# Patient Record
Sex: Female | Born: 1935
Health system: Southern US, Community
[De-identification: ages and names within clinical notes are randomized; demographics above are authoritative.]

## PROBLEM LIST (undated history)

## (undated) DIAGNOSIS — E860 Dehydration: Secondary | ICD-10-CM

## (undated) DIAGNOSIS — K219 Gastro-esophageal reflux disease without esophagitis: Secondary | ICD-10-CM

## (undated) DIAGNOSIS — R6521 Severe sepsis with septic shock: Secondary | ICD-10-CM

## (undated) DIAGNOSIS — I10 Essential (primary) hypertension: Secondary | ICD-10-CM

## (undated) DIAGNOSIS — I499 Cardiac arrhythmia, unspecified: Secondary | ICD-10-CM

## (undated) DIAGNOSIS — G459 Transient cerebral ischemic attack, unspecified: Secondary | ICD-10-CM

## (undated) DIAGNOSIS — B3781 Candidal esophagitis: Secondary | ICD-10-CM

## (undated) DIAGNOSIS — K229 Disease of esophagus, unspecified: Secondary | ICD-10-CM

## (undated) DIAGNOSIS — R197 Diarrhea, unspecified: Secondary | ICD-10-CM

## (undated) DIAGNOSIS — Z9889 Other specified postprocedural states: Secondary | ICD-10-CM

## (undated) DIAGNOSIS — M810 Age-related osteoporosis without current pathological fracture: Secondary | ICD-10-CM

## (undated) DIAGNOSIS — K222 Esophageal obstruction: Secondary | ICD-10-CM

## (undated) DIAGNOSIS — K5720 Diverticulitis of large intestine with perforation and abscess without bleeding: Secondary | ICD-10-CM

## (undated) DIAGNOSIS — M1990 Unspecified osteoarthritis, unspecified site: Secondary | ICD-10-CM

## (undated) DIAGNOSIS — T8859XA Other complications of anesthesia, initial encounter: Secondary | ICD-10-CM

## (undated) DIAGNOSIS — A419 Sepsis, unspecified organism: Secondary | ICD-10-CM

## (undated) DIAGNOSIS — R112 Nausea with vomiting, unspecified: Secondary | ICD-10-CM

## (undated) DIAGNOSIS — T4145XA Adverse effect of unspecified anesthetic, initial encounter: Secondary | ICD-10-CM

## (undated) DIAGNOSIS — C801 Malignant (primary) neoplasm, unspecified: Secondary | ICD-10-CM

## (undated) HISTORY — DX: Transient cerebral ischemic attack, unspecified: G45.9

## (undated) HISTORY — DX: Malignant (primary) neoplasm, unspecified: C80.1

## (undated) HISTORY — DX: Severe sepsis with septic shock: R65.21

## (undated) HISTORY — DX: Diarrhea, unspecified: R19.7

## (undated) HISTORY — DX: Sepsis, unspecified organism: A41.9

## (undated) HISTORY — DX: Age-related osteoporosis without current pathological fracture: M81.0

## (undated) HISTORY — PX: PAROTIDECTOMY W/ NECK DISSECTION TOTAL: SUR1004

## (undated) HISTORY — PX: TONSILLECTOMY: SUR1361

## (undated) HISTORY — DX: Disease of esophagus, unspecified: K22.9

## (undated) HISTORY — PX: ABDOMINAL HYSTERECTOMY: SHX81

## (undated) HISTORY — PX: APPENDECTOMY: SHX54

## (undated) HISTORY — DX: Essential (primary) hypertension: I10

## (undated) HISTORY — DX: Candidal esophagitis: B37.81

## (undated) HISTORY — DX: Unspecified osteoarthritis, unspecified site: M19.90

## (undated) HISTORY — DX: Dehydration: E86.0

## (undated) HISTORY — DX: Diverticulitis of large intestine with perforation and abscess without bleeding: K57.20

---

## 1997-09-04 ENCOUNTER — Ambulatory Visit (HOSPITAL_COMMUNITY): Admission: RE | Admit: 1997-09-04 | Discharge: 1997-09-04 | Payer: Self-pay | Admitting: Ophthalmology

## 1997-09-20 ENCOUNTER — Other Ambulatory Visit: Admission: RE | Admit: 1997-09-20 | Discharge: 1997-09-20 | Payer: Self-pay | Admitting: Obstetrics & Gynecology

## 1998-10-04 ENCOUNTER — Encounter: Payer: Self-pay | Admitting: Obstetrics and Gynecology

## 1998-10-04 ENCOUNTER — Ambulatory Visit (HOSPITAL_COMMUNITY): Admission: RE | Admit: 1998-10-04 | Discharge: 1998-10-04 | Payer: Self-pay | Admitting: Obstetrics and Gynecology

## 1999-04-15 ENCOUNTER — Encounter: Payer: Self-pay | Admitting: Obstetrics and Gynecology

## 1999-04-15 ENCOUNTER — Encounter: Admission: RE | Admit: 1999-04-15 | Discharge: 1999-04-15 | Payer: Self-pay | Admitting: Obstetrics and Gynecology

## 1999-08-16 ENCOUNTER — Other Ambulatory Visit: Admission: RE | Admit: 1999-08-16 | Discharge: 1999-08-16 | Payer: Self-pay | Admitting: Family Medicine

## 1999-10-01 ENCOUNTER — Encounter: Admission: RE | Admit: 1999-10-01 | Discharge: 1999-10-01 | Payer: Self-pay | Admitting: Family Medicine

## 1999-10-01 ENCOUNTER — Encounter: Payer: Self-pay | Admitting: Family Medicine

## 1999-12-09 ENCOUNTER — Encounter: Payer: Self-pay | Admitting: Gastroenterology

## 1999-12-09 ENCOUNTER — Ambulatory Visit (HOSPITAL_COMMUNITY): Admission: RE | Admit: 1999-12-09 | Discharge: 1999-12-09 | Payer: Self-pay | Admitting: Gastroenterology

## 2000-08-07 ENCOUNTER — Encounter: Admission: RE | Admit: 2000-08-07 | Discharge: 2000-08-07 | Payer: Self-pay | Admitting: Family Medicine

## 2000-08-07 ENCOUNTER — Encounter: Payer: Self-pay | Admitting: Family Medicine

## 2000-09-07 ENCOUNTER — Other Ambulatory Visit: Admission: RE | Admit: 2000-09-07 | Discharge: 2000-09-07 | Payer: Self-pay | Admitting: Obstetrics and Gynecology

## 2000-09-15 ENCOUNTER — Encounter: Payer: Self-pay | Admitting: Obstetrics and Gynecology

## 2000-09-15 ENCOUNTER — Encounter: Admission: RE | Admit: 2000-09-15 | Discharge: 2000-09-15 | Payer: Self-pay | Admitting: Obstetrics and Gynecology

## 2001-08-12 ENCOUNTER — Encounter: Admission: RE | Admit: 2001-08-12 | Discharge: 2001-08-12 | Payer: Self-pay | Admitting: Obstetrics and Gynecology

## 2001-08-12 ENCOUNTER — Encounter: Payer: Self-pay | Admitting: Obstetrics and Gynecology

## 2001-09-08 ENCOUNTER — Other Ambulatory Visit: Admission: RE | Admit: 2001-09-08 | Discharge: 2001-09-08 | Payer: Self-pay | Admitting: Obstetrics and Gynecology

## 2002-08-25 ENCOUNTER — Encounter: Payer: Self-pay | Admitting: Obstetrics and Gynecology

## 2002-08-25 ENCOUNTER — Encounter: Admission: RE | Admit: 2002-08-25 | Discharge: 2002-08-25 | Payer: Self-pay | Admitting: Obstetrics and Gynecology

## 2002-09-12 ENCOUNTER — Other Ambulatory Visit: Admission: RE | Admit: 2002-09-12 | Discharge: 2002-09-12 | Payer: Self-pay | Admitting: Obstetrics and Gynecology

## 2002-09-22 ENCOUNTER — Encounter: Admission: RE | Admit: 2002-09-22 | Discharge: 2002-09-22 | Payer: Self-pay | Admitting: Obstetrics and Gynecology

## 2002-09-22 ENCOUNTER — Encounter: Payer: Self-pay | Admitting: Obstetrics and Gynecology

## 2003-09-04 ENCOUNTER — Encounter: Admission: RE | Admit: 2003-09-04 | Discharge: 2003-09-04 | Payer: Self-pay | Admitting: Obstetrics and Gynecology

## 2003-10-25 ENCOUNTER — Encounter: Admission: RE | Admit: 2003-10-25 | Discharge: 2003-10-25 | Payer: Self-pay | Admitting: Gastroenterology

## 2003-11-21 ENCOUNTER — Ambulatory Visit (HOSPITAL_COMMUNITY): Admission: RE | Admit: 2003-11-21 | Discharge: 2003-11-21 | Payer: Self-pay | Admitting: Gastroenterology

## 2004-09-16 ENCOUNTER — Encounter: Admission: RE | Admit: 2004-09-16 | Discharge: 2004-09-16 | Payer: Self-pay | Admitting: Obstetrics and Gynecology

## 2005-09-19 ENCOUNTER — Encounter: Admission: RE | Admit: 2005-09-19 | Discharge: 2005-09-19 | Payer: Self-pay | Admitting: Obstetrics and Gynecology

## 2006-02-11 ENCOUNTER — Ambulatory Visit (HOSPITAL_COMMUNITY): Admission: RE | Admit: 2006-02-11 | Discharge: 2006-02-11 | Payer: Self-pay | Admitting: Gastroenterology

## 2006-10-07 ENCOUNTER — Encounter: Admission: RE | Admit: 2006-10-07 | Discharge: 2006-10-07 | Payer: Self-pay | Admitting: Obstetrics and Gynecology

## 2007-10-25 ENCOUNTER — Encounter: Admission: RE | Admit: 2007-10-25 | Discharge: 2007-10-25 | Payer: Self-pay | Admitting: Obstetrics and Gynecology

## 2008-10-25 ENCOUNTER — Encounter: Admission: RE | Admit: 2008-10-25 | Discharge: 2008-10-25 | Payer: Self-pay | Admitting: Obstetrics and Gynecology

## 2009-11-02 ENCOUNTER — Encounter: Admission: RE | Admit: 2009-11-02 | Discharge: 2009-11-02 | Payer: Self-pay | Admitting: Obstetrics and Gynecology

## 2010-04-14 ENCOUNTER — Encounter: Payer: Self-pay | Admitting: Gastroenterology

## 2010-07-15 ENCOUNTER — Ambulatory Visit (HOSPITAL_COMMUNITY)
Admission: RE | Admit: 2010-07-15 | Discharge: 2010-07-15 | Disposition: A | Discharge status: Home / Self Care | Payer: Medicare Other | Source: Ambulatory Visit | Attending: Gastroenterology | Admitting: Gastroenterology

## 2010-07-16 ENCOUNTER — Ambulatory Visit (HOSPITAL_COMMUNITY)
Admission: RE | Admit: 2010-07-16 | Discharge: 2010-07-16 | Disposition: A | Discharge status: Home / Self Care | Payer: Medicare Other | Source: Ambulatory Visit | Attending: Gastroenterology | Admitting: Gastroenterology

## 2010-07-16 DIAGNOSIS — K449 Diaphragmatic hernia without obstruction or gangrene: Secondary | ICD-10-CM | POA: Insufficient documentation

## 2010-07-16 DIAGNOSIS — K222 Esophageal obstruction: Secondary | ICD-10-CM | POA: Insufficient documentation

## 2010-07-16 DIAGNOSIS — R1310 Dysphagia, unspecified: Secondary | ICD-10-CM | POA: Insufficient documentation

## 2010-08-09 NOTE — Op Note (Signed)
NAME:  [name], [name]                         ACCOUNT NO.:  [id number]   MEDICAL RECORD NO.:  [id number]                   PATIENT TYPE:  AMB   LOCATION:  ENDO                                 FACILITY:  [facility]   PHYSICIAN:  Bernette Redbird, M.D.                DATE OF BIRTH:  1931-07-06   DATE OF PROCEDURE:  11/21/2003  DATE OF DISCHARGE:                                 OPERATIVE REPORT   PROCEDURE:  Upper endoscopy with Savary dilatation of the esophagus under  fluoroscopy.   INDICATIONS:  Recurrent dysphagia symptoms in a 77 year old female about  four years status post a previous esophageal dilatation and esophageal ring.   FINDINGS:  The esophageal ring was dilated to 18 mm by Savary technique.   PROCEDURE:  The nature, purposes, and risks of the procedure were familiar  to the patient from prior examination, and she provided written consent.  Sedation was fentanyl 25 mcg and Versed 2 mg IV without arrhythmias or  desaturation.  The Olympus video endoscope was passed under direct vision,  and the vocal cords looked grossly normal.  The esophagus was readily  entered and had entirely normal mucosa without evidence of free reflux,  reflux esophagitis, Barrett's esophagus, varices, infection, or neoplasia.  There was a well-defined esophageal ring at the squamocolumnar junction,  below which was a small hiatal hernia, perhaps 2 cm.  The ring was widely  patent and offered no resistance to passage of the 9-10 mm endoscope.  The  stomach contained no significant residual and had normal mucosa without  evidence of gastritis, erosions, ulcers, polyps, or masses.  This is  pertinent in that the patient is on both Plavix and aspirin.  The pylorus,  duodenal bulb, and second duodenum looked normal.   Savary dilatation was then performed in a standard fashion.  The spring tip  wire was passed through the scope into the antrum of the stomach, the scope  was removed in an exchange fashion,  fluoroscopic confirmation of appropriate  wire positioning was confirmed, and then sequential dilatation was performed  using the 16 and 18 mm Savary dilators, confirming passage of the widest  portion of the dilator along the level of the diaphragm fluoroscopically on  each occasion.  The guidewire was removed with a larger dilator, and the  patient was re-endoscoped under direct vision, which showed no evidence of  trauma to the esophagus or stomach.  The ring did not visibly appear  fractured.  The scope was removed from the patient.  She tolerated the  procedure well, and there were no apparent complications.   IMPRESSION:  Esophageal ring dilated to 18 mm, as described above.  (530.3).   PLAN:  Clinical followup of dysphagia symptoms.  Bernette Redbird, M.D.    RB/MEDQ  D:  11/21/2003  T:  11/21/2003  Job:  [id number]   cc:   Abner Greenspan, MD  [place], [place]

## 2010-08-09 NOTE — Procedures (Signed)
[place]  Patient:    [name], [name]                      MRN: [id number] Proc. Date: 12/09/99 Adm. Date:  [id number] Attending:  Rich Brave CC:         Leroy Sea., M.D.   Procedure Report  PROCEDURE PERFORMED:  Colonoscopy.  INDICATION:  Screening for colon cancer in this 77 year old female.  She last had colonoscopy seven years ago.  FINDINGS: Normal exam to the cecum except for moderate sigmoid diverticulosis and fixation.  DESCRIPTION OF PROCEDURE:  The nature, purpose and risks of the procedure had been reviewed with the patient who provided written consent.  Sedation for this procedure and the upper endoscopy which preceded it totaled Fentanyl 70 mcg and Versed 7 mg IV without arrhythmias or desaturation.  The Olympus pediatric adjustable tension video colonoscope was advanced around the colon with moderate difficulty, encountering some looping in the sigmoid which had to be taken out, and then some external abdominal compression was needed to advance to the cecum which was identified by the absence of further lumen and typical cecal appearance.  Pull back was then performed.  The quality of the prep was excellent and it was felt that all areas were well seen.  There was a little bit of film coating the cecal surface but this is not felt to have obscured any significant lesions.  There was moderate sigmoid mycosis, fixation and diverticulosis but this was otherwise a normal exam, without evidence of polyps, cancer, colitis or vascular malformations and retroflexion of the rectum was normal.  Digital exam at the start of the procedure showed perhaps slightly relaxed anal sphincter tone and a partially prolapsed internal hemorrhoid.  No biopsies were obtained during the procedure.  The patient tolerated it well and there were no apparent complications.  IMPRESSION:  Sigmoid diverticulosis, otherwise normal  exam.  PLAN:  Consider flexible sigmoidoscopy in five years and full colonoscopy in ten years. DD:  12/09/99 TD:  12/10/99 Job: [id number]

## 2010-08-09 NOTE — Procedures (Signed)
[place]. [facility]  Patient:    [name], [name]                      MRN: [id number] Proc. Date: 12/09/99 Adm. Date:  [id number] Attending:  Rich Brave CC:         Leroy Sea., M.D.  [phone]   Procedure Report  PROCEDURE PERFORMED:  Upper endoscopy with Savary dilatation of the esophagus under fluoroscopy.  ENDOSCOPIST:  Florencia Reasons, M.D.  INDICATIONS FOR PROCEDURE:  The patient is a 77 year old female with dysphagia symptoms.  FINDINGS:  Esophageal ring, dilated to 18 mm by Savary technique.  DESCRIPTION OF PROCEDURE:  The nature, purpose and risks of the procedure had been discussed with the patient, who provided written consent.  Sedation was fentanyl 50 mcg and Versed 5 mg IV without arrhythmias or desaturation with fairly deep sedation.  The Olympus small caliber adult video endoscope was passed under direct vision, entering the esophagus without difficulty. The vocal cords looked normal.  The esophagus was normal, without evidence of varices, infection, neoplasia, reflux esophagitis or Barretts esophagus.  Careful inspection of the squamocolumnar junction demonstrated a very fine esophageal ring, without any visible associated hiatal hernia.  This ring was fairly widely patent and did not offer any resistance to passage of the 9 mm endoscope.  The stomach was entered.  It contained a small bilious residual which was suctioned up but the gastric mucosa was without evidence of gastritis, erosions, polyps or masses including a retroflex view of the proximal stomach, which again failed to show any evidence of a hiatal hernia.  The pylorus, duodenal bulb and second duodenum looked normal.  Savary dilatation was then performed in the standard fashion.  The springtip guide wire was passed into the antrum of the stomach and, after fluoroscopic confirmation of appropriate positioning of the wire and with  fluoroscopic guidance confirming passage of the widest portion of the dilator below the level of the diaphragm, sequential passage was made with Savary dilators, sizes 16 and 18 mm encountering smooth resistance without any discrete "pop" or "click."  A larger dilator and the guide wire were removed and the patient was re-endoscoped under direct vision which showed fracture of the ring, including a small amount of fresh hemorrhage but no evidence of ongoing bleeding or any undue trauma to the stomach, esophagus or hypopharynx.  The patient tolerated the procedure well and there were no apparent complications.  IMPRESSION:  Esophageal ring dilated to 18 mm by Savary technique, otherwise normal exam.  PLAN:  Await clinical follow-up of dysphagia symptoms. DD:  12/09/99 TD:  12/10/99 Job: [id number]

## 2010-10-30 ENCOUNTER — Other Ambulatory Visit: Payer: Self-pay | Admitting: Obstetrics and Gynecology

## 2010-10-30 DIAGNOSIS — Z1231 Encounter for screening mammogram for malignant neoplasm of breast: Secondary | ICD-10-CM

## 2010-11-04 ENCOUNTER — Ambulatory Visit
Admission: RE | Admit: 2010-11-04 | Discharge: 2010-11-04 | Disposition: A | Discharge status: Home / Self Care | Payer: Medicare Other | Source: Ambulatory Visit | Attending: Obstetrics and Gynecology | Admitting: Obstetrics and Gynecology

## 2010-11-04 DIAGNOSIS — Z1231 Encounter for screening mammogram for malignant neoplasm of breast: Secondary | ICD-10-CM

## 2011-03-28 DIAGNOSIS — Z8673 Personal history of transient ischemic attack (TIA), and cerebral infarction without residual deficits: Secondary | ICD-10-CM | POA: Diagnosis not present

## 2011-03-28 DIAGNOSIS — R4789 Other speech disturbances: Secondary | ICD-10-CM | POA: Diagnosis not present

## 2011-04-11 DIAGNOSIS — M25569 Pain in unspecified knee: Secondary | ICD-10-CM | POA: Diagnosis not present

## 2011-04-11 DIAGNOSIS — M2240 Chondromalacia patellae, unspecified knee: Secondary | ICD-10-CM | POA: Diagnosis not present

## 2011-04-16 DIAGNOSIS — K222 Esophageal obstruction: Secondary | ICD-10-CM | POA: Diagnosis not present

## 2011-04-16 DIAGNOSIS — R1310 Dysphagia, unspecified: Secondary | ICD-10-CM | POA: Diagnosis not present

## 2011-04-24 DIAGNOSIS — I6789 Other cerebrovascular disease: Secondary | ICD-10-CM | POA: Diagnosis not present

## 2011-04-24 DIAGNOSIS — I10 Essential (primary) hypertension: Secondary | ICD-10-CM | POA: Diagnosis not present

## 2011-04-24 DIAGNOSIS — R4789 Other speech disturbances: Secondary | ICD-10-CM | POA: Diagnosis not present

## 2011-04-24 DIAGNOSIS — R29818 Other symptoms and signs involving the nervous system: Secondary | ICD-10-CM | POA: Diagnosis not present

## 2011-04-24 DIAGNOSIS — E785 Hyperlipidemia, unspecified: Secondary | ICD-10-CM | POA: Diagnosis not present

## 2011-04-24 DIAGNOSIS — I6350 Cerebral infarction due to unspecified occlusion or stenosis of unspecified cerebral artery: Secondary | ICD-10-CM | POA: Diagnosis not present

## 2011-04-24 DIAGNOSIS — I6529 Occlusion and stenosis of unspecified carotid artery: Secondary | ICD-10-CM | POA: Diagnosis not present

## 2011-04-24 DIAGNOSIS — Z79899 Other long term (current) drug therapy: Secondary | ICD-10-CM | POA: Diagnosis not present

## 2011-04-24 DIAGNOSIS — H538 Other visual disturbances: Secondary | ICD-10-CM | POA: Diagnosis not present

## 2011-05-28 DIAGNOSIS — R300 Dysuria: Secondary | ICD-10-CM | POA: Diagnosis not present

## 2011-05-28 DIAGNOSIS — N390 Urinary tract infection, site not specified: Secondary | ICD-10-CM | POA: Diagnosis not present

## 2011-05-28 DIAGNOSIS — N3946 Mixed incontinence: Secondary | ICD-10-CM | POA: Diagnosis not present

## 2011-05-28 DIAGNOSIS — R351 Nocturia: Secondary | ICD-10-CM | POA: Diagnosis not present

## 2011-05-28 DIAGNOSIS — R319 Hematuria, unspecified: Secondary | ICD-10-CM | POA: Diagnosis not present

## 2011-06-04 DIAGNOSIS — Z8673 Personal history of transient ischemic attack (TIA), and cerebral infarction without residual deficits: Secondary | ICD-10-CM | POA: Diagnosis not present

## 2011-06-04 DIAGNOSIS — Z7902 Long term (current) use of antithrombotics/antiplatelets: Secondary | ICD-10-CM | POA: Diagnosis not present

## 2011-06-04 DIAGNOSIS — R4789 Other speech disturbances: Secondary | ICD-10-CM | POA: Diagnosis not present

## 2011-06-04 DIAGNOSIS — R51 Headache: Secondary | ICD-10-CM | POA: Diagnosis not present

## 2011-06-04 DIAGNOSIS — R9409 Abnormal results of other function studies of central nervous system: Secondary | ICD-10-CM | POA: Diagnosis not present

## 2011-06-13 DIAGNOSIS — R319 Hematuria, unspecified: Secondary | ICD-10-CM | POA: Diagnosis not present

## 2011-06-13 DIAGNOSIS — N3946 Mixed incontinence: Secondary | ICD-10-CM | POA: Diagnosis not present

## 2011-06-23 DIAGNOSIS — R319 Hematuria, unspecified: Secondary | ICD-10-CM | POA: Diagnosis not present

## 2011-06-23 DIAGNOSIS — N3946 Mixed incontinence: Secondary | ICD-10-CM | POA: Diagnosis not present

## 2011-06-23 DIAGNOSIS — R300 Dysuria: Secondary | ICD-10-CM | POA: Diagnosis not present

## 2011-06-23 DIAGNOSIS — N3941 Urge incontinence: Secondary | ICD-10-CM | POA: Diagnosis not present

## 2011-06-23 DIAGNOSIS — R599 Enlarged lymph nodes, unspecified: Secondary | ICD-10-CM | POA: Diagnosis not present

## 2011-06-23 DIAGNOSIS — N390 Urinary tract infection, site not specified: Secondary | ICD-10-CM | POA: Diagnosis not present

## 2011-07-08 DIAGNOSIS — Z880 Allergy status to penicillin: Secondary | ICD-10-CM | POA: Diagnosis not present

## 2011-07-08 DIAGNOSIS — C801 Malignant (primary) neoplasm, unspecified: Secondary | ICD-10-CM

## 2011-07-08 DIAGNOSIS — Z8249 Family history of ischemic heart disease and other diseases of the circulatory system: Secondary | ICD-10-CM | POA: Diagnosis not present

## 2011-07-08 DIAGNOSIS — C8589 Other specified types of non-Hodgkin lymphoma, extranodal and solid organ sites: Secondary | ICD-10-CM | POA: Diagnosis not present

## 2011-07-08 DIAGNOSIS — Z886 Allergy status to analgesic agent status: Secondary | ICD-10-CM | POA: Diagnosis not present

## 2011-07-08 DIAGNOSIS — Z0389 Encounter for observation for other suspected diseases and conditions ruled out: Secondary | ICD-10-CM | POA: Diagnosis not present

## 2011-07-08 DIAGNOSIS — R599 Enlarged lymph nodes, unspecified: Secondary | ICD-10-CM | POA: Diagnosis not present

## 2011-07-08 DIAGNOSIS — Z833 Family history of diabetes mellitus: Secondary | ICD-10-CM | POA: Diagnosis not present

## 2011-07-08 DIAGNOSIS — Z7902 Long term (current) use of antithrombotics/antiplatelets: Secondary | ICD-10-CM | POA: Diagnosis not present

## 2011-07-08 DIAGNOSIS — C8595 Non-Hodgkin lymphoma, unspecified, lymph nodes of inguinal region and lower limb: Secondary | ICD-10-CM | POA: Diagnosis not present

## 2011-07-08 DIAGNOSIS — I10 Essential (primary) hypertension: Secondary | ICD-10-CM | POA: Diagnosis not present

## 2011-07-08 DIAGNOSIS — M1990 Unspecified osteoarthritis, unspecified site: Secondary | ICD-10-CM | POA: Diagnosis not present

## 2011-07-08 DIAGNOSIS — Z79899 Other long term (current) drug therapy: Secondary | ICD-10-CM | POA: Diagnosis not present

## 2011-07-08 DIAGNOSIS — R319 Hematuria, unspecified: Secondary | ICD-10-CM | POA: Diagnosis not present

## 2011-07-08 DIAGNOSIS — Z882 Allergy status to sulfonamides status: Secondary | ICD-10-CM | POA: Diagnosis not present

## 2011-07-08 DIAGNOSIS — Z90710 Acquired absence of both cervix and uterus: Secondary | ICD-10-CM | POA: Diagnosis not present

## 2011-07-08 DIAGNOSIS — K219 Gastro-esophageal reflux disease without esophagitis: Secondary | ICD-10-CM | POA: Diagnosis not present

## 2011-07-08 HISTORY — DX: Malignant (primary) neoplasm, unspecified: C80.1

## 2011-07-14 DIAGNOSIS — I10 Essential (primary) hypertension: Secondary | ICD-10-CM | POA: Diagnosis not present

## 2011-07-14 DIAGNOSIS — I6789 Other cerebrovascular disease: Secondary | ICD-10-CM | POA: Diagnosis not present

## 2011-07-15 DIAGNOSIS — R319 Hematuria, unspecified: Secondary | ICD-10-CM | POA: Diagnosis not present

## 2011-07-15 DIAGNOSIS — N3941 Urge incontinence: Secondary | ICD-10-CM | POA: Diagnosis not present

## 2011-07-15 DIAGNOSIS — R300 Dysuria: Secondary | ICD-10-CM | POA: Diagnosis not present

## 2011-07-15 DIAGNOSIS — R350 Frequency of micturition: Secondary | ICD-10-CM | POA: Diagnosis not present

## 2011-07-16 DIAGNOSIS — M810 Age-related osteoporosis without current pathological fracture: Secondary | ICD-10-CM | POA: Diagnosis not present

## 2011-07-16 DIAGNOSIS — C8589 Other specified types of non-Hodgkin lymphoma, extranodal and solid organ sites: Secondary | ICD-10-CM | POA: Diagnosis not present

## 2011-07-16 DIAGNOSIS — M159 Polyosteoarthritis, unspecified: Secondary | ICD-10-CM | POA: Diagnosis not present

## 2011-07-16 DIAGNOSIS — M1990 Unspecified osteoarthritis, unspecified site: Secondary | ICD-10-CM | POA: Diagnosis not present

## 2011-07-16 DIAGNOSIS — M7150 Other bursitis, not elsewhere classified, unspecified site: Secondary | ICD-10-CM | POA: Diagnosis not present

## 2011-07-21 ENCOUNTER — Telehealth: Payer: Self-pay | Admitting: Oncology

## 2011-07-21 NOTE — Telephone Encounter (Signed)
Referred by Dr. Tommie Ard Dx- Follicular Lymphoma

## 2011-07-21 NOTE — Telephone Encounter (Signed)
S/w the pt and she is aware of her new pt appt on 07/22/2011@10 :00am

## 2011-07-22 ENCOUNTER — Ambulatory Visit: Payer: Medicare Other

## 2011-07-22 ENCOUNTER — Telehealth: Payer: Self-pay | Admitting: Oncology

## 2011-07-22 ENCOUNTER — Ambulatory Visit (HOSPITAL_BASED_OUTPATIENT_CLINIC_OR_DEPARTMENT_OTHER): Payer: Medicare Other | Admitting: Oncology

## 2011-07-22 ENCOUNTER — Other Ambulatory Visit: Payer: Self-pay | Admitting: Medical Oncology

## 2011-07-22 ENCOUNTER — Other Ambulatory Visit (HOSPITAL_BASED_OUTPATIENT_CLINIC_OR_DEPARTMENT_OTHER): Payer: Medicare Other

## 2011-07-22 ENCOUNTER — Encounter: Payer: Self-pay | Admitting: Oncology

## 2011-07-22 VITALS — BP 114/64 | HR 51 | Temp 97.9°F | Ht 59.5 in | Wt 131.7 lb

## 2011-07-22 DIAGNOSIS — I739 Peripheral vascular disease, unspecified: Secondary | ICD-10-CM | POA: Diagnosis not present

## 2011-07-22 DIAGNOSIS — G459 Transient cerebral ischemic attack, unspecified: Secondary | ICD-10-CM | POA: Diagnosis not present

## 2011-07-22 DIAGNOSIS — C8280 Other types of follicular lymphoma, unspecified site: Secondary | ICD-10-CM | POA: Insufficient documentation

## 2011-07-22 DIAGNOSIS — C8290 Follicular lymphoma, unspecified, unspecified site: Secondary | ICD-10-CM

## 2011-07-22 DIAGNOSIS — K222 Esophageal obstruction: Secondary | ICD-10-CM | POA: Diagnosis not present

## 2011-07-22 DIAGNOSIS — C8299 Follicular lymphoma, unspecified, extranodal and solid organ sites: Secondary | ICD-10-CM | POA: Diagnosis not present

## 2011-07-22 DIAGNOSIS — C8295 Follicular lymphoma, unspecified, lymph nodes of inguinal region and lower limb: Secondary | ICD-10-CM

## 2011-07-22 DIAGNOSIS — M159 Polyosteoarthritis, unspecified: Secondary | ICD-10-CM

## 2011-07-22 LAB — COMPREHENSIVE METABOLIC PANEL
ALT: 16 U/L (ref 0–35)
AST: 22 U/L (ref 0–37)
BUN: 19 mg/dL (ref 6–23)
CO2: 26 mEq/L (ref 19–32)
Calcium: 9.3 mg/dL (ref 8.4–10.5)
Chloride: 106 mEq/L (ref 96–112)
Creatinine, Ser: 0.91 mg/dL (ref 0.50–1.10)
Glucose, Bld: 92 mg/dL (ref 70–99)
Sodium: 141 mEq/L (ref 135–145)
Total Bilirubin: 0.5 mg/dL (ref 0.3–1.2)

## 2011-07-22 LAB — CBC WITH DIFFERENTIAL/PLATELET
BASO%: 0.8 % (ref 0.0–2.0)
EOS%: 2.6 % (ref 0.0–7.0)
HCT: 39.4 % (ref 34.8–46.6)
NEUT#: 4.6 10*3/uL (ref 1.5–6.5)
NEUT%: 67.9 % (ref 38.4–76.8)
nRBC: 0 % (ref 0–0)

## 2011-07-22 NOTE — Telephone Encounter (Signed)
gve the pt her may 2013 appt calendar along with the ct scan/pet scan appt

## 2011-07-22 NOTE — Patient Instructions (Signed)
1. We discussed your pathology and staging  2. Staging scans set up to be done in the next week: CT chest/abdomen/pelvis /PET scan  3. We discussed the possiblilty of doing bone marrow biopsy and aspirate  4. I will see you back in 2 weeks (08/05/11)

## 2011-07-22 NOTE — Progress Notes (Signed)
Patient came in today as new patient with husband and son, patient was very pleasant, patient has two insurances patient did not need financial assistance at this time.

## 2011-07-22 NOTE — Progress Notes (Deleted)
[name] is an 78 y.o. female.    Chief Complaint  Patient presents with  . New Evaluation    lymphoma    HPI: ***  No past medical history on file.  No past surgical history on file.  No family history on file.   Social History:  does not have a smoking history on file. She does not have any smokeless tobacco history on file. Her alcohol and drug histories not on file.  Allergies:  Allergies  Allergen Reactions  . Codeine Hives  . Penicillins Hives  . Sulfa Antibiotics Hives     (Not in a hospital admission)  ROS ***  Physical Exam:  Blood pressure 114/64, pulse 51, temperature 97.9 F (36.6 C), temperature source Oral, height 4' 11.5" (1.511 m), weight 131 lb 11.2 oz (59.739 kg).  Sclerae unicteric Oropharynx clear No peripheral adenopathy Lungs no rales or rhonchi Heart regular rate and rhythm Abd benign MSK no focal spinal tenderness, no peripheral edema Neuro: nonfocal Breasts: ***  CBC Lab Results  Component Value Date   WBC 6.7 07/22/2011   HGB 13.1 07/22/2011   HCT 39.4 07/22/2011   MCV 92.8 07/22/2011   PLT 266 07/22/2011   CMP  No results found for this basename: na, k, cl, co2, glucose, bun, creatinine, calcium, prot, albumin, ast, alt, alkphos, bilitot, gfrnonaa, gfraa     No results found.   Assessment: *** Plan: ***  Drue Second, MD Medical/Oncology [facility] [phone] (beeper) [phone] (Office)  07/22/2011, 3:02 PM 07/22/2011, 3:02 PM

## 2011-07-23 ENCOUNTER — Encounter: Payer: Self-pay | Admitting: Oncology

## 2011-07-23 DIAGNOSIS — K229 Disease of esophagus, unspecified: Secondary | ICD-10-CM | POA: Insufficient documentation

## 2011-07-23 LAB — BETA 2 MICROGLOBULIN, SERUM: Beta-2 Microglobulin: 1.93 mg/L — ABNORMAL HIGH (ref 1.01–1.73)

## 2011-07-23 LAB — LACTATE DEHYDROGENASE: LDH: 188 U/L (ref 94–250)

## 2011-07-23 NOTE — Progress Notes (Signed)
[name] is an 78 y.o. female.  Chief Complaint  Patient presents with  . New Evaluation    lymphoma    HPI: 78 year old female with medical history significant for TIAs arthritis hypertension and esophageal strictures. Most recently patient presented to her urologist, Dr. Cleatrice Burke with complaints of dysuria and UTI. She went on to have CT of the abdomen and pelvis performed with and without contrast on 06/23/2011.Scans were performed at [facility] and I have a report. The CT revealed no urinary tract calculi or hydronephrosis there was noted to be small liver cysts spleen adrenal glands and pancreas were unremarkable there was no evidence of hydronephrosis. In the right inguinal region and external iliac region adenopathy was noted a right inguinal lymph node measured 3.1 x 1.9 cm the right external iliac node measured 2.4 x 1.7 cm both concerning for a primary neoplasm such as lymphoma. Because of this finding patient went on to have the performed of the lymph nodes on 07/08/2011. The pathology from [facility] showed a malignant B-cell lymphoma that was low grade, grade 1 or 2. In this were confirmed to by molecular pathology laboratory network in [facility] patient is now seen in medical oncology for discussion of treatment of low grade B cell lymphoma. Today she is accompanied by her husband.   On systems patient does tell me that she has had about a 10 pound weight loss but she also tells me that she's been having difficulty swallowing do to her esophageal strictures. She has been getting esophageal dilatation by Dr. Matthias Hughs. She has not had one for several years and she is uncertain if she wants to have another one performed. She also is complaining of having bilateral hip pain and tenderness. She has been seen for a very long time by the rheumatologist at [facility]. Most recently she did have cortisone injections performed. But she  tells me that usually the surgeons temporizing.Denies any nausea or vomiting she has not had any fevers or chills or night sweats. She has not noticed any other masses in her neck under her arms. She has no abdominal pain no constipation or diarrhea.She is ambulatory but she does use a cane as well as a walker at home to 2 her arthritis. She denies any hematuria hematochezia melena hemoptysis or hematemesis. She does have history of recurrent urinary tract infections thus this led to the diagnosis of lymphoma. The lymphadenopathy that was found was an incidental finding. The 14 point review of systems is negative.  Past Medical History  Diagnosis Date  . Cancer 07/08/11    follicular B cell lymphoma  . TIA (transient ischemic attack)   . Arthritis   . Hypertension   . Esophageal abnormality     strictures    Past Surgical History  Procedure Date  . Appendectomy   . Parotidectomy w/ neck dissection total   . Tonsillectomy     Family History  Problem Relation Age of Onset  . Cancer Mother 64    gastric  . Hypertension Father     heart disease  . Cancer Maternal Aunt 13    breast cancer  . Cancer Maternal Uncle 60    colon      Social History:  reports that she has never smoked. She has never used smokeless tobacco. She reports that she does not drink alcohol or use illicit drugs.  Allergies:  Allergies  Allergen Reactions  . Codeine Hives  .  Penicillins Hives  . Sulfa Antibiotics Hives     (Not in a hospital admission)  ROS:She has no headaches she does occasionally does complain of birth blurring of vision she has no nausea no vomiting no shortness of breath no chest pains no palpitations. She does have history of dizziness off-and-on she does have history of transient ischemic attacks she hasn't had total of 6. She has myalgias as well as arthralgias do to her osteoarthritis. She apparently also has bursitis. She denies having any bleeding problems she has no fevers  chills or drenching night sweats. Remainder of the 14 point review of systems as in the history of present illness.  Physical Exam:  Blood pressure 114/64, pulse 51, temperature 97.9 F (36.6 C), temperature source Oral, height 4' 11.5" (1.511 m), weight 131 lb 11.2 oz (59.739 kg).  General: Patient is awake alert female in no apparent distress. HEENT examination: EOMI PERRLA sclerae anicteric no conjunctival pallor oral mucosa is moist neck is supple there is a surgical scar noted on the right side from her previous neck dissection there is no palpable cervical supraclavicular or axillary adenopathy. Lungs: Clear to auscultation and percussion no rales or rhonchi cardiovascular: Regular rate rhythm no murmurs gallops or rubs abdomen: Soft nontender nondistended bowel sounds are present no hepatosplenomegaly or other masses. Extremities: +1 edema no clubbing or cyanosis. Lymph node survey: No cervical supraclavicular axillary or left inguinal adenopathy right inguinal area does show area of recent biopsy and there is thickening in that region. Skin: Patient does have some moles otherwise no abnormalities are found. Neuro: Patient is alert oriented strength is symmetrical in upper and lower extremities otherwise nonfocal. Joint/musculoskeletal: Patient does have tenderness on palpation at the hip joints exteriorly. Back: There is noted to be tenderness on palpation and percussion of the lower lumbosacral spine.  CBC Lab Results  Component Value Date   WBC 6.7 07/22/2011   HGB 13.1 07/22/2011   HCT 39.4 07/22/2011   MCV 92.8 07/22/2011   PLT 266 07/22/2011   CMP     Component Value Date/Time   NA 141 07/22/2011 1012   K 4.2 07/22/2011 1012   CL 106 07/22/2011 1012   CO2 26 07/22/2011 1012   GLUCOSE 92 07/22/2011 1012   BUN 19 07/22/2011 1012   CREATININE 0.91 07/22/2011 1012   CALCIUM 9.3 07/22/2011 1012   PROT 6.2 07/22/2011 1012   ALBUMIN 4.4 07/22/2011 1012   AST 22 07/22/2011 1012   ALT 16  07/22/2011 1012   ALKPHOS 58 07/22/2011 1012   BILITOT 0.5 07/22/2011 1012        Assessment: 78 year old female with  #1 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 Otherwise patient has no other evidence of abdominal lymphadenopathy. Patient is seen in medical oncology for discussion of treatment options  #2 patient with peripheral vascular disease with TIAs she is on Plavix for this.   #3 Esophageal strictures: requiring periodic dilatation   #4 Osteoarthritis: requiring periodic cortisone injections  Plan:   1 new diagnosis of low grade B cell follicular lymphoma with limited disease to the right inguinal region. Patient had a CT of the abdomen performed for dysuria and urinary tract infection. The finding of the inguinal lymph nodes being enlarged was an incidental finding. Patient really does not have any B. Symptoms. She does have a history of weight loss but that may be due to her esophageal issues which have been chronic and ongoing. Patient  and I and her husband had an extensive discussion about diagnosis and treatment of B cell click or lymphomas. They understand the pathophysiology. As well.  #2 we discussed how to approach the staging of this disease. At this time my recommendation was to proceed with CT of the chest as well as the neck. We would also do a PET scan. I have also ordered a CBC C. Matte beta 2 microglobulin and LDH. We also discussed the possibility of doing a bone marrow biopsy and aspirate.   #3 as far as treatment is concerned because patient remains asymptomatic and if she has limited disease I think we could certainly just watch at this time. I discussed that treatment would begin if patient began having symptoms of an enlarging lymphadenopathy weight loss B. Symptoms fevers night sweats. They have demonstrated understanding of this.   #4 I will plan on seeing the patient back after her staging studies. I understand  that they will be leaving for a wedding in about a week and therefore I have set her up to see me on May 14 in followup. Of course I am available to them sooner if need arises.  #5 all questions were answered today I gave them plan he of time to ask questions and they were answered to the best of my abilities. I spent 1-1/2 hours to face to face time in counseling and coordination of care.  CC:Dr. Buccini Dr. Juanetta Snow, MD Medical/Oncology [facility] [phone] (beeper) [phone] (Office)  07/23/2011, 9:28 AM 07/23/2011, 9:28 AM

## 2011-07-28 ENCOUNTER — Encounter: Payer: Self-pay | Admitting: Oncology

## 2011-07-28 ENCOUNTER — Telehealth: Payer: Self-pay | Admitting: Medical Oncology

## 2011-07-28 NOTE — Telephone Encounter (Signed)
Received call from patient, "I am due for a PET/CT scan tomorrow and I want to make sure that the insurance policy will cover this since I had a CT scan when I went to see my urologist."  Returned call to patient and directed patient to contact financial department.   Patient expressed understanding.

## 2011-07-28 NOTE — Progress Notes (Signed)
Patient call today she had a question about her insurance,because she is having a full body scan.I told her that she should be [place] because she has medicare and bcbs,and also she said she has a cancer policy so I told her when she start  Receiving bill we help her submit them to [employer].

## 2011-07-29 ENCOUNTER — Encounter (HOSPITAL_COMMUNITY): Payer: Self-pay

## 2011-07-29 ENCOUNTER — Encounter (HOSPITAL_COMMUNITY)
Admission: RE | Admit: 2011-07-29 | Discharge: 2011-07-29 | Disposition: A | Discharge status: Home / Self Care | Payer: Medicare Other | Source: Ambulatory Visit | Attending: Oncology | Admitting: Oncology

## 2011-07-29 DIAGNOSIS — C8589 Other specified types of non-Hodgkin lymphoma, extranodal and solid organ sites: Secondary | ICD-10-CM | POA: Diagnosis not present

## 2011-07-29 DIAGNOSIS — K7689 Other specified diseases of liver: Secondary | ICD-10-CM | POA: Diagnosis not present

## 2011-07-29 DIAGNOSIS — R599 Enlarged lymph nodes, unspecified: Secondary | ICD-10-CM | POA: Diagnosis not present

## 2011-07-29 DIAGNOSIS — I517 Cardiomegaly: Secondary | ICD-10-CM | POA: Insufficient documentation

## 2011-07-29 DIAGNOSIS — R918 Other nonspecific abnormal finding of lung field: Secondary | ICD-10-CM | POA: Diagnosis not present

## 2011-07-29 DIAGNOSIS — K5730 Diverticulosis of large intestine without perforation or abscess without bleeding: Secondary | ICD-10-CM | POA: Diagnosis not present

## 2011-07-29 DIAGNOSIS — C8280 Other types of follicular lymphoma, unspecified site: Secondary | ICD-10-CM

## 2011-07-29 MED ORDER — IOHEXOL 300 MG/ML  SOLN
100.0000 mL | Freq: Once | INTRAMUSCULAR | Status: AC | PRN
  Administered 2011-07-29: 100 mL via INTRAVENOUS

## 2011-07-29 MED ORDER — FLUDEOXYGLUCOSE F - 18 (FDG) INJECTION
18.7000 | Freq: Once | INTRAVENOUS | Status: AC | PRN
  Administered 2011-07-29: 18.7 via INTRAVENOUS

## 2011-08-05 ENCOUNTER — Encounter (HOSPITAL_COMMUNITY): Payer: Self-pay | Admitting: *Deleted

## 2011-08-05 ENCOUNTER — Emergency Department (HOSPITAL_COMMUNITY): Payer: No Typology Code available for payment source

## 2011-08-05 ENCOUNTER — Ambulatory Visit (HOSPITAL_BASED_OUTPATIENT_CLINIC_OR_DEPARTMENT_OTHER): Payer: Medicare Other | Admitting: Oncology

## 2011-08-05 ENCOUNTER — Emergency Department (HOSPITAL_COMMUNITY)
Admission: EM | Admit: 2011-08-05 | Discharge: 2011-08-05 | Disposition: A | Discharge status: Home / Self Care | Payer: No Typology Code available for payment source | Attending: Emergency Medicine | Admitting: Emergency Medicine

## 2011-08-05 VITALS — BP 116/55 | HR 59 | Temp 97.3°F | Ht 59.0 in | Wt 129.0 lb

## 2011-08-05 DIAGNOSIS — M2550 Pain in unspecified joint: Secondary | ICD-10-CM

## 2011-08-05 DIAGNOSIS — IMO0001 Reserved for inherently not codable concepts without codable children: Secondary | ICD-10-CM

## 2011-08-05 DIAGNOSIS — C8590 Non-Hodgkin lymphoma, unspecified, unspecified site: Secondary | ICD-10-CM

## 2011-08-05 DIAGNOSIS — C8298 Follicular lymphoma, unspecified, lymph nodes of multiple sites: Secondary | ICD-10-CM | POA: Diagnosis not present

## 2011-08-05 DIAGNOSIS — Z8673 Personal history of transient ischemic attack (TIA), and cerebral infarction without residual deficits: Secondary | ICD-10-CM | POA: Insufficient documentation

## 2011-08-05 DIAGNOSIS — I10 Essential (primary) hypertension: Secondary | ICD-10-CM | POA: Insufficient documentation

## 2011-08-05 DIAGNOSIS — T1490XA Injury, unspecified, initial encounter: Secondary | ICD-10-CM | POA: Insufficient documentation

## 2011-08-05 DIAGNOSIS — M549 Dorsalgia, unspecified: Secondary | ICD-10-CM | POA: Insufficient documentation

## 2011-08-05 DIAGNOSIS — C8280 Other types of follicular lymphoma, unspecified site: Secondary | ICD-10-CM

## 2011-08-05 LAB — URINALYSIS, ROUTINE W REFLEX MICROSCOPIC
Bilirubin Urine: NEGATIVE
Glucose, UA: NEGATIVE mg/dL
Hgb urine dipstick: NEGATIVE
Specific Gravity, Urine: 1.017 (ref 1.005–1.030)
Urobilinogen, UA: 0.2 mg/dL (ref 0.0–1.0)
pH: 6 (ref 5.0–8.0)

## 2011-08-05 LAB — URINE MICROSCOPIC-ADD ON
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
## 2011-08-05 NOTE — ED Notes (Signed)
MD at bedside. 
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
## 2011-08-06 ENCOUNTER — Telehealth: Payer: Self-pay | Admitting: Medical Oncology

## 2011-08-06 NOTE — Telephone Encounter (Signed)
Per MD, notified patient that lab work from 4/30 CBC/CMET were normal.  Patient expressed understanding and has no further questions at this time

## 2011-08-06 NOTE — Telephone Encounter (Signed)
Message copied by Tylene Fantasia on Wed Aug 06, 2011  3:59 PM ------      Message from: Faith Rogue F      Created: Wed Aug 06, 2011  3:20 PM      Regarding: pt. call                   ----- Message -----         From: Victorino December, MD         Sent: 07/23/2011  10:13 AM           To: Ricky Stabs, RN            Please call patient: CBC/CMET normal

## 2011-08-10 ENCOUNTER — Encounter: Payer: Self-pay | Admitting: Oncology

## 2011-08-10 MED ORDER — ONDANSETRON HCL 8 MG PO TABS: ORAL_TABLET | ORAL | Status: DC

## 2011-08-10 MED ORDER — PROCHLORPERAZINE 25 MG RE SUPP: 25.0000 mg | Freq: Two times a day (BID) | RECTAL | Status: DC | PRN

## 2011-08-10 MED ORDER — LIDOCAINE-PRILOCAINE 2.5-2.5 % EX CREA: TOPICAL_CREAM | CUTANEOUS | Status: DC | PRN

## 2011-08-10 MED ORDER — PROCHLORPERAZINE MALEATE 10 MG PO TABS: 10.0000 mg | ORAL_TABLET | Freq: Four times a day (QID) | ORAL | Status: DC | PRN

## 2011-08-10 MED ORDER — PREDNISONE 20 MG PO TABS: 100.0000 mg/m2 | ORAL_TABLET | Freq: Every day | ORAL | Status: AC

## 2011-08-10 MED ORDER — LORAZEPAM 0.5 MG PO TABS: 0.5000 mg | ORAL_TABLET | Freq: Four times a day (QID) | ORAL | Status: DC | PRN

## 2011-08-11 ENCOUNTER — Encounter: Payer: Self-pay | Admitting: Oncology

## 2011-08-11 DIAGNOSIS — G459 Transient cerebral ischemic attack, unspecified: Secondary | ICD-10-CM | POA: Diagnosis not present

## 2011-08-11 NOTE — Progress Notes (Signed)
Patient call e this morning wanting to drop off her cancer policy from [facility],she is going to bring it on 08/20/11 when she go and have her port put in.

## 2011-08-13 ENCOUNTER — Encounter (HOSPITAL_COMMUNITY): Payer: Self-pay | Admitting: Pharmacy Technician

## 2011-08-14 ENCOUNTER — Other Ambulatory Visit: Payer: Medicare Other

## 2011-08-14 ENCOUNTER — Encounter: Payer: Self-pay | Admitting: *Deleted

## 2011-08-19 ENCOUNTER — Telehealth: Payer: Self-pay | Admitting: *Deleted

## 2011-08-19 ENCOUNTER — Telehealth (HOSPITAL_COMMUNITY): Payer: Self-pay | Admitting: Oncology

## 2011-08-19 ENCOUNTER — Other Ambulatory Visit: Payer: Self-pay | Admitting: Physician Assistant

## 2011-08-19 NOTE — Telephone Encounter (Signed)
Per staff message from [place] and Dr. Welton Flakes, ok to leave treatment on Friday 7/5. Also to move all treatments to Fridays. JMW

## 2011-08-20 ENCOUNTER — Other Ambulatory Visit: Payer: Self-pay | Admitting: Oncology

## 2011-08-20 ENCOUNTER — Ambulatory Visit (HOSPITAL_COMMUNITY)
Admission: RE | Admit: 2011-08-20 | Discharge: 2011-08-20 | Disposition: A | Discharge status: Home / Self Care | Payer: Medicare Other | Source: Ambulatory Visit | Attending: Oncology | Admitting: Oncology

## 2011-08-20 VITALS — BP 106/47 | HR 59 | Temp 97.5°F | Resp 11

## 2011-08-20 DIAGNOSIS — I10 Essential (primary) hypertension: Secondary | ICD-10-CM | POA: Insufficient documentation

## 2011-08-20 DIAGNOSIS — C8590 Non-Hodgkin lymphoma, unspecified, unspecified site: Secondary | ICD-10-CM

## 2011-08-20 DIAGNOSIS — C8589 Other specified types of non-Hodgkin lymphoma, extranodal and solid organ sites: Secondary | ICD-10-CM | POA: Diagnosis not present

## 2011-08-20 DIAGNOSIS — Z8673 Personal history of transient ischemic attack (TIA), and cerebral infarction without residual deficits: Secondary | ICD-10-CM | POA: Diagnosis not present

## 2011-08-20 DIAGNOSIS — Z5111 Encounter for antineoplastic chemotherapy: Secondary | ICD-10-CM | POA: Diagnosis not present

## 2011-08-20 LAB — CBC
Platelets: 273 10*3/uL (ref 150–400)
RBC: 4.62 MIL/uL (ref 3.87–5.11)
RDW: 12.7 % (ref 11.5–15.5)
WBC: 7.3 10*3/uL (ref 4.0–10.5)

## 2011-08-20 LAB — PROTIME-INR
INR: 0.89 (ref 0.00–1.49)
Prothrombin Time: 12.2 seconds (ref 11.6–15.2)

## 2011-08-20 MED ORDER — VANCOMYCIN HCL IN DEXTROSE 1-5 GM/200ML-% IV SOLN
1000.0000 mg | Freq: Once | INTRAVENOUS | Status: AC
  Administered 2011-08-20: 1000 mg via INTRAVENOUS

## 2011-08-20 MED ORDER — FENTANYL CITRATE 0.05 MG/ML IJ SOLN
INTRAMUSCULAR | Status: AC | PRN
  Administered 2011-08-20: 100 ug via INTRAVENOUS

## 2011-08-20 MED ORDER — SODIUM CHLORIDE 0.9 % IV SOLN: INTRAVENOUS | Status: DC

## 2011-08-20 MED ORDER — MIDAZOLAM HCL 5 MG/5ML IJ SOLN
INTRAMUSCULAR | Status: AC | PRN
  Administered 2011-08-20: 1 mg via INTRAVENOUS

## 2011-08-20 MED ORDER — MIDAZOLAM HCL 2 MG/2ML IJ SOLN
INTRAMUSCULAR | Status: AC
  Filled 2011-08-20: qty 2

## 2011-08-20 MED ORDER — HEPARIN SOD (PORK) LOCK FLUSH 100 UNIT/ML IV SOLN
500.0000 [IU] | Freq: Once | INTRAVENOUS | Status: AC
  Administered 2011-08-20: 500 [IU] via INTRAVENOUS

## 2011-08-20 MED ORDER — ONDANSETRON HCL 4 MG/2ML IJ SOLN
INTRAMUSCULAR | Status: AC
  Administered 2011-08-20: 4 mg via INTRAVENOUS
  Filled 2011-08-20: qty 2

## 2011-08-20 MED ORDER — VANCOMYCIN HCL IN DEXTROSE 1-5 GM/200ML-% IV SOLN
INTRAVENOUS | Status: AC
  Administered 2011-08-20: 1000 mg via INTRAVENOUS
  Filled 2011-08-20: qty 200

## 2011-08-20 MED ORDER — FENTANYL CITRATE 0.05 MG/ML IJ SOLN
INTRAMUSCULAR | Status: AC
  Filled 2011-08-20: qty 4

## 2011-08-20 MED ORDER — PROMETHAZINE HCL 25 MG/ML IJ SOLN: 6.2500 mg | Freq: Once | INTRAMUSCULAR | Status: DC

## 2011-08-20 MED ORDER — PROMETHAZINE HCL 25 MG/ML IJ SOLN
INTRAMUSCULAR | Status: AC
  Filled 2011-08-20: qty 1

## 2011-08-20 NOTE — Procedures (Signed)
Successful placement of right internal jugular approach port-a-cath with tip at superior aspect of the right atrium. The catheter is ready for immediate use. No immediate post procedural complications.  

## 2011-08-20 NOTE — H&P (Signed)
[name] is an 78 y.o. female.   Chief Complaint: " I'm here for a port a cath placement" HPI: Patient with history of B cell lymphoma presents today for port a cath placement for chemotherapy.  Past Medical History  Diagnosis Date  . Cancer 07/08/11    follicular B cell lymphoma  . TIA (transient ischemic attack)   . Arthritis   . Hypertension   . Esophageal abnormality     strictures    Past Surgical History  Procedure Date  . Appendectomy   . Parotidectomy w/ neck dissection total   . Tonsillectomy     Family History  Problem Relation Age of Onset  . Cancer Mother 63    gastric  . Hypertension Father     heart disease  . Cancer Maternal Aunt 75    breast cancer  . Cancer Maternal Uncle 53    colon   Social History:  reports that she has never smoked. She has never used smokeless tobacco. She reports that she does not drink alcohol or use illicit drugs.  Allergies:  Allergies  Allergen Reactions  . Codeine Hives  . Other Nausea And Vomiting    anesthesia  . Penicillins Hives  . Sulfa Antibiotics Hives    Current outpatient prescriptions:acetaminophen (TYLENOL) 500 MG tablet, Take 500 mg by mouth every 6 (six) hours as needed. For pain, Disp: , Rfl: ;  amLODipine-atorvastatin (CADUET) 5-40 MG per tablet, Take 1 tablet by mouth daily., Disp: , Rfl: ;  clopidogrel (PLAVIX) 75 MG tablet, Take 75 mg by mouth daily., Disp: , Rfl: ;  lidocaine-prilocaine (EMLA) cream, Apply topically as needed., Disp: 30 g, Rfl: 6 metoprolol (LOPRESSOR) 100 MG tablet, Take 100 mg by mouth 2 (two) times daily., Disp: , Rfl: ;  Multiple Vitamin (MULTIVITAMIN) tablet, Take 1 tablet by mouth daily., Disp: , Rfl: ;  ondansetron (ZOFRAN) 8 MG tablet, Take 1 tablet two times a day starting the day after chemo for 3 days. Then take 1 tablet two times a day as needed for nausea or vomiting., Disp: 30 tablet, Rfl: 1 pregabalin (LYRICA) 25 MG capsule, Take 25 mg by mouth at bedtime., Disp: , Rfl:  ;  prochlorperazine (COMPAZINE) 10 MG tablet, Take 1 tablet (10 mg total) by mouth every 6 (six) hours as needed (Nausea or vomiting)., Disp: 30 tablet, Rfl: 1;  prochlorperazine (COMPAZINE) 25 MG suppository, Place 1 suppository (25 mg total) rectally every 12 (twelve) hours as needed for nausea., Disp: 12 suppository, Rfl: 3 valsartan (DIOVAN) 160 MG tablet, Take 160 mg by mouth daily., Disp: , Rfl:  Current facility-administered medications:0.9 %  sodium chloride infusion, , Intravenous, Continuous, Simonne Come, MD;  vancomycin (VANCOCIN) IVPB 1000 mg/200 mL premix, 1,000 mg, Intravenous, Once, Simonne Come, MD   Results for orders placed during the hospital encounter of 08/20/11 (from the past 48 hour(s))  CBC     Status: Normal   Collection Time   08/20/11  9:17 AM      Component Value Range Comment   WBC 7.3  4.0 - 10.5 (K/uL)    RBC 4.62  3.87 - 5.11 (MIL/uL)    Hemoglobin 14.1  12.0 - 15.0 (g/dL)    HCT 14.7  82.9 - 56.2 (%)    MCV 89.8  78.0 - 100.0 (fL)    MCH 30.5  26.0 - 34.0 (pg)    MCHC 34.0  30.0 - 36.0 (g/dL)    RDW 13.0  86.5 - 78.4 (%)  Platelets 273  150 - 400 (K/uL)   PROTIME-INR     Status: Normal   Collection Time   08/20/11  9:17 AM      Component Value Range Comment   Prothrombin Time 12.2  11.6 - 15.2 (seconds)    INR 0.89  0.00 - 1.49     No results found.  Review of Systems  Constitutional: Negative for fever and chills.  Respiratory: Negative for cough and shortness of breath.   Cardiovascular: Negative for chest pain.  Gastrointestinal: Negative for abdominal pain.       Occ nausea, indigestion  Musculoskeletal: Positive for back pain.       Left shoulder discomfort  Neurological: Negative for headaches.  Endo/Heme/Allergies: Does not bruise/bleed easily.    Blood pressure 113/42, pulse 56, temperature 97.5 F (36.4 C), temperature source Oral, resp. rate 18, SpO2 98.00%. Physical Exam  Constitutional: She is oriented to person, place, and time.  She appears well-developed and well-nourished.  Cardiovascular: Regular rhythm.        bradycardic  Respiratory: Effort normal and breath sounds normal.  GI: Soft. Bowel sounds are normal. There is no tenderness.  Musculoskeletal: Normal range of motion. She exhibits edema.  Neurological: She is alert and oriented to person, place, and time.     Assessment/Plan Patient with B cell lymphoma; plan is for port a cath placement today for chemotherapy. Details/risks of procedure d/w pt/husband with their understanding and consent.  Brendon Christoffel,D KEVIN 08/20/2011, 10:21 AM

## 2011-08-20 NOTE — Discharge Instructions (Signed)

## 2011-09-04 ENCOUNTER — Ambulatory Visit (HOSPITAL_BASED_OUTPATIENT_CLINIC_OR_DEPARTMENT_OTHER): Payer: Medicare Other | Admitting: Oncology

## 2011-09-04 ENCOUNTER — Other Ambulatory Visit (HOSPITAL_BASED_OUTPATIENT_CLINIC_OR_DEPARTMENT_OTHER): Payer: Medicare Other | Admitting: Lab

## 2011-09-04 ENCOUNTER — Other Ambulatory Visit: Payer: Self-pay | Admitting: Certified Registered Nurse Anesthetist

## 2011-09-04 ENCOUNTER — Encounter: Payer: Self-pay | Admitting: Oncology

## 2011-09-04 VITALS — BP 117/65 | HR 52 | Temp 97.5°F | Ht 59.0 in | Wt 127.2 lb

## 2011-09-04 DIAGNOSIS — R52 Pain, unspecified: Secondary | ICD-10-CM | POA: Diagnosis not present

## 2011-09-04 DIAGNOSIS — M1990 Unspecified osteoarthritis, unspecified site: Secondary | ICD-10-CM

## 2011-09-04 DIAGNOSIS — C8299 Follicular lymphoma, unspecified, extranodal and solid organ sites: Secondary | ICD-10-CM

## 2011-09-04 DIAGNOSIS — C8280 Other types of follicular lymphoma, unspecified site: Secondary | ICD-10-CM

## 2011-09-04 HISTORY — DX: Unspecified osteoarthritis, unspecified site: M19.90

## 2011-09-04 LAB — CBC WITH DIFFERENTIAL/PLATELET
BASO%: 0.8 % (ref 0.0–2.0)
Basophils Absolute: 0.1 10*3/uL (ref 0.0–0.1)
HCT: 41.4 % (ref 34.8–46.6)
HGB: 13.6 g/dL (ref 11.6–15.9)
LYMPH%: 17.4 % (ref 14.0–49.7)
MCH: 30.3 pg (ref 25.1–34.0)
MCHC: 33 g/dL (ref 31.5–36.0)
MONO#: 0.6 10*3/uL (ref 0.1–0.9)
NEUT%: 69.8 % (ref 38.4–76.8)
Platelets: 280 10*3/uL (ref 145–400)
WBC: 6.8 10*3/uL (ref 3.9–10.3)
lymph#: 1.2 10*3/uL (ref 0.9–3.3)

## 2011-09-04 MED ORDER — TRAMADOL HCL 50 MG PO TABS: 50.0000 mg | ORAL_TABLET | Freq: Four times a day (QID) | ORAL | Status: DC | PRN

## 2011-09-04 MED ORDER — ALLOPURINOL 100 MG PO TABS: 100.0000 mg | ORAL_TABLET | Freq: Two times a day (BID) | ORAL | Status: DC

## 2011-09-04 NOTE — Progress Notes (Signed)
OFFICE PROGRESS NOTE  CC Dr. Tommie Ard Dr. 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  PRIOR THERAPY:  #1 patient had a PET/CT performed that revealed Upper normal sized axillary and subpectoral nodes, corresponding to hypermetabolism at PET.CT of the abdomen and pelvis revealed Pelvic adenopathy, consistent with active disease. Equivocal abdominal retroperitoneal findings. Borderline adenopathy without hypermetabolism superiorly. A small more inferior retroperitoneal node demonstrates mild nonspecific hypermetabolism at PET. Scattered liver lesions, technically too small to characterize  but most likely benign cysts or hemangiomas.  #2 patient will begin systemic chemotherapy as she is symptomatic from her lymphoma. I have recommended CVP Rituxan combination given every 21 days for a total of 4-6 cycles.  CURRENT THERAPY:to begin systemic chemotherapy consisting of CVP Rituxan starting on 09/04/2011.  INTERVAL HISTORY: [name] 78 y.o. female returns for followup visit To begin cycle 1 day 1 of CVP Rituxan. Risks and benefits of the treatment are discussed with the patient. Today she is accompanied by her husband only. Overall she does feel well but she does complain of having fullness in the lower abdomen as well as back pain. Her back pain has been ongoing and is chronic apparently. She otherwise denies any fevers chills night sweats she has no headaches. No bleeding problems no hematuria. No easy bruising remainder of the 10 point review of systems is negative. MEDICAL HISTORY: Past Medical History  Diagnosis Date  . Cancer 07/08/11    follicular B cell lymphoma  . TIA (transient ischemic attack)   . Arthritis   . Hypertension   . Esophageal abnormality     strictures  . Arthritis 09/04/2011    ALLERGIES:  is allergic to codeine; other; penicillins; and sulfa antibiotics.  MEDICATIONS:  Current Outpatient Prescriptions    Medication Sig Dispense Refill  . acetaminophen (TYLENOL) 500 MG tablet Take 500 mg by mouth every 6 (six) hours as needed. For pain      . amLODipine-atorvastatin (CADUET) 5-40 MG per tablet Take 1 tablet by mouth daily.      . clopidogrel (PLAVIX) 75 MG tablet Take 75 mg by mouth daily.      [name] lidocaine-prilocaine (EMLA) cream Apply topically as needed.  30 g  6  . metoprolol (LOPRESSOR) 100 MG tablet Take 100 mg by mouth 2 (two) times daily.      . Multiple Vitamin (MULTIVITAMIN) tablet Take 1 tablet by mouth daily.      . ondansetron (ZOFRAN) 8 MG tablet Take 1 tablet two times a day starting the day after chemo for 3 days. Then take 1 tablet two times a day as needed for nausea or vomiting.  30 tablet  1  . pregabalin (LYRICA) 25 MG capsule Take 25 mg by mouth at bedtime.      . prochlorperazine (COMPAZINE) 10 MG tablet Take 1 tablet (10 mg total) by mouth every 6 (six) hours as needed (Nausea or vomiting).  30 tablet  1  . prochlorperazine (COMPAZINE) 25 MG suppository Place 1 suppository (25 mg total) rectally every 12 (twelve) hours as needed for nausea.  12 suppository  3  . valsartan (DIOVAN) 160 MG tablet Take 160 mg by mouth daily.        SURGICAL HISTORY:  Past Surgical History  Procedure Date  . Appendectomy   . Parotidectomy w/ neck dissection total   . Tonsillectomy     REVIEW OF SYSTEMS:  Pertinent items are noted in HPI.   PHYSICAL  EXAMINATION: Lymph nodes: Cervical, supraclavicular, and axillary nodes normal. Cardio: regular rate and rhythm, S1, S2 normal, no murmur, click, rub or gallop GI: soft, non-tender; bowel sounds normal; no masses,  no organomegaly Extremities: extremities normal, atraumatic, no cyanosis or edema Neurologic: Grossly normal  ECOG PERFORMANCE STATUS: 1 - Symptomatic but completely ambulatory  Blood pressure 117/65, pulse 52, temperature 97.5 F (36.4 C), temperature source Oral, height 4\' 11"  (1.499 m), weight 127 lb 3.2 oz (57.698  kg).  LABORATORY DATA: Lab Results  Component Value Date   WBC 6.8 09/04/2011   HGB 13.6 09/04/2011   HCT 41.4 09/04/2011   MCV 91.9 09/04/2011   PLT 280 09/04/2011      Chemistry      Component Value Date/Time   NA 141 07/22/2011 1012   K 4.2 07/22/2011 1012   CL 106 07/22/2011 1012   CO2 26 07/22/2011 1012   BUN 19 07/22/2011 1012   CREATININE 0.91 07/22/2011 1012      Component Value Date/Time   CALCIUM 9.3 07/22/2011 1012   ALKPHOS 58 07/22/2011 1012   AST 22 07/22/2011 1012   ALT 16 07/22/2011 1012   BILITOT 0.5 07/22/2011 1012       RADIOGRAPHIC STUDIES:  Ct Chest W Contrast  07/29/2011  *RADIOLOGY REPORT*  Clinical Data:  New diagnosis of lymphoma.  Follicular low grade B- cell lymphoma. Clinically limited disease within the right inguinal region.  CT CHEST, ABDOMEN AND PELVIS WITH CONTRAST  Technique: Contiguous axial images of the chest abdomen and pelvis were obtained after IV contrast administration.  Contrast: 100  ml Omnipaque-300  Comparison: Today's PET, dictated separately.  No prior CTs. Clinic note of 07/22/2011 reviewed.  CT CHEST  Findings: Lung windows demonstrate biapical pleural parenchymal scarring, mild.  Minimal dependent opacity in the posterior left upper lobe on image 20.  Favor atelectasis or a small subpleural lymph node.  Soft tissue windows demonstrate no supraclavicular adenopathy. Axillary and subpectoral nodes, corresponding to hypermetabolism at PET. -  Index left axillary node measures 1.6 x 0.8 cm on image 19. - Right axillary index node measures 8 mm on image 22.  Tortuous descending thoracic aorta.  Mild cardiomegaly, without pericardial or pleural effusion. No central pulmonary embolism, on this non-dedicated study.  No mediastinal or hilar adenopathy. Small hiatal hernia.  Prominent left internal mammary node at 5 mm is not hypermetabolic.  IMPRESSION:  1.  Upper normal sized axillary and subpectoral nodes, corresponding to hypermetabolism at PET. 2.  No  other evidence of active lymphoma within the chest.  CT ABDOMEN AND PELVIS  Findings:  Too small to characterize right hepatic lobe lesion on image 43.  Probable cysts in the subcapsular right lobe on image 50 and image 45.  Normal spleen.  The proximal stomach is underdistended.  Apparent wall thickening is likely secondary on image 50.  The descending duodenum is either prominent or has a diverticulum arising from. Example image 55.  No cause identified.  Similar on the PET of same date.  Normal pancreas, gallbladder, biliary tract, adrenal glands.  Too small to characterize right renal lesion.  Normal left kidney.  A 5 mm aortocaval node on image 74 corresponds to a vague hypermetabolism on PET.  Borderline enlarged left periaortic node measures 1.0 cm on image 62, but is not hypermetabolic at PET.  Muscular hypertrophy secondary to sigmoid diverticulosis.  Normal terminal ileum.  Normal small bowel, without ascites or mesenteric adenopathy.  Right external iliac node measures 3.3  x 1.9 cm on image 102. - Index right inguinal node measures 3.4 x 2.4 cm on image 107. - Index left inguinal node measures 1.1 cm on image 103.  Normal urinary bladder.  Hysterectomy.  Both ovaries are upper normal in size, without well-defined lesion or hypermetabolism on today's PET. No significant free fluid.  Spondylosis is marked at L2-L3 with trace retrolisthesis of L2 on L3.  Trace L4-L5 anterolisthesis.  IMPRESSION:  1.  Pelvic adenopathy, consistent with active disease. 2.  Equivocal abdominal retroperitoneal findings.  Borderline adenopathy without hypermetabolism superiorly.  A small more inferior retroperitoneal node demonstrates mild nonspecific hypermetabolism at PET.  Recommend attention on follow-up. 3.  Scattered liver lesions, technically too small to characterize but most likely benign cysts or hemangiomas.  Original Report Authenticated By: Consuello Bossier, M.D.   Ct Abdomen Pelvis W Contrast  07/29/2011  *RADIOLOGY  REPORT*  Clinical Data:  New diagnosis of lymphoma.  Follicular low grade B- cell lymphoma. Clinically limited disease within the right inguinal region.  CT CHEST, ABDOMEN AND PELVIS WITH CONTRAST  Technique: Contiguous axial images of the chest abdomen and pelvis were obtained after IV contrast administration.  Contrast: 100  ml Omnipaque-300  Comparison: Today's PET, dictated separately.  No prior CTs. Clinic note of 07/22/2011 reviewed.  CT CHEST  Findings: Lung windows demonstrate biapical pleural parenchymal scarring, mild.  Minimal dependent opacity in the posterior left upper lobe on image 20.  Favor atelectasis or a small subpleural lymph node.  Soft tissue windows demonstrate no supraclavicular adenopathy. Axillary and subpectoral nodes, corresponding to hypermetabolism at PET. -  Index left axillary node measures 1.6 x 0.8 cm on image 19. - Right axillary index node measures 8 mm on image 22.  Tortuous descending thoracic aorta.  Mild cardiomegaly, without pericardial or pleural effusion. No central pulmonary embolism, on this non-dedicated study.  No mediastinal or hilar adenopathy. Small hiatal hernia.  Prominent left internal mammary node at 5 mm is not hypermetabolic.  IMPRESSION:  1.  Upper normal sized axillary and subpectoral nodes, corresponding to hypermetabolism at PET. 2.  No other evidence of active lymphoma within the chest.  CT ABDOMEN AND PELVIS  Findings:  Too small to characterize right hepatic lobe lesion on image 43.  Probable cysts in the subcapsular right lobe on image 50 and image 45.  Normal spleen.  The proximal stomach is underdistended.  Apparent wall thickening is likely secondary on image 50.  The descending duodenum is either prominent or has a diverticulum arising from. Example image 55.  No cause identified.  Similar on the PET of same date.  Normal pancreas, gallbladder, biliary tract, adrenal glands.  Too small to characterize right renal lesion.  Normal left kidney.  A 5  mm aortocaval node on image 74 corresponds to a vague hypermetabolism on PET.  Borderline enlarged left periaortic node measures 1.0 cm on image 62, but is not hypermetabolic at PET.  Muscular hypertrophy secondary to sigmoid diverticulosis.  Normal terminal ileum.  Normal small bowel, without ascites or mesenteric adenopathy.  Right external iliac node measures 3.3 x 1.9 cm on image 102. - Index right inguinal node measures 3.4 x 2.4 cm on image 107. - Index left inguinal node measures 1.1 cm on image 103.  Normal urinary bladder.  Hysterectomy.  Both ovaries are upper normal in size, without well-defined lesion or hypermetabolism on today's PET. No significant free fluid.  Spondylosis is marked at L2-L3 with trace retrolisthesis of L2 on L3.  Trace L4-L5 anterolisthesis.  IMPRESSION:  1.  Pelvic adenopathy, consistent with active disease. 2.  Equivocal abdominal retroperitoneal findings.  Borderline adenopathy without hypermetabolism superiorly.  A small more inferior retroperitoneal node demonstrates mild nonspecific hypermetabolism at PET.  Recommend attention on follow-up. 3.  Scattered liver lesions, technically too small to characterize but most likely benign cysts or hemangiomas.  Original Report Authenticated By: Consuello Bossier, M.D.   Nm Pet Image Initial (pi) Skull Base To Thigh  07/29/2011  *RADIOLOGY REPORT*  Clinical Data: Initial treatment strategy for new diagnosis of follicular low grade B-cell lymphoma.  Diagnosed in the right inguinal region.  History of arthritis.  NUCLEAR MEDICINE PET CT SKULL BASE TO THIGH  Technique:  Technique:  18.7 mCi F-18 FDG was injected intravenously. CT data was obtained and used for attenuation correction and anatomic localization only.  (This was not acquired as a diagnostic CT examination.) Additional exam technical data entered on technologist worksheet.  Comparison: Diagnostic CTs, dictated separately.  No prior PET.  Findings: Neck: No abnormal hypermetabolism.   Chest:  Bilateral axillary and subpectoral hypermetabolic adenopathy.  9 mm left axillary node measures a S.U.V. max of 5.7 on image 74.  Abdomen/Pelvis:  Equivocal aortocaval node.  This measures 5mm and a S.U.V. max of 2.8 on image 156.  Right-sided pelvic sidewall hypermetabolic adenopathy.  Index nodal mass measures 1.9 cm and a S.U.V. max of 14.8 on image 201.  Right greater than left hypermetabolic inguinal adenopathy.  Index right inguinal nodal mass measures 2.4 x 3.3 cm and a S.U.V. max of 16.6 on image 208.  [place]:  Vague hypermetabolism within the right side of the sternum is without CT correlate and likely physiologic.  CT images performed for attenuation correction demonstrate no cervical adenopathy.  Chest, abdomen, and pelvic findings deferred to today's diagnostic CT, dictated separately.  IMPRESSION:  1.  Hypermetabolic active lymphoma within the pelvis and axillas/subpectoral regions, as described. 2.  Equivocal lower abdominal/retroperitoneal node. Recommend attention on follow-up.  Original Report Authenticated By: Consuello Bossier, M.D.    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 She is status post staging scans which does reveals disease in the chest axilla as well as pelvic region. Patient is also symptomatic with abdominal and pelvic pain.  #2 I have discussed the scan results with the patient and her daughter-in-law and husband as well as her son Dr. Allyne Gee a [facility]. After an extensive discussion with everyone it was felt important that we begin patient with systemic treatment hopefully this will help with her pain especially her bowel and bladder issues. Risks and benefits of treatment were discussed with the patient. And they have all been demonstrated understanding and concur that we will treat her.   PLAN:  #1 proceed with patient's chemotherapy consisting of CVP and Rituxan. She will receive day 2  Neulasta. Risks and benefits of treatment were discussed with them.  #2 patient will return in one week's time for followup and labs.  #3 patient is having significant pain and we did recommend starting her on pain medications.   All questions were answered. The patient knows to call the clinic with any problems, questions or concerns. We can certainly see the patient much sooner if necessary.  I spent 30 minutes counseling the patient face to face. The total time spent in the appointment was 30 minutes.    Drue Second, MD Medical/Oncology [facility] [phone] (beeper)  [phone] (Office)  09/04/2011, 1:00 PM

## 2011-09-05 ENCOUNTER — Other Ambulatory Visit: Payer: Self-pay | Admitting: Oncology

## 2011-09-05 ENCOUNTER — Ambulatory Visit: Payer: No Typology Code available for payment source | Admitting: Family

## 2011-09-05 ENCOUNTER — Telehealth: Payer: Self-pay | Admitting: *Deleted

## 2011-09-05 ENCOUNTER — Ambulatory Visit (HOSPITAL_BASED_OUTPATIENT_CLINIC_OR_DEPARTMENT_OTHER): Payer: Medicare Other

## 2011-09-05 ENCOUNTER — Ambulatory Visit: Payer: No Typology Code available for payment source

## 2011-09-05 ENCOUNTER — Other Ambulatory Visit: Payer: No Typology Code available for payment source | Admitting: Lab

## 2011-09-05 VITALS — BP 95/55 | HR 67 | Temp 98.1°F

## 2011-09-05 DIAGNOSIS — C8590 Non-Hodgkin lymphoma, unspecified, unspecified site: Secondary | ICD-10-CM

## 2011-09-05 DIAGNOSIS — C8280 Other types of follicular lymphoma, unspecified site: Secondary | ICD-10-CM

## 2011-09-05 DIAGNOSIS — Z5111 Encounter for antineoplastic chemotherapy: Secondary | ICD-10-CM | POA: Diagnosis not present

## 2011-09-05 DIAGNOSIS — C8299 Follicular lymphoma, unspecified, extranodal and solid organ sites: Secondary | ICD-10-CM

## 2011-09-05 LAB — COMPREHENSIVE METABOLIC PANEL
BUN: 19 mg/dL (ref 6–23)
CO2: 27 mEq/L (ref 19–32)
Calcium: 9.8 mg/dL (ref 8.4–10.5)
Chloride: 104 mEq/L (ref 96–112)
Creatinine, Ser: 0.97 mg/dL (ref 0.50–1.10)
Glucose, Bld: 91 mg/dL (ref 70–99)
Total Bilirubin: 0.6 mg/dL (ref 0.3–1.2)

## 2011-09-05 LAB — URIC ACID: Uric Acid, Serum: 4.8 mg/dL (ref 2.4–7.0)

## 2011-09-05 LAB — LACTATE DEHYDROGENASE: LDH: 195 U/L (ref 94–250)

## 2011-09-05 LAB — HEPATITIS B CORE ANTIBODY, IGM: Hep B C IgM: NEGATIVE

## 2011-09-05 LAB — HEPATITIS B SURFACE ANTIBODY,QUALITATIVE: Hep B S Ab: NEGATIVE

## 2011-09-05 MED ORDER — ACETAMINOPHEN 325 MG PO TABS
650.0000 mg | ORAL_TABLET | Freq: Once | ORAL | Status: AC
  Administered 2011-09-05: 650 mg via ORAL

## 2011-09-05 MED ORDER — SODIUM CHLORIDE 0.9 % IV SOLN
Freq: Once | INTRAVENOUS | Status: AC
  Administered 2011-09-05: 09:00:00 via INTRAVENOUS

## 2011-09-05 MED ORDER — HEPARIN SOD (PORK) LOCK FLUSH 100 UNIT/ML IV SOLN
500.0000 [IU] | Freq: Once | INTRAVENOUS | Status: AC | PRN
  Administered 2011-09-05: 500 [IU]
  Filled 2011-09-05: qty 5

## 2011-09-05 MED ORDER — DIPHENHYDRAMINE HCL 25 MG PO CAPS
50.0000 mg | ORAL_CAPSULE | Freq: Once | ORAL | Status: AC
  Administered 2011-09-05: 50 mg via ORAL

## 2011-09-05 MED ORDER — DEXAMETHASONE SODIUM PHOSPHATE 4 MG/ML IJ SOLN
20.0000 mg | Freq: Once | INTRAMUSCULAR | Status: AC
  Administered 2011-09-05: 20 mg via INTRAVENOUS

## 2011-09-05 MED ORDER — SODIUM CHLORIDE 0.9 % IV SOLN
800.0000 mg/m2 | Freq: Once | INTRAVENOUS | Status: AC
  Administered 2011-09-05: 1240 mg via INTRAVENOUS
  Filled 2011-09-05: qty 62

## 2011-09-05 MED ORDER — ONDANSETRON 16 MG/50ML IVPB (CHCC)
16.0000 mg | Freq: Once | INTRAVENOUS | Status: AC
  Administered 2011-09-05: 16 mg via INTRAVENOUS

## 2011-09-05 MED ORDER — SODIUM CHLORIDE 0.9 % IV SOLN
375.0000 mg/m2 | Freq: Once | INTRAVENOUS | Status: AC
  Administered 2011-09-05: 600 mg via INTRAVENOUS
  Filled 2011-09-05: qty 60

## 2011-09-05 MED ORDER — SODIUM CHLORIDE 0.9 % IJ SOLN
10.0000 mL | INTRAMUSCULAR | Status: DC | PRN
  Administered 2011-09-05: 10 mL
  Filled 2011-09-05: qty 10

## 2011-09-05 MED ORDER — VINCRISTINE SULFATE CHEMO INJECTION 1 MG/ML
2.0000 mg | Freq: Once | INTRAVENOUS | Status: AC
  Administered 2011-09-05: 2 mg via INTRAVENOUS
  Filled 2011-09-05: qty 2

## 2011-09-05 NOTE — Patient Instructions (Addendum)
[facility] Discharge Instructions for Patients Receiving Chemotherapy  Today you received the following chemotherapy agents Vincristine, Cytoxan and Rituxan.  To help prevent nausea and vomiting after your treatment, we encourage you to take your nausea medication as ordered per MD.    If you develop nausea and vomiting that is not controlled by your nausea medication, call the clinic. If it is after clinic hours your family physician or the after hours number for the clinic or go to the Emergency Department.   BELOW ARE SYMPTOMS THAT SHOULD BE REPORTED IMMEDIATELY:  *FEVER GREATER THAN 100.5 F  *CHILLS WITH OR WITHOUT FEVER  NAUSEA AND VOMITING THAT IS NOT CONTROLLED WITH YOUR NAUSEA MEDICATION  *UNUSUAL SHORTNESS OF BREATH  *UNUSUAL BRUISING OR BLEEDING  TENDERNESS IN MOUTH AND THROAT WITH OR WITHOUT PRESENCE OF ULCERS  *URINARY PROBLEMS  *BOWEL PROBLEMS  UNUSUAL RASH Items with * indicate a potential emergency and should be followed up as soon as possible.  One of the nurses will contact you 24 hours after your treatment. Please let the nurse know about any problems that you may have experienced. Feel free to call the clinic you have any questions or concerns. The clinic phone number is [phone].   I have been informed and understand all the instructions given to me. I know to contact the clinic, my physician, or go to the Emergency Department if any problems should occur. I do not have any questions at this time, but understand that I may call the clinic during office hours   should I have any questions or need assistance in obtaining follow up care.    __________________________________________  _____________  __________ Signature of Patient or Authorized Representative            Date                   Time    __________________________________________ Nurse's Signature

## 2011-09-05 NOTE — Telephone Encounter (Signed)
Per staff message I have scheduled appts. JMW  

## 2011-09-06 ENCOUNTER — Ambulatory Visit (HOSPITAL_BASED_OUTPATIENT_CLINIC_OR_DEPARTMENT_OTHER): Payer: No Typology Code available for payment source

## 2011-09-06 VITALS — BP 125/71 | HR 70 | Temp 97.8°F

## 2011-09-06 DIAGNOSIS — C8295 Follicular lymphoma, unspecified, lymph nodes of inguinal region and lower limb: Secondary | ICD-10-CM | POA: Diagnosis not present

## 2011-09-06 DIAGNOSIS — Z5189 Encounter for other specified aftercare: Secondary | ICD-10-CM

## 2011-09-06 DIAGNOSIS — C8280 Other types of follicular lymphoma, unspecified site: Secondary | ICD-10-CM

## 2011-09-06 MED ORDER — PEGFILGRASTIM INJECTION 6 MG/0.6ML
6.0000 mg | Freq: Once | SUBCUTANEOUS | Status: AC
  Administered 2011-09-06: 6 mg via SUBCUTANEOUS

## 2011-09-08 ENCOUNTER — Encounter: Payer: Self-pay | Admitting: Pharmacist

## 2011-09-08 ENCOUNTER — Telehealth: Payer: Self-pay | Admitting: *Deleted

## 2011-09-08 NOTE — Telephone Encounter (Signed)
Message copied by Augusto Garbe on Mon Sep 08, 2011  2:48 PM ------      Message from: Margaretha Seeds      Created: Fri Sep 05, 2011  3:01 PM      Regarding: Chemo f/u call      Contact: [phone]       First time Rituxan, Vincristine and Cytoxan.  Dr. Welton Flakes

## 2011-09-08 NOTE — Telephone Encounter (Signed)
Patient is having some constipation.  Has sent family to the store to get things described in the "Eating Hints Book".  Has never had a problem with constipation.  Asked that she call if these don't work because she doesn't want to go more than three days without a BM.  Encouraged to drink at least 64 oz of water or more due to bowels.  Denies questions.  Denies pain with neulasta injection so far.

## 2011-09-10 ENCOUNTER — Telehealth: Payer: Self-pay | Admitting: *Deleted

## 2011-09-10 ENCOUNTER — Encounter: Payer: Self-pay | Admitting: *Deleted

## 2011-09-10 NOTE — Telephone Encounter (Signed)
Pt called inquired about her appt with MD on 6/21. No appts at this time in system for 6/21.  Pt also requested to see the financial counselor on this day. Pt states " I was told a couple of days ago I had an appt 6/21,  but no one told me what time.  My son is flying in from [place] Thursday who is also a doctor and I ould like him to come with me to the appt.Friday 6/21 with MD" Pt also voiced concerns with constipation reviewed use of stool softner, or trying miralax to help with BM. Pt verbalized understanding, requested a call back today regarding the Friday appt.   Will review with MD

## 2011-09-10 NOTE — Telephone Encounter (Signed)
Called pt to confirm appt date/time for lab/MD/financial counseling. Pt thanked me for call. Verbalized she had recently spoke with a scheduler and would be here with her son at 1130 6/21. Pt denied needing further assistance

## 2011-09-10 NOTE — Telephone Encounter (Signed)
patient confirmed over the phone to the new date and time on 09-12-2011 starting at 11:30am patient confirmed over the phone the new date and time

## 2011-09-12 ENCOUNTER — Encounter: Payer: Self-pay | Admitting: Oncology

## 2011-09-12 ENCOUNTER — Ambulatory Visit: Payer: Medicare Other

## 2011-09-12 ENCOUNTER — Other Ambulatory Visit (HOSPITAL_BASED_OUTPATIENT_CLINIC_OR_DEPARTMENT_OTHER): Payer: Medicare Other | Admitting: Lab

## 2011-09-12 ENCOUNTER — Telehealth: Payer: Self-pay | Admitting: Oncology

## 2011-09-12 ENCOUNTER — Ambulatory Visit (HOSPITAL_BASED_OUTPATIENT_CLINIC_OR_DEPARTMENT_OTHER): Payer: Medicare Other | Admitting: Oncology

## 2011-09-12 VITALS — BP 128/77 | HR 62 | Temp 98.0°F | Ht 59.0 in | Wt 123.9 lb

## 2011-09-12 DIAGNOSIS — C8299 Follicular lymphoma, unspecified, extranodal and solid organ sites: Secondary | ICD-10-CM

## 2011-09-12 DIAGNOSIS — C8589 Other specified types of non-Hodgkin lymphoma, extranodal and solid organ sites: Secondary | ICD-10-CM

## 2011-09-12 DIAGNOSIS — C8590 Non-Hodgkin lymphoma, unspecified, unspecified site: Secondary | ICD-10-CM

## 2011-09-12 DIAGNOSIS — K5900 Constipation, unspecified: Secondary | ICD-10-CM | POA: Diagnosis not present

## 2011-09-12 DIAGNOSIS — M549 Dorsalgia, unspecified: Secondary | ICD-10-CM | POA: Diagnosis not present

## 2011-09-12 DIAGNOSIS — C8280 Other types of follicular lymphoma, unspecified site: Secondary | ICD-10-CM

## 2011-09-12 LAB — COMPREHENSIVE METABOLIC PANEL
AST: 16 U/L (ref 0–37)
Albumin: 4.1 g/dL (ref 3.5–5.2)
BUN: 18 mg/dL (ref 6–23)
CO2: 31 mEq/L (ref 19–32)
Calcium: 9.7 mg/dL (ref 8.4–10.5)
Chloride: 98 mEq/L (ref 96–112)
Potassium: 3.6 mEq/L (ref 3.5–5.3)

## 2011-09-12 LAB — CBC WITH DIFFERENTIAL/PLATELET
Basophils Absolute: 0 10*3/uL (ref 0.0–0.1)
EOS%: 1 % (ref 0.0–7.0)
Eosinophils Absolute: 0.1 10*3/uL (ref 0.0–0.5)
HCT: 42.6 % (ref 34.8–46.6)
HGB: 14.3 g/dL (ref 11.6–15.9)
MCH: 30.5 pg (ref 25.1–34.0)
MONO#: 3 10*3/uL — ABNORMAL HIGH (ref 0.1–0.9)
NEUT#: 4.3 10*3/uL (ref 1.5–6.5)
NEUT%: 52.4 % (ref 38.4–76.8)
RDW: 13.3 % (ref 11.2–14.5)
WBC: 8.3 10*3/uL (ref 3.9–10.3)
lymph#: 0.9 10*3/uL (ref 0.9–3.3)

## 2011-09-12 MED ORDER — TRAMADOL HCL 50 MG PO TABS: 50.0000 mg | ORAL_TABLET | Freq: Four times a day (QID) | ORAL | Status: AC | PRN

## 2011-09-12 MED ORDER — LACTULOSE 10 GM/15ML PO SOLN: 20.0000 g | Freq: Three times a day (TID) | ORAL | Status: DC

## 2011-09-12 MED ORDER — LACTULOSE 10 GM/15ML PO SOLN: 20.0000 g | Freq: Three times a day (TID) | ORAL | Status: AC

## 2011-09-12 NOTE — Telephone Encounter (Signed)
gve the pt her July,aug 2013 appt calendar. °

## 2011-09-12 NOTE — Addendum Note (Signed)
Addended by: Cooper Render on: 09/12/2011 03:18 PM   Modules accepted: Orders

## 2011-09-12 NOTE — Progress Notes (Signed)
OFFICE PROGRESS NOTE  CC Dr. Tommie Ard Dr. 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  PRIOR THERAPY:  #1 patient had a PET/CT performed that revealed Upper normal sized axillary and subpectoral nodes, corresponding to hypermetabolism at PET.CT of the abdomen and pelvis revealed Pelvic adenopathy, consistent with active disease. Equivocal abdominal retroperitoneal findings. Borderline adenopathy without hypermetabolism superiorly. A small more inferior retroperitoneal node demonstrates mild nonspecific hypermetabolism at PET. Scattered liver lesions, technically too small to characterize  but most likely benign cysts or hemangiomas.  #2 patient will begin systemic chemotherapy as she is symptomatic from her lymphoma. I have recommended CVP Rituxan combination given every 21 days for a total of 4-6 cycles. Started on 09/05/11  CURRENT THERAPY: Patient's is here for interim labs after receiving cycle 1 of CVP R. On 09/05/2011.  INTERVAL HISTORY: [name] 78 y.o. female returns for followup visit.She had a CBC performed that looks perfectly normal her white count hemoglobin and platelets are normal. She received chemotherapy last week she tolerated it very well. She did not have any nausea or vomiting. However she does complain of having constipation. She has tried milk of magnesia as well as trying  prune juice and Senokot and MiraLAX. In spite of that she continues to be constipated. Although after having milk of magnesia yesterday she did have a small bowel movement but it was very hard.He does complain of having some pressure in the chest off-and-on but no shortness of breath. She has no sweating no radiation of this pressure. Patient also complains of having pelvic and lower back pain which is chronic and ongoing but she is concerned. I had prescribed tramadol for her but she did not get it picked up do to codeine allergy. She has no  peripheral paresthesias she is ambulatory. She has no other myalgias. She does tell me that after she was taking the prednisone as part of her chemotherapy regimen she felt very well. Remainder of the 10 point review of systems is negative. MEDICAL HISTORY: Past Medical History  Diagnosis Date  . Cancer 07/08/11    follicular B cell lymphoma  . TIA (transient ischemic attack)   . Arthritis   . Hypertension   . Esophageal abnormality     strictures  . Arthritis 09/04/2011    ALLERGIES:  is allergic to codeine; other; penicillins; and sulfa antibiotics.  MEDICATIONS:  Current Outpatient Prescriptions  Medication Sig Dispense Refill  . acetaminophen (TYLENOL) 500 MG tablet Take 500 mg by mouth every 6 (six) hours as needed. For pain      . allopurinol (ZYLOPRIM) 100 MG tablet Take 1 tablet (100 mg total) by mouth 2 (two) times daily.  60 tablet  3  . amLODipine-atorvastatin (CADUET) 5-40 MG per tablet Take 1 tablet by mouth daily.      . clopidogrel (PLAVIX) 75 MG tablet Take 75 mg by mouth daily.      [name] lidocaine-prilocaine (EMLA) cream Apply topically as needed.  30 g  6  . metoprolol (LOPRESSOR) 100 MG tablet Take 100 mg by mouth 2 (two) times daily.      . Multiple Vitamin (MULTIVITAMIN) tablet Take 1 tablet by mouth daily.      . ondansetron (ZOFRAN) 8 MG tablet Take 1 tablet two times a day starting the day after chemo for 3 days. Then take 1 tablet two times a day as needed for nausea or vomiting.  30 tablet  1  .  pregabalin (LYRICA) 25 MG capsule Take 25 mg by mouth at bedtime.      . prochlorperazine (COMPAZINE) 10 MG tablet Take 1 tablet (10 mg total) by mouth every 6 (six) hours as needed (Nausea or vomiting).  30 tablet  1  . prochlorperazine (COMPAZINE) 25 MG suppository Place 1 suppository (25 mg total) rectally every 12 (twelve) hours as needed for nausea.  12 suppository  3  . traMADol (ULTRAM) 50 MG tablet Take 1 tablet (50 mg total) by mouth every 6 (six) hours as needed  for pain.  30 tablet  3  . valsartan (DIOVAN) 160 MG tablet Take 160 mg by mouth daily.      [name] lactulose (CHRONULAC) 10 GM/15ML solution Take 30 mLs (20 g total) by mouth 3 (three) times daily.  240 mL  4    SURGICAL HISTORY:  Past Surgical History  Procedure Date  . Appendectomy   . Parotidectomy w/ neck dissection total   . Tonsillectomy     REVIEW OF SYSTEMS:  Pertinent items are noted in HPI.   PHYSICAL EXAMINATION: Lymph nodes: Cervical, supraclavicular, and axillary nodes normal. Cardio: regular rate and rhythm, S1, S2 normal, no murmur, click, rub or gallop GI: soft, non-tender; bowel sounds normal; no masses,  no organomegaly Extremities: extremities normal, atraumatic, no cyanosis or edema Neurologic: Grossly normal  ECOG PERFORMANCE STATUS: 1 - Symptomatic but completely ambulatory  Blood pressure 128/77, pulse 62, temperature 98 F (36.7 C), temperature source Oral, height 4\' 11"  (1.499 m), weight 123 lb 14.4 oz (56.201 kg).  LABORATORY DATA: Lab Results  Component Value Date   WBC 8.3 09/12/2011   HGB 14.3 09/12/2011   HCT 42.6 09/12/2011   MCV 90.7 09/12/2011   PLT 172 09/12/2011      Chemistry      Component Value Date/Time   NA 140 09/04/2011 1218   K 4.5 09/04/2011 1218   CL 104 09/04/2011 1218   CO2 27 09/04/2011 1218   BUN 19 09/04/2011 1218   CREATININE 0.97 09/04/2011 1218      Component Value Date/Time   CALCIUM 9.8 09/04/2011 1218   ALKPHOS 65 09/04/2011 1218   AST 22 09/04/2011 1218   ALT 17 09/04/2011 1218   BILITOT 0.6 09/04/2011 1218       RADIOGRAPHIC STUDIES:    ASSESSMENT: 78 year old female with  #1 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 She is status post staging scans which does reveals disease in the chest axilla as well as pelvic region. Patient is also symptomatic with abdominal and pelvic pain.  #2 patient is status post cycle 1 of CVP/R with Neulasta. Overall she tolerated it very well.  #3 constipation  #4 lower back  pain   PLAN:  #1 patient will come back in 2 weeks' time for cycle #2 of chemotherapy. Overall I do think she's done well her counts are normal.  #2 for constipation I have recommended using lactulose and a prescription has been sent to her pharmacy.  #3 back pain she will pick up the trauma to call and take it at bedtime so she is able to get good night sleep and is not woken by her pain.  All questions were answered. The patient knows to call the clinic with any problems, questions or concerns. We can certainly see the patient much sooner if necessary.  I spent 30 minutes counseling the patient face to face. The total time spent in the appointment was 30 minutes.  Drue Second, MD Medical/Oncology [facility] [phone] (beeper) [phone] (Office)  09/12/2011, 12:44 PM

## 2011-09-12 NOTE — Patient Instructions (Addendum)
1. For constipation begin taking lactulose TID until you have a Bowel movement., then take it as needed.  2. Continue taking senna 2 pills daily.  3. For pain please pick up the tramadol 50 mg at bedtime  4. I will see you back on 09/26/11 for labs/MD visit and and chemotherapy

## 2011-09-12 NOTE — Progress Notes (Signed)
[name] came in today to drop off her [facility] [employer] forms. Forward forms to Axel Filler and advised [name] will fax forms to [employer]. She asked if [name] will mail a copy to her as well.

## 2011-09-19 ENCOUNTER — Encounter: Payer: Self-pay | Admitting: Oncology

## 2011-09-19 NOTE — Progress Notes (Signed)
Mailed patients cancer policy forms to her home.

## 2011-09-23 ENCOUNTER — Other Ambulatory Visit: Payer: No Typology Code available for payment source | Admitting: Lab

## 2011-09-23 ENCOUNTER — Ambulatory Visit: Payer: No Typology Code available for payment source | Admitting: Family

## 2011-09-26 ENCOUNTER — Other Ambulatory Visit (HOSPITAL_BASED_OUTPATIENT_CLINIC_OR_DEPARTMENT_OTHER): Payer: Medicare Other | Admitting: Lab

## 2011-09-26 ENCOUNTER — Ambulatory Visit (HOSPITAL_BASED_OUTPATIENT_CLINIC_OR_DEPARTMENT_OTHER): Payer: Medicare Other | Admitting: Oncology

## 2011-09-26 ENCOUNTER — Other Ambulatory Visit: Payer: Self-pay | Admitting: Medical Oncology

## 2011-09-26 ENCOUNTER — Telehealth: Payer: Self-pay | Admitting: *Deleted

## 2011-09-26 ENCOUNTER — Encounter: Payer: Self-pay | Admitting: Oncology

## 2011-09-26 ENCOUNTER — Ambulatory Visit (HOSPITAL_BASED_OUTPATIENT_CLINIC_OR_DEPARTMENT_OTHER): Payer: Medicare Other

## 2011-09-26 VITALS — BP 93/59 | HR 71 | Temp 98.0°F | Ht 59.0 in | Wt 119.4 lb

## 2011-09-26 VITALS — BP 109/62 | HR 66 | Temp 98.2°F

## 2011-09-26 DIAGNOSIS — C8298 Follicular lymphoma, unspecified, lymph nodes of multiple sites: Secondary | ICD-10-CM | POA: Diagnosis not present

## 2011-09-26 DIAGNOSIS — Z5112 Encounter for antineoplastic immunotherapy: Secondary | ICD-10-CM

## 2011-09-26 DIAGNOSIS — C8280 Other types of follicular lymphoma, unspecified site: Secondary | ICD-10-CM

## 2011-09-26 DIAGNOSIS — C8299 Follicular lymphoma, unspecified, extranodal and solid organ sites: Secondary | ICD-10-CM

## 2011-09-26 DIAGNOSIS — K5909 Other constipation: Secondary | ICD-10-CM | POA: Diagnosis not present

## 2011-09-26 DIAGNOSIS — H04129 Dry eye syndrome of unspecified lacrimal gland: Secondary | ICD-10-CM | POA: Diagnosis not present

## 2011-09-26 LAB — COMPREHENSIVE METABOLIC PANEL
ALT: 15 U/L (ref 0–35)
Albumin: 4 g/dL (ref 3.5–5.2)
CO2: 22 mEq/L (ref 19–32)
Calcium: 9.2 mg/dL (ref 8.4–10.5)
Chloride: 103 mEq/L (ref 96–112)
Creatinine, Ser: 1.02 mg/dL (ref 0.50–1.10)
Potassium: 4.6 mEq/L (ref 3.5–5.3)
Sodium: 139 mEq/L (ref 135–145)
Total Protein: 6 g/dL (ref 6.0–8.3)

## 2011-09-26 LAB — CBC WITH DIFFERENTIAL/PLATELET
Eosinophils Absolute: 0.1 10*3/uL (ref 0.0–0.5)
LYMPH%: 16.7 % (ref 14.0–49.7)
MONO#: 1.1 10*3/uL — ABNORMAL HIGH (ref 0.1–0.9)
NEUT#: 4.2 10*3/uL (ref 1.5–6.5)
Platelets: 412 10*3/uL — ABNORMAL HIGH (ref 145–400)
RBC: 4.07 10*6/uL (ref 3.70–5.45)
WBC: 6.7 10*3/uL (ref 3.9–10.3)
lymph#: 1.1 10*3/uL (ref 0.9–3.3)
nRBC: 0 % (ref 0–0)

## 2011-09-26 LAB — LACTATE DEHYDROGENASE: LDH: 188 U/L (ref 94–250)

## 2011-09-26 MED ORDER — DIPHENHYDRAMINE HCL 25 MG PO CAPS
50.0000 mg | ORAL_CAPSULE | Freq: Once | ORAL | Status: AC
  Administered 2011-09-26: 50 mg via ORAL

## 2011-09-26 MED ORDER — HEPARIN SOD (PORK) LOCK FLUSH 100 UNIT/ML IV SOLN
500.0000 [IU] | Freq: Once | INTRAVENOUS | Status: AC | PRN
  Administered 2011-09-26: 500 [IU]
  Filled 2011-09-26: qty 5

## 2011-09-26 MED ORDER — ACETAMINOPHEN 325 MG PO TABS
650.0000 mg | ORAL_TABLET | Freq: Once | ORAL | Status: AC
  Administered 2011-09-26: 650 mg via ORAL

## 2011-09-26 MED ORDER — PREDNISONE 20 MG PO TABS: 160.0000 mg | ORAL_TABLET | Freq: Every day | ORAL | Status: DC

## 2011-09-26 MED ORDER — SODIUM CHLORIDE 0.9 % IV SOLN
Freq: Once | INTRAVENOUS | Status: AC
  Administered 2011-09-26: 11:00:00 via INTRAVENOUS

## 2011-09-26 MED ORDER — SODIUM CHLORIDE 0.9 % IJ SOLN
10.0000 mL | INTRAMUSCULAR | Status: DC | PRN
  Administered 2011-09-26: 10 mL
  Filled 2011-09-26: qty 10

## 2011-09-26 MED ORDER — ONDANSETRON 16 MG/50ML IVPB (CHCC): 16.0000 mg | Freq: Once | INTRAVENOUS | Status: DC

## 2011-09-26 MED ORDER — SODIUM CHLORIDE 0.9 % IV SOLN
375.0000 mg/m2 | Freq: Once | INTRAVENOUS | Status: AC
  Administered 2011-09-26: 600 mg via INTRAVENOUS
  Filled 2011-09-26: qty 60

## 2011-09-26 MED ORDER — DEXAMETHASONE SODIUM PHOSPHATE 4 MG/ML IJ SOLN: 20.0000 mg | Freq: Once | INTRAMUSCULAR | Status: DC

## 2011-09-26 NOTE — Progress Notes (Signed)
OFFICE PROGRESS NOTE  CC Dr. Tommie Ard Dr. 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  PRIOR THERAPY:  #1 patient had a PET/CT performed that revealed Upper normal sized axillary and subpectoral nodes, corresponding to hypermetabolism at PET.CT of the abdomen and pelvis revealed Pelvic adenopathy, consistent with active disease. Equivocal abdominal retroperitoneal findings. Borderline adenopathy without hypermetabolism superiorly. A small more inferior retroperitoneal node demonstrates mild nonspecific hypermetabolism at PET. Scattered liver lesions, technically too small to characterize  but most likely benign cysts or hemangiomas.  #2 patient will begin systemic chemotherapy as she is symptomatic from her lymphoma. I have recommended CVP Rituxan combination given every 21 days for a total of 4-6 cycles. Started on 09/05/11  CURRENT THERAPY: cycle 2 of CVP-Rituxan (hold cytoxan and vincristine this week)  INTERVAL HISTORY: [name] 78 y.o. female returns for followup visit.She developed vincristine induced constipation. Otherwise did well her counts have recovered very nicely. She also tells me that the tramadol has helped with her back pain. She is not feeling as much pressure in her pelvic area as she had been prior to the chemotherapy initiation. She has not experienced any fevers chills night sweats. She did have constipation and abdominal pain eventually relieved by enemas. She has no bleeding problems. No peripheral paresthesias. Remainder of the 10 point review of systems is negative.  MEDICAL HISTORY: Past Medical History  Diagnosis Date  . Cancer 07/08/11    follicular B cell lymphoma  . TIA (transient ischemic attack)   . Arthritis   . Hypertension   . Esophageal abnormality     strictures  . Arthritis 09/04/2011    ALLERGIES:  is allergic to codeine; other; penicillins; and sulfa antibiotics.  MEDICATIONS:  Current  Outpatient Prescriptions  Medication Sig Dispense Refill  . allopurinol (ZYLOPRIM) 100 MG tablet Take 1 tablet (100 mg total) by mouth 2 (two) times daily.  60 tablet  3  . amLODipine-atorvastatin (CADUET) 5-40 MG per tablet Take 1 tablet by mouth daily.      . clopidogrel (PLAVIX) 75 MG tablet Take 75 mg by mouth daily.      . famotidine (PEPCID) 10 MG tablet Take 10 mg by mouth daily.      [name] lactulose (CHRONULAC) 10 GM/15ML solution Take 10 g by mouth 3 (three) times daily.      [name] lidocaine-prilocaine (EMLA) cream Apply topically as needed.  30 g  6  . loratadine (CLARITIN) 10 MG tablet Take 10 mg by mouth as needed.      . metoprolol (LOPRESSOR) 100 MG tablet Take 100 mg by mouth 2 (two) times daily.      . Multiple Vitamin (MULTIVITAMIN) tablet Take 1 tablet by mouth daily.      . ondansetron (ZOFRAN) 8 MG tablet Take 1 tablet two times a day starting the day after chemo for 3 days. Then take 1 tablet two times a day as needed for nausea or vomiting.  30 tablet  1  . predniSONE (DELTASONE) 20 MG tablet Take 20 mg by mouth daily.      . pregabalin (LYRICA) 25 MG capsule Take 25 mg by mouth at bedtime.      . prochlorperazine (COMPAZINE) 10 MG tablet Take 1 tablet (10 mg total) by mouth every 6 (six) hours as needed (Nausea or vomiting).  30 tablet  1  . prochlorperazine (COMPAZINE) 25 MG suppository Place 1 suppository (25 mg total) rectally every 12 (twelve) hours  as needed for nausea.  12 suppository  3  . sennosides-docusate sodium (SENOKOT-S) 8.6-50 MG tablet Take 1 tablet by mouth daily.      . traMADol (ULTRAM) 50 MG tablet Take 50 mg by mouth every 6 (six) hours as needed.      . valsartan (DIOVAN) 160 MG tablet Take 160 mg by mouth daily.      [name] acetaminophen (TYLENOL) 500 MG tablet Take 500 mg by mouth every 6 (six) hours as needed. For pain        SURGICAL HISTORY:  Past Surgical History  Procedure Date  . Appendectomy   . Parotidectomy w/ neck dissection total   .  Tonsillectomy     REVIEW OF SYSTEMS:  Pertinent items are noted in HPI.   PHYSICAL EXAMINATION: Lymph nodes: Cervical, supraclavicular, and axillary nodes normal. Cardio: regular rate and rhythm, S1, S2 normal, no murmur, click, rub or gallop GI: soft, non-tender; bowel sounds normal; no masses,  no organomegaly Extremities: extremities normal, atraumatic, no cyanosis or edema Neurologic: Grossly normal  ECOG PERFORMANCE STATUS: 1 - Symptomatic but completely ambulatory  Blood pressure 93/59, pulse 71, temperature 98 F (36.7 C), temperature source Oral, height 4\' 11"  (1.499 m), weight 119 lb 6.4 oz (54.159 kg).  LABORATORY DATA: Lab Results  Component Value Date   WBC 6.7 09/26/2011   HGB 12.4 09/26/2011   HCT 35.1 09/26/2011   MCV 86.2 09/26/2011   PLT 412* 09/26/2011      Chemistry      Component Value Date/Time   NA 138 09/12/2011 1111   K 3.6 09/12/2011 1111   CL 98 09/12/2011 1111   CO2 31 09/12/2011 1111   BUN 18 09/12/2011 1111   CREATININE 1.01 09/12/2011 1111      Component Value Date/Time   CALCIUM 9.7 09/12/2011 1111   ALKPHOS 85 09/12/2011 1111   AST 16 09/12/2011 1111   ALT 30 09/12/2011 1111   BILITOT 0.6 09/12/2011 1111       RADIOGRAPHIC STUDIES:    ASSESSMENT: 78 year old female with  #1 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 She is status post staging scans which does reveals disease in the chest axilla as well as pelvic region. Patient is also symptomatic with abdominal and pelvic pain.Patient is now status post cycle #1 of CVP Rituxan. 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 She required enemas to evacuate her stools. She also does have lactulose as well as Senokot and milk of magnesia and MiraLAX to help her with her bowels.  #2 because of the significant side effects the patient experienced I will hold the vincristine. Patient also tells me that she is planning on going to a wedding later on this  week and she is concerned about the side effects of the chemotherapy and after extensive discussion we also held the cyclophosphamide. She is only getting Rituxan and prednisone for this cycle of chemotherapy.  PLAN:  #1 we will only give patient Rituxan and prednisone combination for this cycle to of her chemotherapy.  #2 patient will return in one week's time for interim labs in followup.  All questions were answered. The patient knows to call the clinic with any problems, questions or concerns. We can certainly see the patient much sooner if necessary.  I spent 30 minutes counseling the patient face to face. The total time spent in the appointment was 30 minutes.    Drue Second, MD Medical/Oncology   [facility] [phone] (beeper) [phone] (Office)  09/26/2011, 9:51 AM

## 2011-09-26 NOTE — Telephone Encounter (Signed)
left voice message to inform the patient of the new date and time on 10-07-2011 starting at 1:00 with labs made patient appointment with dr.buccini on 11-17-2011 at 4:30pm gave medical records information to fax over office notes to [phone]

## 2011-09-26 NOTE — Telephone Encounter (Signed)
left voice message to inform the patient of the new date and time on 10-07-2011 starting at 1:00 with labs made patient appointment with dr.buccini on 11-17-2011 at 4:30pm gave medical records information to fax over office notes to [phone] 

## 2011-09-26 NOTE — Patient Instructions (Addendum)
1.  Proceed with rituxan and prednisone today. We will hold cytoxan/vincristine today   2. Prednisone refills sent to your pharmacy. Please start takin gthe prednisone 8 tablets daily for 5 days.  3. Refer to Dr. Matthias Hughs for problems with swallowing  4. I will see you back on 7/16 in follow up

## 2011-09-26 NOTE — Patient Instructions (Addendum)
[facility] Discharge Instructions for Patients Receiving Chemotherapy  Today you received the following chemotherapy agents Rituxan   To help prevent nausea and vomiting after your treatment, we encourage you to take your nausea medication as prescribed If you develop nausea and vomiting that is not controlled by your nausea medication, call the clinic. If it is after clinic hours your family physician or the after hours number for the clinic or go to the Emergency Department.   BELOW ARE SYMPTOMS THAT SHOULD BE REPORTED IMMEDIATELY:  *FEVER GREATER THAN 100.5 F  *CHILLS WITH OR WITHOUT FEVER  NAUSEA AND VOMITING THAT IS NOT CONTROLLED WITH YOUR NAUSEA MEDICATION  *UNUSUAL SHORTNESS OF BREATH  *UNUSUAL BRUISING OR BLEEDING  TENDERNESS IN MOUTH AND THROAT WITH OR WITHOUT PRESENCE OF ULCERS  *URINARY PROBLEMS  *BOWEL PROBLEMS  UNUSUAL RASH Items with * indicate a potential emergency and should be followed up as soon as possible.  One of the nurses will contact you 24 hours after your treatment. Please let the nurse know about any problems that you may have experienced. Feel free to call the clinic you have any questions or concerns. The clinic phone number is [phone].   I have been informed and understand all the instructions given to me. I know to contact the clinic, my physician, or go to the Emergency Department if any problems should occur. I do not have any questions at this time, but understand that I may call the clinic during office hours   should I have any questions or need assistance in obtaining follow up care.    __________________________________________  _____________  __________ Signature of Patient or Authorized Representative            Date                   Time    __________________________________________ Nurse's Signature    

## 2011-09-27 ENCOUNTER — Ambulatory Visit (HOSPITAL_BASED_OUTPATIENT_CLINIC_OR_DEPARTMENT_OTHER): Payer: No Typology Code available for payment source

## 2011-09-27 VITALS — BP 80/53 | HR 75 | Temp 97.1°F

## 2011-09-27 DIAGNOSIS — C8280 Other types of follicular lymphoma, unspecified site: Secondary | ICD-10-CM

## 2011-09-27 DIAGNOSIS — C8298 Follicular lymphoma, unspecified, lymph nodes of multiple sites: Secondary | ICD-10-CM

## 2011-09-27 MED ORDER — PEGFILGRASTIM INJECTION 6 MG/0.6ML
6.0000 mg | Freq: Once | SUBCUTANEOUS | Status: AC
  Administered 2011-09-27: 6 mg via SUBCUTANEOUS

## 2011-09-29 ENCOUNTER — Other Ambulatory Visit: Payer: Self-pay | Admitting: Certified Registered Nurse Anesthetist

## 2011-09-29 DIAGNOSIS — N3941 Urge incontinence: Secondary | ICD-10-CM | POA: Diagnosis not present

## 2011-09-29 DIAGNOSIS — R350 Frequency of micturition: Secondary | ICD-10-CM | POA: Diagnosis not present

## 2011-09-29 DIAGNOSIS — N8112 Cystocele, lateral: Secondary | ICD-10-CM | POA: Diagnosis not present

## 2011-09-29 DIAGNOSIS — N35919 Unspecified urethral stricture, male, unspecified site: Secondary | ICD-10-CM | POA: Diagnosis not present

## 2011-09-29 DIAGNOSIS — R319 Hematuria, unspecified: Secondary | ICD-10-CM | POA: Diagnosis not present

## 2011-10-16 ENCOUNTER — Other Ambulatory Visit: Payer: No Typology Code available for payment source | Admitting: Lab

## 2011-10-16 ENCOUNTER — Ambulatory Visit: Payer: No Typology Code available for payment source

## 2011-10-16 ENCOUNTER — Ambulatory Visit: Payer: No Typology Code available for payment source | Admitting: Family

## 2011-10-17 ENCOUNTER — Ambulatory Visit: Payer: No Typology Code available for payment source | Admitting: Family

## 2011-10-17 ENCOUNTER — Other Ambulatory Visit: Payer: No Typology Code available for payment source | Admitting: Lab

## 2011-10-17 ENCOUNTER — Ambulatory Visit: Payer: No Typology Code available for payment source

## 2011-10-24 ENCOUNTER — Other Ambulatory Visit (HOSPITAL_BASED_OUTPATIENT_CLINIC_OR_DEPARTMENT_OTHER): Payer: Medicare Other | Admitting: Lab

## 2011-10-24 ENCOUNTER — Telehealth: Payer: Self-pay | Admitting: Oncology

## 2011-10-24 ENCOUNTER — Ambulatory Visit (HOSPITAL_BASED_OUTPATIENT_CLINIC_OR_DEPARTMENT_OTHER): Payer: Medicare Other | Admitting: Oncology

## 2011-10-24 ENCOUNTER — Ambulatory Visit (HOSPITAL_BASED_OUTPATIENT_CLINIC_OR_DEPARTMENT_OTHER): Payer: Medicare Other

## 2011-10-24 ENCOUNTER — Telehealth: Payer: Self-pay | Admitting: *Deleted

## 2011-10-24 VITALS — BP 82/47 | HR 62 | Temp 97.4°F | Resp 20

## 2011-10-24 VITALS — BP 111/65 | HR 57 | Temp 98.0°F | Resp 20 | Ht 59.0 in | Wt 119.3 lb

## 2011-10-24 DIAGNOSIS — R599 Enlarged lymph nodes, unspecified: Secondary | ICD-10-CM | POA: Diagnosis not present

## 2011-10-24 DIAGNOSIS — C8299 Follicular lymphoma, unspecified, extranodal and solid organ sites: Secondary | ICD-10-CM

## 2011-10-24 DIAGNOSIS — K5900 Constipation, unspecified: Secondary | ICD-10-CM | POA: Diagnosis not present

## 2011-10-24 DIAGNOSIS — E860 Dehydration: Secondary | ICD-10-CM

## 2011-10-24 DIAGNOSIS — C8280 Other types of follicular lymphoma, unspecified site: Secondary | ICD-10-CM

## 2011-10-24 DIAGNOSIS — Z5112 Encounter for antineoplastic immunotherapy: Secondary | ICD-10-CM

## 2011-10-24 DIAGNOSIS — Z5111 Encounter for antineoplastic chemotherapy: Secondary | ICD-10-CM

## 2011-10-24 LAB — CBC WITH DIFFERENTIAL/PLATELET
Basophils Absolute: 0 10*3/uL (ref 0.0–0.1)
Eosinophils Absolute: 0.5 10*3/uL (ref 0.0–0.5)
HCT: 37 % (ref 34.8–46.6)
HGB: 12.5 g/dL (ref 11.6–15.9)
MONO#: 1 10*3/uL — ABNORMAL HIGH (ref 0.1–0.9)
NEUT#: 6.6 10*3/uL — ABNORMAL HIGH (ref 1.5–6.5)
NEUT%: 70.5 % (ref 38.4–76.8)
WBC: 9.3 10*3/uL (ref 3.9–10.3)
lymph#: 1.2 10*3/uL (ref 0.9–3.3)

## 2011-10-24 LAB — COMPREHENSIVE METABOLIC PANEL
AST: 21 U/L (ref 0–37)
Albumin: 4.2 g/dL (ref 3.5–5.2)
BUN: 11 mg/dL (ref 6–23)
Calcium: 9.2 mg/dL (ref 8.4–10.5)
Chloride: 105 mEq/L (ref 96–112)
Glucose, Bld: 106 mg/dL — ABNORMAL HIGH (ref 70–99)
Potassium: 4.2 mEq/L (ref 3.5–5.3)
Total Protein: 5.9 g/dL — ABNORMAL LOW (ref 6.0–8.3)

## 2011-10-24 MED ORDER — SODIUM CHLORIDE 0.9 % IV SOLN
Freq: Once | INTRAVENOUS | Status: AC
  Administered 2011-10-24: 10:00:00 via INTRAVENOUS

## 2011-10-24 MED ORDER — ONDANSETRON 16 MG/50ML IVPB (CHCC)
16.0000 mg | Freq: Once | INTRAVENOUS | Status: AC
  Administered 2011-10-24: 16 mg via INTRAVENOUS

## 2011-10-24 MED ORDER — VINCRISTINE SULFATE CHEMO INJECTION 1 MG/ML
1.0000 mg | Freq: Once | INTRAVENOUS | Status: AC
  Administered 2011-10-24: 1 mg via INTRAVENOUS
  Filled 2011-10-24: qty 1

## 2011-10-24 MED ORDER — DEXAMETHASONE SODIUM PHOSPHATE 4 MG/ML IJ SOLN
20.0000 mg | Freq: Once | INTRAMUSCULAR | Status: AC
  Administered 2011-10-24: 20 mg via INTRAVENOUS

## 2011-10-24 MED ORDER — SODIUM CHLORIDE 0.9 % IV SOLN
800.0000 mg/m2 | Freq: Once | INTRAVENOUS | Status: AC
  Administered 2011-10-24: 1240 mg via INTRAVENOUS
  Filled 2011-10-24: qty 62

## 2011-10-24 MED ORDER — HEPARIN SOD (PORK) LOCK FLUSH 100 UNIT/ML IV SOLN
500.0000 [IU] | Freq: Once | INTRAVENOUS | Status: AC | PRN
  Administered 2011-10-24: 500 [IU]
  Filled 2011-10-24: qty 5

## 2011-10-24 MED ORDER — SODIUM CHLORIDE 0.9 % IJ SOLN
10.0000 mL | INTRAMUSCULAR | Status: DC | PRN
  Administered 2011-10-24: 10 mL
  Filled 2011-10-24: qty 10

## 2011-10-24 MED ORDER — SODIUM CHLORIDE 0.9 % IV SOLN
375.0000 mg/m2 | Freq: Once | INTRAVENOUS | Status: AC
  Administered 2011-10-24: 600 mg via INTRAVENOUS
  Filled 2011-10-24: qty 60

## 2011-10-24 MED ORDER — DIPHENHYDRAMINE HCL 25 MG PO CAPS
50.0000 mg | ORAL_CAPSULE | Freq: Once | ORAL | Status: AC
  Administered 2011-10-24: 50 mg via ORAL

## 2011-10-24 MED ORDER — ACETAMINOPHEN 325 MG PO TABS
650.0000 mg | ORAL_TABLET | Freq: Once | ORAL | Status: AC
  Administered 2011-10-24: 650 mg via ORAL

## 2011-10-24 MED ORDER — SODIUM CHLORIDE 0.9 % IV SOLN
INTRAVENOUS | Status: DC
  Administered 2011-10-24: 500 mL via INTRAVENOUS

## 2011-10-24 NOTE — Patient Instructions (Signed)
Patient aware of next appointment; discharged home with no complaints. 

## 2011-10-24 NOTE — Progress Notes (Signed)
OFFICE PROGRESS NOTE  CC Dr. Tommie Ard Dr. 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  PRIOR THERAPY:  #1 patient had a PET/CT performed that revealed Upper normal sized axillary and subpectoral nodes, corresponding to hypermetabolism at PET.CT of the abdomen and pelvis revealed Pelvic adenopathy, consistent with active disease. Equivocal abdominal retroperitoneal findings. Borderline adenopathy without hypermetabolism superiorly. A small more inferior retroperitoneal node demonstrates mild nonspecific hypermetabolism at PET. Scattered liver lesions, technically too small to characterize  but most likely benign cysts or hemangiomas.  #2 patient will begin systemic chemotherapy as she is symptomatic from her lymphoma. I have recommended CVP Rituxan combination given every 21 days for a total of 4-6 cycles. Started on 09/05/11  CURRENT THERAPY: cycle 3 of CVP-Rituxan  INTERVAL HISTORY: [name] 78 y.o. female returns for followup visit.Overall patient is doing well she had a good trip to [place] when she got to see all of her family. All of her children were there as well. She does complain of being fatigued with her performance status as at about a 1 - 2.She denies having any bleeding problems she still does have some bursitis type of pain. She also states that she is having difficulty keeping since everything tastes like cardboard. Her bowels have been regular for now. However while she was traveling she did develop diarrhea and she backed down on taking the laxatives that she has been taking. We discussed use of laxatives and stool softeners again and she is getting vincristine. I have reduced the dose of the vincristine. She has no bleeding problems she has no nausea or vomiting. Remainder of the 10 point review of systems is negative. MEDICAL HISTORY: Past Medical History  Diagnosis Date  . Cancer 07/08/11    follicular B cell lymphoma   . TIA (transient ischemic attack)   . Arthritis   . Hypertension   . Esophageal abnormality     strictures  . Arthritis 09/04/2011    ALLERGIES:  is allergic to codeine; other; penicillins; and sulfa antibiotics.  MEDICATIONS:  Current Outpatient Prescriptions  Medication Sig Dispense Refill  . acetaminophen (TYLENOL) 500 MG tablet Take 500 mg by mouth every 6 (six) hours as needed. For pain      . allopurinol (ZYLOPRIM) 100 MG tablet Take 1 tablet (100 mg total) by mouth 2 (two) times daily.  60 tablet  3  . amLODipine-atorvastatin (CADUET) 5-40 MG per tablet Take 1 tablet by mouth daily.      . clopidogrel (PLAVIX) 75 MG tablet Take 75 mg by mouth daily.      . famotidine (PEPCID) 10 MG tablet Take 10 mg by mouth daily.      [name] lactulose (CHRONULAC) 10 GM/15ML solution Take 10 g by mouth 3 (three) times daily.      [name] lidocaine-prilocaine (EMLA) cream Apply topically as needed.  30 g  6  . loratadine (CLARITIN) 10 MG tablet Take 10 mg by mouth as needed.      . metoprolol (LOPRESSOR) 100 MG tablet Take 100 mg by mouth 2 (two) times daily.      . Multiple Vitamin (MULTIVITAMIN) tablet Take 1 tablet by mouth daily.      . ondansetron (ZOFRAN) 8 MG tablet Take 1 tablet two times a day starting the day after chemo for 3 days. Then take 1 tablet two times a day as needed for nausea or vomiting.  30 tablet  1  . predniSONE (DELTASONE)  20 MG tablet Take 8 tablets (160 mg total) by mouth daily. Take daily for 5 days  40 tablet  4  . pregabalin (LYRICA) 25 MG capsule Take 25 mg by mouth at bedtime.      . prochlorperazine (COMPAZINE) 10 MG tablet Take 1 tablet (10 mg total) by mouth every 6 (six) hours as needed (Nausea or vomiting).  30 tablet  1  . prochlorperazine (COMPAZINE) 25 MG suppository Place 1 suppository (25 mg total) rectally every 12 (twelve) hours as needed for nausea.  12 suppository  3  . sennosides-docusate sodium (SENOKOT-S) 8.6-50 MG tablet Take 1 tablet by mouth daily.        . traMADol (ULTRAM) 50 MG tablet Take 50 mg by mouth every 6 (six) hours as needed.      . valsartan (DIOVAN) 160 MG tablet Take 160 mg by mouth daily.       No current facility-administered medications for this visit.   Facility-Administered Medications Ordered in Other Visits  Medication Dose Route Frequency Provider Last Rate Last Dose  . 0.9 %  sodium chloride infusion   Intravenous Once Victorino December, MD      . 0.9 %  sodium chloride infusion   Intravenous Continuous Victorino December, MD   500 mL at 10/24/11 1310  . acetaminophen (TYLENOL) tablet 650 mg  650 mg Oral Once Victorino December, MD   650 mg at 10/24/11 1020  . cyclophosphamide (CYTOXAN) 1,240 mg in sodium chloride 0.9 % 250 mL chemo infusion  800 mg/m2 (Treatment Plan Actual) Intravenous Once Victorino December, MD   1,240 mg at 10/24/11 1056  . dexamethasone (DECADRON) injection 20 mg  20 mg Intravenous Once Victorino December, MD   20 mg at 10/24/11 1021  . diphenhydrAMINE (BENADRYL) capsule 50 mg  50 mg Oral Once Victorino December, MD   50 mg at 10/24/11 1020  . heparin lock flush 100 unit/mL  500 Units Intracatheter Once PRN Victorino December, MD   500 Units at 10/24/11 1444  . ondansetron (ZOFRAN) IVPB 16 mg  16 mg Intravenous Once Victorino December, MD   16 mg at 10/24/11 1021  . riTUXimab (RITUXAN) 600 mg in sodium chloride 0.9 % 190 mL chemo infusion  375 mg/m2 (Order-Specific) Intravenous Once Victorino December, MD   600 mg at 10/24/11 1205  . sodium chloride 0.9 % injection 10 mL  10 mL Intracatheter PRN Victorino December, MD   10 mL at 10/24/11 1444  . vinCRIStine (ONCOVIN) 1 mg in sodium chloride 0.9 % 19 mL chemo injection  1 mg Intravenous Once Victorino December, MD   1 mg at 10/24/11 1046    SURGICAL HISTORY:  Past Surgical History  Procedure Date  . Appendectomy   . Parotidectomy w/ neck dissection total   . Tonsillectomy     REVIEW OF SYSTEMS:  Pertinent items are noted in HPI.   PHYSICAL EXAMINATION: Lymph nodes: Cervical,  supraclavicular, and axillary nodes normal. Cardio: regular rate and rhythm, S1, S2 normal, no murmur, click, rub or gallop GI: soft, non-tender; bowel sounds normal; no masses,  no organomegaly Extremities: extremities normal, atraumatic, no cyanosis or edema Neurologic: Grossly normal  ECOG PERFORMANCE STATUS: 1 - Symptomatic but completely ambulatory  Blood pressure 111/65, pulse 57, temperature 98 F (36.7 C), temperature source Oral, resp. rate 20, height 4\' 11"  (1.499 m), weight 119 lb 4.8 oz (54.114 kg).  LABORATORY DATA: Lab Results  Component Value  Date   WBC 9.3 10/24/2011   HGB 12.5 10/24/2011   HCT 37.0 10/24/2011   MCV 91.6 10/24/2011   PLT 411* 10/24/2011      Chemistry      Component Value Date/Time   NA 139 10/24/2011 0816   K 4.2 10/24/2011 0816   CL 105 10/24/2011 0816   CO2 24 10/24/2011 0816   BUN 11 10/24/2011 0816   CREATININE 0.80 10/24/2011 0816      Component Value Date/Time   CALCIUM 9.2 10/24/2011 0816   ALKPHOS 90 10/24/2011 0816   AST 21 10/24/2011 0816   ALT 13 10/24/2011 0816   BILITOT 0.6 10/24/2011 0816       RADIOGRAPHIC STUDIES:    ASSESSMENT: 78 year old female with  #1 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 She is status post staging scans which does reveals disease in the chest axilla as well as pelvic region. Patient is also symptomatic with abdominal and pelvic pain.Patient is now status post cycle #1 of CVP Rituxan. 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 She required enemas to evacuate her stools. She also does have lactulose as well as Senokot and milk of magnesia and MiraLAX to help her with her bowels.  #2 Patient is here for cycle #3 of CVP R. I have reduced the dose of the vincristine  today. She will receive 1 mg.We discussed symptomatic management including patient using a good bowel regimen including doing Senokot and lactulose if needed. She also has MiraLAX prescriptions. For pain she  is using tramadol and that is helping. She will try to keep yourself well hydrated.  PLAN:  #1 Proceed with cycle #3 of CVP Rituxan.  #2 patient will return in one week's time for followup in interim labs.  All questions were answered. The patient knows to call the clinic with any problems, questions or concerns. We can certainly see the patient much sooner if necessary.  I spent 30 minutes counseling the patient face to face. The total time spent in the appointment was 30 minutes.    Drue Second, MD Medical/Oncology [facility] [phone] (beeper) [phone] (Office)  10/24/2011, 4:16 PM

## 2011-10-24 NOTE — Patient Instructions (Addendum)
Proceed with chemotherapy today. I have reduced the dose of vincristine to 1 mg only.   Neulasta on Saturday with IVF as well.  I will see you back in 1 week for follow up labs and visit

## 2011-10-24 NOTE — Telephone Encounter (Signed)
Added IVF for 8/3. Pt will have IVF today (8/2) w/tx and tomorrow 8/3 - per 8/2 pof. Dorene Grebe will gv pt appt time for tomorrow.

## 2011-10-24 NOTE — Progress Notes (Signed)
Patient discharged home with husband; post vital signs stable.

## 2011-10-24 NOTE — Telephone Encounter (Signed)
Made patient appointment for 11-11-2011 arrival time 11:15am pet scan and ct cap informed Efraim Kaufmann the md has requested the patient to have fluids on 10-24-2011 and 10-25-2011. Printed out calendar and gave to the patient

## 2011-10-25 ENCOUNTER — Ambulatory Visit: Payer: No Typology Code available for payment source

## 2011-10-25 ENCOUNTER — Ambulatory Visit (HOSPITAL_BASED_OUTPATIENT_CLINIC_OR_DEPARTMENT_OTHER): Payer: Medicare Other

## 2011-10-25 VITALS — BP 107/51 | HR 64 | Temp 98.0°F | Resp 20

## 2011-10-25 DIAGNOSIS — E860 Dehydration: Secondary | ICD-10-CM

## 2011-10-25 DIAGNOSIS — Z5189 Encounter for other specified aftercare: Secondary | ICD-10-CM

## 2011-10-25 DIAGNOSIS — C8299 Follicular lymphoma, unspecified, extranodal and solid organ sites: Secondary | ICD-10-CM

## 2011-10-25 DIAGNOSIS — C8280 Other types of follicular lymphoma, unspecified site: Secondary | ICD-10-CM

## 2011-10-25 MED ORDER — SODIUM CHLORIDE 0.9 % IJ SOLN
10.0000 mL | INTRAMUSCULAR | Status: DC | PRN
  Administered 2011-10-25: 10 mL via INTRAVENOUS
  Filled 2011-10-25: qty 10

## 2011-10-25 MED ORDER — PEGFILGRASTIM INJECTION 6 MG/0.6ML
6.0000 mg | Freq: Once | SUBCUTANEOUS | Status: AC
  Administered 2011-10-25: 6 mg via SUBCUTANEOUS

## 2011-10-25 MED ORDER — SODIUM CHLORIDE 0.9 % IV SOLN
INTRAVENOUS | Status: DC
  Administered 2011-10-25: 09:00:00 via INTRAVENOUS

## 2011-10-25 MED ORDER — HEPARIN SOD (PORK) LOCK FLUSH 100 UNIT/ML IV SOLN
500.0000 [IU] | Freq: Once | INTRAVENOUS | Status: AC
  Administered 2011-10-25: 500 [IU] via INTRAVENOUS
  Filled 2011-10-25: qty 5

## 2011-10-25 NOTE — Patient Instructions (Signed)
Dehydration, Adult Dehydration means your body does not have as much fluid as it needs. Your kidneys, brain, and heart will not work properly without the right amount of fluids and salt.  HOME CARE  Ask your doctor how to replace body fluid losses (rehydrate).   Drink enough fluids to keep your pee (urine) clear or pale yellow.   Drink small amounts of fluids often if you feel sick to your stomach (nauseous) or throw up (vomit).   Eat like you normally do.   Avoid:   Foods or drinks high in sugar.   Bubbly (carbonated) drinks.   Juice.   Very hot or cold fluids.   Drinks with caffeine.   Fatty, greasy foods.   Alcohol.   Tobacco.   Eating too much.   Gelatin desserts.   Wash your hands to avoid spreading germs (bacteria, viruses).   Only take medicine as told by your doctor.   Keep all doctor visits as told.  GET HELP RIGHT AWAY IF:   You cannot drink something without throwing up.   You get worse even with treatment.   Your vomit has blood in it or looks greenish.   Your poop (stool) has blood in it or looks black and tarry.   You have not peed in 6 to 8 hours.   You pee a small amount of very dark pee.   You have a fever.   You pass out (faint).   You have belly (abdominal) pain that gets worse or stays in one spot (localizes).   You have a rash, stiff neck, or bad headache.   You get easily annoyed, sleepy, or are hard to wake up.   You feel weak, dizzy, or very thirsty.  MAKE SURE YOU:   Understand these instructions.   Will watch your condition.   Will get help right away if you are not doing well or get worse.  Document Released: 01/04/2009 Document Revised: 02/27/2011 Document Reviewed: 10/28/2010 [employer] Patient Information 2012 [employer]. 

## 2011-10-31 ENCOUNTER — Encounter: Payer: Self-pay | Admitting: Oncology

## 2011-10-31 ENCOUNTER — Other Ambulatory Visit (HOSPITAL_BASED_OUTPATIENT_CLINIC_OR_DEPARTMENT_OTHER): Payer: Medicare Other | Admitting: Lab

## 2011-10-31 ENCOUNTER — Ambulatory Visit (HOSPITAL_BASED_OUTPATIENT_CLINIC_OR_DEPARTMENT_OTHER): Payer: Medicare Other | Admitting: Oncology

## 2011-10-31 VITALS — BP 108/63 | HR 62 | Temp 97.8°F | Resp 20 | Ht 59.0 in | Wt 119.9 lb

## 2011-10-31 DIAGNOSIS — C8299 Follicular lymphoma, unspecified, extranodal and solid organ sites: Secondary | ICD-10-CM

## 2011-10-31 DIAGNOSIS — C8280 Other types of follicular lymphoma, unspecified site: Secondary | ICD-10-CM

## 2011-10-31 LAB — CBC WITH DIFFERENTIAL/PLATELET
BASO%: 0.2 % (ref 0.0–2.0)
EOS%: 3 % (ref 0.0–7.0)
HCT: 38.4 % (ref 34.8–46.6)
MCH: 31.3 pg (ref 25.1–34.0)
MCHC: 33.5 g/dL (ref 31.5–36.0)
MONO#: 1.3 10*3/uL — ABNORMAL HIGH (ref 0.1–0.9)
RBC: 4.12 10*6/uL (ref 3.70–5.45)
RDW: 15 % — ABNORMAL HIGH (ref 11.2–14.5)
WBC: 3.7 10*3/uL — ABNORMAL LOW (ref 3.9–10.3)
lymph#: 0.8 10*3/uL — ABNORMAL LOW (ref 0.9–3.3)

## 2011-10-31 LAB — COMPREHENSIVE METABOLIC PANEL
ALT: 16 U/L (ref 0–35)
AST: 14 U/L (ref 0–37)
CO2: 31 mEq/L (ref 19–32)
Calcium: 9.2 mg/dL (ref 8.4–10.5)
Chloride: 100 mEq/L (ref 96–112)
Potassium: 3.4 mEq/L — ABNORMAL LOW (ref 3.5–5.3)
Sodium: 141 mEq/L (ref 135–145)
Total Protein: 6.2 g/dL (ref 6.0–8.3)

## 2011-10-31 LAB — LACTATE DEHYDROGENASE: LDH: 161 U/L (ref 94–250)

## 2011-10-31 NOTE — Progress Notes (Signed)
OFFICE PROGRESS NOTE  CC Dr. Tommie Ard Dr. 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  PRIOR THERAPY:  #1 patient had a PET/CT performed that revealed Upper normal sized axillary and subpectoral nodes, corresponding to hypermetabolism at PET.CT of the abdomen and pelvis revealed Pelvic adenopathy, consistent with active disease. Equivocal abdominal retroperitoneal findings. Borderline adenopathy without hypermetabolism superiorly. A small more inferior retroperitoneal node demonstrates mild nonspecific hypermetabolism at PET. Scattered liver lesions, technically too small to characterize  but most likely benign cysts or hemangiomas.  #2 patient will begin systemic chemotherapy as she is symptomatic from her lymphoma. I have recommended CVP Rituxan combination given every 21 days for a total of 4-6 cycles. Started on 09/05/11  CURRENT THERAPY: s/p  cycle 3 of CVP-Rituxan  INTERVAL HISTORY: [name] 78 y.o. female returns for followup visit.Patient does complain of some fatigue. But she has no nausea or vomiting. She is taking laxatives for constipation. Her pain is controlled with the tramadol that she has been taking. She has not had any side effects from the tramadol. Patient denies any peripheral paresthesias or foot drop. She does complain of some bursitis pain which is ongoing. She has not noticed any fevers chills or night sweats no nausea or vomiting. The remainder of the 10 point review of systems is negative.  MEDICAL HISTORY: Past Medical History  Diagnosis Date  . Cancer 07/08/11    follicular B cell lymphoma  . TIA (transient ischemic attack)   . Arthritis   . Hypertension   . Esophageal abnormality     strictures  . Arthritis 09/04/2011    ALLERGIES:  is allergic to codeine; other; penicillins; and sulfa antibiotics.  MEDICATIONS:  Current Outpatient Prescriptions  Medication Sig Dispense Refill  . acetaminophen  (TYLENOL) 500 MG tablet Take 500 mg by mouth every 6 (six) hours as needed. For pain      . allopurinol (ZYLOPRIM) 100 MG tablet Take 1 tablet (100 mg total) by mouth 2 (two) times daily.  60 tablet  3  . amLODipine-atorvastatin (CADUET) 5-40 MG per tablet Take 1 tablet by mouth daily.      . clopidogrel (PLAVIX) 75 MG tablet Take 75 mg by mouth daily.      . famotidine (PEPCID) 10 MG tablet Take 10 mg by mouth daily.      [name] lactulose (CHRONULAC) 10 GM/15ML solution Take 10 g by mouth 3 (three) times daily.      [name] lidocaine-prilocaine (EMLA) cream Apply topically as needed.  30 g  6  . loratadine (CLARITIN) 10 MG tablet Take 10 mg by mouth as needed.      . metoprolol (LOPRESSOR) 100 MG tablet Take 100 mg by mouth 2 (two) times daily.      . ondansetron (ZOFRAN) 8 MG tablet Take 1 tablet two times a day starting the day after chemo for 3 days. Then take 1 tablet two times a day as needed for nausea or vomiting.  30 tablet  1  . predniSONE (DELTASONE) 20 MG tablet Take 8 tablets (160 mg total) by mouth daily. Take daily for 5 days  40 tablet  4  . pregabalin (LYRICA) 25 MG capsule Take 25 mg by mouth at bedtime.      . prochlorperazine (COMPAZINE) 10 MG tablet Take 1 tablet (10 mg total) by mouth every 6 (six) hours as needed (Nausea or vomiting).  30 tablet  1  . prochlorperazine (COMPAZINE) 25 MG  suppository Place 1 suppository (25 mg total) rectally every 12 (twelve) hours as needed for nausea.  12 suppository  3  . sennosides-docusate sodium (SENOKOT-S) 8.6-50 MG tablet Take 1 tablet by mouth daily.      . traMADol (ULTRAM) 50 MG tablet Take 50 mg by mouth every 6 (six) hours as needed.      . valsartan (DIOVAN) 160 MG tablet Take 160 mg by mouth daily.      . Multiple Vitamin (MULTIVITAMIN) tablet Take 1 tablet by mouth daily.        SURGICAL HISTORY:  Past Surgical History  Procedure Date  . Appendectomy   . Parotidectomy w/ neck dissection total   . Tonsillectomy     REVIEW OF  SYSTEMS:  Pertinent items are noted in HPI.   PHYSICAL EXAMINATION: Lymph nodes: Cervical, supraclavicular, and axillary nodes normal. Cardio: regular rate and rhythm, S1, S2 normal, no murmur, click, rub or gallop GI: soft, non-tender; bowel sounds normal; no masses,  no organomegaly Extremities: extremities normal, atraumatic, no cyanosis or edema Neurologic: Grossly normal  ECOG PERFORMANCE STATUS: 1 - Symptomatic but completely ambulatory  Blood pressure 108/63, pulse 62, temperature 97.8 F (36.6 C), temperature source Oral, resp. rate 20, height 4\' 11"  (1.499 m), weight 119 lb 14.4 oz (54.386 kg).  LABORATORY DATA: Lab Results  Component Value Date   WBC 3.7* 10/31/2011   HGB 12.9 10/31/2011   HCT 38.4 10/31/2011   MCV 93.3 10/31/2011   PLT 193 10/31/2011      Chemistry      Component Value Date/Time   NA 139 10/24/2011 0816   K 4.2 10/24/2011 0816   CL 105 10/24/2011 0816   CO2 24 10/24/2011 0816   BUN 11 10/24/2011 0816   CREATININE 0.80 10/24/2011 0816      Component Value Date/Time   CALCIUM 9.2 10/24/2011 0816   ALKPHOS 90 10/24/2011 0816   AST 21 10/24/2011 0816   ALT 13 10/24/2011 0816   BILITOT 0.6 10/24/2011 0816       RADIOGRAPHIC STUDIES:    ASSESSMENT: 78 year old female with  #1 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 She is status post staging scans which does reveals disease in the chest axilla as well as pelvic region. Patient is also symptomatic with abdominal and pelvic pain.Patient is now status post cycle #1 of CVP Rituxan. 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 She required enemas to evacuate her stools. She also does have lactulose as well as Senokot and milk of magnesia and MiraLAX to help her with her bowels.  #2 Patient is s/p cycle #3 of CVP R. I have reduced the dose of the vincristine. She received 1 mg.We discussed symptomatic management including patient using a good bowel regimen including doing  Senokot and lactulose if needed. She also has MiraLAX prescriptions. For pain she is using tramadol and that is helping. She will try to keep yourself well hydrated.  PLAN:  #1 S/Pcycle #3 of CVP Rituxan She has tolerated it relatively well.  #2 patient will return in 2 week's time for followup For cycle #4 of CVP Rituxan.  #3 patient will continue her pain medications as prescribed also she will try to keep herself well hydrated continue to use laxatives for bowel regimen. She knows to call me with any problems questions or concerns.  All questions were answered. The patient knows to call the clinic with any problems, questions or concerns. We  can certainly see the patient much sooner if necessary.  I spent 30 minutes counseling the patient face to face. The total time spent in the appointment was 30 minutes.    Drue Second, MD Medical/Oncology [facility] [phone] (beeper) [phone] (Office)  10/31/2011, 10:29 AM

## 2011-10-31 NOTE — Patient Instructions (Addendum)
Doing well, please call if you develop fevers greater 100.8

## 2011-11-06 ENCOUNTER — Other Ambulatory Visit: Payer: No Typology Code available for payment source | Admitting: Lab

## 2011-11-06 ENCOUNTER — Ambulatory Visit: Payer: No Typology Code available for payment source | Admitting: Family

## 2011-11-07 ENCOUNTER — Ambulatory Visit: Payer: No Typology Code available for payment source

## 2011-11-11 ENCOUNTER — Ambulatory Visit (HOSPITAL_COMMUNITY)
Admission: RE | Admit: 2011-11-11 | Discharge: 2011-11-11 | Disposition: A | Discharge status: Home / Self Care | Payer: Medicare Other | Source: Ambulatory Visit | Attending: Oncology | Admitting: Oncology

## 2011-11-11 ENCOUNTER — Encounter (HOSPITAL_COMMUNITY)
Admission: RE | Admit: 2011-11-11 | Discharge: 2011-11-11 | Disposition: A | Discharge status: Home / Self Care | Payer: Medicare Other | Source: Ambulatory Visit | Attending: Oncology | Admitting: Oncology

## 2011-11-11 DIAGNOSIS — R599 Enlarged lymph nodes, unspecified: Secondary | ICD-10-CM | POA: Diagnosis not present

## 2011-11-11 DIAGNOSIS — C8589 Other specified types of non-Hodgkin lymphoma, extranodal and solid organ sites: Secondary | ICD-10-CM | POA: Diagnosis not present

## 2011-11-11 DIAGNOSIS — C8582 Other specified types of non-Hodgkin lymphoma, intrathoracic lymph nodes: Secondary | ICD-10-CM | POA: Diagnosis not present

## 2011-11-11 DIAGNOSIS — K7689 Other specified diseases of liver: Secondary | ICD-10-CM | POA: Diagnosis not present

## 2011-11-11 DIAGNOSIS — C8299 Follicular lymphoma, unspecified, extranodal and solid organ sites: Secondary | ICD-10-CM | POA: Diagnosis not present

## 2011-11-11 DIAGNOSIS — K5730 Diverticulosis of large intestine without perforation or abscess without bleeding: Secondary | ICD-10-CM | POA: Diagnosis not present

## 2011-11-11 DIAGNOSIS — C8280 Other types of follicular lymphoma, unspecified site: Secondary | ICD-10-CM

## 2011-11-11 DIAGNOSIS — Z90710 Acquired absence of both cervix and uterus: Secondary | ICD-10-CM | POA: Insufficient documentation

## 2011-11-11 DIAGNOSIS — M899 Disorder of bone, unspecified: Secondary | ICD-10-CM | POA: Insufficient documentation

## 2011-11-11 DIAGNOSIS — C8583 Other specified types of non-Hodgkin lymphoma, intra-abdominal lymph nodes: Secondary | ICD-10-CM | POA: Diagnosis not present

## 2011-11-11 LAB — GLUCOSE, CAPILLARY: Glucose-Capillary: 81 mg/dL (ref 70–99)

## 2011-11-11 MED ORDER — FLUDEOXYGLUCOSE F - 18 (FDG) INJECTION
15.9000 | Freq: Once | INTRAVENOUS | Status: AC | PRN
  Administered 2011-11-11: 15.9 via INTRAVENOUS

## 2011-11-11 MED ORDER — IOHEXOL 300 MG/ML  SOLN
100.0000 mL | Freq: Once | INTRAMUSCULAR | Status: AC | PRN
  Administered 2011-11-11: 100 mL via INTRAVENOUS

## 2011-11-13 DIAGNOSIS — H01009 Unspecified blepharitis unspecified eye, unspecified eyelid: Secondary | ICD-10-CM | POA: Diagnosis not present

## 2011-11-14 ENCOUNTER — Ambulatory Visit (HOSPITAL_BASED_OUTPATIENT_CLINIC_OR_DEPARTMENT_OTHER): Payer: Medicare Other | Admitting: Oncology

## 2011-11-14 ENCOUNTER — Telehealth: Payer: Self-pay | Admitting: *Deleted

## 2011-11-14 ENCOUNTER — Encounter: Payer: Self-pay | Admitting: Oncology

## 2011-11-14 ENCOUNTER — Ambulatory Visit (HOSPITAL_BASED_OUTPATIENT_CLINIC_OR_DEPARTMENT_OTHER): Payer: Medicare Other

## 2011-11-14 ENCOUNTER — Other Ambulatory Visit (HOSPITAL_BASED_OUTPATIENT_CLINIC_OR_DEPARTMENT_OTHER): Payer: Medicare Other

## 2011-11-14 VITALS — BP 98/54 | HR 64 | Temp 96.9°F | Resp 18

## 2011-11-14 VITALS — BP 102/65 | HR 61 | Temp 98.3°F | Resp 20 | Ht 59.0 in | Wt 114.0 lb

## 2011-11-14 DIAGNOSIS — C8280 Other types of follicular lymphoma, unspecified site: Secondary | ICD-10-CM

## 2011-11-14 DIAGNOSIS — C8299 Follicular lymphoma, unspecified, extranodal and solid organ sites: Secondary | ICD-10-CM

## 2011-11-14 DIAGNOSIS — Z5112 Encounter for antineoplastic immunotherapy: Secondary | ICD-10-CM

## 2011-11-14 DIAGNOSIS — K7689 Other specified diseases of liver: Secondary | ICD-10-CM | POA: Diagnosis not present

## 2011-11-14 DIAGNOSIS — C8589 Other specified types of non-Hodgkin lymphoma, extranodal and solid organ sites: Secondary | ICD-10-CM | POA: Diagnosis not present

## 2011-11-14 DIAGNOSIS — Z5111 Encounter for antineoplastic chemotherapy: Secondary | ICD-10-CM

## 2011-11-14 LAB — COMPREHENSIVE METABOLIC PANEL
AST: 18 U/L (ref 0–37)
Albumin: 3.7 g/dL (ref 3.5–5.2)
Alkaline Phosphatase: 83 U/L (ref 39–117)
Glucose, Bld: 86 mg/dL (ref 70–99)
Potassium: 4 mEq/L (ref 3.5–5.3)
Sodium: 140 mEq/L (ref 135–145)
Total Bilirubin: 0.6 mg/dL (ref 0.3–1.2)
Total Protein: 5.8 g/dL — ABNORMAL LOW (ref 6.0–8.3)

## 2011-11-14 LAB — CBC WITH DIFFERENTIAL/PLATELET
BASO%: 0.8 % (ref 0.0–2.0)
EOS%: 2 % (ref 0.0–7.0)
MCH: 30.8 pg (ref 25.1–34.0)
MCHC: 34.3 g/dL (ref 31.5–36.0)
MCV: 89.8 fL (ref 79.5–101.0)
MONO%: 13.7 % (ref 0.0–14.0)
RBC: 4.03 10*6/uL (ref 3.70–5.45)
RDW: 15.8 % — ABNORMAL HIGH (ref 11.2–14.5)
lymph#: 0.6 10*3/uL — ABNORMAL LOW (ref 0.9–3.3)

## 2011-11-14 MED ORDER — DEXAMETHASONE SODIUM PHOSPHATE 4 MG/ML IJ SOLN
20.0000 mg | Freq: Once | INTRAMUSCULAR | Status: AC
  Administered 2011-11-14: 20 mg via INTRAVENOUS

## 2011-11-14 MED ORDER — SODIUM CHLORIDE 0.9 % IV SOLN
Freq: Once | INTRAVENOUS | Status: AC
  Administered 2011-11-14: 10:00:00 via INTRAVENOUS

## 2011-11-14 MED ORDER — DIPHENHYDRAMINE HCL 25 MG PO CAPS
50.0000 mg | ORAL_CAPSULE | Freq: Once | ORAL | Status: AC
  Administered 2011-11-14: 50 mg via ORAL

## 2011-11-14 MED ORDER — VINCRISTINE SULFATE CHEMO INJECTION 1 MG/ML
1.0000 mg | Freq: Once | INTRAVENOUS | Status: AC
  Administered 2011-11-14: 1 mg via INTRAVENOUS
  Filled 2011-11-14: qty 1

## 2011-11-14 MED ORDER — SODIUM CHLORIDE 0.9 % IV SOLN
800.0000 mg/m2 | Freq: Once | INTRAVENOUS | Status: AC
  Administered 2011-11-14: 1240 mg via INTRAVENOUS
  Filled 2011-11-14: qty 62

## 2011-11-14 MED ORDER — SODIUM CHLORIDE 0.9 % IV SOLN
375.0000 mg/m2 | Freq: Once | INTRAVENOUS | Status: AC
  Administered 2011-11-14: 600 mg via INTRAVENOUS
  Filled 2011-11-14: qty 60

## 2011-11-14 MED ORDER — HEPARIN SOD (PORK) LOCK FLUSH 100 UNIT/ML IV SOLN
500.0000 [IU] | Freq: Once | INTRAVENOUS | Status: AC | PRN
  Administered 2011-11-14: 500 [IU]
  Filled 2011-11-14: qty 5

## 2011-11-14 MED ORDER — SODIUM CHLORIDE 0.9 % IJ SOLN
10.0000 mL | INTRAMUSCULAR | Status: DC | PRN
  Administered 2011-11-14: 10 mL
  Filled 2011-11-14: qty 10

## 2011-11-14 MED ORDER — ACETAMINOPHEN 325 MG PO TABS
650.0000 mg | ORAL_TABLET | Freq: Once | ORAL | Status: AC
  Administered 2011-11-14: 650 mg via ORAL

## 2011-11-14 MED ORDER — ONDANSETRON 16 MG/50ML IVPB (CHCC)
16.0000 mg | Freq: Once | INTRAVENOUS | Status: AC
  Administered 2011-11-14: 16 mg via INTRAVENOUS

## 2011-11-14 NOTE — Telephone Encounter (Signed)
Gave patient appointment for 11-20-2011 12-05-2011 12-12-2011 12-26-2011 01-02-2012 injections on 12-06-2011 and 12-27-2011 sent michelle email to set up patient treatment

## 2011-11-14 NOTE — Patient Instructions (Signed)
[facility] Discharge Instructions for Patients Receiving Chemotherapy  Today you received the following chemotherapy agents Cytoxan/Vincristine/Rituxan.  To help prevent nausea and vomiting after your treatment, we encourage you to take your nausea medication as prescribed by Dr Welton Flakes. If you develop nausea and vomiting that is not controlled by your nausea medication, call the clinic. If it is after clinic hours your family physician or the after hours number for the clinic or go to the Emergency Department.   BELOW ARE SYMPTOMS THAT SHOULD BE REPORTED IMMEDIATELY:  *FEVER GREATER THAN 100.5 F  *CHILLS WITH OR WITHOUT FEVER  NAUSEA AND VOMITING THAT IS NOT CONTROLLED WITH YOUR NAUSEA MEDICATION  *UNUSUAL SHORTNESS OF BREATH  *UNUSUAL BRUISING OR BLEEDING  TENDERNESS IN MOUTH AND THROAT WITH OR WITHOUT PRESENCE OF ULCERS  *URINARY PROBLEMS  *BOWEL PROBLEMS  UNUSUAL RASH Items with * indicate a potential emergency and should be followed up as soon as possible.  Feel free to call the clinic you have any questions or concerns. The clinic phone number is [phone].

## 2011-11-14 NOTE — Progress Notes (Signed)
Error in charting Rituxan---Duplication in stop time, correct stop time is 1350, not 1400.

## 2011-11-14 NOTE — Progress Notes (Signed)
OFFICE PROGRESS NOTE  CC Dr. Tommie Ard Dr. 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  PRIOR THERAPY:  #1 patient had a PET/CT performed that revealed Upper normal sized axillary and subpectoral nodes, corresponding to hypermetabolism at PET.CT of the abdomen and pelvis revealed Pelvic adenopathy, consistent with active disease. Equivocal abdominal retroperitoneal findings. Borderline adenopathy without hypermetabolism superiorly. A small more inferior retroperitoneal node demonstrates mild nonspecific hypermetabolism at PET. Scattered liver lesions, technically too small to characterize  but most likely benign cysts or hemangiomas.  #2 patient will begin systemic chemotherapy as she is symptomatic from her lymphoma. I have recommended CVP Rituxan combination given every 21 days for a total of 4-6 cycles. Started on 09/05/11  CURRENT THERAPY: Here for cycle 4 of CVP Rituxan today. INTERVAL HISTORY: [name] 78 y.o. female returns for followup visit Overall she is doing well. She denies any fevers chills night sweats she does have some lower back pain as well as hip pain. She has no nausea or vomiting she is weak. She is still having some difficulty with swallowing. She is has not been seen by Dr. Matthias Hughs yet. I do think that I am going to have to make a followup appointment for her to be seen by him. Otherwise she seems to be getting along well. MEDICAL HISTORY: Past Medical History  Diagnosis Date  . Cancer 07/08/11    follicular B cell lymphoma  . TIA (transient ischemic attack)   . Arthritis   . Hypertension   . Esophageal abnormality     strictures  . Arthritis 09/04/2011    ALLERGIES:  is allergic to codeine; other; penicillins; and sulfa antibiotics.  MEDICATIONS:  Current Outpatient Prescriptions  Medication Sig Dispense Refill  . acetaminophen (TYLENOL) 500 MG tablet Take 500 mg by mouth every 6 (six) hours as needed.  For pain      . allopurinol (ZYLOPRIM) 100 MG tablet Take 1 tablet (100 mg total) by mouth 2 (two) times daily.  60 tablet  3  . amLODipine-atorvastatin (CADUET) 5-40 MG per tablet Take 1 tablet by mouth daily.      . clopidogrel (PLAVIX) 75 MG tablet Take 75 mg by mouth daily.      . famotidine (PEPCID) 10 MG tablet Take 10 mg by mouth daily.      [name] lactulose (CHRONULAC) 10 GM/15ML solution Take 10 g by mouth 3 (three) times daily.      [name] lidocaine-prilocaine (EMLA) cream Apply topically as needed.  30 g  6  . loratadine (CLARITIN) 10 MG tablet Take 10 mg by mouth as needed.      . metoprolol (LOPRESSOR) 100 MG tablet Take 100 mg by mouth 2 (two) times daily.      . Multiple Vitamin (MULTIVITAMIN) tablet Take 1 tablet by mouth daily.      . ondansetron (ZOFRAN) 8 MG tablet Take 1 tablet two times a day starting the day after chemo for 3 days. Then take 1 tablet two times a day as needed for nausea or vomiting.  30 tablet  1  . predniSONE (DELTASONE) 20 MG tablet Take 8 tablets (160 mg total) by mouth daily. Take daily for 5 days  40 tablet  4  . pregabalin (LYRICA) 25 MG capsule Take 25 mg by mouth at bedtime.      . prochlorperazine (COMPAZINE) 10 MG tablet Take 1 tablet (10 mg total) by mouth every 6 (six) hours as needed (Nausea  or vomiting).  30 tablet  1  . prochlorperazine (COMPAZINE) 25 MG suppository Place 1 suppository (25 mg total) rectally every 12 (twelve) hours as needed for nausea.  12 suppository  3  . sennosides-docusate sodium (SENOKOT-S) 8.6-50 MG tablet Take 1 tablet by mouth daily.      . traMADol (ULTRAM) 50 MG tablet Take 50 mg by mouth every 6 (six) hours as needed.      . valsartan (DIOVAN) 160 MG tablet Take 160 mg by mouth daily.        SURGICAL HISTORY:  Past Surgical History  Procedure Date  . Appendectomy   . Parotidectomy w/ neck dissection total   . Tonsillectomy     REVIEW OF SYSTEMS:  Pertinent items are noted in HPI.   PHYSICAL EXAMINATION: Lymph  nodes: Cervical, supraclavicular, and axillary nodes normal. Cardio: regular rate and rhythm, S1, S2 normal, no murmur, click, rub or gallop GI: soft, non-tender; bowel sounds normal; no masses,  no organomegaly Extremities: extremities normal, atraumatic, no cyanosis or edema Neurologic: Grossly normal  ECOG PERFORMANCE STATUS: 1 - Symptomatic but completely ambulatory  Blood pressure 102/65, pulse 61, temperature 98.3 F (36.8 C), temperature source Oral, resp. rate 20, height 4\' 11"  (1.499 m), weight 114 lb (51.71 kg).  LABORATORY DATA: Lab Results  Component Value Date   WBC 11.1* 11/14/2011   HGB 12.4 11/14/2011   HCT 36.2 11/14/2011   MCV 89.8 11/14/2011   PLT 382 11/14/2011      Chemistry      Component Value Date/Time   NA 141 10/31/2011 0912   K 3.4* 10/31/2011 0912   CL 100 10/31/2011 0912   CO2 31 10/31/2011 0912   BUN 12 10/31/2011 0912   CREATININE 0.85 10/31/2011 0912      Component Value Date/Time   CALCIUM 9.2 10/31/2011 0912   ALKPHOS 107 10/31/2011 0912   AST 14 10/31/2011 0912   ALT 16 10/31/2011 0912   BILITOT 0.7 10/31/2011 0912       RADIOGRAPHIC STUDIES:   NUCLEAR MEDICINE PET SKULL BASE TO THIGH  Fasting Blood Glucose: 81  Technique: 15.9 mCi F-18 FDG was injected intravenously. CT data  was obtained and used for attenuation correction and anatomic  localization only. (This was not acquired as a diagnostic CT  examination.) Additional exam technical data entered on  technologist worksheet.  Comparison: 07/29/2011  Findings:  Neck: No hypermetabolic lymph nodes in the neck.  Chest: No hypermetabolic mediastinal or hilar nodes. No  suspicious pulmonary nodules on the CT scan.  Abdomen/Pelvis: No abnormal hypermetabolic activity within the  liver, pancreas, adrenal glands, or spleen. No hypermetabolic  lymph nodes in the abdomen.  A single residual hypermetabolic lymph node is seen in the right  inguinal region which is significantly decreased in size, now    measuring 11 mm in short axis on image 209 compared to 2.3 cm on  previous study. This now has a maximum SUV of 3.2 compared to 16.6  on previous study. No other hypermetabolic lymph nodes seen within  the pelvis.  [place]: No focal hypermetabolic activity to suggest skeletal  metastasis. Diffuse increased hypermetabolic activity throughout  the skeleton is consistent with treatment response.  IMPRESSION:  Near complete metabolic response to therapy, with only a single  small right inguinal lymph node with persistent low grade  hypermetabolic activity.   CT CHEST, ABDOMEN AND PELVIS WITH CONTRAST  Technique: Multidetector CT imaging of the chest, abdomen and  pelvis was performed following  the standard protocol during bolus  administration of intravenous contrast.  Contrast: OMNIPAQUE IOHEXOL 300 MG/ML SOLN  Comparison: 07/29/2011  CT CHEST  Findings: Previously seen shotty bilateral axillary and  subpectoral lymphadenopathy has resolved since prior exam. No  evidence of mediastinal or hilar lymphadenopathy. No evidence of  chest wall mass.  No evidence of pleural or pericardial effusion. No suspicious  pulmonary nodules or masses are identified. No evidence of  pulmonary infiltrate or central endobronchial obstruction. No  suspicious bone lesions identified.  IMPRESSION:  Resolution of shotty axillary and subpectoral lymphadenopathy since  previous exam. No residual disease or other acute findings  identified within the thorax.  CT ABDOMEN AND PELVIS  Findings: Tiny sub-centimeter hepatic cysts remain stable. No  liver masses are identified. Gallbladder is unremarkable. The  pancreas, spleen, adrenal glands, and kidneys are normal  appearance. Prior hysterectomy noted. Adnexa are unremarkable.  No evidence of abdominal or retroperitoneal lymphadenopathy.  Decreased adenopathy is seen in the right inguinal region, now  measuring 1.1 x 1.7 cm compared with 2.4 x 3.4 cm on  previous  study. Left inguinal lymphadenopathy is resolved. No other areas  of lymphadenopathy identified within the pelvis.  Diverticulosis is again seen involving the sigmoid colon. No  evidence of diverticulitis or other inflammatory process. No  evidence of abscess or other abnormal fluid collections. No  suspicious bone lesions identified.  IMPRESSION:  1. Mild residual right inguinal lymphadenopathy, significantly  decreased since previous study.  2. No new or progressive disease within the pelvis.  3. Sigmoid diverticulosis. No radiographic evidence of  diverticulitis.    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 She is status post staging scans which does reveals disease in the chest axilla as well as pelvic region. Patient is also symptomatic with abdominal and pelvic pain.Patient is now status post cycle #1 of CVP Rituxan. 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 She required enemas to evacuate her stools. She also does have lactulose as well as Senokot and milk of magnesia and MiraLAX to help her with her bowels.  #2 Patient to proceed with cycle # 4 of CVP R. I have reduced the dose of the vincristine. She received 1 mg.We discussed symptomatic management including patient using a good bowel regimen including doing Senokot and lactulose if needed. She also has MiraLAX prescriptions. For pain she is using tramadol and that is helping. She will try to keep yourself well hydrated.  PLAN:  #1 She will proceed with cycle 4 of her chemotherapy today.  #2 I will plan on seeing her back in one week's time for interim labs.  All questions were answered. The patient knows to call the clinic with any problems, questions or concerns. We can certainly see the patient much sooner if necessary.  I spent 30 minutes counseling the patient face to face. The total time spent in the appointment was  30 minutes.    Drue Second, MD Medical/Oncology [facility] [phone] (beeper) [phone] (Office)  11/14/2011, 9:08 AM

## 2011-11-14 NOTE — Patient Instructions (Addendum)
Proceed with chemotherapy today  Scan results as below for your information   *RADIOLOGY REPORT*  Clinical Data: Follow-up follicular B-cell lymphoma. Ongoing  chemotherapy. Restaging.  CT CHEST, ABDOMEN AND PELVIS WITH CONTRAST  Technique: Multidetector CT imaging of the chest, abdomen and  pelvis was performed following the standard protocol during bolus  administration of intravenous contrast.  Contrast: OMNIPAQUE IOHEXOL 300 MG/ML SOLN  Comparison: 07/29/2011  CT CHEST  Findings: Previously seen shotty bilateral axillary and  subpectoral lymphadenopathy has resolved since prior exam. No  evidence of mediastinal or hilar lymphadenopathy. No evidence of  chest wall mass.  No evidence of pleural or pericardial effusion. No suspicious  pulmonary nodules or masses are identified. No evidence of  pulmonary infiltrate or central endobronchial obstruction. No  suspicious bone lesions identified.  IMPRESSION:  Resolution of shotty axillary and subpectoral lymphadenopathy since  previous exam. No residual disease or other acute findings  identified within the thorax.  CT ABDOMEN AND PELVIS  Findings: Tiny sub-centimeter hepatic cysts remain stable. No  liver masses are identified. Gallbladder is unremarkable. The  pancreas, spleen, adrenal glands, and kidneys are normal  appearance. Prior hysterectomy noted. Adnexa are unremarkable.  No evidence of abdominal or retroperitoneal lymphadenopathy.  Decreased adenopathy is seen in the right inguinal region, now  measuring 1.1 x 1.7 cm compared with 2.4 x 3.4 cm on previous  study. Left inguinal lymphadenopathy is resolved. No other areas  of lymphadenopathy identified within the pelvis.  Diverticulosis is again seen involving the sigmoid colon. No  evidence of diverticulitis or other inflammatory process. No  evidence of abscess or other abnormal fluid collections. No  suspicious bone lesions identified.  IMPRESSION:  1. Mild  residual right inguinal lymphadenopathy, significantly  decreased since previous study.  2. No new or progressive disease within the pelvis.  3. Sigmoid diverticulosis. No radiographic evidence of  diverticulitis.   NUCLEAR MEDICINE PET SKULL BASE TO THIGH  Fasting Blood Glucose: 81  Technique: 15.9 mCi F-18 FDG was injected intravenously. CT data  was obtained and used for attenuation correction and anatomic  localization only. (This was not acquired as a diagnostic CT  examination.) Additional exam technical data entered on  technologist worksheet.  Comparison: 07/29/2011  Findings:  Neck: No hypermetabolic lymph nodes in the neck.  Chest: No hypermetabolic mediastinal or hilar nodes. No  suspicious pulmonary nodules on the CT scan.  Abdomen/Pelvis: No abnormal hypermetabolic activity within the  liver, pancreas, adrenal glands, or spleen. No hypermetabolic  lymph nodes in the abdomen.  A single residual hypermetabolic lymph node is seen in the right  inguinal region which is significantly decreased in size, now  measuring 11 mm in short axis on image 209 compared to 2.3 cm on  previous study. This now has a maximum SUV of 3.2 compared to 16.6  on previous study. No other hypermetabolic lymph nodes seen within  the pelvis.  [place]: No focal hypermetabolic activity to suggest skeletal  metastasis. Diffuse increased hypermetabolic activity throughout  the skeleton is consistent with treatment response.  IMPRESSION:  Near complete metabolic response to therapy, with only a single  small right inguinal lymph node with persistent low grade  hypermetabolic activity.  Original Report Authenticated By: Danae Orleans, M.D.

## 2011-11-14 NOTE — Telephone Encounter (Signed)
Per staff message and POf I have scheduled appts.  JMW  

## 2011-11-15 ENCOUNTER — Ambulatory Visit (HOSPITAL_BASED_OUTPATIENT_CLINIC_OR_DEPARTMENT_OTHER): Payer: Medicare Other

## 2011-11-15 VITALS — BP 96/50 | HR 65 | Temp 97.7°F | Resp 18

## 2011-11-15 DIAGNOSIS — C8298 Follicular lymphoma, unspecified, lymph nodes of multiple sites: Secondary | ICD-10-CM | POA: Diagnosis not present

## 2011-11-15 DIAGNOSIS — Z5189 Encounter for other specified aftercare: Secondary | ICD-10-CM

## 2011-11-15 DIAGNOSIS — C8280 Other types of follicular lymphoma, unspecified site: Secondary | ICD-10-CM

## 2011-11-15 MED ORDER — PEGFILGRASTIM INJECTION 6 MG/0.6ML
6.0000 mg | Freq: Once | SUBCUTANEOUS | Status: AC
  Administered 2011-11-15: 6 mg via SUBCUTANEOUS

## 2011-11-19 ENCOUNTER — Telehealth: Payer: Self-pay | Admitting: Medical Oncology

## 2011-11-19 ENCOUNTER — Other Ambulatory Visit: Payer: Self-pay | Admitting: Medical Oncology

## 2011-11-19 NOTE — Telephone Encounter (Signed)
Patient LMOVM " I have been having increased pain for about 48 hours now and some diarrhea."  Spoke to patient regarding message.  "I am having this wavy like pain from the waist down and some diarrhea.  I have had some bleeding when I go to the bathroom but its just the hemmorhoids.  I haven't been able to sleep in 2 nights and I just don't know what to do.  I have been taking my medicine like I am suppose to."   Patient states that she took her Lactulose this morning but hasn't taken her Senna, advised patient to hold her Lactulose and senna today to help with the diarrhea, also diarrhea is a side effect of the chemo.  Advised patient to take her Tramadol as it had been a while since she took a dose.  Patient expressed understanding and confirmed instructions.  Instructed patient to call if no relief from pain or if bleeding continues/gets worse.

## 2011-11-20 ENCOUNTER — Ambulatory Visit (HOSPITAL_BASED_OUTPATIENT_CLINIC_OR_DEPARTMENT_OTHER): Payer: Medicare Other | Admitting: Oncology

## 2011-11-20 ENCOUNTER — Other Ambulatory Visit (HOSPITAL_BASED_OUTPATIENT_CLINIC_OR_DEPARTMENT_OTHER): Payer: Medicare Other | Admitting: Lab

## 2011-11-20 ENCOUNTER — Other Ambulatory Visit: Payer: Self-pay | Admitting: Medical Oncology

## 2011-11-20 ENCOUNTER — Encounter: Payer: Self-pay | Admitting: Oncology

## 2011-11-20 ENCOUNTER — Ambulatory Visit (HOSPITAL_BASED_OUTPATIENT_CLINIC_OR_DEPARTMENT_OTHER): Payer: Medicare Other

## 2011-11-20 VITALS — BP 105/57 | HR 76 | Temp 97.7°F | Resp 20 | Ht 59.0 in | Wt 110.8 lb

## 2011-11-20 DIAGNOSIS — E860 Dehydration: Secondary | ICD-10-CM

## 2011-11-20 DIAGNOSIS — C8299 Follicular lymphoma, unspecified, extranodal and solid organ sites: Secondary | ICD-10-CM

## 2011-11-20 DIAGNOSIS — R197 Diarrhea, unspecified: Secondary | ICD-10-CM | POA: Diagnosis not present

## 2011-11-20 DIAGNOSIS — C8280 Other types of follicular lymphoma, unspecified site: Secondary | ICD-10-CM

## 2011-11-20 HISTORY — DX: Dehydration: E86.0

## 2011-11-20 HISTORY — DX: Diarrhea, unspecified: R19.7

## 2011-11-20 LAB — CBC WITH DIFFERENTIAL/PLATELET
BASO%: 3.2 % — ABNORMAL HIGH (ref 0.0–2.0)
Basophils Absolute: 0.1 10e3/uL (ref 0.0–0.1)
EOS%: 0 % (ref 0.0–7.0)
Eosinophils Absolute: 0 10e3/uL (ref 0.0–0.5)
HCT: 33.6 % — ABNORMAL LOW (ref 34.8–46.6)
HGB: 11.6 g/dL (ref 11.6–15.9)
LYMPH%: 3.7 % — ABNORMAL LOW (ref 14.0–49.7)
MCH: 30.1 pg (ref 25.1–34.0)
MCHC: 34.5 g/dL (ref 31.5–36.0)
MCV: 87.3 fL (ref 79.5–101.0)
MONO#: 0.3 10e3/uL (ref 0.1–0.9)
MONO%: 15.3 % — ABNORMAL HIGH (ref 0.0–14.0)
NEUT#: 1.5 10e3/uL (ref 1.5–6.5)
NEUT%: 77.8 % — ABNORMAL HIGH (ref 38.4–76.8)
Platelets: 180 10e3/uL (ref 145–400)
RBC: 3.85 10e6/uL (ref 3.70–5.45)
RDW: 15.6 % — ABNORMAL HIGH (ref 11.2–14.5)
WBC: 1.9 10e3/uL — ABNORMAL LOW (ref 3.9–10.3)
lymph#: 0.1 10e3/uL — ABNORMAL LOW (ref 0.9–3.3)

## 2011-11-20 LAB — COMPREHENSIVE METABOLIC PANEL (CC13)
AST: 13 U/L (ref 5–34)
Alkaline Phosphatase: 97 U/L (ref 40–150)
BUN: 11 mg/dL (ref 7.0–26.0)
Creatinine: 0.8 mg/dL (ref 0.6–1.1)
Glucose: 167 mg/dl — ABNORMAL HIGH (ref 70–99)
Total Bilirubin: 0.9 mg/dL (ref 0.20–1.20)

## 2011-11-20 LAB — CORRECTED CALCIUM (CC13): Calcium, Corrected: 9.8 mg/dL (ref 8.4–10.4)

## 2011-11-20 MED ORDER — SODIUM CHLORIDE 0.9 % IV SOLN
INTRAVENOUS | Status: DC
  Administered 2011-11-20: 13:00:00 via INTRAVENOUS

## 2011-11-20 MED ORDER — SODIUM CHLORIDE 0.9 % IV SOLN
INTRAVENOUS | Status: DC
  Administered 2011-11-20: 13:00:00 via INTRAVENOUS
  Filled 2011-11-20: qty 1000

## 2011-11-20 NOTE — Patient Instructions (Addendum)
[facility] Discharge Instructions for Patients Receiving IV Fluids Today you received the following chemotherapy agents Normal Saline To help prevent nausea and vomiting after your treatment, we encourage you to take your nausea medication as directed.   If you develop nausea and vomiting that is not controlled by your nausea medication, call the clinic. If it is after clinic hours your family physician or the after hours number for the clinic or go to the Emergency Department.   BELOW ARE SYMPTOMS THAT SHOULD BE REPORTED IMMEDIATELY:  *FEVER GREATER THAN 100.5 F  *CHILLS WITH OR WITHOUT FEVER  NAUSEA AND VOMITING THAT IS NOT CONTROLLED WITH YOUR NAUSEA MEDICATION  *UNUSUAL SHORTNESS OF BREATH  *UNUSUAL BRUISING OR BLEEDING  TENDERNESS IN MOUTH AND THROAT WITH OR WITHOUT PRESENCE OF ULCERS  *URINARY PROBLEMS  *BOWEL PROBLEMS  UNUSUAL RASH Items with * indicate a potential emergency and should be followed up as soon as possible.  One of the nurses will contact you 24 hours after your treatment. Please let the nurse know about any problems that you may have experienced. Feel free to call the clinic you have any questions or concerns. The clinic phone number is [phone].   I have been informed and understand all the instructions given to me. I know to contact the clinic, my physician, or go to the Emergency Department if any problems should occur. I do not have any questions at this time, but understand that I may call the clinic during office hours   should I have any questions or need assistance in obtaining follow up care.    __________________________________________  _____________  __________ Signature of Patient or Authorized Representative            Date                   Time    __________________________________________ Nurse's Signature    

## 2011-11-20 NOTE — Patient Instructions (Addendum)
IVF today due to diarrhea and possible dehydration  I will see you back in 2 weeks  Try Sitz baths for hemorrhoids, use Tucks medicated pads after each bowel movement and also can use preparation H for the hemorrhoids

## 2011-11-20 NOTE — Progress Notes (Signed)
OFFICE PROGRESS NOTE  CC Dr. Tommie Ard Dr. 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  PRIOR THERAPY:  #1 patient had a PET/CT performed that revealed Upper normal sized axillary and subpectoral nodes, corresponding to hypermetabolism at PET.CT of the abdomen and pelvis revealed Pelvic adenopathy, consistent with active disease. Equivocal abdominal retroperitoneal findings. Borderline adenopathy without hypermetabolism superiorly. A small more inferior retroperitoneal node demonstrates mild nonspecific hypermetabolism at PET. Scattered liver lesions, technically too small to characterize  but most likely benign cysts or hemangiomas.  #2 patient will begin systemic chemotherapy as she is symptomatic from her lymphoma. I have recommended CVP Rituxan combination given every 21 days for a total of 4-6 cycles. Started on 09/05/11  CURRENT THERAPY: s/p cycle 4 of CVP-R INTERVAL HISTORY: [name] 78 y.o. female returns for followup visit after chemotherapy. She is having diarrhea and weakness over the past several days. No nausea or fevers or chills, she is tired fatigued and is having difficulty ambulating due to weakness. She is not eating or drinking much. MEDICAL HISTORY: Past Medical History  Diagnosis Date  . Cancer 07/08/11    follicular B cell lymphoma  . TIA (transient ischemic attack)   . Arthritis   . Hypertension   . Esophageal abnormality     strictures  . Arthritis 09/04/2011  . Dehydration 11/20/2011  . Diarrhea 11/20/2011    ALLERGIES:  is allergic to codeine; other; penicillins; and sulfa antibiotics.  MEDICATIONS:  Current Outpatient Prescriptions  Medication Sig Dispense Refill  . acetaminophen (TYLENOL) 500 MG tablet Take 500 mg by mouth every 6 (six) hours as needed. For pain      . allopurinol (ZYLOPRIM) 100 MG tablet Take 1 tablet (100 mg total) by mouth 2 (two) times daily.  60 tablet  3  .  amLODipine-atorvastatin (CADUET) 5-40 MG per tablet Take 1 tablet by mouth daily.      . clopidogrel (PLAVIX) 75 MG tablet Take 75 mg by mouth daily.      . famotidine (PEPCID) 10 MG tablet Take 10 mg by mouth daily.      [name] lactulose (CHRONULAC) 10 GM/15ML solution Take 10 g by mouth 3 (three) times daily.      [name] lidocaine-prilocaine (EMLA) cream Apply topically as needed.  30 g  6  . loratadine (CLARITIN) 10 MG tablet Take 10 mg by mouth as needed.      . metoprolol (LOPRESSOR) 100 MG tablet Take 100 mg by mouth 2 (two) times daily.      . Multiple Vitamin (MULTIVITAMIN) tablet Take 1 tablet by mouth daily.      . ondansetron (ZOFRAN) 8 MG tablet Take 1 tablet two times a day starting the day after chemo for 3 days. Then take 1 tablet two times a day as needed for nausea or vomiting.  30 tablet  1  . predniSONE (DELTASONE) 20 MG tablet Take 8 tablets (160 mg total) by mouth daily. Take daily for 5 days  40 tablet  4  . pregabalin (LYRICA) 25 MG capsule Take 25 mg by mouth at bedtime.      . prochlorperazine (COMPAZINE) 10 MG tablet Take 1 tablet (10 mg total) by mouth every 6 (six) hours as needed (Nausea or vomiting).  30 tablet  1  . prochlorperazine (COMPAZINE) 25 MG suppository Place 1 suppository (25 mg total) rectally every 12 (twelve) hours as needed for nausea.  12 suppository  3  . sennosides-docusate  sodium (SENOKOT-S) 8.6-50 MG tablet Take 1 tablet by mouth daily.      . traMADol (ULTRAM) 50 MG tablet Take 50 mg by mouth every 6 (six) hours as needed.      . valsartan (DIOVAN) 160 MG tablet Take 160 mg by mouth daily.        SURGICAL HISTORY:  Past Surgical History  Procedure Date  . Appendectomy   . Parotidectomy w/ neck dissection total   . Tonsillectomy     REVIEW OF SYSTEMS:  Pertinent items are noted in HPI.   PHYSICAL EXAMINATION: Lymph nodes: Cervical, supraclavicular, and axillary nodes normal. Cardio: regular rate and rhythm, S1, S2 normal, no murmur, click, rub or  gallop GI: soft, non-tender; bowel sounds normal; no masses,  no organomegaly Extremities: extremities normal, atraumatic, no cyanosis or edema Neurologic: Grossly normal  ECOG PERFORMANCE STATUS: 1 - Symptomatic but completely ambulatory  Blood pressure 105/57, pulse 76, temperature 97.7 F (36.5 C), temperature source Oral, resp. rate 20, height 4\' 11"  (1.499 m), weight 110 lb 12.8 oz (50.259 kg).  LABORATORY DATA: Lab Results  Component Value Date   WBC 1.9* 11/20/2011   HGB 11.6 11/20/2011   HCT 33.6* 11/20/2011   MCV 87.3 11/20/2011   PLT 180 11/20/2011      Chemistry      Component Value Date/Time   NA 140 11/14/2011 0757   K 4.0 11/14/2011 0757   CL 103 11/14/2011 0757   CO2 24 11/14/2011 0757   BUN 9 11/14/2011 0757   CREATININE 0.74 11/14/2011 0757      Component Value Date/Time   CALCIUM 9.2 11/14/2011 0757   ALKPHOS 83 11/14/2011 0757   AST 18 11/14/2011 0757   ALT 11 11/14/2011 0757   BILITOT 0.6 11/14/2011 0757       RADIOGRAPHIC STUDIES:  ASSESSMENT: 78 year old female with  #1 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 She is status post staging scans which does reveals disease in the chest axilla as well as pelvic region. Patient is also symptomatic with abdominal and pelvic pain.Patient is now status post cycle #1 of CVP Rituxan. 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 She required enemas to evacuate her stools. She also does have lactulose as well as Senokot and milk of magnesia and MiraLAX to help her with her bowels.  #2 Patient is s/p cycle #3 of CVP R. I have reduced the dose of the vincristine. She received 1 mg.We discussed symptomatic management including patient using a good bowel regimen including doing Senokot and lactulose if needed. She also has MiraLAX prescriptions. For pain she is using tramadol and that is helping. She will try to keep yourself well hydrated.  3 dehydration due to diarrhea  from lactulose use  4. hemorrhoids  PLAN:  #1 Diarrhea and dehyration  give IVF   2. hemorrhoids symptomatic management  3 Return in 2 weeks for follow up for next cycle of chemotherapy  All questions were answered. The patient knows to call the clinic with any problems, questions or concerns. We can certainly see the patient much sooner if necessary.  I spent >15 minutes counseling the patient face to face. The total time spent in the appointment was 30 minutes.    Drue Second, MD Medical/Oncology [facility] [phone] (beeper) [phone] (Office)  11/20/2011, 12:23 PM

## 2011-11-21 ENCOUNTER — Telehealth: Payer: Self-pay | Admitting: Medical Oncology

## 2011-11-21 ENCOUNTER — Inpatient Hospital Stay (HOSPITAL_COMMUNITY)
Admission: EM | Admit: 2011-11-21 | Discharge: 2011-12-09 | DRG: 003 | Disposition: A | Discharge status: Other Acute Inpatient Hospital | Payer: Medicare Other | Attending: General Surgery | Admitting: General Surgery

## 2011-11-21 ENCOUNTER — Encounter (HOSPITAL_COMMUNITY): Payer: Self-pay | Admitting: *Deleted

## 2011-11-21 ENCOUNTER — Emergency Department (HOSPITAL_COMMUNITY): Payer: Medicare Other

## 2011-11-21 DIAGNOSIS — K8020 Calculus of gallbladder without cholecystitis without obstruction: Secondary | ICD-10-CM | POA: Diagnosis not present

## 2011-11-21 DIAGNOSIS — K5732 Diverticulitis of large intestine without perforation or abscess without bleeding: Principal | ICD-10-CM | POA: Diagnosis present

## 2011-11-21 DIAGNOSIS — D709 Neutropenia, unspecified: Secondary | ICD-10-CM | POA: Diagnosis not present

## 2011-11-21 DIAGNOSIS — K651 Peritoneal abscess: Secondary | ICD-10-CM | POA: Diagnosis present

## 2011-11-21 DIAGNOSIS — Z4682 Encounter for fitting and adjustment of non-vascular catheter: Secondary | ICD-10-CM | POA: Diagnosis not present

## 2011-11-21 DIAGNOSIS — G988 Other disorders of nervous system: Secondary | ICD-10-CM | POA: Diagnosis not present

## 2011-11-21 DIAGNOSIS — D630 Anemia in neoplastic disease: Secondary | ICD-10-CM | POA: Diagnosis present

## 2011-11-21 DIAGNOSIS — E46 Unspecified protein-calorie malnutrition: Secondary | ICD-10-CM | POA: Diagnosis present

## 2011-11-21 DIAGNOSIS — R899 Unspecified abnormal finding in specimens from other organs, systems and tissues: Secondary | ICD-10-CM | POA: Diagnosis not present

## 2011-11-21 DIAGNOSIS — E860 Dehydration: Secondary | ICD-10-CM

## 2011-11-21 DIAGNOSIS — D703 Neutropenia due to infection: Secondary | ICD-10-CM | POA: Diagnosis not present

## 2011-11-21 DIAGNOSIS — D649 Anemia, unspecified: Secondary | ICD-10-CM | POA: Diagnosis not present

## 2011-11-21 DIAGNOSIS — C8589 Other specified types of non-Hodgkin lymphoma, extranodal and solid organ sites: Secondary | ICD-10-CM | POA: Diagnosis present

## 2011-11-21 DIAGNOSIS — J90 Pleural effusion, not elsewhere classified: Secondary | ICD-10-CM | POA: Diagnosis present

## 2011-11-21 DIAGNOSIS — Z452 Encounter for adjustment and management of vascular access device: Secondary | ICD-10-CM | POA: Diagnosis not present

## 2011-11-21 DIAGNOSIS — Z930 Tracheostomy status: Secondary | ICD-10-CM | POA: Diagnosis not present

## 2011-11-21 DIAGNOSIS — A419 Sepsis, unspecified organism: Secondary | ICD-10-CM | POA: Diagnosis not present

## 2011-11-21 DIAGNOSIS — R6521 Severe sepsis with septic shock: Secondary | ICD-10-CM | POA: Diagnosis not present

## 2011-11-21 DIAGNOSIS — E876 Hypokalemia: Secondary | ICD-10-CM | POA: Diagnosis not present

## 2011-11-21 DIAGNOSIS — C8582 Other specified types of non-Hodgkin lymphoma, intrathoracic lymph nodes: Secondary | ICD-10-CM | POA: Diagnosis not present

## 2011-11-21 DIAGNOSIS — I4891 Unspecified atrial fibrillation: Secondary | ICD-10-CM | POA: Diagnosis not present

## 2011-11-21 DIAGNOSIS — R109 Unspecified abdominal pain: Secondary | ICD-10-CM | POA: Diagnosis not present

## 2011-11-21 DIAGNOSIS — J9600 Acute respiratory failure, unspecified whether with hypoxia or hypercapnia: Secondary | ICD-10-CM

## 2011-11-21 DIAGNOSIS — C8299 Follicular lymphoma, unspecified, extranodal and solid organ sites: Secondary | ICD-10-CM | POA: Diagnosis not present

## 2011-11-21 DIAGNOSIS — K7689 Other specified diseases of liver: Secondary | ICD-10-CM | POA: Diagnosis not present

## 2011-11-21 DIAGNOSIS — J438 Other emphysema: Secondary | ICD-10-CM | POA: Diagnosis not present

## 2011-11-21 DIAGNOSIS — C8583 Other specified types of non-Hodgkin lymphoma, intra-abdominal lymph nodes: Secondary | ICD-10-CM | POA: Diagnosis not present

## 2011-11-21 DIAGNOSIS — J9819 Other pulmonary collapse: Secondary | ICD-10-CM | POA: Diagnosis not present

## 2011-11-21 DIAGNOSIS — I509 Heart failure, unspecified: Secondary | ICD-10-CM | POA: Diagnosis not present

## 2011-11-21 DIAGNOSIS — R4182 Altered mental status, unspecified: Secondary | ICD-10-CM | POA: Diagnosis not present

## 2011-11-21 DIAGNOSIS — D72829 Elevated white blood cell count, unspecified: Secondary | ICD-10-CM | POA: Diagnosis not present

## 2011-11-21 DIAGNOSIS — K659 Peritonitis, unspecified: Secondary | ICD-10-CM | POA: Diagnosis not present

## 2011-11-21 DIAGNOSIS — K6389 Other specified diseases of intestine: Secondary | ICD-10-CM | POA: Diagnosis not present

## 2011-11-21 DIAGNOSIS — K650 Generalized (acute) peritonitis: Secondary | ICD-10-CM | POA: Diagnosis present

## 2011-11-21 DIAGNOSIS — K229 Disease of esophagus, unspecified: Secondary | ICD-10-CM

## 2011-11-21 DIAGNOSIS — R197 Diarrhea, unspecified: Secondary | ICD-10-CM

## 2011-11-21 DIAGNOSIS — I5021 Acute systolic (congestive) heart failure: Secondary | ICD-10-CM

## 2011-11-21 DIAGNOSIS — J811 Chronic pulmonary edema: Secondary | ICD-10-CM | POA: Diagnosis not present

## 2011-11-21 DIAGNOSIS — I359 Nonrheumatic aortic valve disorder, unspecified: Secondary | ICD-10-CM | POA: Diagnosis not present

## 2011-11-21 DIAGNOSIS — Z9889 Other specified postprocedural states: Secondary | ICD-10-CM | POA: Diagnosis not present

## 2011-11-21 DIAGNOSIS — K631 Perforation of intestine (nontraumatic): Secondary | ICD-10-CM

## 2011-11-21 DIAGNOSIS — K5720 Diverticulitis of large intestine with perforation and abscess without bleeding: Secondary | ICD-10-CM

## 2011-11-21 DIAGNOSIS — T797XXA Traumatic subcutaneous emphysema, initial encounter: Secondary | ICD-10-CM | POA: Diagnosis not present

## 2011-11-21 DIAGNOSIS — K5780 Diverticulitis of intestine, part unspecified, with perforation and abscess without bleeding: Secondary | ICD-10-CM

## 2011-11-21 DIAGNOSIS — J984 Other disorders of lung: Secondary | ICD-10-CM | POA: Diagnosis not present

## 2011-11-21 DIAGNOSIS — C8280 Other types of follicular lymphoma, unspecified site: Secondary | ICD-10-CM

## 2011-11-21 HISTORY — DX: Other complications of anesthesia, initial encounter: T88.59XA

## 2011-11-21 HISTORY — DX: Esophageal obstruction: K22.2

## 2011-11-21 HISTORY — DX: Gastro-esophageal reflux disease without esophagitis: K21.9

## 2011-11-21 HISTORY — DX: Nausea with vomiting, unspecified: R11.2

## 2011-11-21 HISTORY — DX: Adverse effect of unspecified anesthetic, initial encounter: T41.45XA

## 2011-11-21 HISTORY — DX: Other specified postprocedural states: Z98.890

## 2011-11-21 HISTORY — DX: Diverticulitis of large intestine with perforation and abscess without bleeding: K57.20

## 2011-11-21 LAB — CBC WITH DIFFERENTIAL/PLATELET
Band Neutrophils: 0 % (ref 0–10)
Basophils Relative: 0 % (ref 0–1)
Eosinophils Relative: 0 % (ref 0–5)
HCT: 29.1 % — ABNORMAL LOW (ref 36.0–46.0)
Hemoglobin: 10.1 g/dL — ABNORMAL LOW (ref 12.0–15.0)
Lymphs Abs: 0 10*3/uL — ABNORMAL LOW (ref 0.7–4.0)
MCH: 30.6 pg (ref 26.0–34.0)
MCHC: 34.7 g/dL (ref 30.0–36.0)
MCV: 88.2 fL (ref 78.0–100.0)
Neutrophils Relative %: 0 % — ABNORMAL LOW (ref 43–77)
RBC: 3.3 MIL/uL — ABNORMAL LOW (ref 3.87–5.11)
nRBC: 0 /100 WBC

## 2011-11-21 LAB — COMPREHENSIVE METABOLIC PANEL
AST: 13 U/L (ref 0–37)
CO2: 26 mEq/L (ref 19–32)
Chloride: 98 mEq/L (ref 96–112)
Creatinine, Ser: 0.51 mg/dL (ref 0.50–1.10)
GFR calc Af Amer: >90 mL/min (ref >90)
GFR calc non Af Amer: >90 mL/min (ref >90)
Glucose, Bld: 98 mg/dL (ref 70–99)
Total Bilirubin: 0.6 mg/dL (ref 0.3–1.2)

## 2011-11-21 MED ORDER — HYDROMORPHONE HCL PF 1 MG/ML IJ SOLN
1.0000 mg | Freq: Once | INTRAMUSCULAR | Status: AC
  Administered 2011-11-21: 1 mg via INTRAVENOUS
  Filled 2011-11-21 (×2): qty 1

## 2011-11-21 MED ORDER — HYDROMORPHONE HCL PF 1 MG/ML IJ SOLN
1.0000 mg | Freq: Once | INTRAMUSCULAR | Status: AC
  Administered 2011-11-21: 1 mg via INTRAVENOUS
  Filled 2011-11-21: qty 1

## 2011-11-21 MED ORDER — SODIUM CHLORIDE 0.9 % IV SOLN
1.0000 g | Freq: Three times a day (TID) | INTRAVENOUS | Status: DC
  Filled 2011-11-21 (×2): qty 1

## 2011-11-21 MED ORDER — ONDANSETRON HCL 4 MG/2ML IJ SOLN
4.0000 mg | Freq: Once | INTRAMUSCULAR | Status: AC
  Administered 2011-11-21: 4 mg via INTRAVENOUS
  Filled 2011-11-21: qty 2

## 2011-11-21 MED ORDER — METRONIDAZOLE IN NACL 5-0.79 MG/ML-% IV SOLN
500.0000 mg | Freq: Three times a day (TID) | INTRAVENOUS | Status: DC
  Administered 2011-11-21 – 2011-12-07 (×47): 500 mg via INTRAVENOUS
  Filled 2011-11-21 (×48): qty 100

## 2011-11-21 MED ORDER — HYDROMORPHONE HCL PF 1 MG/ML IJ SOLN: 1.0000 mg | INTRAMUSCULAR | Status: DC | PRN

## 2011-11-21 MED ORDER — SODIUM CHLORIDE 0.9 % IV BOLUS (SEPSIS)
1000.0000 mL | Freq: Once | INTRAVENOUS | Status: AC
  Administered 2011-11-21: 1000 mL via INTRAVENOUS

## 2011-11-21 MED ORDER — SODIUM CHLORIDE 0.9 % IV SOLN
Freq: Once | INTRAVENOUS | Status: AC
  Administered 2011-11-21: 999 mL via INTRAVENOUS

## 2011-11-21 MED ORDER — KCL IN DEXTROSE-NACL 40-5-0.45 MEQ/L-%-% IV SOLN
INTRAVENOUS | Status: DC
  Administered 2011-11-21: 23:00:00 via INTRAVENOUS
  Filled 2011-11-21 (×3): qty 1000

## 2011-11-21 MED ORDER — POTASSIUM CHLORIDE 10 MEQ/100ML IV SOLN
10.0000 meq | INTRAVENOUS | Status: AC
  Administered 2011-11-21 – 2011-11-22 (×2): 10 meq via INTRAVENOUS
  Filled 2011-11-21 (×2): qty 100

## 2011-11-21 MED ORDER — PANTOPRAZOLE SODIUM 40 MG IV SOLR
40.0000 mg | Freq: Every day | INTRAVENOUS | Status: DC
  Administered 2011-11-22 – 2011-12-08 (×17): 40 mg via INTRAVENOUS
  Filled 2011-11-21 (×19): qty 40

## 2011-11-21 MED ORDER — ONDANSETRON HCL 4 MG/2ML IJ SOLN: 4.0000 mg | Freq: Four times a day (QID) | INTRAMUSCULAR | Status: DC | PRN

## 2011-11-21 MED ORDER — CIPROFLOXACIN IN D5W 400 MG/200ML IV SOLN
400.0000 mg | Freq: Two times a day (BID) | INTRAVENOUS | Status: DC
  Administered 2011-11-21 – 2011-11-28 (×16): 400 mg via INTRAVENOUS
  Filled 2011-11-21 (×16): qty 200

## 2011-11-21 MED ORDER — POTASSIUM CHLORIDE 10 MEQ/100ML IV SOLN
10.0000 meq | INTRAVENOUS | Status: DC
  Administered 2011-11-21 (×2): 10 meq via INTRAVENOUS
  Filled 2011-11-21 (×2): qty 100

## 2011-11-21 NOTE — ED Notes (Signed)
Pt c/o abdominal pain, diarrhea, and dark tarry stools x 3 days.

## 2011-11-21 NOTE — ED Notes (Signed)
Attempt x1 to call report to ICU. States RN is at lunch and will call back.

## 2011-11-21 NOTE — H&P (Signed)
[name] is an 78 y.o. female.    Chief Complaint: abdominal pain, perforated diverticular disease with free air and abscess formation  HPI: Patient is a 78 yo WF who presents to the ER with 4-5 day hx of worsening abdominal pain.  Patient currently undergoing treatment for low grade B-cell lymphoma under the care of Dr. Park Breed.  Last chemoRx was one week ago.  Currently neutropenic with WBC of 0.4.  CTA in ER shows perforated diverticular disease with free air, extensive inflammatory changes, and developing abscesses.  Dr. Jeraldine Loots in ER asks general surgery to evaluate and manage.  Past Medical History  Diagnosis Date  . Cancer 07/08/11    follicular B cell lymphoma  . TIA (transient ischemic attack)   . Arthritis   . Hypertension   . Esophageal abnormality     strictures  . Arthritis 09/04/2011  . Dehydration 11/20/2011  . Diarrhea 11/20/2011  . Complication of anesthesia   . PONV (postoperative nausea and vomiting)     in the 1980's  . GERD (gastroesophageal reflux disease)   . Esophageal stricture     x 2    Past Surgical History  Procedure Date  . Appendectomy   . Parotidectomy w/ neck dissection total   . Tonsillectomy   . Abdominal hysterectomy     left ovaries    Family History  Problem Relation Age of Onset  . Cancer Mother 40    gastric  . Hypertension Father     heart disease  . Cancer Maternal Aunt 82    breast cancer  . Cancer Maternal Uncle 22    colon   Social History:  reports that she has never smoked. She has never used smokeless tobacco. She reports that she does not drink alcohol or use illicit drugs.  Allergies:  Allergies  Allergen Reactions  . Codeine Hives  . Other Nausea And Vomiting    anesthesia  . Penicillins Hives  . Sulfa Antibiotics Hives     (Not in a hospital admission)  Results for orders placed during the hospital encounter of 11/21/11 (from the past 48 hour(s))  CBC WITH DIFFERENTIAL     Status: Abnormal   Collection  Time   11/21/11  7:09 PM      Component Value Range Comment   WBC 0.4 (*) 4.0 - 10.5 K/uL    RBC 3.30 (*) 3.87 - 5.11 MIL/uL    Hemoglobin 10.1 (*) 12.0 - 15.0 g/dL    HCT 16.1 (*) 09.6 - 46.0 %    MCV 88.2  78.0 - 100.0 fL    MCH 30.6  26.0 - 34.0 pg    MCHC 34.7  30.0 - 36.0 g/dL    RDW 04.5  40.9 - 81.1 %    Platelets 223  150 - 400 K/uL    Neutrophils Relative 0 (*) 43 - 77 %    Lymphocytes Relative 0 (*) 12 - 46 %    Monocytes Relative 0 (*) 3 - 12 %    Eosinophils Relative 0  0 - 5 %    Basophils Relative 0  0 - 1 %    Band Neutrophils 0  0 - 10 %    nRBC 0  0 /100 WBC    Lymphs Abs 0.0 (*) 0.7 - 4.0 K/uL    Monocytes Absolute 0.0 (*) 0.1 - 1.0 K/uL    Eosinophils Absolute 0.0  0.0 - 0.7 K/uL    Basophils Absolute 0.0  0.0 -  0.1 K/uL    WBC Morphology NEUTROPHILS PRESENT     COMPREHENSIVE METABOLIC PANEL     Status: Abnormal   Collection Time   11/21/11  7:09 PM      Component Value Range Comment   Sodium 135  135 - 145 mEq/L    Potassium 2.5 (*) 3.5 - 5.1 mEq/L    Chloride 98  96 - 112 mEq/L    CO2 26  19 - 32 mEq/L    Glucose, Bld 98  70 - 99 mg/dL    BUN 10  6 - 23 mg/dL    Creatinine, Ser 9.60  0.50 - 1.10 mg/dL    Calcium 8.3 (*) 8.4 - 10.5 mg/dL    Total Protein 5.1 (*) 6.0 - 8.3 g/dL    Albumin 2.5 (*) 3.5 - 5.2 g/dL    AST 13  0 - 37 U/L    ALT 12  0 - 35 U/L    Alkaline Phosphatase 63  39 - 117 U/L    Total Bilirubin 0.6  0.3 - 1.2 mg/dL    GFR calc non Af Amer >90  >90 mL/min    GFR calc Af Amer >90  >90 mL/min   LIPASE, BLOOD     Status: Abnormal   Collection Time   11/21/11  7:09 PM      Component Value Range Comment   Lipase 9 (*) 11 - 59 U/L   OCCULT BLOOD, POC DEVICE     Status: Normal   Collection Time   11/21/11  7:33 PM      Component Value Range Comment   Fecal Occult Bld POSITIVE      Ct Abdomen Pelvis Wo Contrast  11/21/2011  *RADIOLOGY REPORT*  Clinical Data:  Black stool, stage IV lymphoma, pain all liver, weakness, diarrhea, nausea,  past history of hypertension, liver cyst question diverticulitis  CT ABDOMEN AND PELVIS WITHOUT CONTRAST  Technique:  Multidetector CT imaging of the abdomen and pelvis was performed following the standard protocol without intravenous contrast.  Oral contrast was administered.  Sagittal and coronal MPR images reconstructed from axial data set.  Comparison: 11/11/2011  Findings: Dependent atelectasis bilateral lung bases. Moderate sized hiatal hernia with retained contrast within both the hiatal hernia and within a distended distal esophagus, question related to gastroesophageal reflux. Within limits of a nonenhanced exam, no focal abnormalities of the liver, spleen, pancreas, kidneys, or adrenal glands.  Free intraperitoneal air compatible with perforated viscus new since prior exam. Numerous diverticula are identified at the descending and sigmoid colon. Extensive inflammatory process identified in the left pelvis including an extraluminal gas and fluid collections in the sigmoid mesocolon compatible with abscess. This measures up to 6.2 x 2.8 cm image 61 and extending 4.2 cm length. Significant wall thickening of the sigmoid colon, rectum, and numerous small bowel loops in the pelvis with associated free pelvic fluid.  Secondary dilatation of proximal small bowel loops. Stranding throughout the mesentery, sigmoid mesocolon and retroperitoneum including a portion of the presacral space. Appendix surgically absent by history with nonvisualization of the uterus as well. Largest residual right inguinal lymph node measures 18 x 9 mm image 75. Diffuse osseous demineralization with degenerative disc disease changes and retrolisthesis at L2-L3.  IMPRESSION: Free intraperitoneal air and fluid with extensive inflammatory process in pelvis. Wall thickening of the sigmoid colon with numerous sigmoid diverticula and presence of a 6.2 x 2.8 x 4.2 cm diameter abscess within the sigmoid mesocolon. This constellation of findings  likely represents perforated diverticulitis. Extensive associated inflammatory process of the pelvis with free fluid, wall thickening of the distal sigmoid colon, rectum, and pelvic small bowel loops, and edema throughout the mesentery and retroperitoneum. Secondary proximal small bowel dilatation.  Critical Value/emergent results were called by telephone at the time of interpretation on 11/21/2011 at 2021 hours to Dr. Jeraldine Loots, who verbally acknowledged these results.   Original Report Authenticated By: Lollie Marrow, M.D.     Review of Systems  Constitutional: Positive for fever and malaise/fatigue.  HENT: Negative.   Eyes: Negative.   Respiratory: Negative.   Cardiovascular: Negative.   Gastrointestinal: Positive for nausea, abdominal pain and constipation. Negative for heartburn, vomiting, diarrhea, blood in stool and melena.  Genitourinary: Positive for frequency.  Musculoskeletal: Negative.   Skin: Negative.   Neurological: Positive for weakness.  Endo/Heme/Allergies: Bruises/bleeds easily.       Neutropenic - on chemotherapy  Psychiatric/Behavioral: Negative.     Blood pressure 118/54, pulse 102, temperature 98.3 F (36.8 C), temperature source Oral, resp. rate 14, SpO2 95.00%. Physical Exam  Constitutional: She is oriented to person, place, and time. She appears distressed.       Thin, weak  HENT:  Head: Normocephalic and atraumatic.  Right Ear: External ear normal.  Left Ear: External ear normal.  Mouth/Throat: Oropharynx is clear and moist.  Eyes: Conjunctivae and EOM are normal. Pupils are equal, round, and reactive to light. No scleral icterus.  Neck: Normal range of motion. Neck supple. No tracheal deviation present. No thyromegaly present.  Cardiovascular: Normal rate, regular rhythm and normal heart sounds.   No murmur heard. Respiratory: Effort normal and breath sounds normal. No respiratory distress. She has no wheezes. She has no rales.  GI: She exhibits  distension. She exhibits no mass. There is tenderness (diffuse, marked). There is rebound and guarding.  Musculoskeletal: Normal range of motion. She exhibits edema (lower extremities). She exhibits no tenderness.  Lymphadenopathy:    She has no cervical adenopathy.  Neurological: She is alert and oriented to person, place, and time.  Skin: Skin is warm and dry.  Psychiatric: She has a normal mood and affect. Her behavior is normal. Judgment and thought content normal.     Assessment/Plan 1.  Perforated diverticular disease with peritonitis  - begin IV abx's  - NPO  - plan laparotomy with resection and colostomy in AM 8/31 2.  Dehydration  - IV fluid resuscitation tonight in stepdown unit 3.  Hypokalemia  - replete KCL 4.  Neutropenia  - medical oncology contacted and will manage  Discussed indications for surgical intervention with patient and family at bedside.  Patient's son is present and is a Systems developer in [employer].  Patient at marked increased risk for laparotomy given age, malignancy, chemoRx with neutropenia, co-mordidities, and relative anticoagulation.  Discussed options of medical management versus surgical intervention.  With the extent of contamination in the peritoneal cavity, I feel she requires laparotomy if she is to survive.  The risks and benefits of the procedure have been discussed at length with the patient.  The patient understands the proposed procedure, potential alternative treatments, and the course of recovery to be expected.  All of the patient's questions have been answered at this time.  The patient and her family wish to proceed with surgery.  Velora Heckler, MD, [facility] Office: [phone]    Cheick Suhr Judie Petit 11/21/2011, 9:46 PM

## 2011-11-21 NOTE — ED Notes (Signed)
Pt. Is tachy at 127

## 2011-11-21 NOTE — ED Notes (Signed)
Patient transported to CT 

## 2011-11-21 NOTE — ED Notes (Signed)
Dr. Jeraldine Loots at bedside. Pt returned from CT scan.

## 2011-11-21 NOTE — ED Provider Notes (Signed)
History     CSN: [id number]  Arrival date & time 11/21/11  1615   First MD Initiated Contact with Patient 11/21/11 1736      Chief Complaint  Patient presents with  . Abdominal Pain  . dark tarry stools     HPI The patient presents with concerns of abdominal pain, bowel habit changes, chills, generalized discomfort. Notably, the patient has B cell lymphoma and is currently getting chemotherapy.  Her last session was one week ago.  Following that she had a shot of Neulasta. The day after that she developed diarrhea, which persisted for 3 days with innumerable episodes of stool in gradually increasing abdominal pain.  At that symptoms persisted, the patient was advised to stop her lactulose, which she has done.  Since that time she's had one solid stool, continued to have increasing abdominal pain, chills, generalized discomfort.  The pain is sharp, extreme, not relieved with anything, worse with palpation.  The patient also has nausea, anorexia.  She denies any chest pain, dyspnea, confusion, disorientation. Past Medical History  Diagnosis Date  . Cancer 07/08/11    follicular B cell lymphoma  . TIA (transient ischemic attack)   . Arthritis   . Hypertension   . Esophageal abnormality     strictures  . Arthritis 09/04/2011  . Dehydration 11/20/2011  . Diarrhea 11/20/2011    Past Surgical History  Procedure Date  . Appendectomy   . Parotidectomy w/ neck dissection total   . Tonsillectomy     Family History  Problem Relation Age of Onset  . Cancer Mother 6    gastric  . Hypertension Father     heart disease  . Cancer Maternal Aunt 35    breast cancer  . Cancer Maternal Uncle 58    colon    History  Substance Use Topics  . Smoking status: Never Smoker   . Smokeless tobacco: Never Used  . Alcohol Use: No    OB History    Grav Para Term Preterm Abortions TAB SAB Ect Mult Living                  Review of Systems  Constitutional:       HPI  HENT:       HPI  otherwise negative  Eyes: Negative.   Respiratory:       HPI, otherwise negative  Cardiovascular:       HPI, otherwise nmegative  Gastrointestinal: Positive for nausea, vomiting, diarrhea and rectal pain.  Genitourinary:       HPI, otherwise negative  Musculoskeletal:       HPI, otherwise negative  Skin: Negative.   Neurological: Negative for syncope.    Allergies  Codeine; Other; Penicillins; and Sulfa antibiotics  Home Medications   Current Outpatient Rx  Name Route Sig Dispense Refill  . ACETAMINOPHEN 500 MG PO TABS Oral Take 500 mg by mouth every 6 (six) hours as needed. For pain    . ALLOPURINOL 100 MG PO TABS Oral Take 100 mg by mouth 2 (two) times daily.    [name] AMLODIPINE-ATORVASTATIN 5-40 MG PO TABS Oral Take 1 tablet by mouth daily.    [name] CLOPIDOGREL BISULFATE 75 MG PO TABS Oral Take 75 mg by mouth daily.    [name] FAMOTIDINE 10 MG PO TABS Oral Take 10 mg by mouth daily.    [name] LACTULOSE 10 GM/15ML PO SOLN Oral Take 10 g by mouth 3 (three) times daily.    [name] LIDOCAINE-PRILOCAINE 2.5-2.5 %  EX CREA Topical Apply 1 application topically as needed. Pain    . LORATADINE 10 MG PO TABS Oral Take 10 mg by mouth as needed. Allergies    . METOPROLOL TARTRATE 100 MG PO TABS Oral Take 50 mg by mouth 2 (two) times daily.     [name] PREDNISONE 20 MG PO TABS Oral Take 160 mg by mouth daily. Take daily for 5 days    . PREGABALIN 25 MG PO CAPS Oral Take 25 mg by mouth at bedtime.    [name] PROCHLORPERAZINE MALEATE 10 MG PO TABS Oral Take 10 mg by mouth every 6 (six) hours as needed. Nausea    . VALSARTAN 160 MG PO TABS Oral Take 160 mg by mouth daily.      BP 123/46  Pulse 65  Temp 98.3 F (36.8 C) (Oral)  Resp 15  SpO2 98%  Physical Exam  Nursing note and vitals reviewed. Constitutional: She is oriented to person, place, and time. She appears well-developed and well-nourished.  HENT:  Head: Normocephalic and atraumatic.  Eyes: Conjunctivae are normal. Right eye exhibits no discharge. Left eye  exhibits no discharge.  Neck: No tracheal deviation present.  Cardiovascular: Normal rate and regular rhythm.   Pulmonary/Chest: Effort normal. No stridor. No respiratory distress.  Abdominal: She exhibits distension. There is generalized tenderness. There is guarding. There is no rebound.  Musculoskeletal: She exhibits no edema.  Neurological: She is alert and oriented to person, place, and time. No cranial nerve deficit.  Skin: Skin is warm and dry.  Psychiatric: She has a normal mood and affect.    ED Course  Procedures (including critical care time)  Labs Reviewed  CBC WITH DIFFERENTIAL - Abnormal; Notable for the following:    WBC 0.4 (*)     RBC 3.30 (*)     Hemoglobin 10.1 (*)     HCT 29.1 (*)     Neutrophils Relative 0 (*)     Lymphocytes Relative 0 (*)     Monocytes Relative 0 (*)     Lymphs Abs 0.0 (*)     Monocytes Absolute 0.0 (*)     All other components within normal limits  COMPREHENSIVE METABOLIC PANEL - Abnormal; Notable for the following:    Potassium 2.5 (*)     Calcium 8.3 (*)     Total Protein 5.1 (*)     Albumin 2.5 (*)     All other components within normal limits  LIPASE, BLOOD - Abnormal; Notable for the following:    Lipase 9 (*)     All other components within normal limits  OCCULT BLOOD, POC DEVICE  CULTURE, BLOOD (SINGLE)   Ct Abdomen Pelvis Wo Contrast  11/21/2011  *RADIOLOGY REPORT*  Clinical Data:  Black stool, stage IV lymphoma, pain all liver, weakness, diarrhea, nausea, past history of hypertension, liver cyst question diverticulitis  CT ABDOMEN AND PELVIS WITHOUT CONTRAST  Technique:  Multidetector CT imaging of the abdomen and pelvis was performed following the standard protocol without intravenous contrast.  Oral contrast was administered.  Sagittal and coronal MPR images reconstructed from axial data set.  Comparison: 11/11/2011  Findings: Dependent atelectasis bilateral lung bases. Moderate sized hiatal hernia with retained contrast  within both the hiatal hernia and within a distended distal esophagus, question related to gastroesophageal reflux. Within limits of a nonenhanced exam, no focal abnormalities of the liver, spleen, pancreas, kidneys, or adrenal glands.  Free intraperitoneal air compatible with perforated viscus new since prior exam. Numerous diverticula are  identified at the descending and sigmoid colon. Extensive inflammatory process identified in the left pelvis including an extraluminal gas and fluid collections in the sigmoid mesocolon compatible with abscess. This measures up to 6.2 x 2.8 cm image 61 and extending 4.2 cm length. Significant wall thickening of the sigmoid colon, rectum, and numerous small bowel loops in the pelvis with associated free pelvic fluid.  Secondary dilatation of proximal small bowel loops. Stranding throughout the mesentery, sigmoid mesocolon and retroperitoneum including a portion of the presacral space. Appendix surgically absent by history with nonvisualization of the uterus as well. Largest residual right inguinal lymph node measures 18 x 9 mm image 75. Diffuse osseous demineralization with degenerative disc disease changes and retrolisthesis at L2-L3.  IMPRESSION: Free intraperitoneal air and fluid with extensive inflammatory process in pelvis. Wall thickening of the sigmoid colon with numerous sigmoid diverticula and presence of a 6.2 x 2.8 x 4.2 cm diameter abscess within the sigmoid mesocolon. This constellation of findings likely represents perforated diverticulitis. Extensive associated inflammatory process of the pelvis with free fluid, wall thickening of the distal sigmoid colon, rectum, and pelvic small bowel loops, and edema throughout the mesentery and retroperitoneum. Secondary proximal small bowel dilatation.  Critical Value/emergent results were called by telephone at the time of interpretation on 11/21/2011 at 2021 hours to Dr. Jeraldine Loots, who verbally acknowledged these results.    Original Report Authenticated By: Lollie Marrow, M.D.      No diagnosis found.  Cardiac 65 sinus rhythm normal Pulse ox 98% room air normal  I reviewed the radiographic findings with the radiologist.  As discussed lab results with he staff, as the patient was found to be neutropenic.  The patient's son is an oncologist, we discussed her findings at length. MDM  This pleasant elderly female, currently getting treatment for B-cell lymphoma, with a last 2 therapy session 1 week ago now presents with increasing abdominal pain, bowel habit changes, chills.  Given her chemotherapy her some suspicion of neutropenia, and her abdominal pain is concerning for diverticulitis, possibly complicated by immuno deficiency.  Though the patient's labs are unremarkable, her abdominal pain is concerning initially, and she was resuscitated with IV fluids, check her labs return should there be provided antibiotics for neutropenic, though she was afebrile.  She required Dilaudid and Zofran for symptom control, and once the CT results were discussed with the radiologist spoke with our surgeon on-call, who sutured the patient from the emergency department.  She had already received empiric antibiotics, fluids, analgesics.  CRITICAL CARE Performed by: Gerhard Munch   Total critical care time: 40  Critical care time was exclusive of separately billable procedures and treating other patients.  Critical care was necessary to treat or prevent imminent or life-threatening deterioration.  Critical care was time spent personally by me on the following activities: development of treatment plan with patient and/or surrogate as well as nursing, discussions with consultants, evaluation of patient's response to treatment, examination of patient, obtaining history from patient or surrogate, ordering and performing treatments and interventions, ordering and review of laboratory studies, ordering and review of radiographic  studies, pulse oximetry and re-evaluation of patient's condition.         Gerhard Munch, MD 11/21/11 2206

## 2011-11-21 NOTE — ED Notes (Signed)
Pt mainly concerned about black stool onset today. Pt has stage 4 lymphoma. Feels all over pain, weak, diarrhea and nausea. A&Ox4, vital sign stable, Skin are pink and warm, strong pulse,  ABC intact. NAD noted. Hx of hemorraghic.

## 2011-11-21 NOTE — Telephone Encounter (Signed)
Patient [name] stating that she was having increased pain in her abdomen and this morning had had a dark, black, tarry stool.  Contacted patient for more information.  Per patient, c/o increased pain in her lower abdomen, dark, tarry stool which she had taken a sample of.  Requesting to bring sample to [facility] tomorrow, informed patient that there would not be lab staff in the cancer center until Tuesday.  Suggested that patient should be seen in urgent care/emergency room as these were acute onset symptoms.  "Dr. Welton Flakes did say I should go talk to my GI doctor about these upper GI symptoms that I have been having but nothing about these lower GI symptoms."  Patient to contact Dr. Levy Pupa office upon hanging up with this nurse for their suggestion as to what she should do.  No further questions at this time.

## 2011-11-22 ENCOUNTER — Encounter (HOSPITAL_COMMUNITY): Payer: Self-pay | Admitting: Anesthesiology

## 2011-11-22 ENCOUNTER — Other Ambulatory Visit: Payer: Self-pay

## 2011-11-22 ENCOUNTER — Inpatient Hospital Stay (HOSPITAL_COMMUNITY): Payer: Medicare Other

## 2011-11-22 ENCOUNTER — Encounter (HOSPITAL_COMMUNITY)
Admission: EM | Disposition: A | Discharge status: Nursing Home (Includes ICF) | Payer: Self-pay | Source: Home / Self Care

## 2011-11-22 ENCOUNTER — Inpatient Hospital Stay (HOSPITAL_COMMUNITY): Payer: Medicare Other | Admitting: Anesthesiology

## 2011-11-22 DIAGNOSIS — D703 Neutropenia due to infection: Secondary | ICD-10-CM

## 2011-11-22 DIAGNOSIS — J9600 Acute respiratory failure, unspecified whether with hypoxia or hypercapnia: Secondary | ICD-10-CM | POA: Diagnosis not present

## 2011-11-22 DIAGNOSIS — K631 Perforation of intestine (nontraumatic): Secondary | ICD-10-CM

## 2011-11-22 DIAGNOSIS — A419 Sepsis, unspecified organism: Secondary | ICD-10-CM

## 2011-11-22 DIAGNOSIS — D709 Neutropenia, unspecified: Secondary | ICD-10-CM

## 2011-11-22 HISTORY — PX: COLOSTOMY REVISION: SHX5232

## 2011-11-22 HISTORY — DX: Sepsis, unspecified organism: A41.9

## 2011-11-22 HISTORY — PX: COLOSTOMY: SHX63

## 2011-11-22 HISTORY — PX: LAPAROTOMY: SHX154

## 2011-11-22 LAB — BLOOD GAS, ARTERIAL
Acid-base deficit: 8 mmol/L — ABNORMAL HIGH (ref 0.0–2.0)
Acid-base deficit: 8 mmol/L — ABNORMAL HIGH (ref 0.0–2.0)
Acid-base deficit: 8.3 mmol/L — ABNORMAL HIGH (ref 0.0–2.0)
Bicarbonate: 16.6 mEq/L — ABNORMAL LOW (ref 20.0–24.0)
Bicarbonate: 14.2 mEq/L — ABNORMAL LOW (ref 20.0–24.0)
Bicarbonate: 16.1 mEq/L — ABNORMAL LOW (ref 20.0–24.0)
Drawn by: 11249
Drawn by: 340271
Drawn by: 340271
FIO2: 0.4 %
FIO2: 0.55 %
FIO2: 0.4 %
FIO2: 0.4 %
MECHVT: 350 mL
O2 Saturation: 94.1 %
O2 Saturation: 98.9 %
PEEP: 5 cmH2O
PEEP: 5 cmH2O
Patient temperature: 98.6
Patient temperature: 37
RATE: 18 resp/min
RATE: 18 resp/min
RATE: 18 resp/min
pCO2 arterial: 34.6 mmHg — ABNORMAL LOW (ref 35.0–45.0)
pCO2 arterial: 44.3 mmHg (ref 35.0–45.0)
pO2, Arterial: 79.7 mmHg — ABNORMAL LOW (ref 80.0–100.0)
pO2, Arterial: 98.9 mmHg (ref 80.0–100.0)
pO2, Arterial: 82.3 mmHg (ref 80.0–100.0)
pO2, Arterial: 114 mmHg — ABNORMAL HIGH (ref 80.0–100.0)

## 2011-11-22 LAB — CBC
HCT: 29.6 % — ABNORMAL LOW (ref 36.0–46.0)
Hemoglobin: 9.7 g/dL — ABNORMAL LOW (ref 12.0–15.0)
MCH: 29.6 pg (ref 26.0–34.0)
MCH: 30.3 pg (ref 26.0–34.0)
MCV: 90.2 fL (ref 78.0–100.0)
MCV: 89 fL (ref 78.0–100.0)
Platelets: 178 10*3/uL (ref 150–400)
RBC: 3.28 MIL/uL — ABNORMAL LOW (ref 3.87–5.11)
RBC: 2.9 MIL/uL — ABNORMAL LOW (ref 3.87–5.11)
RDW: 15.2 % (ref 11.5–15.5)
WBC: 1.6 10*3/uL — ABNORMAL LOW (ref 4.0–10.5)

## 2011-11-22 LAB — CARBOXYHEMOGLOBIN
Carboxyhemoglobin: 0.7 % (ref 0.5–1.5)
O2 Saturation: 75.7 %

## 2011-11-22 LAB — BASIC METABOLIC PANEL
BUN: 6 mg/dL (ref 6–23)
BUN: 5 mg/dL — ABNORMAL LOW (ref 6–23)
CO2: 19 mEq/L (ref 19–32)
CO2: 17 mEq/L — ABNORMAL LOW (ref 19–32)
Calcium: 5.9 mg/dL — CL (ref 8.4–10.5)
Calcium: 5.8 mg/dL — CL (ref 8.4–10.5)
Chloride: 101 mEq/L (ref 96–112)
Chloride: 101 mEq/L (ref 96–112)
Creatinine, Ser: 0.54 mg/dL (ref 0.50–1.10)
GFR calc Af Amer: >90 mL/min (ref >90)
GFR calc non Af Amer: 82 mL/min — ABNORMAL LOW (ref >90)
GFR calc non Af Amer: >90 mL/min (ref >90)
Glucose, Bld: 211 mg/dL — ABNORMAL HIGH (ref 70–99)
Glucose, Bld: 240 mg/dL — ABNORMAL HIGH (ref 70–99)
Glucose, Bld: 208 mg/dL — ABNORMAL HIGH (ref 70–99)
Potassium: 3 mEq/L — ABNORMAL LOW (ref 3.5–5.1)
Potassium: 4.3 mEq/L (ref 3.5–5.1)
Sodium: 132 mEq/L — ABNORMAL LOW (ref 135–145)

## 2011-11-22 LAB — URINE MICROSCOPIC-ADD ON

## 2011-11-22 LAB — MRSA PCR SCREENING: MRSA by PCR: NEGATIVE

## 2011-11-22 LAB — URINALYSIS, ROUTINE W REFLEX MICROSCOPIC
Bilirubin Urine: NEGATIVE
Ketones, ur: NEGATIVE mg/dL
Specific Gravity, Urine: 1.015 (ref 1.005–1.030)
Urobilinogen, UA: 1 mg/dL (ref 0.0–1.0)

## 2011-11-22 LAB — ABO/RH: ABO/RH(D): A POS

## 2011-11-22 LAB — TYPE AND SCREEN: ABO/RH(D): A POS

## 2011-11-22 LAB — DIC (DISSEMINATED INTRAVASCULAR COAGULATION)PANEL
Prothrombin Time: 17.4 seconds — ABNORMAL HIGH (ref 11.6–15.2)
Smear Review: NONE SEEN

## 2011-11-22 LAB — TROPONIN I: Troponin I: <0.3 ng/mL (ref <0.30)

## 2011-11-22 LAB — CREATININE, SERUM: Creatinine, Ser: 0.54 mg/dL (ref 0.50–1.10)

## 2011-11-22 LAB — FIBRINOGEN: Fibrinogen: 535 mg/dL — ABNORMAL HIGH (ref 204–475)

## 2011-11-22 SURGERY — LAPAROTOMY, EXPLORATORY
Anesthesia: General | Site: Abdomen | Wound class: Dirty or Infected

## 2011-11-22 MED ORDER — ETOMIDATE 2 MG/ML IV SOLN
INTRAVENOUS | Status: AC
  Filled 2011-11-22: qty 20

## 2011-11-22 MED ORDER — SODIUM CHLORIDE 0.9 % IV SOLN
50.0000 ug/h | INTRAVENOUS | Status: DC
  Administered 2011-11-22: 30 ug/h via INTRAVENOUS
  Administered 2011-11-24: 50 ug/h via INTRAVENOUS
  Filled 2011-11-22 (×4): qty 50

## 2011-11-22 MED ORDER — HYDROCORTISONE SOD SUCCINATE 100 MG IJ SOLR
50.0000 mg | Freq: Four times a day (QID) | INTRAMUSCULAR | Status: DC
  Administered 2011-11-22 – 2011-11-23 (×3): 50 mg via INTRAVENOUS
  Administered 2011-11-23: 12:00:00 via INTRAVENOUS
  Administered 2011-11-23 – 2011-11-25 (×7): 50 mg via INTRAVENOUS
  Filled 2011-11-22: qty 2
  Filled 2011-11-22 (×2): qty 1
  Filled 2011-11-22: qty 2
  Filled 2011-11-22 (×5): qty 1
  Filled 2011-11-22: qty 2
  Filled 2011-11-22 (×3): qty 1
  Filled 2011-11-22: qty 2
  Filled 2011-11-22: qty 1
  Filled 2011-11-22: qty 2
  Filled 2011-11-22: qty 1

## 2011-11-22 MED ORDER — SODIUM CHLORIDE 0.9 % IV BOLUS (SEPSIS): 500.0000 mL | INTRAVENOUS | Status: DC | PRN

## 2011-11-22 MED ORDER — MIDAZOLAM HCL 5 MG/ML IJ SOLN
INTRAMUSCULAR | Status: AC
  Filled 2011-11-22: qty 1

## 2011-11-22 MED ORDER — DOBUTAMINE IN D5W 4-5 MG/ML-% IV SOLN: 2.5000 ug/kg/min | INTRAVENOUS | Status: DC | PRN

## 2011-11-22 MED ORDER — ENOXAPARIN SODIUM 40 MG/0.4ML ~~LOC~~ SOLN
40.0000 mg | SUBCUTANEOUS | Status: DC
  Administered 2011-11-23 – 2011-12-03 (×11): 40 mg via SUBCUTANEOUS
  Filled 2011-11-22 (×12): qty 0.4

## 2011-11-22 MED ORDER — HETASTARCH-ELECTROLYTES 6 % IV SOLN
INTRAVENOUS | Status: DC | PRN
  Administered 2011-11-22: 11:00:00 via INTRAVENOUS

## 2011-11-22 MED ORDER — POTASSIUM CHLORIDE 10 MEQ/50ML IV SOLN
10.0000 meq | INTRAVENOUS | Status: AC
  Administered 2011-11-22 (×4): 10 meq via INTRAVENOUS
  Filled 2011-11-22 (×4): qty 50

## 2011-11-22 MED ORDER — FLUCONAZOLE IN SODIUM CHLORIDE 400-0.9 MG/200ML-% IV SOLN
400.0000 mg | Freq: Once | INTRAVENOUS | Status: AC
  Administered 2011-11-22: 400 mg via INTRAVENOUS
  Filled 2011-11-22: qty 200

## 2011-11-22 MED ORDER — FENTANYL CITRATE 0.05 MG/ML IJ SOLN
INTRAMUSCULAR | Status: AC
  Filled 2011-11-22: qty 2

## 2011-11-22 MED ORDER — NOREPINEPHRINE BITARTRATE 1 MG/ML IJ SOLN
2.0000 ug/min | INTRAVENOUS | Status: DC
  Filled 2011-11-22: qty 4

## 2011-11-22 MED ORDER — HYDROCORTISONE SOD SUCCINATE 100 MG PF FOR IT USE
50.0000 mg | Freq: Four times a day (QID) | INTRAMUSCULAR | Status: DC
  Filled 2011-11-22 (×2): qty 0.5

## 2011-11-22 MED ORDER — SODIUM BICARBONATE 8.4 % IV SOLN
INTRAVENOUS | Status: AC
  Filled 2011-11-22: qty 100

## 2011-11-22 MED ORDER — PHENYLEPHRINE HCL 10 MG/ML IJ SOLN
30.0000 ug/min | INTRAVENOUS | Status: DC
  Administered 2011-11-22: 300 ug/min via INTRAVENOUS
  Administered 2011-11-22: 280 ug/min via INTRAVENOUS
  Administered 2011-11-22: 15 ug/min via INTRAVENOUS
  Filled 2011-11-22 (×2): qty 4

## 2011-11-22 MED ORDER — SODIUM CHLORIDE 0.9 % IV SOLN
1.0000 g | Freq: Once | INTRAVENOUS | Status: AC
  Administered 2011-11-22: 1 g via INTRAVENOUS
  Filled 2011-11-22: qty 10

## 2011-11-22 MED ORDER — SODIUM CHLORIDE 0.9 % IV BOLUS (SEPSIS)
1000.0000 mL | Freq: Once | INTRAVENOUS | Status: AC
  Administered 2011-11-22: 1000 mL via INTRAVENOUS

## 2011-11-22 MED ORDER — SODIUM CHLORIDE 0.9 % IV SOLN
2.0000 g | Freq: Once | INTRAVENOUS | Status: AC
  Administered 2011-11-22: 2 g via INTRAVENOUS
  Filled 2011-11-22: qty 20

## 2011-11-22 MED ORDER — PROMETHAZINE HCL 25 MG/ML IJ SOLN: 6.2500 mg | INTRAMUSCULAR | Status: DC | PRN

## 2011-11-22 MED ORDER — BIOTENE DRY MOUTH MT LIQD
15.0000 mL | Freq: Four times a day (QID) | OROMUCOSAL | Status: DC
  Administered 2011-11-22 – 2011-12-09 (×67): 15 mL via OROMUCOSAL

## 2011-11-22 MED ORDER — SODIUM BICARBONATE 8.4 % IV SOLN
INTRAVENOUS | Status: DC | PRN
  Administered 2011-11-22 (×2): 50 meq via INTRAVENOUS

## 2011-11-22 MED ORDER — SODIUM CHLORIDE 0.9 % IV BOLUS (SEPSIS): 500.0000 mL | Freq: Once | INTRAVENOUS | Status: DC

## 2011-11-22 MED ORDER — FENTANYL CITRATE 0.05 MG/ML IJ SOLN
INTRAMUSCULAR | Status: DC | PRN
  Administered 2011-11-22: 50 ug via INTRAVENOUS
  Administered 2011-11-22: 200 ug via INTRAVENOUS
  Administered 2011-11-22: 100 ug via INTRAVENOUS
  Administered 2011-11-22: 50 ug via INTRAVENOUS
  Administered 2011-11-22: 100 ug via INTRAVENOUS

## 2011-11-22 MED ORDER — SODIUM CHLORIDE 0.9 % IV SOLN
10.0000 ug/h | INTRAVENOUS | Status: DC
  Filled 2011-11-22: qty 50

## 2011-11-22 MED ORDER — MIDAZOLAM HCL 2 MG/2ML IJ SOLN: 2.0000 mg | Freq: Once | INTRAMUSCULAR | Status: DC

## 2011-11-22 MED ORDER — METRONIDAZOLE IN NACL 5-0.79 MG/ML-% IV SOLN: 500.0000 mg | Freq: Once | INTRAVENOUS | Status: DC

## 2011-11-22 MED ORDER — SUCCINYLCHOLINE CHLORIDE 20 MG/ML IJ SOLN
INTRAMUSCULAR | Status: AC
  Filled 2011-11-22: qty 10

## 2011-11-22 MED ORDER — SODIUM CHLORIDE 0.9 % IR SOLN
Status: DC | PRN
  Administered 2011-11-22: 2000 mL

## 2011-11-22 MED ORDER — CISATRACURIUM BESYLATE (PF) 10 MG/5ML IV SOLN
INTRAVENOUS | Status: DC | PRN
  Administered 2011-11-22: 10 mg via INTRAVENOUS
  Administered 2011-11-22: 4 mg via INTRAVENOUS
  Administered 2011-11-22 (×2): 6 mg via INTRAVENOUS

## 2011-11-22 MED ORDER — KCL IN DEXTROSE-NACL 40-5-0.45 MEQ/L-%-% IV SOLN
INTRAVENOUS | Status: DC
  Administered 2011-11-22: 100 mL via INTRAVENOUS
  Administered 2011-11-23: 08:00:00 via INTRAVENOUS
  Filled 2011-11-22 (×4): qty 1000

## 2011-11-22 MED ORDER — LIDOCAINE HCL (CARDIAC) 20 MG/ML IV SOLN
INTRAVENOUS | Status: AC
  Filled 2011-11-22: qty 5

## 2011-11-22 MED ORDER — SODIUM CHLORIDE 0.9 % IV SOLN: INTRAVENOUS | Status: DC

## 2011-11-22 MED ORDER — FENTANYL CITRATE 0.05 MG/ML IJ SOLN: 25.0000 ug | INTRAMUSCULAR | Status: DC | PRN

## 2011-11-22 MED ORDER — LACTATED RINGERS IV SOLN: INTRAVENOUS | Status: DC

## 2011-11-22 MED ORDER — FENTANYL CITRATE 0.05 MG/ML IJ SOLN: 100.0000 ug | Freq: Once | INTRAMUSCULAR | Status: DC

## 2011-11-22 MED ORDER — MIDAZOLAM HCL 5 MG/ML IJ SOLN
INTRAMUSCULAR | Status: AC
  Administered 2011-11-22: 2 mg
  Filled 2011-11-22: qty 1

## 2011-11-22 MED ORDER — MIDAZOLAM HCL 2 MG/2ML IJ SOLN
2.0000 mg | INTRAMUSCULAR | Status: DC | PRN
  Administered 2011-11-22: 2 mg via INTRAVENOUS

## 2011-11-22 MED ORDER — MIDAZOLAM HCL 5 MG/ML IJ SOLN
INTRAMUSCULAR | Status: AC
  Administered 2011-11-22: 5 mg
  Filled 2011-11-22: qty 1

## 2011-11-22 MED ORDER — NOREPINEPHRINE BITARTRATE 1 MG/ML IJ SOLN
2.0000 ug/min | INTRAMUSCULAR | Status: DC | PRN
  Filled 2011-11-22: qty 4

## 2011-11-22 MED ORDER — ALBUTEROL SULFATE HFA 108 (90 BASE) MCG/ACT IN AERS
4.0000 | INHALATION_SPRAY | RESPIRATORY_TRACT | Status: DC
  Administered 2011-11-22 – 2011-11-24 (×13): 4 via RESPIRATORY_TRACT
  Filled 2011-11-22: qty 6.7

## 2011-11-22 MED ORDER — CHLORHEXIDINE GLUCONATE 0.12 % MT SOLN
15.0000 mL | Freq: Two times a day (BID) | OROMUCOSAL | Status: DC
  Administered 2011-11-22 – 2011-12-09 (×35): 15 mL via OROMUCOSAL
  Filled 2011-11-22 (×36): qty 15

## 2011-11-22 MED ORDER — PHENYLEPHRINE HCL 10 MG/ML IJ SOLN
30.0000 ug/min | INTRAVENOUS | Status: DC
  Administered 2011-11-22: 50 ug/min via INTRAVENOUS
  Administered 2011-11-22: 30 ug/min via INTRAVENOUS
  Filled 2011-11-22 (×3): qty 1

## 2011-11-22 MED ORDER — FAMOTIDINE IN NACL 20-0.9 MG/50ML-% IV SOLN
20.0000 mg | Freq: Two times a day (BID) | INTRAVENOUS | Status: DC
  Administered 2011-11-22 – 2011-11-24 (×5): 20 mg via INTRAVENOUS
  Filled 2011-11-22 (×6): qty 50

## 2011-11-22 MED ORDER — SODIUM CHLORIDE 0.9 % IV SOLN
2.0000 mg/h | INTRAVENOUS | Status: DC
  Administered 2011-11-22: 2 mg/h via INTRAVENOUS
  Administered 2011-11-22: 3 mg/h via INTRAVENOUS
  Administered 2011-11-24 (×2): 2 mg/h via INTRAVENOUS
  Filled 2011-11-22 (×4): qty 10

## 2011-11-22 MED ORDER — PHENYLEPHRINE HCL 10 MG/ML IJ SOLN
30.0000 ug/min | INTRAVENOUS | Status: DC
  Filled 2011-11-22: qty 1

## 2011-11-22 MED ORDER — MAGNESIUM SULFATE 40 MG/ML IJ SOLN
2.0000 g | Freq: Once | INTRAMUSCULAR | Status: AC
  Administered 2011-11-22: 2 g via INTRAVENOUS
  Filled 2011-11-22: qty 50

## 2011-11-22 MED ORDER — SODIUM CHLORIDE 0.9 % IV BOLUS (SEPSIS): 25.0000 mL/kg | Freq: Once | INTRAVENOUS | Status: DC

## 2011-11-22 MED ORDER — ROCURONIUM BROMIDE 50 MG/5ML IV SOLN
INTRAVENOUS | Status: AC
  Filled 2011-11-22: qty 2

## 2011-11-22 MED ORDER — MIDAZOLAM BOLUS VIA INFUSION
1.0000 mg | INTRAVENOUS | Status: DC | PRN
  Administered 2011-11-24: 1 mg via INTRAVENOUS
  Filled 2011-11-22: qty 2

## 2011-11-22 MED ORDER — FLUCONAZOLE IN SODIUM CHLORIDE 200-0.9 MG/100ML-% IV SOLN
200.0000 mg | INTRAVENOUS | Status: DC
  Administered 2011-11-23 – 2011-11-26 (×4): 200 mg via INTRAVENOUS
  Filled 2011-11-22 (×4): qty 100

## 2011-11-22 MED ORDER — SODIUM CHLORIDE 0.9 % IV BOLUS (SEPSIS): 1000.0000 mL | Freq: Once | INTRAVENOUS | Status: DC

## 2011-11-22 MED ORDER — NOREPINEPHRINE BITARTRATE 1 MG/ML IJ SOLN
2.0000 ug/min | INTRAVENOUS | Status: DC
  Administered 2011-11-22: 10 ug/min via INTRAVENOUS
  Administered 2011-11-22: 16 ug/min via INTRAVENOUS
  Administered 2011-11-22: 20 ug/min via INTRAVENOUS
  Administered 2011-11-22: 16 ug/min via INTRAVENOUS
  Administered 2011-11-22: 20 ug/min via INTRAVENOUS
  Administered 2011-11-22: 11:00:00 via INTRAVENOUS
  Administered 2011-11-22: 20 ug/min via INTRAVENOUS
  Administered 2011-11-23: 10 ug/min via INTRAVENOUS
  Filled 2011-11-22 (×8): qty 4

## 2011-11-22 MED ORDER — VASOPRESSIN 20 UNIT/ML IJ SOLN
0.0300 [IU]/min | INTRAMUSCULAR | Status: DC
  Administered 2011-11-22 – 2011-11-24 (×3): 0.03 [IU]/min via INTRAVENOUS
  Filled 2011-11-22 (×3): qty 2.5

## 2011-11-22 MED ORDER — SODIUM BICARBONATE 8.4 % IV SOLN
INTRAVENOUS | Status: DC
  Administered 2011-11-22: 20:00:00 via INTRAVENOUS
  Filled 2011-11-22 (×2): qty 150

## 2011-11-22 MED ORDER — LACTATED RINGERS IV SOLN
INTRAVENOUS | Status: DC | PRN
  Administered 2011-11-22: 10:00:00 via INTRAVENOUS

## 2011-11-22 SURGICAL SUPPLY — 63 items
APPLICATOR COTTON TIP 6IN STRL (MISCELLANEOUS) ×2 IMPLANT
BANDAGE GAUZE ELAST BULKY 4 IN (GAUZE/BANDAGES/DRESSINGS) ×2 IMPLANT
BLADE EXTENDED COATED 6.5IN (ELECTRODE) ×1 IMPLANT
BLADE HEX COATED 2.75 (ELECTRODE) ×2 IMPLANT
BLADE SURG SZ10 CARB STEEL (BLADE) ×2 IMPLANT
CANISTER SUCTION 2500CC (MISCELLANEOUS) ×2 IMPLANT
CLAMP POUCH DRAINAGE QUIET (OSTOMY) ×1 IMPLANT
CLIP TI LARGE 6 (CLIP) IMPLANT
CLOTH BEACON ORANGE TIMEOUT ST (SAFETY) ×2 IMPLANT
COVER MAYO STAND STRL (DRAPES) ×2 IMPLANT
DRAPE LAPAROSCOPIC ABDOMINAL (DRAPES) ×2 IMPLANT
DRAPE LG THREE QUARTER DISP (DRAPES) IMPLANT
DRAPE WARM FLUID 44X44 (DRAPE) ×2 IMPLANT
DRSG PAD ABDOMINAL 8X10 ST (GAUZE/BANDAGES/DRESSINGS) ×1 IMPLANT
ELECT REM PT RETURN 9FT ADLT (ELECTROSURGICAL) ×2
ELECTRODE REM PT RTRN 9FT ADLT (ELECTROSURGICAL) ×1 IMPLANT
EVACUATOR DRAINAGE 10X20 100CC (DRAIN) IMPLANT
EVACUATOR SILICONE 100CC (DRAIN)
GLOVE BIOGEL PI IND STRL 7.0 (GLOVE) ×1 IMPLANT
GLOVE BIOGEL PI INDICATOR 7.0 (GLOVE) ×1
GLOVE SURG ORTHO 8.0 STRL STRW (GLOVE) ×5 IMPLANT
GOWN STRL NON-REIN LRG LVL3 (GOWN DISPOSABLE) ×2 IMPLANT
GOWN STRL REIN XL XLG (GOWN DISPOSABLE) ×4 IMPLANT
HAND ACTIVATED (MISCELLANEOUS) IMPLANT
KIT BASIN OR (CUSTOM PROCEDURE TRAY) ×2 IMPLANT
LEGGING LITHOTOMY PAIR STRL (DRAPES) ×1 IMPLANT
LIGASURE IMPACT 36 18CM CVD LR (INSTRUMENTS) ×1 IMPLANT
NS IRRIG 1000ML POUR BTL (IV SOLUTION) ×4 IMPLANT
PACK GENERAL/GYN (CUSTOM PROCEDURE TRAY) ×2 IMPLANT
POUCH OSTOMY 1 PC DRNBL  2 1/2 (OSTOMY) IMPLANT
POUCH OSTOMY 2 1/2 (OSTOMY) ×2
SCALPEL HARMONIC ACE (MISCELLANEOUS) IMPLANT
SEALER TISSUE X1 CVD JAW (INSTRUMENTS) IMPLANT
SPONGE GAUZE 4X4 12PLY (GAUZE/BANDAGES/DRESSINGS) ×2 IMPLANT
SPONGE LAP 18X18 X RAY DECT (DISPOSABLE) ×1 IMPLANT
STAPLER CUT CVD 40MM BLUE (STAPLE) ×1 IMPLANT
STAPLER PROXIMATE 75MM BLUE (STAPLE) ×1 IMPLANT
STAPLER VISISTAT 35W (STAPLE) ×2 IMPLANT
SUCTION POOLE TIP (SUCTIONS) ×2 IMPLANT
SUT ETHILON 3 0 PS 1 (SUTURE) IMPLANT
SUT NOV 1 T60/GS (SUTURE) IMPLANT
SUT NOVA NAB DX-16 0-1 5-0 T12 (SUTURE) ×6 IMPLANT
SUT NOVA T20/GS 25 (SUTURE) ×2 IMPLANT
SUT PDS AB 1 CTX 36 (SUTURE) ×4 IMPLANT
SUT PDS AB 1 TP1 54 (SUTURE) IMPLANT
SUT PROLENE 2 0 KS (SUTURE) IMPLANT
SUT SILK 2 0 (SUTURE) ×2
SUT SILK 2 0 SH CR/8 (SUTURE) ×2 IMPLANT
SUT SILK 2 0SH CR/8 30 (SUTURE) IMPLANT
SUT SILK 2-0 18XBRD TIE 12 (SUTURE) ×1 IMPLANT
SUT SILK 2-0 30XBRD TIE 12 (SUTURE) IMPLANT
SUT SILK 3 0 (SUTURE) ×4
SUT SILK 3 0 SH CR/8 (SUTURE) ×2 IMPLANT
SUT SILK 3-0 18XBRD TIE 12 (SUTURE) ×2 IMPLANT
SUT VIC AB 3-0 SH 18 (SUTURE) ×1 IMPLANT
SUT VIC AB 4-0 SH 18 (SUTURE) IMPLANT
SUT VICRYL 2 0 18  UND BR (SUTURE)
SUT VICRYL 2 0 18 UND BR (SUTURE) IMPLANT
TAPE CLOTH SURG 4X10 WHT LF (GAUZE/BANDAGES/DRESSINGS) ×1 IMPLANT
TOWEL OR 17X26 10 PK STRL BLUE (TOWEL DISPOSABLE) ×4 IMPLANT
TRAY FOLEY CATH 14FRSI W/METER (CATHETERS) ×1 IMPLANT
WATER STERILE IRR 1500ML POUR (IV SOLUTION) ×2 IMPLANT
YANKAUER SUCT BULB TIP NO VENT (SUCTIONS) ×2 IMPLANT

## 2011-11-22 NOTE — Anesthesia Preprocedure Evaluation (Addendum)
Anesthesia Evaluation  Patient identified by MRN, date of birth, ID band  Reviewed: Allergy & Precautions, H&P , NPO status , Unable to perform ROS - Chart review only  History of Anesthesia Complications (+) PONV  Airway      Comment: Endotracheal tube in place Dental   Pulmonary  Patient on ventilator Fio2 40%, rate18, TV 350, PEEP 5         Cardiovascular hypertension, Rhythm:Regular Rate:Tachycardia  Septic shock; triple pressors.   Neuro/Psych TIAnegative psych ROS   GI/Hepatic Neg liver ROS, GERD-  ,Perforated diverticulitis   Endo/Other  negative endocrine ROS  Renal/GU negative Renal ROS  negative genitourinary   Musculoskeletal negative musculoskeletal ROS (+)   Abdominal   Peds  Hematology Hx Lymphoma   Anesthesia Other Findings   Reproductive/Obstetrics negative OB ROS                          Anesthesia Physical Anesthesia Plan  ASA: IV and Emergent  Anesthesia Plan: General   Post-op Pain Management:    Induction: Intravenous  Airway Management Planned: Oral ETT  Additional Equipment:   Intra-op Plan:   Post-operative Plan: Post-operative intubation/ventilation  Informed Consent: I have reviewed the patients History and Physical, chart, labs and discussed the procedure including the risks, benefits and alternatives for the proposed anesthesia with the patient or authorized representative who has indicated his/her understanding and acceptance.   Dental advisory given  Plan Discussed with: CRNA  Anesthesia Plan Comments:         Anesthesia Quick Evaluation

## 2011-11-22 NOTE — Progress Notes (Signed)
BP 100 systolic now on triple pressors Afebrile on Flagyl, Cipro, Diflucan  No mature culture results yet WBC up this AM from 0.4 to 2,000  Diff pending she did get Neulasta on 8/24 following recent CVP/Rituxan  Chemo given on 8/23 She is alert and responsive Extremities cool but acyanotic Heart: regular Abdomen - soft, not rigid  K 3.0 replacement in progress WBC 2,000 - improved; Hb 9.7, platelets 254,000  Impression: Neutropenic sepsis with acute bowel perforation in lady on chemotherapy for low grade lymphoma  Plan: Dr Gerrit Friends, surgery at bedside - will decide on optimal time for surgical exploration She remains critically ill  Time spent 40 minutes

## 2011-11-22 NOTE — Progress Notes (Signed)
eLink Physician-Brief Progress Note Patient Name: [name] DOB: 1931/06/14 MRN: [id number]  Date of Service  11/22/2011   HPI/Events of Note   Worsening septic shock Respiratory failure  eICU Interventions  Fellow to intubate Sepsis protocol ordered Notifying [facility] of change      Oshay Stranahan 11/22/2011, 2:31 AM

## 2011-11-22 NOTE — Progress Notes (Signed)
Pt intubated by md, intial vent settings as per md.

## 2011-11-22 NOTE — Brief Op Note (Signed)
11/21/2011 - 11/22/2011  11:30 AM  PATIENT:  [name]  78 y.o. female  PRE-OPERATIVE DIAGNOSIS: perforated sigmoid diverticular disease with diffuse peritonitis and pelvic abscess  POST-OPERATIVE DIAGNOSIS:  Same  PROCEDURE:  Procedure(s) (LRB): EXPLORATORY LAPAROTOMY (N/A) COLON RESECTION SIGMOID (N/A) COLOSTOMY (N/A)  SURGEON:  Surgeon(s) and Role:    * Velora Heckler, MD - Primary  ASSISTANTS: Estelle Grumbles, MD   ANESTHESIA:   general  EBL:  Total I/O In: 1307.2 [I.V.:357.2; IV Piggyback:950] Out: 450 [Urine:400; Blood:50]  BLOOD ADMINISTERED:none  DRAINS: none   LOCAL MEDICATIONS USED:  NONE  SPECIMEN:  Excision  DISPOSITION OF SPECIMEN:  PATHOLOGY  COUNTS:  YES  TOURNIQUET:  * No tourniquets in log *  DICTATION: .Other Dictation: Dictation Number [id number]  PLAN OF CARE: Admit to inpatient   PATIENT DISPOSITION:  ICU - intubated and critically ill.   Delay start of Pharmacological VTE agent (>24hrs) due to surgical blood loss or risk of bleeding: yes  Velora Heckler, MD, [facility] Office: [phone]

## 2011-11-22 NOTE — Consult Note (Signed)
Name: [name] MRN: [id number] DOB: [birth year]    LOS: 1  Referring Provider:  Dr. Gerrit Jefferson  Reason for Referral:  Septic shock  PULMONARY / CRITICAL CARE MEDICINE  HPI:  [name] is a 78 year old woman with past medical history significant for low-grade B-cell lymphoma for which she is currently undergoing chemotherapy who presented to the [facility] long emergency room with 4-5 days of abdominal pain.  A CT scan was done and showed free air in the abdomen consistent with perforated bowel.  She was admitted to [name] on the surgery service.  CCM was consulted for hypotension and developing septic shock.  Of note, [name] last received chemotherapy a week ago and her white count is currently 0.4.  Past Medical History  Diagnosis Date  . Cancer 07/08/11    follicular B cell lymphoma  . TIA (transient ischemic attack)   . Arthritis   . Hypertension   . Esophageal abnormality     strictures  . Arthritis 09/04/2011  . Dehydration 11/20/2011  . Diarrhea 11/20/2011  . Complication of anesthesia   . PONV (postoperative nausea and vomiting)     in the 1980's  . GERD (gastroesophageal reflux disease)   . Esophageal stricture     x 2   Past Surgical History  Procedure Date  . Appendectomy   . Parotidectomy w/ neck dissection total   . Tonsillectomy   . Abdominal hysterectomy     left ovaries   Prior to Admission medications   Medication Sig Start Date End Date Taking? Authorizing Provider  acetaminophen (TYLENOL) 500 MG tablet Take 500 mg by mouth every 6 (six) hours as needed. For pain   Yes Historical Provider, MD  allopurinol (ZYLOPRIM) 100 MG tablet Take 100 mg by mouth 2 (two) times daily. 09/04/11 09/03/12 Yes Rebecca December, MD  amLODipine-atorvastatin (CADUET) 5-40 MG per tablet Take 1 tablet by mouth daily.   Yes Historical Provider, MD  clopidogrel (PLAVIX) 75 MG tablet Take 75 mg by mouth daily.   Yes Historical Provider, MD  famotidine (PEPCID) 10 MG tablet Take  10 mg by mouth daily.   Yes Historical Provider, MD  lactulose (CHRONULAC) 10 GM/15ML solution Take 10 g by mouth 3 (three) times daily.   Yes Historical Provider, MD  lidocaine-prilocaine (EMLA) cream Apply 1 application topically as needed. Pain 08/10/11 08/09/12 Yes Rebecca December, MD  loratadine (CLARITIN) 10 MG tablet Take 10 mg by mouth as needed. Allergies   Yes Historical Provider, MD  metoprolol (LOPRESSOR) 100 MG tablet Take 50 mg by mouth 2 (two) times daily.    Yes Historical Provider, MD  predniSONE (DELTASONE) 20 MG tablet Take 160 mg by mouth daily. Take daily for 5 days 09/26/11  Yes Rebecca December, MD  pregabalin (LYRICA) 25 MG capsule Take 25 mg by mouth at bedtime.   Yes Historical Provider, MD  prochlorperazine (COMPAZINE) 10 MG tablet Take 10 mg by mouth every 6 (six) hours as needed. Nausea 08/10/11 08/09/12 Yes Rebecca December, MD  valsartan (DIOVAN) 160 MG tablet Take 160 mg by mouth daily.   Yes Historical Provider, MD   Allergies Allergies  Allergen Reactions  . Codeine Hives  . Other Nausea And Vomiting    anesthesia  . Penicillins Hives  . Sulfa Antibiotics Hives    Family History Family History  Problem Relation Age of Onset  . Cancer Mother 42    gastric  . Hypertension Father     heart  disease  . Cancer Maternal Aunt 10    breast cancer  . Cancer Maternal Uncle 19    colon   Social History  reports that she has never smoked. She has never used smokeless tobacco. She reports that she does not drink alcohol or use illicit drugs. Is a retired Orthoptist  Review Of Systems:  Review of systems could not be obtained due to patient's critical illness  Brief patient description:  78 year-old with perforated bowel and severe sepsis  Events Since Admission: Intubation, central line placement, initiation of pressors  Current Status: Critical  Vital Signs: Temp:  [98.3 F (36.8 C)-99.3 F (37.4 C)] 99.3 F (37.4 C) (08/31 0040) Pulse Rate:  [65-147]  147  (08/31 0302) Resp:  [13-33] 18  (08/31 0302) BP: (63-159)/(26-73) 159/73 mmHg (08/31 0302) SpO2:  [91 %-99 %] 99 % (08/31 0302) FiO2 (%):  [40 %] 40 % (08/31 0302) Weight:  [52.9 kg (116 lb 10 oz)] 52.9 kg (116 lb 10 oz) (08/31 0040)  Physical Examination: General:  Elderly woman, laying in bed, pale, but in no respiratory distress Neuro:  Able to answer questions appropriately, alert and oriented x3 HEENT:  Pupils equally round and reactive to light, extraocular muscles intact Neck:  Supple Cardiovascular:  Tachycardic, regular rhythm, no murmurs, rubs, or gallops Lungs:  Clear to auscultation bilaterally Abdomen:  Tender to palpation, absent bowel sounds Musculoskeletal:  No joint abnormalities Skin:  No rashes.    Principal Problem:  *Septic shock Active Problems:  Diverticulitis of colon with perforation   ASSESSMENT AND PLAN  PULMONARY  Lab 11/22/11 0400  PHART 7.326*  PCO2ART 32.8*  PO2ART 79.7*  HCO3 16.6*  O2SAT 94.1   Ventilator Settings: Vent Mode:  [-] PRVC FiO2 (%):  [40 %] 40 % Set Rate:  [18 bmp] 18 bmp Vt Set:  [350 mL] 350 mL PEEP:  [5 cmH20] 5 cmH20 Plateau Pressure:  [16 cmH20] 16 cmH20 CXR:  No infiltrate.  ET tube in good position.  Left subclavian central line in good position ETT:  7.0 23cm at the lip  A:  Chest x-ray clear in admission.  However, patient is at high risk of developing ARDS associated with her severe sepsis P:   - Intubated for hypoxic respiratory failure - Wean ventilator settings as able   CARDIOVASCULAR No results found for this basename: TROPONINI:5,LATICACIDVEN:5, O2SATVEN:5,PROBNP:5 in the last 168 hours ECG:  Sinus tachycardia Lines: Right port, left subclavian triple lumen central venous catheter  A: Septic shock P:  - Currently on phenylephrine, norepinephrine, and vasopressin - Titrate pressors to maintain map greater than 65 - Bolus normal saline to maintain CVP greater than 12 - Treatment per sepsis  protocol  RENAL  Lab 11/21/11 1909 11/20/11 1055  NA 135 134*  K 2.5* 2.9*  CL 98 96*  CO2 26 26  BUN 10 11.0  CREATININE 0.51 0.8  CALCIUM 8.3* --  MG -- --  PHOS -- --   Intake/Output      08/30 0701 - 08/31 0700   IV Piggyback 100   Total Intake(mL/kg) 100 (1.9)   Net +100        Foley:  Placed 11/21/2011  A:  Hypokalemia P:   - Monitor and replete electrolytes as needed - Monitor urine output  GASTROINTESTINAL  Lab 11/21/11 1909 11/20/11 1055  AST 13 13  ALT 12 14  ALKPHOS 63 97  BILITOT 0.6 0.90  PROT 5.1* 5.4*  ALBUMIN 2.5* 2.8*  A:  Perforated bowel P:   - Place OG tube to low intermittent wall suction - Management per surgery service - See ID for antibiotics  HEMATOLOGIC  Lab 11/21/11 1909 11/20/11 1055  HGB 10.1* 11.6  HCT 29.1* 33.6*  PLT 223 180  INR -- --  APTT -- --   A:  Follicular B cell lymphoma, currently undergoing chemotherapy P:  -  Patient neutropenic due to chemotherapy and sepsis - Consider hematology oncology consult - Consider Neupogen or Neulasta to boost immune system in setting of critical illness  INFECTIOUS  Lab 11/21/11 1909 11/20/11 1055  WBC 0.4* 1.9*  PROCALCITON -- --   Cultures: Blood and urine cultures pending Antibiotics: - Given Cipro and Flagyl and fluconazole   A:  Perforated bowel P:   - Continue to cover broadly for enteric pathogens, including fungal infection  ENDOCRINE No results found for this basename: GLUCAP:5 in the last 168 hours A:  Risk for hyperglycemia or hypoglycemia and adrenal insufficiency   P:   - Cortisol level pending, start stress dose steroids if low, consider empiric stress dose steroids for refractory shock. - Follow blood glucose on chemistries  NEUROLOGIC  A:  Maintain adequate sedation while intubated P:   - Started on fentanyl drip and bolus Versed  BEST PRACTICE / DISPOSITION Level of Care:  ICU Primary Service:  Surgery Consultants: PCCM Code Status:   Full Diet:  N.p.o. DVT Px:  Heparin GI Px:  Pepcid Skin Integrity:  Good Social / Family:  Family at bedside  I spent 90 minutes of direct critical care time separate from procedures, which are documented elsewhere.  Carolan Clines., M.D. Pulmonary and Critical Care Medicine [facility] Pager: [phone]  11/22/2011, 4:34 AM

## 2011-11-22 NOTE — Progress Notes (Signed)
Hypocalcemia and lactic acidosis   Calcium replaced; bicarb drip initiated.

## 2011-11-22 NOTE — Progress Notes (Signed)
Hypomagnesemia   Mg replaced  

## 2011-11-22 NOTE — Procedures (Signed)
Arterial Catheter Insertion Procedure Note [name] [id number] [birth year]  Procedure: Insertion of Arterial Catheter  Indications: Blood pressure monitoring  Procedure Details Consent: Unable to obtain consent because of emergent medical necessity. Time Out: Verified patient identification, verified procedure, site/side was marked, verified correct patient position, special equipment/implants available, medications/allergies/relevent history reviewed, required imaging and test results available.  Performed  Maximum sterile technique was used including antiseptics, gloves, gown, hand hygiene and sheet. Skin prep: Chlorhexidine; local anesthetic administered 20 gauge catheter was inserted into right radial artery using the Seldinger technique.  Evaluation Blood flow good; BP tracing good. Complications: No apparent complications.   Nancy Nordmann T 11/22/2011

## 2011-11-22 NOTE — Progress Notes (Signed)
eLink Physician-Brief Progress Note Patient Name: [name] DOB: [birth year] MRN: [id number]  Date of Service  11/22/2011   HPI/Events of Note   Septic shock Peritonitis CCS consulted [place] for management  eICU Interventions  Start phenylephrine now Continue fluid bolus/resuscitatin Fellow on way to see patient now   Intervention Category Major Interventions: Hypotension - evaluation and management  MCQUAID, DOUGLAS 11/22/2011, 1:29 AM

## 2011-11-22 NOTE — Progress Notes (Signed)
Pt taken to OR on being bagged per RT, IVF's infusing as documented.  Pt on monitor.  Family at bedside and aware of prognosis.

## 2011-11-22 NOTE — Procedures (Signed)
Central Venous Catheter Insertion Procedure Note DEVITA NIES [id number] January 01, 1932  Procedure: Insertion of Central Venous Catheter Indications: Assessment of intravascular volume, Drug and/or fluid administration and Frequent blood sampling  Procedure Details Consent: Risks of procedure as well as the alternatives and risks of each were explained to the (patient/caregiver).  Consent for procedure obtained. Time Out: Verified patient identification, verified procedure, site/side was marked, verified correct patient position, special equipment/implants available, medications/allergies/relevent history reviewed, required imaging and test results available.  Performed  Maximum sterile technique was used including antiseptics, cap, gloves, gown, hand hygiene, mask and sheet. Skin prep: Chlorhexidine; local anesthetic administered A antimicrobial bonded/coated triple lumen catheter was placed in the left subclavian vein using the Seldinger technique.  Evaluation Blood flow good Complications: No apparent complications Patient did tolerate procedure well. Chest X-ray ordered to verify placement.  CXR: normal.  GIDDINGS, OLIVIA K. 11/22/2011, 4:31 AM

## 2011-11-22 NOTE — Procedures (Signed)
Intubation Procedure Note SANA TESSMER [id number] Apr 28, 1931  Procedure: Intubation Indications: Respiratory insufficiency  Procedure Details Consent: Risks of procedure as well as the alternatives and risks of each were explained to the (patient/caregiver).  Consent for procedure obtained. Time Out: Verified patient identification, verified procedure, site/side was marked, verified correct patient position, special equipment/implants available, medications/allergies/relevent history reviewed, required imaging and test results available.  Performed  Maximum sterile technique was used including gloves and mask.  MAC and 3    Evaluation Hemodynamic Status: Transient hypertension requiring treatment; O2 sats: stable throughout Patient's Current Condition: stable Complications: No apparent complications Patient did tolerate procedure well. Chest X-ray ordered to verify placement.  CXR: tube position acceptable.   Harpreet Pompey K. 11/22/2011

## 2011-11-22 NOTE — Consult Note (Signed)
Name: KELLYJO EDGREN MRN: [id number] DOB: 02-13-1932    LOS: 1  Referring Provider:  Dr. Gerrit Friends  Reason for Referral:  Septic shock  PULMONARY / CRITICAL CARE MEDICINE  HPI:  Ms. Nardo is a 78 year old woman with past medical history significant for low-grade B-cell lymphoma for which she is currently undergoing chemotherapy who presented to the [facility] long emergency room with 4-5 days of abdominal pain.  A CT scan was done and showed free air in the abdomen consistent with perforated bowel.  She was admitted to Dr. Gerrit Friends on the surgery service.  CCM was consulted for hypotension and developing septic shock.  Of note, Ms. Hevia last received chemotherapy a week ago and her white count is currently 0.4.  Brief patient description:  78 year-old with B-cell lymphoma on chemo, perforated bowel and severe sepsis  Events Since Admission: Intubation, central line placement, initiation of pressors  Current Status: Critical  Vital Signs: Temp:  [97.6 F (36.4 C)-99.3 F (37.4 C)] 97.6 F (36.4 C) (08/31 0400) Pulse Rate:  [65-153] 123  (08/31 0635) Resp:  [0-33] 21  (08/31 0635) BP: (63-159)/(26-85) 102/61 mmHg (08/31 0600) SpO2:  [84 %-100 %] 98 % (08/31 0635) Arterial Line BP: (103-155)/(50-70) 155/66 mmHg (08/31 0635) FiO2 (%):  [40 %] 40 % (08/31 0816) Weight:  [52.9 kg (116 lb 10 oz)] 52.9 kg (116 lb 10 oz) (08/31 0040)  Intake/Output Summary (Last 24 hours) at 11/22/11 0844 Last data filed at 11/22/11 0600  Gross per 24 hour  Intake   2975 ml  Output    475 ml  Net   2500 ml   Physical Examination: General:  Elderly woman, laying in bed, pale, but in no respiratory distress Neuro:  Able to answer questions appropriately, alert and oriented x3 HEENT:  Pupils equally round and reactive to light, extraocular muscles intact Neck:  Supple Cardiovascular:  Tachycardic, regular rhythm, no murmurs, rubs, or gallops Lungs:  Clear to auscultation bilaterally Abdomen:  Tender  to palpation, absent bowel sounds Musculoskeletal:  No joint abnormalities Skin:  No rashes.    Principal Problem:  *Septic shock Active Problems:  Diverticulitis of colon with perforation   ASSESSMENT AND PLAN  PULMONARY  Lab 11/22/11 0438 11/22/11 0400  PHART -- 7.326*  PCO2ART -- 32.8*  PO2ART -- 79.7*  HCO3 -- 16.6*  O2SAT 75.7 94.1   Ventilator Settings: Vent Mode:  [-] PRVC FiO2 (%):  [40 %] 40 % Set Rate:  [18 bmp] 18 bmp Vt Set:  [350 mL] 350 mL PEEP:  [5 cmH20] 5 cmH20 Plateau Pressure:  [14 cmH20-16 cmH20] 14 cmH20 CXR:  No infiltrate.  ET tube in good position.  Left subclavian central line in good position ETT:  8/31 >>   A:  Evolving B interstitial infiltrates, high risk ARDS (CVP only 11) P:   - low VT per ARDS protocol - not in a position right now to drive CVP down per FACTT, will attempt to do so when/if pressors weaned   CARDIOVASCULAR  Lab 11/22/11 0330  TROPONINI <0.30  LATICACIDVEN 4.7*  PROBNP --   ECG:  Sinus tachycardia Lines: Right port, left subclavian triple lumen central venous catheter  A: Septic shock, GI source P:  - EGDT, pressors as ordered - Bolus normal saline to maintain CVP greater than 12 - stress dose steroids added 8/31  RENAL  Lab 11/22/11 0330 11/21/11 1909 11/20/11 1055  NA 132* 135 134*  K 3.0* 2.5* --  CL 101 98  96*  CO2 19 26 26   BUN 9 10 11.0  CREATININE 0.69 0.51 0.8  CALCIUM 6.8* 8.3* --  MG -- -- --  PHOS -- -- --   Intake/Output      08/30 0701 - 08/31 0700 08/31 0701 - 09/01 0700   I.V. (mL/kg) 875 (16.5)    IV Piggyback 2100    Total Intake(mL/kg) 2975 (56.2)    Emesis/NG output 475    Total Output 475    Net +2500          Foley:  Placed 11/21/2011  A:  Hypokalemia P:   - Monitor and replete electrolytes as needed, repeat BMP 1600 - Monitor urine output  GASTROINTESTINAL  Lab 11/21/11 1909 11/20/11 1055  AST 13 13  ALT 12 14  ALKPHOS 63 97  BILITOT 0.6 0.90  PROT 5.1* 5.4*    ALBUMIN 2.5* 2.8*    A:  Perforated bowel P:   - OG tube to low intermittent wall suction - Management per surgery service >> to OR this am - See ID for antibiotics  HEMATOLOGIC  Lab 11/22/11 0330 11/21/11 1909 11/20/11 1055  HGB 9.7* 10.1* 11.6  HCT 29.6* 29.1* 33.6*  PLT 254 223 180  INR 1.18 -- --  APTT 33 -- --   A:  Follicular B cell lymphoma, currently undergoing chemotherapy P:  -  Patient neutropenic due to chemotherapy and sepsis - Consider hematology oncology consult - Neulasta was given last week per the patient's son  INFECTIOUS  Lab 11/22/11 0330 11/21/11 1909 11/20/11 1055  WBC 2.0* 0.4* 1.9*  PROCALCITON 4.18 -- --   Cultures: Blood 8/31 >> Urine 8/31 >>   Antibiotics: - Given Cipro and Flagyl and fluconazole day 1  A:  Perforated bowel P:   - Continue to cover broadly for enteric pathogens, including fungal infection - to OR this am; have discussed with Dr Gerrit Friends, Dr Rica Mast and pt's family. I believe this is our window of opportunity to give her best chance to survive a very risky surgery  ENDOCRINE No results found for this basename: GLUCAP:5 in the last 168 hours A:  Risk for hyperglycemia or hypoglycemia and adrenal insufficiency   P:   - Stress dose steroids started 8/31, Cortisol level pending - Follow blood glucose on chemistries  NEUROLOGIC  A:  Maintain adequate sedation while intubated P:   - Started on fentanyl drip and bolus Versed  BEST PRACTICE / DISPOSITION Level of Care:  ICU Primary Service:  Surgery Consultants: PCCM Code Status:  Full Diet:  N.p.o. DVT Px:  Heparin GI Px:  Pepcid Skin Integrity:  Good Social / Family:  Family at bedside  CC time 60 minutes  Levy Pupa, MD, PhD 11/22/2011, 8:58 AM [place] [facility] [phone] or if no answer [phone]

## 2011-11-22 NOTE — Progress Notes (Signed)
eLink Physician-Brief Progress Note Patient Name: SHANICQUA COLDREN DOB: 1931/06/12 MRN: [id number]  Date of Service  11/22/2011   HPI/Events of Note     eICU Interventions  Hypokalemia, repleted    Intervention Category Major Interventions: Electrolyte abnormality - evaluation and management  Jahnai Slingerland 11/22/2011, 6:10 AM

## 2011-11-22 NOTE — Progress Notes (Signed)
eLink Physician-Brief Progress Note Patient Name: [name] DOB: 02/26/1932 MRN: [id number]  Date of Service  11/22/2011   HPI/Events of Note   Septic shock, peritonitis  eICU Interventions  Add diflucan      [name] 11/22/2011, 2:35 AM

## 2011-11-22 NOTE — Procedures (Signed)
Intubation Procedure Note [name] [id number] [birth year]  Procedure: Intubation Indications: Airway protection and maintenance  Procedure Details Consent: Unable to obtain consent because of emergent medical necessity. Time Out: Verified patient identification, verified procedure, site/side was marked, verified correct patient position, special equipment/implants available, medications/allergies/relevent history reviewed, required imaging and test results available.  Performed  Maximum sterile technique was used including gloves and hand hygiene.  Miller and 3    Evaluation Hemodynamic Status: BP stable throughout; O2 sats: stable throughout Patient's Current Condition: unstable Complications: No apparent complications Patient did tolerate procedure well. Chest X-ray ordered to verify placement.  CXR: pending.   Nancy Nordmann T 11/22/2011

## 2011-11-22 NOTE — Transfer of Care (Signed)
Immediate Anesthesia Transfer of Care Note  Patient: [name]  Procedure(s) Performed: Procedure(s) (LRB): EXPLORATORY LAPAROTOMY (N/A) COLON RESECTION SIGMOID (N/A) COLOSTOMY (N/A)  Patient Location: ICU  Anesthesia Type: General  Level of Consciousness: Patient remains intubated per anesthesia plan  Airway & Oxygen Therapy: Patient placed on Ventilator (see vital sign flow sheet for setting)  Post-op Assessment: Post -op Vital signs reviewed and stable and report given to Lorrin Jackson RN.  Post vital signs: Reviewed and stable  Complications: No apparent anesthesia complications

## 2011-11-22 NOTE — Progress Notes (Signed)
PCCM progress note  Pt returned to ICU s/p exploratory lap, sigmoid resection, colostomy. Remains on pressors. I have spoken to her two sons and her husband. All agree that we will continue aggressive support for now, but that we would not do CPR/ACLS if she were to decline. I will change the orders to reflect this.  Levy Pupa, MD, PhD 11/22/2011, 12:00 PM [place] [facility] [phone] or if no answer [phone]

## 2011-11-22 NOTE — Progress Notes (Signed)
CRITICAL VALUE ALERT  Critical value received:  Calcium 5.9  Date of notification:  11/22/11  Time of notification:  1415  Critical value read back:yes  Nurse who received alert:  Volanda Napoleon RN  MD notified (1st page): Dr. Delton Coombes  Time of first page:  1415 MD notified (2nd page):n/a  Time of second page:n/a  Responding MD:  Dr. Delton Coombes  Time MD responded:  1420

## 2011-11-22 NOTE — Progress Notes (Signed)
Pt intubated with 7.0 tube with a VT 350, peep 5, O2 40%, rate 18. Pt is stable at this time. Will continue to monitor.

## 2011-11-22 NOTE — Progress Notes (Signed)
ANTIBIOTIC CONSULT NOTE - INITIAL  Pharmacy Consult for Fluconazole Indication: Peritonitis, septic shock  Allergies  Allergen Reactions  . Codeine Hives  . Other Nausea And Vomiting    anesthesia  . Penicillins Hives  . Sulfa Antibiotics Hives    Patient Measurements: Height: 4\' 11"  (149.9 cm) Weight: 116 lb 10 oz (52.9 kg) IBW/kg (Calculated) : 43.2    Vital Signs: Temp: 99.3 F (37.4 C) (08/31 0040) Temp src: Oral (08/31 0040) BP: 75/31 mmHg (08/31 0145) Pulse Rate: 117  (08/31 0145) Intake/Output from previous day: 08/30 0701 - 08/31 0700 In: 100 [IV Piggyback:100] Out: -  Intake/Output from this shift: Total I/O In: 100 [IV Piggyback:100] Out: -   Labs:  Basename 11/21/11 1909 11/20/11 1055  WBC 0.4* 1.9*  HGB 10.1* 11.6  PLT 223 180  LABCREA -- --  CREATININE 0.51 0.8   Estimated Creatinine Clearance: 43.8 ml/min (by C-G formula based on Cr of 0.51). No results found for this basename: VANCOTROUGH:2,VANCOPEAK:2,VANCORANDOM:2,GENTTROUGH:2,GENTPEAK:2,GENTRANDOM:2,TOBRATROUGH:2,TOBRAPEAK:2,TOBRARND:2,AMIKACINPEAK:2,AMIKACINTROU:2,AMIKACIN:2, in the last 72 hours   Microbiology: No results found for this or any previous visit (from the past 720 hour(s)).  Medical History: Past Medical History  Diagnosis Date  . Cancer 07/08/11    follicular B cell lymphoma  . TIA (transient ischemic attack)   . Arthritis   . Hypertension   . Esophageal abnormality     strictures  . Arthritis 09/04/2011  . Dehydration 11/20/2011  . Diarrhea 11/20/2011  . Complication of anesthesia   . PONV (postoperative nausea and vomiting)     in the 1980's  . GERD (gastroesophageal reflux disease)   . Esophageal stricture     x 2    Medications:  Scheduled:    . sodium chloride   Intravenous Once  . ciprofloxacin  400 mg Intravenous BID  . etomidate      .  HYDROmorphone (DILAUDID) injection  1 mg Intravenous Once  .  HYDROmorphone (DILAUDID) injection  1 mg Intravenous  Once  . lidocaine (cardiac) 100 mg/22ml      . metronidazole  500 mg Intravenous Q8H  . ondansetron (ZOFRAN) IV  4 mg Intravenous Once  . pantoprazole (PROTONIX) IV  40 mg Intravenous QHS  . potassium chloride  10 mEq Intravenous Q1 Hr x 2  . rocuronium      . sodium chloride  1,000 mL Intravenous Once  . sodium chloride  1,000 mL Intravenous Once  . sodium chloride  25 mL/kg Intravenous Once  . sodium chloride  500 mL Intravenous Once  . succinylcholine      . DISCONTD: meropenem (MERREM) IV  1 g Intravenous Q8H  . DISCONTD: metronidazole  500 mg Intravenous Once  . DISCONTD: potassium chloride  10 mEq Intravenous Q1 Hr x 6   Infusions:    . sodium chloride    . dextrose 5 % and 0.45 % NaCl with KCl 40 mEq/L 125 mL/hr at 11/21/11 2310  . phenylephrine (NEO-SYNEPHRINE) Adult infusion 50 mcg/min (11/22/11 0156)  . vasopressin (PITRESSIN) infusion - *FOR SHOCK*    . DISCONTD: norepinephrine (LEVOPHED) Adult infusion     Assessment:  78 year old female with B-cell lymphoma undergoing chemotherapy presents with worsening abdominal pain  CT showed perforated diverticular disease with abscesses.  IV Cipro and Flagyl were initiated with plans for laparotomy with resection and colostomy on 8/31  Pt transferred to ICU.  Developed respiratory failure and worsening septic shock. Pt to be intubated and septic shock protocol initiated  IV Diflucan to be  added for peritonitis/septic shock  CrCl ~ 43 ml/min  Goal of Therapy:  Eradication of infection  Plan:  Diflucan 400mg  IV x 1 then 200mg  IV q24h Follow renal function and adjust dose as necessary  Makayla Lanter, Joselyn Glassman, PharmD 11/22/2011,2:43 AM

## 2011-11-22 NOTE — Interval H&P Note (Signed)
History and Physical Interval Note:  11/22/2011 9:37 AM  [name]  has presented today for surgery, with the diagnosis of free intraperitoneal air and fluid, perforated diverticular disease with diffuse peritonitis.  The various methods of treatment have been discussed with the patient and family. After consideration of risks, benefits and other options for treatment, the patient and her family have consented to    Procedure(s) (LRB): EXPLORATORY LAPAROTOMY (N/A) COLON RESECTION SIGMOID (N/A) COLOSTOMY (N/A) as a surgical intervention .    The patient's history has been reviewed, patient examined, no change in status, acceptable for surgery.  I have reviewed the patient's chart and labs.  Questions were answered to the patient's and family's satisfaction.  Patient is at significantly elevated risk for surgical intervention.  However, with sepsis and impending organ system failure, there are no other viable options for management.  Will proceed with laparotomy this AM.  Velora Heckler, MD, [facility] Office: [phone]     Letisia Schwalb Judie Petit

## 2011-11-22 NOTE — Progress Notes (Signed)
CRITICAL VALUE ALERT  Critical value received:  Calcium 5.8  Date of notification:  11/22/11  Time of notification: 1825  Critical value read back:yes  Nurse who received alert:  Lorrin Jackson RN MD notified (1st page):  Dr. Frederico Hamman  Time of first page: 1826  MD notified (2nd page):n/a  Time of second page:n/a  Responding MD:  Dr. Frederico Hamman  Time MD responded:  [phone]

## 2011-11-22 NOTE — Anesthesia Postprocedure Evaluation (Signed)
  Anesthesia Post-op Note  Patient: [name]  Procedure(s) Performed: Procedure(s) (LRB): EXPLORATORY LAPAROTOMY (N/A) COLON RESECTION SIGMOID (N/A) COLOSTOMY (N/A)  Patient Location: ICU  Anesthesia Type: General  Level of Consciousness: unresponsive and Patient remains intubated per anesthesia plan  Airway and Oxygen Therapy: Patient remains intubated per anesthesia plan  Post-op Pain: mild  Post-op Assessment: PATIENT'S CARDIOVASCULAR STATUS UNSTABLE  Post-op Vital Signs: unstable  Complications: No apparent anesthesia complications

## 2011-11-22 NOTE — Significant Event (Signed)
General Surgery [facility]  Patient admitted to ICU from ER.  Progressive tachycardia and hypotension since arrival.  Discussed with ICU nurse and with ICU MD Dr. Kendrick Fries.  Consult placed to CCM.  Central line started by [facility] fellow and pressor support begun per sepsis protocol.  Patient presently intubated.  Discussed with CCM fellow at bedside.  Family notified by nursing and will come to hospital.  Will discuss course of treatment with them on arrival - patient's son is a physician.  Patient tentatively scheduled for surgery this AM if she can be sufficiently stabilized.  Velora Heckler, MD, [facility] Office: [phone]

## 2011-11-22 NOTE — Progress Notes (Signed)
Pt arrived in the ICU about 0030 am since that time she rapidly declined requiring multiple interventions.  CCS,MD informed and wanted CCM to consult.  Multiple orders received and f/u. Pt required multiple hands to execute her care. Pt family was called and made aware of her decline and advised to come back to the hospital if they wanted to do such. CCM, MD and CCS, MD at pt bedside and staff to provide pt vigorous care.  Pt a full code.  Pt was intubated and Central Line placed as well per CCM, MD.  Family returned and given an update on pt status thus far.  Pt continues to receive multiple life-saving medications and interventions. Will continue to monitor and report. Conley Rolls, RN

## 2011-11-22 NOTE — Progress Notes (Signed)
Patient ID: [name], female   DOB: [birth year], 78 y.o.   MRN: [id number]  General Surgery - [facility] - Progress Note  HD# 2  Subjective: Patient in ICU.  Intubated.  On pressor support (3).  Awake and responsive to questions.  Objective: Vital signs in last 24 hours: Temp:  [97.6 F (36.4 C)-99.3 F (37.4 C)] 97.6 F (36.4 C) (08/31 0400) Pulse Rate:  [65-153] 114  (08/31 0615) Resp:  [0-33] 14  (08/31 0615) BP: (63-159)/(26-85) 102/61 mmHg (08/31 0600) SpO2:  [84 %-100 %] 98 % (08/31 0615) Arterial Line BP: (103-150)/(50-70) 124/60 mmHg (08/31 0615) FiO2 (%):  [40 %] 40 % (08/31 0302) Weight:  [116 lb 10 oz (52.9 kg)] 116 lb 10 oz (52.9 kg) (08/31 0040)    Intake/Output from previous day: 08/30 0701 - 08/31 0700 In: 100 [IV Piggyback:100] Out: 475 [Emesis/NG output:475]  Exam: HEENT - clear, not icteric Neck - soft Chest - clear bilaterally Cor - tachycardic, no murmur Abd - mild distension; quiet; diffusely tender with guarding and rebound Ext - mild LE edema Neuro - grossly intact, no focal deficits  Lab Results:   Basename 11/22/11 0330 11/21/11 1909  WBC 2.0* 0.4*  HGB 9.7* 10.1*  HCT 29.6* 29.1*  PLT 254 223     Basename 11/22/11 0330 11/21/11 1909  NA 132* 135  K 3.0* 2.5*  CL 101 98  CO2 19 26  GLUCOSE 211* 98  BUN 9 10  CREATININE 0.69 0.51  CALCIUM 6.8* 8.3*    Studies/Results: Ct Abdomen Pelvis Wo Contrast  11/21/2011  *RADIOLOGY REPORT*  Clinical Data:  Black stool, stage IV lymphoma, pain all liver, weakness, diarrhea, nausea, past history of hypertension, liver cyst question diverticulitis  CT ABDOMEN AND PELVIS WITHOUT CONTRAST  Technique:  Multidetector CT imaging of the abdomen and pelvis was performed following the standard protocol without intravenous contrast.  Oral contrast was administered.  Sagittal and coronal MPR images reconstructed from axial data set.  Comparison: 11/11/2011  Findings: Dependent  atelectasis bilateral lung bases. Moderate sized hiatal hernia with retained contrast within both the hiatal hernia and within a distended distal esophagus, question related to gastroesophageal reflux. Within limits of a nonenhanced exam, no focal abnormalities of the liver, spleen, pancreas, kidneys, or adrenal glands.  Free intraperitoneal air compatible with perforated viscus new since prior exam. Numerous diverticula are identified at the descending and sigmoid colon. Extensive inflammatory process identified in the left pelvis including an extraluminal gas and fluid collections in the sigmoid mesocolon compatible with abscess. This measures up to 6.2 x 2.8 cm image 61 and extending 4.2 cm length. Significant wall thickening of the sigmoid colon, rectum, and numerous small bowel loops in the pelvis with associated free pelvic fluid.  Secondary dilatation of proximal small bowel loops. Stranding throughout the mesentery, sigmoid mesocolon and retroperitoneum including a portion of the presacral space. Appendix surgically absent by history with nonvisualization of the uterus as well. Largest residual right inguinal lymph node measures 18 x 9 mm image 75. Diffuse osseous demineralization with degenerative disc disease changes and retrolisthesis at L2-L3.  IMPRESSION: Free intraperitoneal air and fluid with extensive inflammatory process in pelvis. Wall thickening of the sigmoid colon with numerous sigmoid diverticula and presence of a 6.2 x 2.8 x 4.2 cm diameter abscess within the sigmoid mesocolon. This constellation of findings likely represents perforated diverticulitis. Extensive associated inflammatory process of the pelvis with free fluid, wall thickening of the distal sigmoid colon, rectum,  and pelvic small bowel loops, and edema throughout the mesentery and retroperitoneum. Secondary proximal small bowel dilatation.  Critical Value/emergent results were called by telephone at the time of interpretation on  11/21/2011 at 2021 hours to Dr. Jeraldine Loots, who verbally acknowledged these results.   Original Report Authenticated By: Lollie Marrow, M.D.    Dg Chest Port 1 View  11/22/2011  *RADIOLOGY REPORT*  Clinical Data: Evaluate central venous catheter placement  PORTABLE CHEST - 1 VIEW  Comparison: 08/05/2011  Findings: There is a left subclavian catheter with tip in the right atrium.  No pneumothorax identified.  The right chest wall porta- catheter tip is also in the right atrium.  Heart size is normal.  No pleural effusions noted.  Diffuse pulmonary venous congestion is negative.  No airspace consolidation.  IMPRESSION:  The left subclavian catheter tip is in the right atrium.  No pneumothorax noted.   Original Report Authenticated By: Rosealee Albee, M.D.    Dg Chest Port 1v Same Day  11/22/2011  *RADIOLOGY REPORT*  Clinical Data: Status post ET tube placement  PORTABLE CHEST - 1 VIEW SAME DAY  Comparison: Earlier today  Findings: The endotracheal tube tip is above the carina.  Left subclavian catheter tip is in the right atrium.  There is a right chest wall porta-catheter with tip in the right atrium.  Heart size is normal.  There is mild diffuse interstitial edema.  IMPRESSION:  1. Endotracheal tube tip is above the carina.a   Original Report Authenticated By: Rosealee Albee, M.D.     Assessment / Plan: 1.  Perforated diverticular disease with diffuse peritonitis  - on Cipro, Flagyl, and Diflucan  - sepsis requiring pressor support  - will evaluate with anesthesiology this AM - decision regarding proceeding to OR for laparotomy 2.  Hypokalemia  - improved  - will give more runs of KCl 3.  Neutropenia / B-cell lymphoma  - medical oncology evaluating - Dr. Cyndie Chime in this AM  Velora Heckler, MD, [facility] Office: [phone]  11/22/2011

## 2011-11-23 ENCOUNTER — Other Ambulatory Visit: Payer: Self-pay

## 2011-11-23 ENCOUNTER — Inpatient Hospital Stay (HOSPITAL_COMMUNITY): Payer: Medicare Other

## 2011-11-23 DIAGNOSIS — K651 Peritoneal abscess: Secondary | ICD-10-CM

## 2011-11-23 DIAGNOSIS — I4891 Unspecified atrial fibrillation: Secondary | ICD-10-CM

## 2011-11-23 DIAGNOSIS — C8299 Follicular lymphoma, unspecified, extranodal and solid organ sites: Secondary | ICD-10-CM

## 2011-11-23 LAB — BLOOD GAS, ARTERIAL
Acid-base deficit: 5 mmol/L — ABNORMAL HIGH (ref 0.0–2.0)
Bicarbonate: 16.9 mEq/L — ABNORMAL LOW (ref 20.0–24.0)
Drawn by: 244901
Drawn by: 103701
FIO2: 0.3 %
MECHVT: 350 mL
O2 Saturation: 94.9 %
PEEP: 5 cmH2O
PEEP: 5 cmH2O
RATE: 18 resp/min
RATE: 18 resp/min
pCO2 arterial: 29.3 mmHg — ABNORMAL LOW (ref 35.0–45.0)
pH, Arterial: 7.374 (ref 7.350–7.450)
pO2, Arterial: 107 mmHg — ABNORMAL HIGH (ref 80.0–100.0)

## 2011-11-23 LAB — BASIC METABOLIC PANEL
BUN: 6 mg/dL (ref 6–23)
BUN: 6 mg/dL (ref 6–23)
CO2: 18 mEq/L — ABNORMAL LOW (ref 19–32)
CO2: 20 mEq/L (ref 19–32)
Calcium: 6.6 mg/dL — ABNORMAL LOW (ref 8.4–10.5)
Calcium: 6 mg/dL — CL (ref 8.4–10.5)
Calcium: 6.4 mg/dL — CL (ref 8.4–10.5)
Chloride: 94 mEq/L — ABNORMAL LOW (ref 96–112)
Creatinine, Ser: 0.44 mg/dL — ABNORMAL LOW (ref 0.50–1.10)
Creatinine, Ser: 0.43 mg/dL — ABNORMAL LOW (ref 0.50–1.10)
GFR calc Af Amer: >90 mL/min (ref >90)
GFR calc Af Amer: >90 mL/min (ref >90)
GFR calc non Af Amer: >90 mL/min (ref >90)
GFR calc non Af Amer: >90 mL/min (ref >90)
Glucose, Bld: 227 mg/dL — ABNORMAL HIGH (ref 70–99)
Potassium: 4.2 mEq/L (ref 3.5–5.1)
Sodium: 125 mEq/L — ABNORMAL LOW (ref 135–145)

## 2011-11-23 LAB — CBC
Hemoglobin: 8.9 g/dL — ABNORMAL LOW (ref 12.0–15.0)
MCH: 30.2 pg (ref 26.0–34.0)
MCHC: 33.8 g/dL (ref 30.0–36.0)
Platelets: 166 10*3/uL (ref 150–400)
RBC: 2.95 MIL/uL — ABNORMAL LOW (ref 3.87–5.11)

## 2011-11-23 LAB — GLUCOSE, CAPILLARY
Glucose-Capillary: 253 mg/dL — ABNORMAL HIGH (ref 70–99)
Glucose-Capillary: 186 mg/dL — ABNORMAL HIGH (ref 70–99)

## 2011-11-23 LAB — HEPATIC FUNCTION PANEL
Albumin: 1.1 g/dL — ABNORMAL LOW (ref 3.5–5.2)
Total Protein: 3.2 g/dL — ABNORMAL LOW (ref 6.0–8.3)

## 2011-11-23 LAB — TROPONIN I: Troponin I: <0.3 ng/mL (ref <0.30)

## 2011-11-23 LAB — URINE CULTURE: Culture: NO GROWTH

## 2011-11-23 LAB — PROCALCITONIN: Procalcitonin: 2.35 ng/mL

## 2011-11-23 LAB — LACTIC ACID, PLASMA: Lactic Acid, Venous: 5.4 mmol/L — ABNORMAL HIGH (ref 0.5–2.2)

## 2011-11-23 LAB — MAGNESIUM: Magnesium: 1.2 mg/dL — ABNORMAL LOW (ref 1.5–2.5)

## 2011-11-23 MED ORDER — SODIUM CHLORIDE 0.9 % IV BOLUS (SEPSIS)
1000.0000 mL | Freq: Once | INTRAVENOUS | Status: AC
  Administered 2011-11-23: 1000 mL via INTRAVENOUS

## 2011-11-23 MED ORDER — AMIODARONE HCL IN DEXTROSE 360-4.14 MG/200ML-% IV SOLN
1.0000 mg/min | INTRAVENOUS | Status: AC
  Administered 2011-11-23 (×2): 1 mg/min via INTRAVENOUS
  Filled 2011-11-23: qty 200

## 2011-11-23 MED ORDER — PHENYLEPHRINE HCL 10 MG/ML IJ SOLN
30.0000 ug/min | INTRAVENOUS | Status: DC
  Administered 2011-11-23: 50 ug/min via INTRAVENOUS
  Filled 2011-11-23: qty 1

## 2011-11-23 MED ORDER — AMIODARONE HCL IN DEXTROSE 360-4.14 MG/200ML-% IV SOLN
0.5000 mg/min | INTRAVENOUS | Status: DC
  Administered 2011-11-23 – 2011-11-27 (×8): 0.5 mg/min via INTRAVENOUS
  Filled 2011-11-23 (×7): qty 200

## 2011-11-23 MED ORDER — AMIODARONE HCL IN DEXTROSE 360-4.14 MG/200ML-% IV SOLN
INTRAVENOUS | Status: AC
  Administered 2011-11-23: 200 mL
  Filled 2011-11-23: qty 200

## 2011-11-23 MED ORDER — DIGOXIN 0.25 MG/ML IJ SOLN
0.5000 mg | Freq: Once | INTRAMUSCULAR | Status: AC
  Administered 2011-11-23: 0.5 mg via INTRAVENOUS
  Filled 2011-11-23: qty 2

## 2011-11-23 MED ORDER — MAGNESIUM SULFATE 40 MG/ML IJ SOLN
4.0000 g | Freq: Once | INTRAMUSCULAR | Status: AC
  Administered 2011-11-23: 4 g via INTRAVENOUS
  Filled 2011-11-23: qty 100

## 2011-11-23 MED ORDER — AMIODARONE LOAD VIA INFUSION
150.0000 mg | Freq: Once | INTRAVENOUS | Status: AC
  Administered 2011-11-23: 150 mg via INTRAVENOUS
  Filled 2011-11-23: qty 83.34

## 2011-11-23 MED ORDER — SODIUM PHOSPHATE 3 MMOLE/ML IV SOLN
20.0000 mmol | Freq: Once | INTRAVENOUS | Status: AC
  Administered 2011-11-23: 20 mmol via INTRAVENOUS
  Filled 2011-11-23: qty 6.67

## 2011-11-23 MED ORDER — PHENYLEPHRINE HCL 10 MG/ML IJ SOLN
30.0000 ug/min | INTRAVENOUS | Status: DC
  Administered 2011-11-23: 100 ug/min via INTRAVENOUS
  Administered 2011-11-24: 20 ug/min via INTRAVENOUS
  Filled 2011-11-23 (×2): qty 4

## 2011-11-23 MED ORDER — INSULIN ASPART 100 UNIT/ML ~~LOC~~ SOLN
0.0000 [IU] | SUBCUTANEOUS | Status: DC
  Administered 2011-11-23: 5 [IU] via SUBCUTANEOUS
  Administered 2011-11-23: 3 [IU] via SUBCUTANEOUS
  Administered 2011-11-23: 8 [IU] via SUBCUTANEOUS
  Administered 2011-11-23 – 2011-11-24 (×2): 3 [IU] via SUBCUTANEOUS
  Administered 2011-11-24 – 2011-11-26 (×2): 2 [IU] via SUBCUTANEOUS
  Administered 2011-11-27: 3 [IU] via SUBCUTANEOUS
  Administered 2011-11-27 (×2): 2 [IU] via SUBCUTANEOUS
  Administered 2011-11-27: 5 [IU] via SUBCUTANEOUS
  Administered 2011-11-27 (×2): 3 [IU] via SUBCUTANEOUS
  Administered 2011-11-28: 2 [IU] via SUBCUTANEOUS
  Administered 2011-11-28: 3 [IU] via SUBCUTANEOUS

## 2011-11-23 NOTE — Progress Notes (Signed)
INITIAL ADULT NUTRITION ASSESSMENT Date: 11/23/2011   Time: 2:39 PM  Reason for Assessment: MST score of 2, VDRF  INTERVENTION: If patient unable to be extubated, recommend initiate nutrition support. If unable to feed enterally due to bowel perforation, recommend TPN per pharmacy.   ASSESSMENT: Female 78 y.o.  Dx: Septic shock  Hx:  Past Medical History  Diagnosis Date  . Cancer 07/08/11    follicular B cell lymphoma  . TIA (transient ischemic attack)   . Arthritis   . Hypertension   . Esophageal abnormality     strictures  . Arthritis 09/04/2011  . Dehydration 11/20/2011  . Diarrhea 11/20/2011  . Complication of anesthesia   . PONV (postoperative nausea and vomiting)     in the 1980's  . GERD (gastroesophageal reflux disease)   . Esophageal stricture     x 2    Related Meds:     . albuterol  4 puff Inhalation Q4H  . antiseptic oral rinse  15 mL Mouth Rinse QID  . calcium gluconate  1 g Intravenous Once  . calcium gluconate  2 g Intravenous Once  . chlorhexidine  15 mL Mouth Rinse BID  . ciprofloxacin  400 mg Intravenous BID  . enoxaparin (LOVENOX) injection  40 mg Subcutaneous Q24H  . etomidate      . famotidine (PEPCID) IV  20 mg Intravenous Q12H  . fentaNYL      . fentaNYL      . fentaNYL  100 mcg Intravenous Once  . fentaNYL  100 mcg Intravenous Once  . fluconazole (DIFLUCAN) IV  200 mg Intravenous Q24H  . hydrocortisone sod succinate (SOLU-CORTEF) injection  50 mg Intravenous Q6H  . insulin aspart  0-15 Units Subcutaneous Q4H  . lidocaine (cardiac) 100 mg/73ml      . magnesium sulfate 1 - 4 g bolus IVPB  2 g Intravenous Once  . metronidazole  500 mg Intravenous Q8H  . midazolam      . midazolam  2 mg Intravenous Once  . midazolam  2 mg Intravenous Once  . pantoprazole (PROTONIX) IV  40 mg Intravenous QHS  . rocuronium      . sodium chloride  1,000 mL Intravenous Once  . sodium chloride  1,000 mL Intravenous Once  . sodium chloride  500 mL Intravenous  Once  . sodium phosphate  Dextrose 5% IVPB  20 mmol Intravenous Once  . succinylcholine      . DISCONTD: sodium chloride  25 mL/kg Intravenous Once    Ht: 4\' 11"  (149.9 cm)  Wt: 116 lb 10 oz (52.9 kg)  Ideal Wt: 43.2 kg  % Ideal Wt: 122%  Usual Wt: 61.8 kg % Usual Wt: 86%  Body mass index is 23.56 kg/(m^2). Patient is normal weight.   Food/Nutrition Related Hx: Unable to obtain from patient  Labs:  CMP     Component Value Date/Time   NA 125* 11/23/2011 0415   NA 134* 11/20/2011 1055   K 4.2 11/23/2011 0415   K 2.9* 11/20/2011 1055   CL 94* 11/23/2011 0415   CL 96* 11/20/2011 1055   CO2 18* 11/23/2011 0415   CO2 26 11/20/2011 1055   GLUCOSE 227* 11/23/2011 0415   GLUCOSE 167* 11/20/2011 1055   BUN 5* 11/23/2011 0415   BUN 11.0 11/20/2011 1055   CREATININE 0.45* 11/23/2011 0415   CREATININE 0.8 11/20/2011 1055   CALCIUM 6.6* 11/23/2011 0415   PROT 3.2* 11/23/2011 0415   PROT 5.4* 11/20/2011 1055  ALBUMIN 1.1* 11/23/2011 0415   ALBUMIN 2.8* 11/20/2011 1055   AST 15 11/23/2011 0415   AST 13 11/20/2011 1055   ALT 8 11/23/2011 0415   ALT 14 11/20/2011 1055   ALKPHOS 54 11/23/2011 0415   ALKPHOS 97 11/20/2011 1055   BILITOT 0.2* 11/23/2011 0415   BILITOT 0.90 11/20/2011 1055   GFRNONAA >90 11/23/2011 0415   GFRAA >90 11/23/2011 0415    Potassium  Date/Time Value Range Status  11/23/2011  4:15 AM 4.2  3.5 - 5.1 mEq/L Final  11/22/2011  5:43 PM 4.3  3.5 - 5.1 mEq/L Final  11/22/2011 12:54 PM 4.3  3.5 - 5.1 mEq/L Final     DELTA CHECK NOTED     NO VISIBLE HEMOLYSIS  11/20/2011 10:55 AM 2.9* 3.5 - 5.1 mEq/L Final    Phosphorus  Date/Time Value Range Status  11/23/2011  4:15 AM 1.7* 2.3 - 4.6 mg/dL Final    Magnesium  Date/Time Value Range Status  11/23/2011  4:15 AM 1.2* 1.5 - 2.5 mg/dL Final  2/95/2841  3:24 PM 0.6* 1.5 - 2.5 mg/dL Final     CRITICAL RESULT CALLED TO, READ BACK BY AND VERIFIED WITH:     Oretha Ellis [id number] @ 1923 BY J SCOTTON     Intake/Output Summary (Last 24 hours) at 11/23/11  1441 Last data filed at 11/23/11 1425  Gross per 24 hour  Intake 8221.85 ml  Output   2890 ml  Net 5331.85 ml    Diet Order: NPO  Supplements/Tube Feeding: None  IVF:    dextrose 5 % and 0.45 % NaCl with KCl 40 mEq/L Last Rate: 50 mL/hr at 11/23/11 1052  fentaNYL infusion INTRAVENOUS Last Rate: 50 mcg/hr (11/23/11 1400)  midazolam (VERSED) infusion Last Rate: 1 mg/hr (11/23/11 1400)  norepinephrine (LEVOPHED) Adult infusion Last Rate: 7 mcg/min (11/23/11 1400)  vasopressin (PITRESSIN) infusion - *FOR SHOCK* Last Rate: 0.03 Units/min (11/23/11 1425)  DISCONTD: sodium chloride   DISCONTD: phenylephrine (NEO-SYNEPHRINE) Adult infusion Last Rate: Stopped (11/22/11 1223)  DISCONTD:  sodium bicarbonate infusion 1000 mL Last Rate: 50 mL/hr at 11/22/11 1955    Estimated Nutritional Needs:   Kcal: 1200-1300 kcal Protein: 80-90 g Fluid: 1.6 L  Patient with B-cell lymphoma on chemotherapy, admitted with septic shock and perforated bowel. She is s/p colectomy and ostomy on 8/31. She is intubate with OG tube suction. ARDS protocol. Per MST screen, patient has lost 7 pounds in the last 3 months, and 20 pounds in 6 months. This meets the criteria for severe malnutrition in the context of chronic illness. Patient is at risk for refeeding syndrome due to severe weight loss, poor PO intake, and low phosphorus and magnesium.   NUTRITION DIAGNOSIS: -Inadequate oral intake (NI-2.1).  Status: Ongoing  RELATED TO: inability to eat  AS EVIDENCE BY: NPO status  MONITORING/EVALUATION(Goals): Patient will meet 90-100% of estimated nutrition needs  Monitor: Extubation, diet advancement or nutrition support initiation, weights, labs  EDUCATION NEEDS: -No education needs identified at this time   DOCUMENTATION CODES Per approved criteria  -Severe malnutrition in the context of chronic illness    Fabio Pierce 11/23/2011, 2:39 PM

## 2011-11-23 NOTE — Progress Notes (Signed)
eLink Physician-Brief Progress Note Patient Name: DALEXA GENTZ DOB: 05-18-1931 MRN: [id number]  Date of Service  11/23/2011   HPI/Events of Note   AF-RVR with hypotension  eICU Interventions  Discussed with son in room (EP MD) Cardioversion OK if needed Amio bolus & infusion Dig IV push 1L NS bolus for CVP 10 Replete 4 g Mag for Mg 1.2 this am    Intervention Category Major Interventions: Arrhythmia - evaluation and management  ALVA,RAKESH V. 11/23/2011, 4:02 PM

## 2011-11-23 NOTE — Progress Notes (Signed)
CRITICAL VALUE ALERT  Critical value received:  Magnesium  0.6  Date of notification:  11/22/2011  Time of notification:  1925  Critical value read back: yes  Nurse who received alert:  Conley Rolls, RN  MD notified (1st page):  E-link, MD  Time of first page:  1935  MD notified (2nd page):  Time of second page:  Responding MD:  E-Link,MD  Time MD responded:  [phone]

## 2011-11-23 NOTE — Progress Notes (Signed)
MEDICATION RELATED CONSULT NOTE   Pharmacy Consult for Amiodarone drug interactions  Allergies  Allergen Reactions  . Codeine Hives  . Other Nausea And Vomiting    anesthesia  . Penicillins Hives  . Sulfa Antibiotics Hives    Patient Measurements: Height: 4\' 11"  (149.9 cm) Weight: 116 lb 10 oz (52.9 kg) IBW/kg (Calculated) : 43.2   Vital Signs: Temp: 97.8 F (36.6 C) (09/01 1200) Temp src: Oral (09/01 1200) BP: 91/42 mmHg (09/01 1530) Pulse Rate: 126  (09/01 1545)   Labs:  Basename 11/23/11 0415 11/22/11 1743 11/22/11 1258 11/22/11 1254 11/22/11 0330 11/21/11 1909  WBC 11.7* -- -- 1.6* 2.0* --  HGB 8.9* -- -- 8.8* 9.7* --  HCT 26.3* -- -- 25.8* 29.6* --  PLT 166 -- 178 178 -- --  APTT -- -- 31 -- 33 --  CREATININE 0.45* 0.43* -- 0.54 -- --  LABCREA -- -- -- -- -- --  CREATININE 0.45* 0.43* -- 0.54 -- --  CREAT24HRUR -- -- -- -- -- --  MG 1.2* 0.6* -- -- -- --  PHOS 1.7* -- -- -- -- --  ALBUMIN 1.1* -- -- -- -- 2.5*  PROT 3.2* -- -- -- -- 5.1*  ALBUMIN 1.1* -- -- -- -- 2.5*  AST 15 -- -- -- -- 13  ALT 8 -- -- -- -- 12  ALKPHOS 54 -- -- -- -- 63  BILITOT 0.2* -- -- -- -- 0.6  BILIDIR <0.1 -- -- -- -- --  IBILI NOT CALCULATED -- -- -- -- --   Estimated Creatinine Clearance: 43.8 ml/min (by C-G formula based on Cr of 0.45).   Medical History: Past Medical History  Diagnosis Date  . Cancer 07/08/11    follicular B cell lymphoma  . TIA (transient ischemic attack)   . Arthritis   . Hypertension   . Esophageal abnormality     strictures  . Arthritis 09/04/2011  . Dehydration 11/20/2011  . Diarrhea 11/20/2011  . Complication of anesthesia   . PONV (postoperative nausea and vomiting)     in the 1980's  . GERD (gastroesophageal reflux disease)   . Esophageal stricture     x 2    Medications:  Scheduled:    . albuterol  4 puff Inhalation Q4H  . amiodarone (NEXTERONE PREMIX) 360 mg/200 mL dextrose      . amiodarone  150 mg Intravenous Once  . antiseptic  oral rinse  15 mL Mouth Rinse QID  . calcium gluconate  2 g Intravenous Once  . chlorhexidine  15 mL Mouth Rinse BID  . ciprofloxacin  400 mg Intravenous BID  . digoxin  0.5 mg Intravenous Once  . enoxaparin (LOVENOX) injection  40 mg Subcutaneous Q24H  . famotidine (PEPCID) IV  20 mg Intravenous Q12H  . fentaNYL  100 mcg Intravenous Once  . fentaNYL  100 mcg Intravenous Once  . fluconazole (DIFLUCAN) IV  200 mg Intravenous Q24H  . hydrocortisone sod succinate (SOLU-CORTEF) injection  50 mg Intravenous Q6H  . insulin aspart  0-15 Units Subcutaneous Q4H  . magnesium sulfate 1 - 4 g bolus IVPB  2 g Intravenous Once  . magnesium sulfate 1 - 4 g bolus IVPB  4 g Intravenous Once  . metronidazole  500 mg Intravenous Q8H  . midazolam      . midazolam  2 mg Intravenous Once  . midazolam  2 mg Intravenous Once  . pantoprazole (PROTONIX) IV  40 mg Intravenous QHS  . sodium chloride  1,000 mL Intravenous Once  . sodium chloride  1,000 mL Intravenous Once  . sodium chloride  1,000 mL Intravenous Once  . sodium chloride  500 mL Intravenous Once  . sodium phosphate  Dextrose 5% IVPB  20 mmol Intravenous Once  . DISCONTD: sodium chloride  25 mL/kg Intravenous Once   Infusions:    . amiodarone (NEXTERONE PREMIX) 360 mg/200 mL dextrose 1 mg/min (11/23/11 1618)   Followed by  . amiodarone (NEXTERONE PREMIX) 360 mg/200 mL dextrose    . dextrose 5 % and 0.45 % NaCl with KCl 40 mEq/L 50 mL/hr at 11/23/11 1052  . fentaNYL infusion INTRAVENOUS 50 mcg/hr (11/23/11 1500)  . midazolam (VERSED) infusion 1 mg/hr (11/23/11 1500)  . norepinephrine (LEVOPHED) Adult infusion 6 mcg/min (11/23/11 1500)  . phenylephrine (NEO-SYNEPHRINE) Adult infusion    . vasopressin (PITRESSIN) infusion - *FOR SHOCK* 0.03 Units/min (11/23/11 1500)  . DISCONTD: sodium chloride    . DISCONTD: phenylephrine (NEO-SYNEPHRINE) Adult infusion Stopped (11/22/11 1223)  . DISCONTD:  sodium bicarbonate infusion 1000 mL 50 mL/hr at  11/22/11 1955   PRN: midazolam, norepinephrine (LEVOPHED) Adult infusion, ondansetron, sodium chloride, DISCONTD: DOBUTamine  Assessment:  79 YOF started on amiodarone IV continuous infusion. Pharmacy asked to evaluate for drug interactions.  Noted potential interaction with Cipro, fluconazole, Flagyl and Ondasetron - all may increase QTc and increase risk of torsades.  RN reports QTc of 450s today. 12 lead EKG 8/31 shows QTc of 433s  Goal of Therapy:  QTc <500  Plan:  Notifed Dr Vassie Loll of interactions Monitor QTc  Gwen Her PharmD  [phone] 11/23/2011 4:28 PM

## 2011-11-23 NOTE — Consult Note (Signed)
CARDIOLOGY CONSULT NOTE  Patient ID: [name] MRN: [id number] DOB/AGE: 1931-09-03 78 y.o.  Admit date: 11/21/2011 Primary Physician Abner Greenspan, MD Primary Cardiologist   None Chief Complaint  Atrial fibrillation  HPI:  The patient was admitted with perforated diverticular disease with peritonitis on 8/30.  She underwent sigmoid colon resection with colostomy yesterday.  She is in the ICU intubated and sedated.  She was treated with sepsis protocol.  At 1549 this afternoon she developed atrial fib with rapid rate near 200 with a SBP briefly in the 50s.  She was treated with IV dig and amiodarone bolus and drip with improvement in rate and pressures.  CVP is 15.  She is currently on phenylephrine and  Vasopressin.  I spoke with both of her sons who are physicians.  She has never had a cardiac history although there was a cath reported 10 years ago.  She has rare palpitations but does not describe chest pain or SOB.     Past Medical History  Diagnosis Date  . Cancer 07/08/11    follicular B cell lymphoma  . TIA (transient ischemic attack)   . Arthritis   . Hypertension   . Esophageal abnormality     strictures  . Arthritis 09/04/2011  . Dehydration 11/20/2011  . Diarrhea 11/20/2011  . Complication of anesthesia   . PONV (postoperative nausea and vomiting)     in the 1980's  . GERD (gastroesophageal reflux disease)   . Esophageal stricture     x 2    Past Surgical History  Procedure Date  . Appendectomy   . Parotidectomy w/ neck dissection total   . Tonsillectomy   . Abdominal hysterectomy     left ovaries    Allergies  Allergen Reactions  . Codeine Hives  . Other Nausea And Vomiting    anesthesia  . Penicillins Hives  . Sulfa Antibiotics Hives   Prescriptions prior to admission  Medication Sig Dispense Refill  . acetaminophen (TYLENOL) 500 MG tablet Take 500 mg by mouth every 6 (six) hours as needed. For pain      . allopurinol (ZYLOPRIM) 100 MG tablet Take 100  mg by mouth 2 (two) times daily.      [name] amLODipine-atorvastatin (CADUET) 5-40 MG per tablet Take 1 tablet by mouth daily.      . clopidogrel (PLAVIX) 75 MG tablet Take 75 mg by mouth daily.      . famotidine (PEPCID) 10 MG tablet Take 10 mg by mouth daily.      [name] lactulose (CHRONULAC) 10 GM/15ML solution Take 10 g by mouth 3 (three) times daily.      [name] lidocaine-prilocaine (EMLA) cream Apply 1 application topically as needed. Pain      . loratadine (CLARITIN) 10 MG tablet Take 10 mg by mouth as needed. Allergies      . metoprolol (LOPRESSOR) 100 MG tablet Take 50 mg by mouth 2 (two) times daily.       . predniSONE (DELTASONE) 20 MG tablet Take 160 mg by mouth daily. Take daily for 5 days      . pregabalin (LYRICA) 25 MG capsule Take 25 mg by mouth at bedtime.      . prochlorperazine (COMPAZINE) 10 MG tablet Take 10 mg by mouth every 6 (six) hours as needed. Nausea      . valsartan (DIOVAN) 160 MG tablet Take 160 mg by mouth daily.       Family History  Problem Relation Age of Onset  .  Cancer Mother 15    gastric  . Hypertension Father     heart disease  . Cancer Maternal Aunt 33    breast cancer  . Cancer Maternal Uncle 41    colon    History   Social History  . Marital Status: Married    Spouse Name: N/A    Number of Children: N/A  . Years of Education: N/A   Occupational History  . Not on file.   Social History Main Topics  . Smoking status: Never Smoker   . Smokeless tobacco: Never Used  . Alcohol Use: No  . Drug Use: No  . Sexually Active: Yes   Other Topics Concern  . Not on file   Social History Narrative  . No narrative on file     ROS:  Intubated and sedated.  Physical Exam: Blood pressure 91/42, pulse 126, temperature 97.8 F (36.6 C), temperature source Oral, resp. rate 20, height 4\' 11"  (1.499 m), weight 116 lb 10 oz (52.9 kg), SpO2 97.00%.  GENERAL:  Intubated HEENT:  Pupils equal round and reactive, fundi not visualized  NECK:  No  bruits LYMPHATICS:  No cervical, inguinal adenopathy LUNGS:  Clear to auscultation bilaterally BACK:  No CVA tenderness CHEST:  Unremarkable HEART:  PMI not displaced or sustained,S1 and S2 within normal limits, no S3, no S4, no clicks, no rubs, no murmurs ABD:  Flat, bandaged.  No bowel sounds. EXT:  2 plus pulses throughout, no edema, no cyanosis no clubbing, diffuse edema SKIN:  No rashes no nodules    Labs: Lab Results  Component Value Date   BUN 6 11/23/2011   Lab Results  Component Value Date   CREATININE 0.44* 11/23/2011   Lab Results  Component Value Date   NA 124* 11/23/2011   K 3.6 11/23/2011   CL 96 11/23/2011   CO2 19 11/23/2011   Lab Results  Component Value Date   TROPONINI <0.30 11/23/2011   Lab Results  Component Value Date   WBC 11.7* 11/23/2011   HGB 8.9* 11/23/2011   HCT 26.3* 11/23/2011   MCV 89.2 11/23/2011   PLT 166 11/23/2011   No results found for this basename: CHOL, HDL, LDLCALC, LDLDIRECT, TRIG, CHOLHDL   Lab Results  Component Value Date   ALT 8 11/23/2011   AST 15 11/23/2011   ALKPHOS 54 11/23/2011   BILITOT 0.2* 11/23/2011    Telemetry:  Atrial fibrillation with rapid ventricular rate  ASSESSMENT AND PLAN:   Atrial fibrillation:  The patient converted to NSR while I was talking to her son in the waiting room.  At this point I would continue with IV amiodarone and check an echo since she has never had one.  Otherwise no further change in therapy.  She can continue excellent management by the surgical and CCM services.    SignedRollene Rotunda 11/23/2011, 6:42 PM

## 2011-11-23 NOTE — Progress Notes (Signed)
Name: [name] MRN: [id number] DOB: 23-Mar-1932    LOS: 2  Referring Provider:  Dr. Gerrit Friends  Reason for Referral:  Septic shock  PULMONARY / CRITICAL CARE MEDICINE  HPI:  [name] is a 78 year old woman with past medical history significant for low-grade B-cell lymphoma for which she is currently undergoing chemotherapy who presented to the [facility] long emergency room with 4-5 days of abdominal pain.  A CT scan was done and showed free air in the abdomen consistent with perforated bowel.  She was admitted to Dr. Gerrit Friends on the surgery service.  CCM was consulted for hypotension and developing septic shock.  Of note, [name] last received chemotherapy a week ago and her white count is currently 0.4.  Brief patient description:  78 year-old with B-cell lymphoma on chemo, perforated bowel and septic shock  Events Since Admission: Laparotomy, sigmoid colectomy and ostomy 8/31  Current Status: Critical  Subjective:  Tolerated surgery  Pressors being weaned Bicarb still running Good UOP WUA pending this am  Vital Signs: Temp:  [97.1 F (36.2 C)-99 F (37.2 C)] 97.5 F (36.4 C) (09/01 0800) Pulse Rate:  [89-146] 99  (09/01 1015) Resp:  [11-22] 17  (09/01 1015) BP: (80-154)/(42-72) 96/42 mmHg (09/01 1000) SpO2:  [96 %-100 %] 99 % (09/01 1015) Arterial Line BP: (94-180)/(47-87) 114/57 mmHg (09/01 1015) FiO2 (%):  [30 %-40 %] 30 % (09/01 0823)  Intake/Output Summary (Last 24 hours) at 11/23/11 1023 Last data filed at 11/23/11 1000  Gross per 24 hour  Intake 10182.75 ml  Output   2860 ml  Net 7322.75 ml   Physical Examination: General:  Elderly woman, laying in bed, pale, but in no respiratory distress Neuro:  Able to answer questions appropriately, alert and oriented x3 HEENT:  Pupils equally round and reactive to light, extraocular muscles intact Neck:  Supple Cardiovascular:  Tachycardic, regular rhythm, no murmurs, rubs, or gallops Lungs:  Clear to auscultation  bilaterally Abdomen:  Tender to palpation, absent bowel sounds Musculoskeletal:  No joint abnormalities Skin:  No rashes.    Principal Problem:  *Septic shock Active Problems:  Diverticulitis of colon with perforation  Acute respiratory failure   ASSESSMENT AND PLAN  PULMONARY  Lab 11/23/11 0355 11/22/11 1730 11/22/11 1648 11/22/11 1147 11/22/11 1031 11/22/11 0400  PHART 7.374 -- 7.340* 7.243* 7.239* 7.326*  PCO2ART 29.3* -- 30.6* 44.3 34.6* 32.8*  PO2ART 107.0* -- 114.0* 82.3 98.9 79.7*  HCO3 16.9* -- 16.1* 18.5* 14.2* 16.6*  O2SAT 99.3 73.7 98.9 94.2 96.1 --   Ventilator Settings: Vent Mode:  [-] PRVC FiO2 (%):  [30 %-40 %] 30 % Set Rate:  [18 bmp] 18 bmp Vt Set:  [350 mL] 350 mL PEEP:  [5 cmH20] 5 cmH20 Plateau Pressure:  [13 cmH20-17 cmH20] 13 cmH20 CXR: 9/1 >> evolving LL atx + effusions, B interstitial infiltrates ETT:  8/31 >>   A:  Evolving B interstitial infiltrates, suspect atx + ARDS (CVP only 8 on 9/1) P:   - low VT per ARDS protocol, leaving her on 8cc/kg right now to minimize atelectatic change and maximize ventilation. Her Ppeak and Pplat have been acceptable - not in a position right now to drive CVP down per FACTT, will attempt to do so when/if pressors weaned   CARDIOVASCULAR  Lab 11/23/11 0415 11/22/11 1259 11/22/11 0330  TROPONINI -- -- <0.30  LATICACIDVEN 5.4* 5.8* 4.7*  PROBNP -- -- --   ECG:  Sinus tachycardia Lines: L Aguilar CVC 8/31 >>  A: Septic shock, GI source, cortisol 8/31 18.7. Lactic acidosis (pH normalized 9/1) P:  - EGDT, pressors as ordered - Bolus normal saline to maintain CVP 8- 12 - stress dose steroids added 8/31 - stop Na bicarb 9/1, follow ABG  RENAL  Lab 11/23/11 0415 11/22/11 1743 11/22/11 1254 11/22/11 1253 11/22/11 0330 11/21/11 1909  NA 125* 127* 129* -- 132* 135  K 4.2 4.3 -- -- -- --  CL 94* 102 101 -- 101 98  CO2 18* 16* 17* -- 19 26  BUN 5* 5* 6 -- 9 10  CREATININE 0.45* 0.43* 0.54 0.54 0.69 --  CALCIUM  6.6* 5.8* 5.9* -- 6.8* 8.3*  MG 1.2* 0.6* -- -- -- --  PHOS 1.7* -- -- -- -- --   Intake/Output      08/31 0701 - 09/01 0700 09/01 0701 - 09/02 0700   I.V. (mL/kg) 7022.5 (132.8) 577.5 (10.9)   Other 220 60   NG/GT 30 30   IV Piggyback 2850 200   Total Intake(mL/kg) 10122.5 (191.4) 867.5 (16.4)   Urine (mL/kg/hr) 2580 (2) 290   Emesis/NG output 100    Blood 50    Total Output 2730 290   Net +7392.5 +577.5         Foley:  Placed 11/21/2011  A:  Hypophosphatemia, Hypomagnesemia, Hypocalcemia (Ca corrects to 8.5 for albumin 9/1)  P:   - Monitor and replete electrolytes as needed, repeat BMP 1600 - Monitor urine output - phos given 9/1  GASTROINTESTINAL  Lab 11/23/11 0415 11/21/11 1909 11/20/11 1055  AST 15 13 13   ALT 8 12 14   ALKPHOS 54 63 97  BILITOT 0.2* 0.6 0.90  PROT 3.2* 5.1* 5.4*  ALBUMIN 1.1* 2.5* 2.8*    A:  Perforated bowel P:   - OG tube to low intermittent wall suction - Management of ostomy per surgery service - See ID for antibiotics  HEMATOLOGIC  Lab 11/23/11 0415 11/22/11 1258 11/22/11 1254 11/22/11 0330 11/21/11 1909  HGB 8.9* -- 8.8* 9.7* 10.1*  HCT 26.3* -- 25.8* 29.6* 29.1*  PLT 166 178 178 254 223  INR -- 1.40 -- 1.18 --  APTT -- 31 -- 33 --   A:  Follicular B cell lymphoma, currently undergoing chemotherapy P:  - Patient neutropenic due to chemotherapy and sepsis, rebounding 9/1 - Heme/onc following - Neulasta was given last week  INFECTIOUS  Lab 11/23/11 0415 11/22/11 1254 11/22/11 0330 11/21/11 1909 11/20/11 1055  WBC 11.7* 1.6* 2.0* 0.4* 1.9*  PROCALCITON 2.35 -- 4.18 -- --   Cultures: Blood 8/31 >> Urine 8/31 >>  Peritoneal abscess 8/31 >>   Antibiotics: - Cipro 8/31 >>  - Flagyl 8/31 >>  - fluconazole 8/31 >>   A:  Perforated bowel P:   - Continue to cover broadly for enteric pathogens, including fungal - continue post-op care  ENDOCRINE No results found for this basename: GLUCAP:5 in the last 168 hours A:  Risk  for hyperglycemia or hypoglycemia and adrenal insufficiency   P:   - Stress dose steroids started 8/31 - SSI added 9/1  NEUROLOGIC  A:  Maintain adequate sedation while intubated P:   - fentanyl + Versed gtt's  BEST PRACTICE / DISPOSITION Level of Care:  ICU Primary Service:  Surgery Consultants: PCCM Code Status:  Full Diet:  N.p.o. DVT Px:  Heparin GI Px:  Pepcid Skin Integrity:  Good Social / Family:  Sons and husband at bedside  CC time 45 minutes  Levy Pupa,  MD, PhD 11/23/2011, 10:23 AM [facility] [phone] or if no answer [phone]

## 2011-11-23 NOTE — Progress Notes (Signed)
She made it through surgery with flying colors! Localized perforation of sigmoid with diffuse peritonitis and pelvic abscess. Sigmoid colon resected and colostomy. Sedated at present; intubated 1 pressor stopped.Still on levophed and pitressin but tapering off.  Bicarbonate infusion. She remains afebrile on Cipro, Flagyl, Diflucan Platelets remarkable normal so far.  Hb 8.7  Exam: Wound dressing dry Extremities warm and well perfused Impression: #1.Intra-abdominal sepsis due to ruptured sigmoid diverticulum #2. Low grade non hodgkin's lymphoma on R-CVP chemo  She remains critically ill but condition stabilizing Plan per CCM & Surgery

## 2011-11-23 NOTE — Progress Notes (Signed)
Patient ID: [name], female   DOB: 1932-03-14, 78 y.o.   MRN: [id number]  General Surgery - [facility] - Progress Note  POD# 1  Subjective: Patient in ICU, intubated, sedated.  Tachycardia improved, hypotension improved.  CCM managing.  Objective: Vital signs in last 24 hours: Temp:  [97.1 F (36.2 C)-99 F (37.2 C)] 97.1 F (36.2 C) (09/01 0400) Pulse Rate:  [89-146] 93  (09/01 0600) Resp:  [11-23] 18  (09/01 0600) BP: (80-154)/(42-72) 106/55 mmHg (09/01 0600) SpO2:  [96 %-100 %] 100 % (09/01 0600) Arterial Line BP: (94-180)/(47-87) 144/62 mmHg (09/01 0600) FiO2 (%):  [30 %-40 %] 30 % (09/01 0352)    Intake/Output from previous day: 08/31 0701 - 09/01 0700 In: 9952.5 [I.V.:6872.5; NG/GT:30; IV Piggyback:2850] Out: 2730 [Urine:2580; Emesis/NG output:100; Blood:50]  Exam: HEENT - clear, not icteric Neck - soft Chest - clear bilaterally Cor - RRR, no murmur Abd - soft, dressing changed and packed, no bleeding or drainage; stoma viable  Lab Results:   Basename 11/23/11 0415 11/22/11 1258 11/22/11 1254  WBC 11.7* -- 1.6*  HGB 8.9* -- 8.8*  HCT 26.3* -- 25.8*  PLT 166 178 --     Basename 11/23/11 0415 11/22/11 1743  NA 125* 127*  K 4.2 4.3  CL 94* 102  CO2 18* 16*  GLUCOSE 227* 208*  BUN 5* 5*  CREATININE 0.45* 0.43*  CALCIUM 6.6* 5.8*    Studies/Results: Ct Abdomen Pelvis Wo Contrast  11/21/2011  *RADIOLOGY REPORT*  Clinical Data:  Black stool, stage IV lymphoma, pain all liver, weakness, diarrhea, nausea, past history of hypertension, liver cyst question diverticulitis  CT ABDOMEN AND PELVIS WITHOUT CONTRAST  Technique:  Multidetector CT imaging of the abdomen and pelvis was performed following the standard protocol without intravenous contrast.  Oral contrast was administered.  Sagittal and coronal MPR images reconstructed from axial data set.  Comparison: 11/11/2011  Findings: Dependent atelectasis bilateral lung bases. Moderate  sized hiatal hernia with retained contrast within both the hiatal hernia and within a distended distal esophagus, question related to gastroesophageal reflux. Within limits of a nonenhanced exam, no focal abnormalities of the liver, spleen, pancreas, kidneys, or adrenal glands.  Free intraperitoneal air compatible with perforated viscus new since prior exam. Numerous diverticula are identified at the descending and sigmoid colon. Extensive inflammatory process identified in the left pelvis including an extraluminal gas and fluid collections in the sigmoid mesocolon compatible with abscess. This measures up to 6.2 x 2.8 cm image 61 and extending 4.2 cm length. Significant wall thickening of the sigmoid colon, rectum, and numerous small bowel loops in the pelvis with associated free pelvic fluid.  Secondary dilatation of proximal small bowel loops. Stranding throughout the mesentery, sigmoid mesocolon and retroperitoneum including a portion of the presacral space. Appendix surgically absent by history with nonvisualization of the uterus as well. Largest residual right inguinal lymph node measures 18 x 9 mm image 75. Diffuse osseous demineralization with degenerative disc disease changes and retrolisthesis at L2-L3.  IMPRESSION: Free intraperitoneal air and fluid with extensive inflammatory process in pelvis. Wall thickening of the sigmoid colon with numerous sigmoid diverticula and presence of a 6.2 x 2.8 x 4.2 cm diameter abscess within the sigmoid mesocolon. This constellation of findings likely represents perforated diverticulitis. Extensive associated inflammatory process of the pelvis with free fluid, wall thickening of the distal sigmoid colon, rectum, and pelvic small bowel loops, and edema throughout the mesentery and retroperitoneum. Secondary proximal small bowel dilatation.  Critical Value/emergent results were called by telephone at the time of interpretation on 11/21/2011 at 2021 hours to Dr. Jeraldine Loots,  who verbally acknowledged these results.   Original Report Authenticated By: Lollie Marrow, M.D.    Dg Chest Port 1 View  11/23/2011  *RADIOLOGY REPORT*  Clinical Data: Evaluate endotracheal tube and lines.  Shortness of breath.  Esophageal stricture.  Lymphoma.  PORTABLE CHEST - 1 VIEW  Comparison: 11/22/2011  Findings: Endotracheal tube borderline low in position, 1.8 cm above carina.  Left-sided subclavian line again terminates at the mid to lower right atrium.  Right-sided Port-A-Cath terminates at the superior caval/atrial junction versus high right atrium.  A nasogastric tube extends beyond the  inferior aspect of the film.  Mild cardiomegaly.  Similar small left pleural effusion.  Suspect developing layering right pleural effusion. No pneumothorax.  Low lung volumes.  Given the decreased inspiratory effort, similar moderate congestive heart failure.  Worsening left greater than right bibasilar airspace disease.  IMPRESSION:  1.  Endotracheal tube borderline low in position, 1.8 cm above carina.  Consider retraction 2 cm. 2.  Left side subclavian line terminating at the mid to low right atrium. 3.  Worsened aeration with decreasing inspiratory effort and persistent congestive heart failure. 4.  Small left pleural effusion and adjacent atelectasis or infection are similar. 5.  Developing right sided pleural effusion and worsening right base air space disease.   Original Report Authenticated By: Consuello Bossier, M.D.    Dg Chest Port 1 View  11/22/2011  *RADIOLOGY REPORT*  Clinical Data: Status post laparoscopic abdominal surgery. Evaluate "infiltrates."  PORTABLE CHEST - 1 VIEW  Comparison: Early is made 0300 hours.  Findings: Endotracheal tube 2.7 cm above carina.  Nasogastric tube extends beyond the  inferior aspect of the film.  Right-sided Port- A-Cath with tip at low SVC.  Left-sided subclavian line terminates at mid right atrium.  Mild cardiomegaly.  Small left pleural effusion is new or slightly  increased. No pneumothorax.  Interstitial edema is minimally improved.  Increasing left lower lobe airspace disease.  Mild right infrahilar volume loss.  IMPRESSION:  1.  Slight improvement in congestive heart failure. 2.  Worsening left base aeration.  New or increased left pleural fluid with adjacent atelectasis or developing infection. 3.  Similar appearance of support apparatus.  Left subclavian line terminates at the mid right atrium.  If low SVC position is desired, retraction approximately 5 cm should be considered.   Original Report Authenticated By: Consuello Bossier, M.D.    Dg Chest Port 1 View  11/22/2011  *RADIOLOGY REPORT*  Clinical Data: Evaluate central venous catheter placement  PORTABLE CHEST - 1 VIEW  Comparison: 08/05/2011  Findings: There is a left subclavian catheter with tip in the right atrium.  No pneumothorax identified.  The right chest wall porta- catheter tip is also in the right atrium.  Heart size is normal.  No pleural effusions noted.  Diffuse pulmonary venous congestion is negative.  No airspace consolidation.  IMPRESSION:  The left subclavian catheter tip is in the right atrium.  No pneumothorax noted.   Original Report Authenticated By: Rosealee Albee, M.D.    Dg Chest Port 1v Same Day  11/22/2011  *RADIOLOGY REPORT*  Clinical Data: Status post ET tube placement  PORTABLE CHEST - 1 VIEW SAME DAY  Comparison: Earlier today  Findings: The endotracheal tube tip is above the carina.  Left subclavian catheter tip is in the right atrium.  There is a  right chest wall porta-catheter with tip in the right atrium.  Heart size is normal.  There is mild diffuse interstitial edema.  IMPRESSION:  1. Endotracheal tube tip is above the carina.a   Original Report Authenticated By: Rosealee Albee, M.D.     Assessment / Plan: 1.  Status post exploratory laparotomy with sigmoid colectomy, descending colostomy, and abscess drainage  - wound care bid  - stoma care  - IV Cipro, Flagyl,  Diflucan  Will continue to manage with CCM.  Overall good progress last 24 hours.  Limited code status noted per CCM note.  Velora Heckler, MD, [facility] Office: [phone]  11/23/2011

## 2011-11-23 NOTE — Progress Notes (Signed)
Clinical Social Work Department BRIEF PSYCHOSOCIAL ASSESSMENT 11/23/2011  Patient:  [name], [name]     Account Number:  [id number]     Admit date:  11/21/2011  Clinical Social Worker:  Doroteo Glassman  Date/Time:  11/23/2011 11:59 AM  Referred by:  Physician  Date Referred:  11/23/2011 Referred for  SNF Placement   Other Referral:   Interview type:  Family Other interview type:   Met with Pt's husband and son, Dr. [place]ne [place] ([place]    PSYCHOSOCIAL DATA Living Status:  HUSBAND Admitted from facility:   Level of care:   Primary support name:  Mr. Rickett Primary support relationship to patient:  SPOUSE Degree of support available:   Strong    CURRENT CONCERNS Current Concerns  Post-Acute Placement   Other Concerns:    SOCIAL WORK ASSESSMENT / PLAN Met with Pt's husband and son, Dr. Allyne Gee, who is an MD in [place].  Pt's other son, Dr. Allyne Gee, is an MD in [facility]. He was not present for Ax.    Discussed possible SNF recommendation and family amenable to placement.    Provided family with SNF list.  Family to review list with son who's a local MD, as they suspect that he will be more familiar with area SNFs.    CSW thanked family for their time and stated that weekday CSW will be available to assist family with placement questions.   Assessment/plan status:  Psychosocial Support/Ongoing Assessment of Needs Other assessment/ plan:   Information/referral to community resources:   SNF list provided to family.    PATIENT'S/FAMILY'S RESPONSE TO PLAN OF CARE: Family thanked CSW for time and assistance and asked that they allow son who is a local MD to review SNF list before proceeding with [facility] search.  CSW to continue to follow.  Providence Crosby, LCSWA Clinical Social Work [phone]

## 2011-11-24 ENCOUNTER — Inpatient Hospital Stay (HOSPITAL_COMMUNITY): Payer: Medicare Other

## 2011-11-24 DIAGNOSIS — K5732 Diverticulitis of large intestine without perforation or abscess without bleeding: Principal | ICD-10-CM

## 2011-11-24 DIAGNOSIS — I359 Nonrheumatic aortic valve disorder, unspecified: Secondary | ICD-10-CM

## 2011-11-24 DIAGNOSIS — I5021 Acute systolic (congestive) heart failure: Secondary | ICD-10-CM

## 2011-11-24 LAB — GLUCOSE, CAPILLARY
Glucose-Capillary: 103 mg/dL — ABNORMAL HIGH (ref 70–99)
Glucose-Capillary: 120 mg/dL — ABNORMAL HIGH (ref 70–99)
Glucose-Capillary: 185 mg/dL — ABNORMAL HIGH (ref 70–99)

## 2011-11-24 LAB — TSH: TSH: 0.252 u[IU]/mL — ABNORMAL LOW (ref 0.350–4.500)

## 2011-11-24 LAB — BLOOD GAS, ARTERIAL
Bicarbonate: 19.8 mEq/L — ABNORMAL LOW (ref 20.0–24.0)
O2 Saturation: 97.8 %
PEEP: 5 cmH2O
TCO2: 18.9 mmol/L (ref 0–100)
pO2, Arterial: 80.5 mmHg (ref 80.0–100.0)

## 2011-11-24 LAB — CBC
MCH: 30.6 pg (ref 26.0–34.0)
MCHC: 34.9 g/dL (ref 30.0–36.0)
Platelets: 132 10*3/uL — ABNORMAL LOW (ref 150–400)
RDW: 15.4 % (ref 11.5–15.5)

## 2011-11-24 LAB — MAGNESIUM
Magnesium: 1.9 mg/dL (ref 1.5–2.5)
Magnesium: 2 mg/dL (ref 1.5–2.5)

## 2011-11-24 LAB — BASIC METABOLIC PANEL
Calcium: 6.6 mg/dL — ABNORMAL LOW (ref 8.4–10.5)
GFR calc Af Amer: >90 mL/min (ref >90)
GFR calc non Af Amer: >90 mL/min (ref >90)
Glucose, Bld: 127 mg/dL — ABNORMAL HIGH (ref 70–99)
Sodium: 122 mEq/L — ABNORMAL LOW (ref 135–145)

## 2011-11-24 LAB — PROTIME-INR
INR: 1.14 (ref 0.00–1.49)
Prothrombin Time: 14.8 seconds (ref 11.6–15.2)

## 2011-11-24 MED ORDER — SODIUM CHLORIDE 0.9 % IV SOLN
INTRAVENOUS | Status: DC
  Administered 2011-11-24: 1000 mL via INTRAVENOUS
  Administered 2011-11-25 (×2): via INTRAVENOUS

## 2011-11-24 MED ORDER — ALBUTEROL SULFATE HFA 108 (90 BASE) MCG/ACT IN AERS
4.0000 | INHALATION_SPRAY | RESPIRATORY_TRACT | Status: DC | PRN
  Filled 2011-11-24: qty 6.7

## 2011-11-24 MED ORDER — VITAMINS A & D EX OINT
TOPICAL_OINTMENT | CUTANEOUS | Status: AC
  Filled 2011-11-24: qty 5

## 2011-11-24 NOTE — Progress Notes (Signed)
She remains on pressor support Acute atrial arrhythmias yesterday - converted to NSR with medical Rx Heavily sedated, not responding to verbal commands Abdomen soft Extremities now cool, edematous, but acyanotic WBC now fully recovered from chemotherapy and sepsis, in fact, WBC now rebounded @ 22,500 with 78% neutrophils Slight dip in platelets today at 132,000 which was anticipated and better than expected Impression: #1. Intra-abdominal sepsis from perforated sigmoid diverticulum complicated and likely related to profound neutropenia from recent chemotherapy. PO day # 2 emergency exploration/bowel resection/ colostomy #2. Low grade, B cell, NHL S/P 3 cycle R-CVP chemo. Neulasta given  #3. Post op atrial arrhythmias She remains critically ill Plan per CCM & surgery.

## 2011-11-24 NOTE — Progress Notes (Signed)
CARE MANAGEMENT NOTE 11/24/2011  Patient:  [name], [name]   Account Number:  [id number]  Date Initiated:  11/24/2011  Documentation initiated by:  Caylen Kuwahara  Subjective/Objective Assessment:   pt with perforated bowel requiring open lap withformation of colostomy s/p colon resection, icu-vented [id number], now septic and on vaso pressors     Action/Plan:   lives at home with family   Anticipated DC Date:  11/27/2011   Anticipated DC Plan:  HOME/SELF CARE  In-house referral  NA      DC Planning Services  NA      [facility] Home Choice  NA   Choice offered to / List presented to:  NA   DME arranged  NA      DME agency  NA     HH arranged  NA      HH agency  NA   Status of service:  In process, will continue to follow Medicare Important Message given?  NA - LOS <3 / Initial given by admissions (If response is "NO", the following Medicare IM given date fields will be blank) Date Medicare IM given:   Date Additional Medicare IM given:    Discharge Disposition:    Per UR Regulation:  Reviewed for med. necessity/level of care/duration of stay  If discussed at Long Length of Stay Meetings, dates discussed:    Comments:  [id number]/Hezzie Karim Earlene Plater, RN, BSN, CCM: CHART REVIEWED AND UPDATED. NO DISCHARGE NEEDS PRESENT AT THIS TIME. CASE MANAGEMENT [phone]

## 2011-11-24 NOTE — Progress Notes (Signed)
Name: [name] MRN: [id number] DOB: 1931-12-16    LOS: 3  Referring Provider:  Dr. Gerrit Friends  Reason for Referral:  Septic shock  PULMONARY / CRITICAL CARE MEDICINE  HPI:  78 yo female admitted 11/21/2011 5 days of abdominal pain.  Found to have perforated sigmoid diverticulum with peritonitis, and pelvic abscess.  Had laparotomy 8/31.  Developed respiratory failure and shock post-op.  PCCM consulted 8/31.  She has hx of B cell lymphoma currently on chemo as outpt.  Events Since Admission: 8/31 Laparotomy, sigmoid colectomy and ostomy 9/01 A fib with RVR  Subjective:  Back in sinus rhythm.  Weaning off pressors.  Oxygen needs improved.  Vital Signs: Temp:  [97.4 F (36.3 C)-97.9 F (36.6 C)] 97.9 F (36.6 C) (09/02 0800) Pulse Rate:  [57-157] 61  (09/02 1100) Resp:  [0-23] 17  (09/02 1100) BP: (56-143)/(33-77) 131/55 mmHg (09/02 0820) SpO2:  [77 %-100 %] 100 % (09/02 1100) Arterial Line BP: (51-142)/(32-70) 134/58 mmHg (09/01 2015) FiO2 (%):  [30 %] 30 % (09/02 1100) Weight:  [148 lb 9.4 oz (67.4 kg)] 148 lb 9.4 oz (67.4 kg) (09/02 0300)  Intake/Output Summary (Last 24 hours) at 11/24/11 1104 Last data filed at 11/24/11 0900  Gross per 24 hour  Intake 3386.1 ml  Output   1165 ml  Net 2221.1 ml   Physical Examination: General: Ill appearing Neuro: Sedated HEENT:  ETT in place Neck:  Supple Cardiovascular: s1s2 regular, no murmur Lungs: decreased breath sounds, no wheeze Abdomen:  Wound dressings clean Musculoskeletal:  No edema Skin:  No rashes.    Dg Chest Port 1 View  11/24/2011  *RADIOLOGY REPORT*  Clinical Data: Respiratory failure.  Bowel perforation and status post partial colectomy.  PORTABLE CHEST - 1 VIEW  Comparison: 11/23/2011  Findings: Endotracheal tube projects just above the carina.  There remains relatively stable atelectasis involving both lower lobes. There may be components of bilateral pleural effusions.  No pulmonary edema identified.   IMPRESSION: Persistent atelectasis of both lower lobes and possible bilateral pleural effusions.   Original Report Authenticated By: Reola Calkins, M.D.    Dg Chest Port 1 View  11/23/2011  *RADIOLOGY REPORT*  Clinical Data: Evaluate endotracheal tube and lines.  Shortness of breath.  Esophageal stricture.  Lymphoma.  PORTABLE CHEST - 1 VIEW  Comparison: 11/22/2011  Findings: Endotracheal tube borderline low in position, 1.8 cm above carina.  Left-sided subclavian line again terminates at the mid to lower right atrium.  Right-sided Port-A-Cath terminates at the superior caval/atrial junction versus high right atrium.  A nasogastric tube extends beyond the  inferior aspect of the film.  Mild cardiomegaly.  Similar small left pleural effusion.  Suspect developing layering right pleural effusion. No pneumothorax.  Low lung volumes.  Given the decreased inspiratory effort, similar moderate congestive heart failure.  Worsening left greater than right bibasilar airspace disease.  IMPRESSION:  1.  Endotracheal tube borderline low in position, 1.8 cm above carina.  Consider retraction 2 cm. 2.  Left side subclavian line terminating at the mid to low right atrium. 3.  Worsened aeration with decreasing inspiratory effort and persistent congestive heart failure. 4.  Small left pleural effusion and adjacent atelectasis or infection are similar. 5.  Developing right sided pleural effusion and worsening right base air space disease.   Original Report Authenticated By: Consuello Bossier, M.D.    Dg Chest Port 1 View  11/22/2011  *RADIOLOGY REPORT*  Clinical Data: Status post laparoscopic abdominal surgery. Evaluate "  infiltrates."  PORTABLE CHEST - 1 VIEW  Comparison: Early is made 0300 hours.  Findings: Endotracheal tube 2.7 cm above carina.  Nasogastric tube extends beyond the  inferior aspect of the film.  Right-sided Port- A-Cath with tip at low SVC.  Left-sided subclavian line terminates at mid right atrium.  Mild  cardiomegaly.  Small left pleural effusion is new or slightly increased. No pneumothorax.  Interstitial edema is minimally improved.  Increasing left lower lobe airspace disease.  Mild right infrahilar volume loss.  IMPRESSION:  1.  Slight improvement in congestive heart failure. 2.  Worsening left base aeration.  New or increased left pleural fluid with adjacent atelectasis or developing infection. 3.  Similar appearance of support apparatus.  Left subclavian line terminates at the mid right atrium.  If low SVC position is desired, retraction approximately 5 cm should be considered.   Original Report Authenticated By: Consuello Bossier, M.D.      ASSESSMENT AND PLAN  PULMONARY  Lab 11/24/11 1610 11/23/11 1613 11/23/11 0355 11/22/11 1730 11/22/11 1648 11/22/11 1147  PHART 7.377 7.367 7.374 -- 7.340* 7.243*  PCO2ART 34.1* 34.4* 29.3* -- 30.6* 44.3  PO2ART 80.5 69.2* 107.0* -- 114.0* 82.3  HCO3 19.8* 19.3* 16.9* -- 16.1* 18.5*  O2SAT 97.8 94.9 99.3 73.7 98.9 --   Ventilator Settings: Vent Mode:  [-] PRVC FiO2 (%):  [30 %] 30 % Set Rate:  [18 bmp] 18 bmp Vt Set:  [350 mL] 350 mL PEEP:  [5 cmH20] 5 cmH20 Plateau Pressure:  [14 cmH20-16 cmH20] 15 cmH20  ETT:  8/31 >>   A: Acute respiratory failure in setting of septic shock from peritonitis. P:   F/u CXR, ABG Continue full vent support until more stable Keep even fluid balance No hx of obstructive lung disease or smoking>>change BD's to prn  CARDIOVASCULAR  Lab 11/23/11 1611 11/23/11 0415 11/22/11 1259 11/22/11 0330  TROPONINI <0.30 -- -- <0.30  LATICACIDVEN -- 5.4* 5.8* 4.7*  PROBNP -- -- -- --   Lines:  [place] CVL 8/31 >>   A: Septic shock 2nd to peritonitis. A fib with RVR>>back in sinus 9/02. P:  Wean pressors to keep MAP > 65 Adjust fluids to keep CVP > 8 Continue stress steroids until off pressors F/u Echo from 9/01>>  RENAL  Lab 11/24/11 0513 11/23/11 2349 11/23/11 2120 11/23/11 1611 11/23/11 0415 11/22/11 1743  NA  122* -- 121* 124* 125* 127*  K 3.8 -- 3.7 -- -- --  CL 94* -- 94* 96 94* 102  CO2 21 -- 20 19 18* 16*  BUN 8 -- 6 6 5* 5*  CREATININE 0.46* -- 0.43* 0.44* 0.45* 0.43*  CALCIUM 6.6* -- 6.4* 6.0* 6.6* 5.8*  MG 1.9 2.0 -- -- 1.2* 0.6*  PHOS 2.2* -- -- -- 1.7* --   Intake/Output      09/01 0701 - 09/02 0700 09/02 0701 - 09/03 0700   I.V. (mL/kg) 2555.7 (37.9) 80.4 (1.2)   Other 580 80   NG/GT 30    IV Piggyback 1115    Total Intake(mL/kg) 4280.7 (63.5) 160.4 (2.4)   Urine (mL/kg/hr) 925 (0.6) 30 (0.1)   Emesis/NG output 500    Blood     Total Output 1425 30   Net +2855.7 +130.4         Foley:  Placed 11/21/2011  A: Hyponatremia. P:   Normal saline IV fluid F/u BMET Monitor renal fx, urine outpt Check TSH  GASTROINTESTINAL  Lab 11/23/11 0415 11/21/11 1909  11/20/11 1055  AST 15 13 13   ALT 8 12 14   ALKPHOS 54 63 97  BILITOT 0.2* 0.6 0.90  PROT 3.2* 5.1* 5.4*  ALBUMIN 1.1* 2.5* 2.8*    A:  Perforated sigmoid diverticulum s/p laparotomy. Nutrition. P:   Post-op care per CCS ?if she may need TNA>>defer decision to CCS  HEMATOLOGIC  Lab 11/24/11 0513 11/23/11 0415 11/22/11 1258 11/22/11 1254 11/22/11 0330 11/21/11 1909  HGB 8.1* 8.9* -- 8.8* 9.7* 10.1*  HCT 23.2* 26.3* -- 25.8* 29.6* 29.1*  PLT 132* 166 178 178 254 --  INR 1.14 -- 1.40 -- 1.18 --  APTT -- -- 31 -- 33 --   A:  Follicular B cell lymphoma, currently undergoing chemotherapy. Anemia of critical illness, and chronic disease. P:  F/u CBC Transfuse for Hb < 7  INFECTIOUS  Lab 11/24/11 0513 11/23/11 0415 11/22/11 1254 11/22/11 0330 11/21/11 1909  WBC 22.5* 11.7* 1.6* 2.0* 0.4*  PROCALCITON -- 2.35 -- 4.18 --   Cultures: Blood 8/31 >> Urine 8/31 >>negative Peritoneal abscess 8/31 >>   Antibiotics: - Cipro 8/31 >>  - Flagyl 8/31 >>  - fluconazole 8/31 >>   A:  Peritonitis 2nd to sigmoid perforation. P:   D3/x cipro, flagyl, diflucan  ENDOCRINE  Lab 11/24/11 0732 11/24/11 0346 11/24/11  0046 11/23/11 1955 11/23/11 1634  GLUCAP 110* 130* 185* 186* 204*   A:  Hyperglycemia. P:   SSI  NEUROLOGIC  A:  Sedation. P:   Sedation protocol while on vent  BEST PRACTICE / DISPOSITION Level of Care:  ICU Primary Service:  Surgery Consultants: PCCM, [place] Cards, Oncology Code Status:  Limited>>no CPR, no defibrillation Diet:  N.p.o. DVT Px:  Lovenox GI Px:  Protonix Skin Integrity:  Good Social / Family: No family at bedside  CC time 45 minutes  Coralyn Helling, MD [facility]/Critical Care 11/24/2011, 11:24 AM Pager:  [phone] After 3pm call: [phone]

## 2011-11-24 NOTE — Progress Notes (Signed)
Noted a change in her heart rhythm;junctional alternating with sinus. Please refer to strips on chart and 12lead EKGs with both NSR and Junctional. Aminodarone infusion decreased to 30mg Michae Kava, PA for [facility], notified.  He ordered to stop the Aminodarone infusion if the heart rate reaches 50. Dr. Vassie Loll, CCM also informed.

## 2011-11-24 NOTE — Progress Notes (Signed)
Patient ID: [name], female   DOB: 12/14/1931, 78 y.o.   MRN: [id number]  General Surgery - [facility] - Progress Note  POD# 2  Subjective: Patient in ICU, sedated, vent.  Arrhythmia problems overnight.  Still on pressor support.  Objective: Vital signs in last 24 hours: Temp:  [97.4 F (36.3 C)-97.9 F (36.6 C)] 97.9 F (36.6 C) (09/02 0800) Pulse Rate:  [59-157] 73  (09/02 0600) Resp:  [0-22] 17  (09/02 0600) BP: (56-143)/(33-77) 108/49 mmHg (09/02 0329) SpO2:  [94 %-100 %] 99 % (09/02 0600) Arterial Line BP: (51-142)/(32-70) 134/58 mmHg (09/01 2015) FiO2 (%):  [30 %] 30 % (09/02 0329) Weight:  [148 lb 9.4 oz (67.4 kg)] 148 lb 9.4 oz (67.4 kg) (09/02 0300)    Intake/Output from previous day: 09/01 0701 - 09/02 0700 In: 4226.4 [I.V.:2501.4; NG/GT:30; IV Piggyback:1115] Out: 1425 [Urine:925; Emesis/NG output:500]  Exam: HEENT - clear, not icteric, OG with bilious output Neck - soft Chest - coarse bilaterally Cor - RRR, no murmur Abd - mild distension; dressing clean and dry; stoma viable, no significant output Ext - no significant edema Neuro - sedated, non-focal  Lab Results:   [facility] 11/24/11 0513 11/23/11 0415  WBC 22.5* 11.7*  HGB 8.1* 8.9*  HCT 23.2* 26.3*  PLT 132* 166     Basename 11/24/11 0513 11/23/11 2120  NA 122* 121*  K 3.8 3.7  CL 94* 94*  CO2 21 20  GLUCOSE 127* 191*  BUN 8 6  CREATININE 0.46* 0.43*  CALCIUM 6.6* 6.4*    Studies/Results: Dg Chest Port 1 View  11/23/2011  *RADIOLOGY REPORT*  Clinical Data: Evaluate endotracheal tube and lines.  Shortness of breath.  Esophageal stricture.  Lymphoma.  PORTABLE CHEST - 1 VIEW  Comparison: 11/22/2011  Findings: Endotracheal tube borderline low in position, 1.8 cm above carina.  Left-sided subclavian line again terminates at the mid to lower right atrium.  Right-sided Port-A-Cath terminates at the superior caval/atrial junction versus high right atrium.  A nasogastric  tube extends beyond the  inferior aspect of the film.  Mild cardiomegaly.  Similar small left pleural effusion.  Suspect developing layering right pleural effusion. No pneumothorax.  Low lung volumes.  Given the decreased inspiratory effort, similar moderate congestive heart failure.  Worsening left greater than right bibasilar airspace disease.  IMPRESSION:  1.  Endotracheal tube borderline low in position, 1.8 cm above carina.  Consider retraction 2 cm. 2.  Left side subclavian line terminating at the mid to low right atrium. 3.  Worsened aeration with decreasing inspiratory effort and persistent congestive heart failure. 4.  Small left pleural effusion and adjacent atelectasis or infection are similar. 5.  Developing right sided pleural effusion and worsening right base air space disease.   Original Report Authenticated By: Consuello Bossier, M.D.    Dg Chest Port 1 View  11/22/2011  *RADIOLOGY REPORT*  Clinical Data: Status post laparoscopic abdominal surgery. Evaluate "infiltrates."  PORTABLE CHEST - 1 VIEW  Comparison: Early is made 0300 hours.  Findings: Endotracheal tube 2.7 cm above carina.  Nasogastric tube extends beyond the  inferior aspect of the film.  Right-sided Port- A-Cath with tip at low SVC.  Left-sided subclavian line terminates at mid right atrium.  Mild cardiomegaly.  Small left pleural effusion is new or slightly increased. No pneumothorax.  Interstitial edema is minimally improved.  Increasing left lower lobe airspace disease.  Mild right infrahilar volume loss.  IMPRESSION:  1.  Slight improvement  in congestive heart failure. 2.  Worsening left base aeration.  New or increased left pleural fluid with adjacent atelectasis or developing infection. 3.  Similar appearance of support apparatus.  Left subclavian line terminates at the mid right atrium.  If low SVC position is desired, retraction approximately 5 cm should be considered.   Original Report Authenticated By: Consuello Bossier, M.D.      Assessment / Plan: 1.  Status post ex lap with sigmoid colectomy and colostomy for perforated diverticulitis  - IV Cipro, Flagyl, Diflucan ongoing  - NPO, IV hydration  - wound care, stoma care 2.  Cardiac arrhythmias  - on amiodarone  - pressor support still needed 3.  Anemia  - follow Hgb and transfuse as needed  - WBC up due to Neulastin administration 4.  Hyponatremia  - NS IVF's  - monitor  Velora Heckler, MD, [facility] Office: [phone]  11/24/2011

## 2011-11-24 NOTE — Progress Notes (Deleted)
Noted a change in her heart rhythm;junctional alternating with sinus. Please refer to strips on chart and 12lead EKGs with both NSR and Junctional. Aminodarone infusion decreased to 30mg/hr. Weaver, PA for [place], notified.  He ordered to stop the Aminodarone infusion if the heart rate reaches 50. Dr. Alva, CCM also informed.     

## 2011-11-24 NOTE — Progress Notes (Signed)
Heart rhythm stable since midnight. Able to slowly wean Neosynephrine infusion down to 50mcg/min. Vasopression is continued with Neosynephrine per clarification with Dr. Vassie Loll, CCM earlier in shift. Aminodarone infusion is 30mg /hr. She is not able to focus, make eye contact, or follow commands. Tremors noted with stimulation.

## 2011-11-24 NOTE — Progress Notes (Signed)
Arterial line was noted upon assessment. LDA added in EPIC

## 2011-11-24 NOTE — Progress Notes (Signed)
  Echocardiogram 2D Echocardiogram has been performed.  [name] FRANCES 11/24/2011, 8:52 AM

## 2011-11-24 NOTE — Progress Notes (Signed)
PROGRESS NOTE  Subjective:   [name] is a 78 yo admitted with perforated sigmoid diverticulum complicated by peritonitis, pelvic abscess, and rapid atrial fibrillation.  She has converted to sinus rhythm overnight.  She had some junctional rhythm through the night.   Objective:    Vital Signs:   Temp:  [97.4 F (36.3 C)-97.9 F (36.6 C)] 97.9 F (36.6 C) (09/02 0800) Pulse Rate:  [59-157] 70  (09/02 0820) Resp:  [0-23] 23  (09/02 0820) BP: (56-143)/(33-77) 131/55 mmHg (09/02 0820) SpO2:  [94 %-100 %] 99 % (09/02 0852) Arterial Line BP: (51-142)/(32-70) 134/58 mmHg (09/01 2015) FiO2 (%):  [30 %] 30 % (09/02 0852) Weight:  [148 lb 9.4 oz (67.4 kg)] 148 lb 9.4 oz (67.4 kg) (09/02 0300)      24-hour weight change: Weight change:   Weight trends: Filed Weights   December 10, 2011 0040 11/24/11 0300  Weight: 116 lb 10 oz (52.9 kg) 148 lb 9.4 oz (67.4 kg)    Intake/Output:  09/01 0701 - 09/02 0700 In: 4226.4 [I.V.:2501.4; NG/GT:30; IV Piggyback:1115] Out: 1425 [Urine:925; Emesis/NG output:500]     Physical Exam: BP 131/55  Pulse 70  Temp 97.9 F (36.6 C) (Axillary)  Resp 23  Ht 4\' 11"  (1.499 m)  Wt 148 lb 9.4 oz (67.4 kg)  BMI 30.01 kg/m2  SpO2 99%  General: Vital signs reviewed and noted. Intubated, sedated, not responsive to command  Head: Normocephalic, atraumatic.  Eyes: conjunctivae/corneas clear.  EOM's intact.   Throat: intubated  Neck: Supple. Normal carotids. No JVD  Lungs:  Vent.  Heart: Regular rate,  With normal  S1 S2. No murmurs, gallops or rubs. tachy  Abdomen:  Midline incision.    Extremities: .   Neurologic: A&O X3, CN II - XII are grossly intact. Motor strength is 5/5 in the all 4 extremities.  Psych: unresponsive    Labs: BMET:  Basename 11/24/11 0513 11/23/11 2349 11/23/11 2120 11/23/11 0415  NA 122* -- 121* --  K 3.8 -- 3.7 --  CL 94* -- 94* --  CO2 21 -- 20 --  GLUCOSE 127* -- 191* --  BUN 8 -- 6 --  CREATININE 0.46* -- 0.43* --    CALCIUM 6.6* -- 6.4* --  MG 1.9 2.0 -- --  PHOS 2.2* -- -- 1.7*    Liver function tests:  [facility] 11/23/11 0415 11/21/11 1909  AST 15 13  ALT 8 12  ALKPHOS 54 63  BILITOT 0.2* 0.6  PROT 3.2* 5.1*  ALBUMIN 1.1* 2.5*    Basename 11/21/11 1909  LIPASE 9*  AMYLASE --    CBC:  Basename 11/24/11 0513 11/23/11 0415  WBC 22.5* 11.7*  NEUTROABS -- --  HGB 8.1* 8.9*  HCT 23.2* 26.3*  MCV 87.5 89.2  PLT 132* 166    Cardiac Enzymes:  Basename 11/23/11 1611 December 10, 2011 0330  CKTOTAL -- --  CKMB -- --  TROPONINI <0.30 <0.30    Coagulation Studies:  Basename 11/24/11 0513 12-10-11 1258 Dec 10, 2011 0330  LABPROT 14.8 17.4* 15.2  INR 1.14 1.40 1.18    Other: No components found with this basename: POCBNP:3  Basename 12/10/11 1258  DDIMER 4.01*   No results found for this basename: HGBA1C in the last 72 hours No results found for this basename: CHOL,HDL,LDLCALC,TRIG,CHOLHDL in the last 72 hours No results found for this basename: TSH,T4TOTAL,FREET3,T3FREE,THYROIDAB in the last 72 hours No results found for this basename: VITAMINB12,FOLATE,FERRITIN,TIBC,IRON,RETICCTPCT in the last 72 hours   Tele:  Sinus rhythm  Medications:    Infusions:    . amiodarone (NEXTERONE PREMIX) 360 mg/200 mL dextrose 1 mg/min (11/23/11 1757)   Followed by  . amiodarone (NEXTERONE PREMIX) 360 mg/200 mL dextrose 0.5 mg/min (11/24/11 0700)  . fentaNYL infusion INTRAVENOUS 50 mcg/hr (11/24/11 0100)  . midazolam (VERSED) infusion 2 mg/hr (11/24/11 0500)  . norepinephrine (LEVOPHED) Adult infusion 1 mcg/min (11/23/11 1800)  . phenylephrine (NEO-SYNEPHRINE) Adult infusion 20 mcg/min (11/24/11 0702)  . vasopressin (PITRESSIN) infusion - *FOR SHOCK* 0.03 Units/min (11/23/11 1900)  . DISCONTD: sodium chloride    . DISCONTD: dextrose 5 % and 0.45 % NaCl with KCl 40 mEq/L 50 mL/hr at 11/23/11 1052  . DISCONTD: phenylephrine (NEO-SYNEPHRINE) Adult infusion 50 mcg/min (11/23/11 1635)  . DISCONTD:  phenylephrine (NEO-SYNEPHRINE) Adult infusion Stopped (11/22/11 1223)  . DISCONTD:  sodium bicarbonate infusion 1000 mL 50 mL/hr at 11/22/11 1955    Scheduled Medications:    . albuterol  4 puff Inhalation Q4H  . amiodarone (NEXTERONE PREMIX) 360 mg/200 mL dextrose      . amiodarone  150 mg Intravenous Once  . antiseptic oral rinse  15 mL Mouth Rinse QID  . chlorhexidine  15 mL Mouth Rinse BID  . ciprofloxacin  400 mg Intravenous BID  . digoxin  0.5 mg Intravenous Once  . enoxaparin (LOVENOX) injection  40 mg Subcutaneous Q24H  . famotidine (PEPCID) IV  20 mg Intravenous Q12H  . fentaNYL  100 mcg Intravenous Once  . fentaNYL  100 mcg Intravenous Once  . fluconazole (DIFLUCAN) IV  200 mg Intravenous Q24H  . hydrocortisone sod succinate (SOLU-CORTEF) injection  50 mg Intravenous Q6H  . insulin aspart  0-15 Units Subcutaneous Q4H  . magnesium sulfate 1 - 4 g bolus IVPB  4 g Intravenous Once  . metronidazole  500 mg Intravenous Q8H  . midazolam  2 mg Intravenous Once  . midazolam  2 mg Intravenous Once  . pantoprazole (PROTONIX) IV  40 mg Intravenous QHS  . sodium chloride  1,000 mL Intravenous Once  . sodium chloride  1,000 mL Intravenous Once  . sodium chloride  500 mL Intravenous Once  . sodium phosphate  Dextrose 5% IVPB  20 mmol Intravenous Once  . DISCONTD: sodium chloride  25 mL/kg Intravenous Once    Assessment/ Plan:    Septic shock (11/22/2011) Plans per PCCM  Atrial fibrillation (11/23/2011)  she has converted to sinus rhythm and is now on amiodarone drip.  Would continue amiodarone short term.  I suspect this A-Fib is due to the stress of the peritonitis / pelvic abscess.  Echo has been ordered but is not reported yet.  BP is stable.    Hypotension:  BP is now 140s .  I dont think she needs the pressors but I will leave that decision up to CCM.    Disposition:  Length of Stay: 3  Vesta Mixer, Montez Hageman., MD, [facility] 11/24/2011, 9:01 AM Office [phone] Pager  [phone]

## 2011-11-25 ENCOUNTER — Encounter (HOSPITAL_COMMUNITY): Payer: Self-pay | Admitting: Surgery

## 2011-11-25 ENCOUNTER — Inpatient Hospital Stay (HOSPITAL_COMMUNITY): Payer: Medicare Other

## 2011-11-25 DIAGNOSIS — I5021 Acute systolic (congestive) heart failure: Secondary | ICD-10-CM | POA: Diagnosis not present

## 2011-11-25 LAB — COMPREHENSIVE METABOLIC PANEL
Albumin: 1.2 g/dL — ABNORMAL LOW (ref 3.5–5.2)
BUN: 10 mg/dL (ref 6–23)
Calcium: 6.8 mg/dL — ABNORMAL LOW (ref 8.4–10.5)
Creatinine, Ser: 0.5 mg/dL (ref 0.50–1.10)
Total Bilirubin: 0.2 mg/dL — ABNORMAL LOW (ref 0.3–1.2)
Total Protein: 3.3 g/dL — ABNORMAL LOW (ref 6.0–8.3)

## 2011-11-25 LAB — CARBOXYHEMOGLOBIN
Carboxyhemoglobin: 0.8 % (ref 0.5–1.5)
O2 Saturation: 73.7 %
Total hemoglobin: 10.7 g/dL — ABNORMAL LOW (ref 12.0–16.0)

## 2011-11-25 LAB — BLOOD GAS, ARTERIAL
MECHVT: 350 mL
Patient temperature: 98.6
TCO2: 21.3 mmol/L (ref 0–100)
pH, Arterial: 7.444 (ref 7.350–7.450)

## 2011-11-25 LAB — CBC
HCT: 22.7 % — ABNORMAL LOW (ref 36.0–46.0)
MCV: 86.6 fL (ref 78.0–100.0)
Platelets: 121 10*3/uL — ABNORMAL LOW (ref 150–400)
RBC: 2.62 MIL/uL — ABNORMAL LOW (ref 3.87–5.11)
WBC: 20.3 10*3/uL — ABNORMAL HIGH (ref 4.0–10.5)

## 2011-11-25 LAB — GLUCOSE, CAPILLARY
Glucose-Capillary: 109 mg/dL — ABNORMAL HIGH (ref 70–99)
Glucose-Capillary: 79 mg/dL (ref 70–99)
Glucose-Capillary: 79 mg/dL (ref 70–99)

## 2011-11-25 MED ORDER — HYDROCORTISONE SOD SUCCINATE 100 MG IJ SOLR
50.0000 mg | Freq: Two times a day (BID) | INTRAMUSCULAR | Status: DC
  Administered 2011-11-25: 50 mg via INTRAVENOUS
  Filled 2011-11-25 (×2): qty 1

## 2011-11-25 MED ORDER — MIDAZOLAM HCL 5 MG/ML IJ SOLN
1.0000 mg | INTRAMUSCULAR | Status: DC | PRN
  Administered 2011-11-25 – 2011-11-27 (×11): 2 mg via INTRAVENOUS
  Administered 2011-11-27: 1 mg via INTRAVENOUS
  Administered 2011-11-27 (×4): 2 mg via INTRAVENOUS
  Filled 2011-11-25 (×17): qty 1

## 2011-11-25 MED ORDER — FENTANYL CITRATE 0.05 MG/ML IJ SOLN
25.0000 ug | INTRAMUSCULAR | Status: DC | PRN
  Administered 2011-11-25 – 2011-11-27 (×19): 50 ug via INTRAVENOUS
  Filled 2011-11-25 (×22): qty 2

## 2011-11-25 MED ORDER — FUROSEMIDE 10 MG/ML IJ SOLN
20.0000 mg | Freq: Once | INTRAMUSCULAR | Status: AC
  Administered 2011-11-25: 20 mg via INTRAVENOUS
  Filled 2011-11-25: qty 2

## 2011-11-25 NOTE — Progress Notes (Signed)
AF, vitals stable. Off pressors - just on amiodarone  Intubated Min FC abd soft, mild distension. Ostomy viable - no air in bag. No stool. Ostomy edematous Midline incision - no cellulitis. Packed. Fair amount serous drainage on bandage  Can d/c fluconazole Cont other IV abx - needs a total of 12 days of abx Wean steroids Vent per ccm Would off on TPN for another day or so Appreciate CCM/cards  Mary Sella. Andrey Campanile, MD, FACS General, Bariatric, & Minimally Invasive Surgery [facility], [place]

## 2011-11-25 NOTE — Op Note (Signed)
NAMENAVIA, LINDAHL NO.:  [id number]  MEDICAL RECORD NO.:  [id number]  LOCATION:                               FACILITY:  [facility]  PHYSICIAN:  Velora Heckler, MD      DATE OF BIRTH:  07-28-1931  DATE OF PROCEDURE:  11/22/2011                               OPERATIVE REPORT   PREOPERATIVE DIAGNOSIS:  Perforated sigmoid diverticulitis with diffuse peritonitis.  POSTOPERATIVE DIAGNOSIS:  Perforated sigmoid diverticulitis with diffuse peritonitis.  PROCEDURES: 1. Exploratory laparotomy. 2. Sigmoid colectomy. 3. Descending colostomy. 4. Drainage of pelvic abscess.  SURGEON:  Velora Heckler, MD, FACS  ASSISTANT:  Ardeth Sportsman, MD.  ANESTHESIA:  Jenelle Mages. Fortune, M.D.  ESTIMATED BLOOD LOSS:  80 cc.  PREPARATION:  ChloraPrep.  COMPLICATIONS:  None.  INDICATIONS:  The patient is a 78 year old white female, who presented to the emergency department with a 4-5-day history of abdominal pain. The patient is undergoing adjuvant chemotherapy for B-cell lymphoma. She is neutropenic.  CT scan of the abdomen revealed evidence of diverticular disease with perforation, free air, and probable intra- abdominal abscess.  The patient was admitted to the surgical service to the intensive care unit.  She was resuscitated with fluids.  Hypokalemia was treated.  The patient required pressor support for developing sepsis.  She was seen in consultation and managed with critical care medicine.  After some discussion with General surgery, Critical Care, Anesthesiology, and family members, decision was made to proceed to the operating room for laparotomy.  BODY OF REPORT:  Procedure was done in OR #1 at the [facility].  The patient was brought to the operating room, placed in supine position on the operating room table.  Following administration of general anesthesia, the patient was prepped and draped in the usual strict aseptic fashion.  After  ascertaining that an adequate level of anesthesia been achieved, a midline abdominal incision was made with a #10 blade.  Dissection was carried through subcutaneous tissues and hemostasis obtained with electrocautery.  Fascia was incised in the midline and the peritoneal cavity was entered.  There was a large volume of clear ascitic fluid.  This was evacuated.  There is an obvious acute inflammatory process in the sigmoid colon.  A Balfour retractor was placed for exposure.  The abdomen was explored.  Serous fluid was evacuated from the upper quadrants of the abdomen.  Nasogastric tube was properly positioned within the stomach.  The sigmoid colon was gently mobilized with blunt dissection.  A large abscess cavity in the left pelvis was opened and evacuated.  The small bowel was mobilized out of the pelvis and packed cephalad.  The sigmoid colon was dissected away from the adnexal structures with sharp dissection.  Vascular tributaries were ligated with 2-0 silk ties.  A point in the proximal sigmoid colon was selected and transected with a GIA stapler.  Mesentery was mobilized with the ligature used for hemostasis.  Dissection was carried distally into the pelvis and past the obvious inflammatory changes in the wall of the sigmoid colon.  At the proximal rectum, the mesentery was cleared from the posterior wall with the ligature.  The  proximal rectum was then transected with a contour stapler.  The specimen was marked with a suture at the proximal end and submitted to pathology for review. Pelvis was irrigated copiously with warm saline, which was evacuated. Good hemostasis was noted.  An ellipse of skin was excised off the left abdominal wall.  A plug of adipose tissue was excised down to the fascia.  A cruciate incision was made into the abdominal wall fascia and the peritoneal cavity was entered cautiously.  Using a Babcock clamp, the proximal sigmoid colon was delivered through the  abdominal wall to be matured into a colostomy. The bowel was secured to the anterior fascia with interrupted 3-0 silk sutures.  Abdomen was copiously irrigated with warm saline, which was evacuated. Omentum was used to cover the small bowel.  Midline incision was closed with interrupted #1 Novafil simple sutures.  The skin was loosely approximated with stainless steel staples, and the subcutaneous tissues were packed with Betadine-soaked Kerlix gauze sponges.  The staple line was excised from the proximal sigmoid end.  The bowel was then matured to the skin edges circumferentially with interrupted 3- 0 Vicryl sutures.  Wounds were washed and dried, and sterile dressings were applied.  Ostomy appliance was applied to the stoma site.  The patient was transported directly from the operating room to the intensive care unit on the ventilator under the direction of anesthesiology.  The patient tolerated the procedure without further complications.   Velora Heckler, MD, [facility] Office: [phone]    TMG/MEDQ  D:  11/22/2011  T:  11/23/2011  Job:  [id number]

## 2011-11-25 NOTE — Progress Notes (Signed)
Name: [name] MRN: [id number] DOB: 1932/01/24    LOS: 4  Referring Provider:  Dr. Gerrit Friends  Reason for Referral:  Septic shock  PULMONARY / CRITICAL CARE MEDICINE  HPI:  78 yo female admitted 11/21/2011 5 days of abdominal pain.  Found to have perforated sigmoid diverticulum with peritonitis, and pelvic abscess.  Had laparotomy 8/31.  Developed respiratory failure and shock post-op.  PCCM consulted 8/31.  She has hx of B cell lymphoma currently on chemo as outpt.  Events Since Admission: 8/31 Laparotomy, sigmoid colectomy and ostomy 9/01 A fib with RVR 9/02 Off pressors  Subjective:  Back in sinus rhythm.  Weaning off pressors.  Oxygen needs improved.  Vital Signs: Temp:  [96.3 F (35.7 C)-97.6 F (36.4 C)] 97 F (36.1 C) (09/03 0800) Pulse Rate:  [59-90] 90  (09/03 0800) Resp:  [13-22] 17  (09/03 0800) BP: (101-123)/(44-76) 123/76 mmHg (09/03 0740) SpO2:  [77 %-100 %] 98 % (09/03 0800) FiO2 (%):  [30 %] 30 % (09/03 0740) Weight:  [152 lb 1.9 oz (69 kg)] 152 lb 1.9 oz (69 kg) (09/03 0400)  Intake/Output Summary (Last 24 hours) at 11/25/11 0950 Last data filed at 11/25/11 0900  Gross per 24 hour  Intake 2290.92 ml  Output   1925 ml  Net 365.92 ml   Physical Examination: General: Ill appearing Neuro: Sedated HEENT:  ETT in place Neck:  Supple Cardiovascular: s1s2 regular, no murmur Lungs: decreased breath sounds, no wheeze Abdomen:  Wound dressings clean Musculoskeletal:  No edema Skin:  No rashes.    Dg Chest Port 1 View  11/25/2011  *RADIOLOGY REPORT*  Clinical Data: Follow up pleural effusions  PORTABLE CHEST - 1 VIEW  Comparison: Portable exam 0450 hours compared to 11/24/2011  Findings: Tip of endotracheal tube 10 mm from carina. Right side Port-A-Cath, tip projecting over SVC near cavoatrial junction. Left subclavian central venous catheter, tip projecting over right atrium. Nasogastric tube extends into stomach. Rotated to the left. Stable heart size and  mediastinal contours. Left lower lobe atelectasis versus consolidation. Layered pleural effusions bilaterally. No pneumothorax.  IMPRESSION: Persistent pleural effusions and left lower lobe atelectasis versus consolidation. Tip of endotracheal tube 10 mm from carina. Tip of the left subclavian central venous catheter projects over the right atrium; recommend withdrawal 4 cm for positioning at the cavoatrial junction.   Original Report Authenticated By: Lollie Marrow, M.D.    Dg Chest Port 1 View  11/24/2011  *RADIOLOGY REPORT*  Clinical Data: Respiratory failure.  Bowel perforation and status post partial colectomy.  PORTABLE CHEST - 1 VIEW  Comparison: 11/23/2011  Findings: Endotracheal tube projects just above the carina.  There remains relatively stable atelectasis involving both lower lobes. There may be components of bilateral pleural effusions.  No pulmonary edema identified.  IMPRESSION: Persistent atelectasis of both lower lobes and possible bilateral pleural effusions.   Original Report Authenticated By: Reola Calkins, M.D.      ASSESSMENT AND PLAN  PULMONARY  Lab 11/25/11 0445 11/24/11 4098 11/23/11 1613 11/23/11 0355 11/22/11 1730 11/22/11 1648  PHART 7.444 7.377 7.367 7.374 -- 7.340*  PCO2ART 33.5* 34.1* 34.4* 29.3* -- 30.6*  PO2ART 89.1 80.5 69.2* 107.0* -- 114.0*  HCO3 22.7 19.8* 19.3* 16.9* -- 16.1*  O2SAT 98.7 97.8 94.9 99.3 73.7 --   Ventilator Settings: Vent Mode:  [-] PRVC FiO2 (%):  [30 %] 30 % Set Rate:  [18 bmp] 18 bmp Vt Set:  [350 mL] 350 mL PEEP:  [5 cmH20]  5 cmH20 Plateau Pressure:  [12 cmH20-14 cmH20] 12 cmH20  ETT:  8/31 >>   A: Acute respiratory failure in setting of septic shock from peritonitis. P:   F/u CXR Pressure support wean as tolerated>>not ready for extubation yet Keep in negative fluid balance as tolerated No hx of obstructive lung disease or smoking>>changed BD's to prn  CARDIOVASCULAR  Lab 11/23/11 1611 11/23/11 0415 11/22/11 1259  11/22/11 0330  TROPONINI <0.30 -- -- <0.30  LATICACIDVEN -- 5.4* 5.8* 4.7*  PROBNP -- -- -- --   Lines:  [place] CVL 8/31 >>   Echo 9/01>>periapical akinesis, EF 25 to 30%, diffuse hypokinesis, grade 1 diastolic dysfx, mod AR, mild MR, mod TR, mild/mod RV systolic dysfx, PAS 37 mmHg  A: Septic shock 2nd to peritonitis>>resolved. A fib with RVR>>back in sinus 9/02. Acute systolic CHF>>?if this is sepsis related cardiomyopathy. P:  Negative fluid balance to keep CVP < 10 Wean off solucortef Amiodarone gtt per cardiology  RENAL  Lab 11/25/11 0417 11/24/11 0513 11/23/11 2349 11/23/11 2120 11/23/11 1611 11/23/11 0415 11/22/11 1743  NA 126* 122* -- 121* 124* 125* --  K 3.9 3.8 -- -- -- -- --  CL 97 94* -- 94* 96 94* --  CO2 23 21 -- 20 19 18* --  BUN 10 8 -- 6 6 5* --  CREATININE 0.50 0.46* -- 0.43* 0.44* 0.45* --  CALCIUM 6.8* 6.6* -- 6.4* 6.0* 6.6* --  MG -- 1.9 2.0 -- -- 1.2* 0.6*  PHOS -- 2.2* -- -- -- 1.7* --   Intake/Output      09/02 0701 - 09/03 0700 09/03 0701 - 09/04 0700   I.V. (mL/kg) 1544.6 (22.4) 116.7 (1.7)   Other 80    NG/GT     IV Piggyback 710    Total Intake(mL/kg) 2334.6 (33.8) 116.7 (1.7)   Urine (mL/kg/hr) 1475 (0.9) 350   Emesis/NG output 130    Total Output 1605 350   Net +729.6 -233.3         Foley:  Placed 11/21/2011  A: Hyponatremia with volume overload. P:   KVO IV fluids Diurese as tolerated F/u BMET Monitor renal fx, urine outpt  GASTROINTESTINAL  Lab 11/25/11 0417 11/23/11 0415 11/21/11 1909 11/20/11 1055  AST 11 15 13 13   ALT 8 8 12 14   ALKPHOS 104 54 63 97  BILITOT 0.2* 0.2* 0.6 0.90  PROT 3.3* 3.2* 5.1* 5.4*  ALBUMIN 1.2* 1.1* 2.5* 2.8*    A:  Perforated sigmoid diverticulum s/p laparotomy. Nutrition. P:   Post-op care per CCS ?if she may need TNA>>defer decision to CCS  HEMATOLOGIC  Lab 11/25/11 0417 11/24/11 0513 11/23/11 0415 11/22/11 1258 11/22/11 1254 11/22/11 0330  HGB 8.0* 8.1* 8.9* -- 8.8* 9.7*  HCT 22.7*  23.2* 26.3* -- 25.8* 29.6*  PLT 121* 132* 166 178 178 --  INR -- 1.14 -- 1.40 -- 1.18  APTT -- -- -- 31 -- 33   A:  Follicular B cell lymphoma, currently undergoing chemotherapy. Anemia of critical illness, and chronic disease. Thrombocytopenia>>likely related to sepsis/critical illness P:  F/u CBC Transfuse for Hb < 7  INFECTIOUS  Lab 11/25/11 0417 11/24/11 0513 11/23/11 0415 11/22/11 1254 11/22/11 0330  WBC 20.3* 22.5* 11.7* 1.6* 2.0*  PROCALCITON -- -- 2.35 -- 4.18   Cultures: Blood 8/31 >> Urine 8/31 >>negative Peritoneal abscess 8/31 >>   Antibiotics: - Cipro 8/31 >>  - Flagyl 8/31 >>  - fluconazole 8/31 >>   A:  Peritonitis  2nd to sigmoid perforation. P:   D4/x cipro, flagyl, diflucan  ENDOCRINE  Lab 11/25/11 0751 11/25/11 0349 11/24/11 2351 11/24/11 2104 11/24/11 1615  GLUCAP 77 79 109* 78 103*   A:  Hyperglycemia. P:   SSI  NEUROLOGIC  A:  Sedation. P:   Limit sedation while weaning vent>>change to intermittent sedation protocol  BEST PRACTICE / DISPOSITION Level of Care:  ICU Primary Service:  Surgery Consultants: PCCM, [place] Cards, Oncology Code Status:  Limited>>no CPR, no defibrillation Diet:  N.p.o. DVT Px:  Lovenox GI Px:  Protonix Skin Integrity:  Good Social / Family: No family at bedside  CC time 45 minutes  Coralyn Helling, MD [facility] Pulmonary/Critical Care 11/25/2011, 9:50 AM Pager:  [phone] After 3pm call: [phone]

## 2011-11-25 NOTE — Progress Notes (Signed)
PROGRESS NOTE  Subjective:   [name] is a 78 yo admitted with perforated sigmoid diverticulum complicated by peritonitis, pelvic abscess, and rapid atrial fibrillation.  She has converted to sinus rhythm.  She is now in sinus rhythm and is on amiodarone drip  Objective:    Vital Signs:   Temp:  [96.3 F (35.7 C)-97.6 F (36.4 C)] 96.5 F (35.8 C) (09/03 0400) Pulse Rate:  [59-86] 79  (09/03 0740) Resp:  [13-23] 17  (09/03 0600) BP: (101-131)/(44-76) 123/76 mmHg (09/03 0740) SpO2:  [77 %-100 %] 99 % (09/03 0740) FiO2 (%):  [30 %] 30 % (09/03 0740) Weight:  [152 lb 1.9 oz (69 kg)] 152 lb 1.9 oz (69 kg) (09/03 0400)      24-hour weight change: Weight change: 3 lb 8.4 oz (1.6 kg)  Weight trends: Filed Weights   2011/12/16 0040 11/24/11 0300 11/25/11 0400  Weight: 116 lb 10 oz (52.9 kg) 148 lb 9.4 oz (67.4 kg) 152 lb 1.9 oz (69 kg)    Intake/Output:  09/02 0701 - 09/03 0700 In: 2328.6 [I.V.:1538.6; IV Piggyback:710] Out: 1605 [Urine:1475; Emesis/NG output:130]     Physical Exam: BP 123/76  Pulse 79  Temp 96.5 F (35.8 C) (Axillary)  Resp 17  Ht 4\' 11"  (1.499 m)  Wt 152 lb 1.9 oz (69 kg)  BMI 30.72 kg/m2  SpO2 99%  General: Vital signs reviewed and noted. Intubated, sedated, not responsive to command  Head: Normocephalic, atraumatic.  Eyes: conjunctivae/corneas clear.  EOM's intact.   Throat: intubated  Neck: Supple. Normal carotids. No JVD  Lungs:  Vent.  Heart: Regular rate,  With normal  S1 S2. No murmurs, gallops or rubs. tachy  Abdomen:  Midline incision.    Extremities: .   Neurologic: A&O X3, CN II - XII are grossly intact. Motor strength is 5/5 in the all 4 extremities.  Psych: unresponsive    Labs: BMET:  Basename 11/25/11 0417 11/24/11 0513 11/23/11 2349 11/23/11 0415  NA 126* 122* -- --  K 3.9 3.8 -- --  CL 97 94* -- --  CO2 23 21 -- --  GLUCOSE 80 127* -- --  BUN 10 8 -- --  CREATININE 0.50 0.46* -- --  CALCIUM 6.8* 6.6* -- --  MG --  1.9 2.0 --  PHOS -- 2.2* -- 1.7*    Liver function tests:  Basename 11/25/11 0417 11/23/11 0415  AST 11 15  ALT 8 8  ALKPHOS 104 54  BILITOT 0.2* 0.2*  PROT 3.3* 3.2*  ALBUMIN 1.2* 1.1*   No results found for this basename: LIPASE:2,AMYLASE:2 in the last 72 hours  CBC:  Basename 11/25/11 0417 11/24/11 0513  WBC 20.3* 22.5*  NEUTROABS -- --  HGB 8.0* 8.1*  HCT 22.7* 23.2*  MCV 86.6 87.5  PLT 121* 132*    Cardiac Enzymes:  Basename 11/23/11 1611  CKTOTAL --  CKMB --  TROPONINI <0.30    Coagulation Studies:  Basename 11/24/11 0513 Dec 16, 2011 1258  LABPROT 14.8 17.4*  INR 1.14 1.40    Other: No components found with this basename: POCBNP:3  Basename 12/16/11 1258  DDIMER 4.01*   No results found for this basename: HGBA1C in the last 72 hours No results found for this basename: CHOL,HDL,LDLCALC,TRIG,CHOLHDL in the last 72 hours  Basename 11/24/11 1257  TSH 0.252*  T4TOTAL --  T3FREE --  THYROIDAB --   No results found for this basename: VITAMINB12,FOLATE,FERRITIN,TIBC,IRON,RETICCTPCT in the last 72 hours   Tele:  Sinus rhythm  Medications:    Infusions:    . sodium chloride 50 mL/hr at 11/25/11 0404  . amiodarone (NEXTERONE PREMIX) 360 mg/200 mL dextrose 0.5 mg/min (11/25/11 0740)  . fentaNYL infusion INTRAVENOUS 50 mcg/hr (11/25/11 0000)  . midazolam (VERSED) infusion 1 mg/hr (11/24/11 1643)  . phenylephrine (NEO-SYNEPHRINE) Adult infusion Stopped (11/24/11 1610)  . vasopressin (PITRESSIN) infusion - *FOR SHOCK* Stopped (11/24/11 1642)  . DISCONTD: norepinephrine (LEVOPHED) Adult infusion 1 mcg/min (11/23/11 1800)    Scheduled Medications:    . antiseptic oral rinse  15 mL Mouth Rinse QID  . chlorhexidine  15 mL Mouth Rinse BID  . ciprofloxacin  400 mg Intravenous BID  . enoxaparin (LOVENOX) injection  40 mg Subcutaneous Q24H  . fluconazole (DIFLUCAN) IV  200 mg Intravenous Q24H  . hydrocortisone sod succinate (SOLU-CORTEF) injection  50  mg Intravenous Q6H  . insulin aspart  0-15 Units Subcutaneous Q4H  . metronidazole  500 mg Intravenous Q8H  . pantoprazole (PROTONIX) IV  40 mg Intravenous QHS  . vitamin A & D      . DISCONTD: albuterol  4 puff Inhalation Q4H  . DISCONTD: famotidine (PEPCID) IV  20 mg Intravenous Q12H  . DISCONTD: fentaNYL  100 mcg Intravenous Once  . DISCONTD: fentaNYL  100 mcg Intravenous Once  . DISCONTD: midazolam  2 mg Intravenous Once  . DISCONTD: midazolam  2 mg Intravenous Once  . DISCONTD: sodium chloride  1,000 mL Intravenous Once  . DISCONTD: sodium chloride  500 mL Intravenous Once    Assessment/ Plan:    Septic shock (11/22/2011) Plans per PCCM  Atrial fibrillation (11/23/2011)  she has converted to sinus rhythm and is now on amiodarone drip.  Would continue amiodarone short term.  I suspect this A-Fib is due to the stress of the peritonitis / pelvic abscess.  Echo shows severe LV dysfunction.  She has moderate AI, mild MR, moderate TR.    BP is stable.        Disposition:  Length of Stay: 4  Vesta Mixer, Montez Hageman., MD, [facility] 11/25/2011, 8:01 AM Office [phone] Pager [phone]

## 2011-11-25 NOTE — Progress Notes (Signed)
3 Days Post-Op  Subjective: Pt sedated on the Vent.  Sedation being weaned and they are going to do vent trials today.    Objective: Vital signs in last 24 hours: Temp:  [96.3 F (35.7 C)-97.9 F (36.6 C)] 96.5 F (35.8 C) (09/03 0400) Pulse Rate:  [57-86] 65  (09/03 0600) Resp:  [13-23] 17  (09/03 0600) BP: (101-131)/(44-55) 101/44 mmHg (09/02 2324) SpO2:  [77 %-100 %] 99 % (09/03 0600) FiO2 (%):  [30 %] 30 % (09/03 0600) Weight:  [69 kg (152 lb 1.9 oz)] 69 kg (152 lb 1.9 oz) (09/03 0400)  130 ml/NG.  No BM, Diet: NPO, CXR shows, pleural effusions, ET tube in place subclavian, afebrile, VSS, Na 126    Intake/Output from previous day: 09/02 0701 - 09/03 0700 In: 2328.6 [I.V.:1538.6; IV Piggyback:710] Out: 1605 [Urine:1475; Emesis/NG output:130] Intake/Output this shift:    General appearance: Sedated on Vent, but responds with dressing change.   Resp: clear to auscultation bilaterally and anterior exam, Cxr shows bilat effusion/atelectasis, GI: no bowel sounds, ostomy is pink there was just a fleck of stool at the end of the colostomy, nothing else, just some clear fluid in the ostomy bag, no gas.  Ostomy itselfs appears pink thru the bag.  Open portions of the wound with allot of serous drainage. Extremities: edema both upper and lower legs Cardiac:  Now in SR, with well controlled rate.  Lab Results:   [facility] 11/25/11 0417 11/24/11 0513  WBC 20.3* 22.5*  HGB 8.0* 8.1*  HCT 22.7* 23.2*  PLT 121* 132*    BMET  Basename 11/25/11 0417 11/24/11 0513  NA 126* 122*  K 3.9 3.8  CL 97 94*  CO2 23 21  GLUCOSE 80 127*  BUN 10 8  CREATININE 0.50 0.46*  CALCIUM 6.8* 6.6*   PT/INR  Basename 11/24/11 0513 11/22/11 1258  LABPROT 14.8 17.4*  INR 1.14 1.40     Lab 11/25/11 0417 11/23/11 0415 11/21/11 1909 11/20/11 1055  AST 11 15 13 13   ALT 8 8 12 14   ALKPHOS 104 54 63 97  BILITOT 0.2* 0.2* 0.6 0.90  PROT 3.3* 3.2* 5.1* 5.4*  ALBUMIN 1.2* 1.1* 2.5* 2.8*      Lipase     Component Value Date/Time   LIPASE 9* 11/21/2011 1909     Studies/Results: Dg Chest Port 1 View  11/25/2011  *RADIOLOGY REPORT*  Clinical Data: Follow up pleural effusions  PORTABLE CHEST - 1 VIEW  Comparison: Portable exam 0450 hours compared to 11/24/2011  Findings: Tip of endotracheal tube 10 mm from carina. Right side Port-A-Cath, tip projecting over SVC near cavoatrial junction. Left subclavian central venous catheter, tip projecting over right atrium. Nasogastric tube extends into stomach. Rotated to the left. Stable heart size and mediastinal contours. Left lower lobe atelectasis versus consolidation. Layered pleural effusions bilaterally. No pneumothorax.  IMPRESSION: Persistent pleural effusions and left lower lobe atelectasis versus consolidation. Tip of endotracheal tube 10 mm from carina. Tip of the left subclavian central venous catheter projects over the right atrium; recommend withdrawal 4 cm for positioning at the cavoatrial junction.   Original Report Authenticated By: Lollie Marrow, M.D.    Dg Chest Port 1 View  11/24/2011  *RADIOLOGY REPORT*  Clinical Data: Respiratory failure.  Bowel perforation and status post partial colectomy.  PORTABLE CHEST - 1 VIEW  Comparison: 11/23/2011  Findings: Endotracheal tube projects just above the carina.  There remains relatively stable atelectasis involving both lower lobes. There may be  components of bilateral pleural effusions.  No pulmonary edema identified.  IMPRESSION: Persistent atelectasis of both lower lobes and possible bilateral pleural effusions.   Original Report Authenticated By: Reola Calkins, M.D.     Medications:    . antiseptic oral rinse  15 mL Mouth Rinse QID  . chlorhexidine  15 mL Mouth Rinse BID  . ciprofloxacin  400 mg Intravenous BID  . enoxaparin (LOVENOX) injection  40 mg Subcutaneous Q24H  . fluconazole (DIFLUCAN) IV  200 mg Intravenous Q24H  . hydrocortisone sod succinate (SOLU-CORTEF) injection   50 mg Intravenous Q6H  . insulin aspart  0-15 Units Subcutaneous Q4H  . metronidazole  500 mg Intravenous Q8H  . pantoprazole (PROTONIX) IV  40 mg Intravenous QHS  . vitamin A & D      . DISCONTD: albuterol  4 puff Inhalation Q4H  . DISCONTD: famotidine (PEPCID) IV  20 mg Intravenous Q12H  . DISCONTD: fentaNYL  100 mcg Intravenous Once  . DISCONTD: fentaNYL  100 mcg Intravenous Once  . DISCONTD: midazolam  2 mg Intravenous Once  . DISCONTD: midazolam  2 mg Intravenous Once  . DISCONTD: sodium chloride  1,000 mL Intravenous Once  . DISCONTD: sodium chloride  500 mL Intravenous Once    Assessment/PlaN:  POD 3 perforated sigmoid diverticular disease with diffuse peritonitis and pelvic abscess,  S/p EXPLORATORY LAPAROTOMY , COLON RESECTION SIGMOID, COLOSTOMY, 11/22/2011 11/22/2011   Intra-abdominal sepsis due to ruptured sigmoid diverticulum Low grade B Cell, non hodgkin's lymphoma on R-CVP chemo with neutropenia Shock, on pressors  Post op ARDS Post op Hyponatremia,Hypophosphatemia, Hypomagnesemia, Hypocalcemia  Post op /atrial Fibrillation on Amiodarone drip Echo 11/23/11:  Periapical area akinetic, EF 25-30%, Moderate AR, Mild MR, Hx TIA Hx Hypertension Hx. arthritis  Hx of Esophageal strictures x 2  Plan: She is being weaned off Vent, off pressors yesterday, with good BP, Heart rate controlled, but poor EF, Probable fluid overload, with low Na, Sepsis, is stable, on Cipro, Flagyl, and diflucan.   Nothing of note thru NG, but nothing coming from the ostomy either.  No nutrition, at this point, and we should consider TNA, she is already hyponatremic,  Lovenox for chemical DVT prophylaxis.  I will discuss  TNA with Dr. Andrey Campanile.  Defer vent wean and fluid/electrolye management to CCM.  Afib/CM to Cardiology.      LOS: 4 days    [name] 11/25/2011

## 2011-11-26 ENCOUNTER — Inpatient Hospital Stay (HOSPITAL_COMMUNITY): Payer: Medicare Other

## 2011-11-26 DIAGNOSIS — I509 Heart failure, unspecified: Secondary | ICD-10-CM

## 2011-11-26 LAB — CBC
Hemoglobin: 8.3 g/dL — ABNORMAL LOW (ref 12.0–15.0)
MCH: 30.6 pg (ref 26.0–34.0)
MCHC: 35 g/dL (ref 30.0–36.0)
MCV: 87.5 fL (ref 78.0–100.0)

## 2011-11-26 LAB — BASIC METABOLIC PANEL
BUN: 11 mg/dL (ref 6–23)
CO2: 25 mEq/L (ref 19–32)
CO2: 26 mEq/L (ref 19–32)
Calcium: 7.1 mg/dL — ABNORMAL LOW (ref 8.4–10.5)
Calcium: 7.2 mg/dL — ABNORMAL LOW (ref 8.4–10.5)
Creatinine, Ser: 0.59 mg/dL (ref 0.50–1.10)
GFR calc non Af Amer: 86 mL/min — ABNORMAL LOW (ref >90)
GFR calc non Af Amer: 86 mL/min — ABNORMAL LOW (ref >90)
Glucose, Bld: 88 mg/dL (ref 70–99)
Potassium: 3.3 mEq/L — ABNORMAL LOW (ref 3.5–5.1)

## 2011-11-26 LAB — GLUCOSE, CAPILLARY
Glucose-Capillary: 128 mg/dL — ABNORMAL HIGH (ref 70–99)
Glucose-Capillary: 74 mg/dL (ref 70–99)
Glucose-Capillary: 75 mg/dL (ref 70–99)
Glucose-Capillary: 78 mg/dL (ref 70–99)
Glucose-Capillary: 89 mg/dL (ref 70–99)

## 2011-11-26 LAB — MAGNESIUM: Magnesium: 1.8 mg/dL (ref 1.5–2.5)

## 2011-11-26 MED ORDER — FAT EMULSION 20 % IV EMUL
250.0000 mL | INTRAVENOUS | Status: AC
  Administered 2011-11-26: 250 mL via INTRAVENOUS
  Filled 2011-11-26: qty 250

## 2011-11-26 MED ORDER — METOPROLOL TARTRATE 1 MG/ML IV SOLN
5.0000 mg | Freq: Four times a day (QID) | INTRAVENOUS | Status: DC
  Administered 2011-11-26 – 2011-11-27 (×6): 5 mg via INTRAVENOUS
  Filled 2011-11-26 (×13): qty 5

## 2011-11-26 MED ORDER — MIDAZOLAM HCL 5 MG/ML IJ SOLN
2.0000 mg | Freq: Once | INTRAMUSCULAR | Status: AC
  Administered 2011-11-26: 2 mg via INTRAVENOUS

## 2011-11-26 MED ORDER — ZINC TRACE METAL 1 MG/ML IV SOLN
INTRAVENOUS | Status: AC
  Administered 2011-11-26: 19:00:00 via INTRAVENOUS
  Filled 2011-11-26: qty 1000

## 2011-11-26 MED ORDER — FENTANYL CITRATE 0.05 MG/ML IJ SOLN
100.0000 ug | Freq: Once | INTRAMUSCULAR | Status: AC
  Administered 2011-11-26: 100 ug via INTRAVENOUS

## 2011-11-26 MED ORDER — FUROSEMIDE 10 MG/ML IJ SOLN
20.0000 mg | Freq: Once | INTRAMUSCULAR | Status: AC
  Administered 2011-11-26: 20 mg via INTRAVENOUS
  Filled 2011-11-26: qty 2

## 2011-11-26 MED ORDER — POTASSIUM CHLORIDE 10 MEQ/50ML IV SOLN
10.0000 meq | INTRAVENOUS | Status: AC
  Administered 2011-11-26 (×4): 10 meq via INTRAVENOUS
  Filled 2011-11-26: qty 150
  Filled 2011-11-26: qty 50

## 2011-11-26 MED ORDER — POTASSIUM CHLORIDE 10 MEQ/50ML IV SOLN
10.0000 meq | INTRAVENOUS | Status: AC
  Administered 2011-11-26 (×4): 10 meq via INTRAVENOUS
  Filled 2011-11-26 (×4): qty 50

## 2011-11-26 MED ORDER — KCL IN DEXTROSE-NACL 40-5-0.9 MEQ/L-%-% IV SOLN
INTRAVENOUS | Status: DC
  Administered 2011-11-26: 10:00:00 via INTRAVENOUS
  Filled 2011-11-26: qty 1000

## 2011-11-26 NOTE — Progress Notes (Signed)
4 Days Post-Op  Subjective: They are trying to wean vent again this AM.  She is a bit agitated, and they are keeping mit's on her when they are out of room.  Not really following directions at this point.  Objective: Vital signs in last 24 hours: Temp:  [97 F (36.1 C)-98.3 F (36.8 C)] 98.3 F (36.8 C) (09/04 0400) Pulse Rate:  [65-114] 106  (09/04 0718) Resp:  [10-18] 10  (09/04 0718) BP: (109-128)/(54-76) 128/54 mmHg (09/03 2357) SpO2:  [95 %-99 %] 97 % (09/04 0718) FiO2 (%):  [30 %] 30 % (09/04 0718) Weight:  [67.1 kg (147 lb 14.9 oz)] 67.1 kg (147 lb 14.9 oz) (09/04 0000)  I/O=  -2118 ml yesterday, but still 10 liters positive for hospitalization, NG not draining much at this point, nothing thru the ostomy.  Na is up to 137,  WBC is up to 26K, K+ 3.3 Afebrile, Tachycardic again,  CXR- consolidation left base, right effusion, intubated. Drips: Fentanyl            Amiodarone Intake/Output from previous day: 09/03 0701 - 09/04 0700 In: 1956.6 [I.V.:1106.6; IV Piggyback:850] Out: 4075 [Urine:3950; Emesis/NG output:125] Intake/Output this shift:    General appearance: Mildly agitated, not really following directions, still getting fentanyl bolus, off drip.   Resp: clear to auscultation bilaterally and decreased BS in bases. GI: No bowel sounds, nothing aside from some clear fluid in the ostomy bag.  No gas.  Open areas of the abdomen still draining serous fluid, maybe not as much this AM.  Dressing is pretty wet. Extremities: edema bilat, upper and lower extremities.  May be less than yesterday..  Lab Results:   Basename 11/26/11 0445 11/25/11 0417  WBC 26.4* 20.3*  HGB 8.3* 8.0*  HCT 23.7* 22.7*  PLT 172 121*    BMET  Basename 11/26/11 0445 11/25/11 0417  NA 137 126*  K 3.3* 3.9  CL 105 97  CO2 25 23  GLUCOSE 88 80  BUN 11 10  CREATININE 0.59 0.50  CALCIUM 7.1* 6.8*   PT/INR  Basename 11/24/11 0513  LABPROT 14.8  INR 1.14     Lab 11/25/11 0417 11/23/11  0415 11/21/11 1909 11/20/11 1055  AST 11 15 13 13   ALT 8 8 12 14   ALKPHOS 104 54 63 97  BILITOT 0.2* 0.2* 0.6 0.90  PROT 3.3* 3.2* 5.1* 5.4*  ALBUMIN 1.2* 1.1* 2.5* 2.8*     Lipase     Component Value Date/Time   LIPASE 9* 11/21/2011 1909     Studies/Results: Dg Chest Port 1 View  11/26/2011  *RADIOLOGY REPORT*  Clinical Data: Follow up pleural effusions.  PORTABLE CHEST - 1 VIEW  Comparison: 11/25/2011.  Findings:  Right central line and left central line are in place. Tips overlap each other at the level of the mid to distal superior vena cava.Exact position of individual catheter tips difficult to discern with certainty.  One of the catheter tip space at the level of the right atrium.  The appearance is without change.  No gross pneumothorax.  Endotracheal tube tip 2.6 cm above the carina.  Nasogastric tube courses below the diaphragm.  The tip is not included on this exam.  Consolidation left base may represent atelectasis with pleural effusion although underlying infiltrate or mass not excluded. Posteriorly layering right-sided pleural effusion may be present.  Pulmonary vascular congestion.  IMPRESSION: No significant change.  Please see above.   Original Report Authenticated By: Fuller Canada, M.D.  Dg Chest Port 1 View  11/25/2011  *RADIOLOGY REPORT*  Clinical Data: Follow up pleural effusions  PORTABLE CHEST - 1 VIEW  Comparison: Portable exam 0450 hours compared to 11/24/2011  Findings: Tip of endotracheal tube 10 mm from carina. Right side Port-A-Cath, tip projecting over SVC near cavoatrial junction. Left subclavian central venous catheter, tip projecting over right atrium. Nasogastric tube extends into stomach. Rotated to the left. Stable heart size and mediastinal contours. Left lower lobe atelectasis versus consolidation. Layered pleural effusions bilaterally. No pneumothorax.  IMPRESSION: Persistent pleural effusions and left lower lobe atelectasis versus consolidation. Tip of  endotracheal tube 10 mm from carina. Tip of the left subclavian central venous catheter projects over the right atrium; recommend withdrawal 4 cm for positioning at the cavoatrial junction.   Original Report Authenticated By: Lollie Marrow, M.D.     Medications:    . antiseptic oral rinse  15 mL Mouth Rinse QID  . chlorhexidine  15 mL Mouth Rinse BID  . ciprofloxacin  400 mg Intravenous BID  . enoxaparin (LOVENOX) injection  40 mg Subcutaneous Q24H  . fluconazole (DIFLUCAN) IV  200 mg Intravenous Q24H  . furosemide  20 mg Intravenous Once  . hydrocortisone sod succinate (SOLU-CORTEF) injection  50 mg Intravenous Q12H  . insulin aspart  0-15 Units Subcutaneous Q4H  . metronidazole  500 mg Intravenous Q8H  . pantoprazole (PROTONIX) IV  40 mg Intravenous QHS  . potassium chloride  10 mEq Intravenous Q1 Hr x 4  . DISCONTD: hydrocortisone sod succinate (SOLU-CORTEF) injection  50 mg Intravenous Q6H    Assessment/Plan Intra-abdominal sepsis due to ruptured sigmoid diverticulum  Low grade B Cell, non hodgkin's lymphoma on R-CVP chemo with neutropenia  Shock, on pressors  Post op ARDS  Post op Hyponatremia,Hypophosphatemia, Hypomagnesemia, Hypocalcemia (NA improved today) Post op /atrial Fibrillation on Amiodarone drip.  IN Sinus rhythm. Echo 11/23/11: Periapical area akinetic, EF 25-30%, Moderate AR, Mild MR,  Hx TIA  Hx Hypertension  Hx. arthritis  Hx of Esophageal strictures x 2  PLan:  Defer to CCM on Vent and fluid management.  Discussed TNA with DR. Sood and will go forward with this.  I will leave NG for now.     LOS: 5 days    Farron Watrous 11/26/2011

## 2011-11-26 NOTE — Progress Notes (Signed)
Nutrition Follow-up  Intervention:  TPN per pharmacy.  Goal is 60 ml/hr which will provide a daily average of 1472 kcal, 72 gm protein.  Assessment:   Peritonitis secondary to sigmoid perforation.  Remains intubated.  Pt to begin tpn.  Pt is at risk for refeeding syndrome due to weight loss.  TPN:  Clinimix 5/20 to begin at 40 ml/hr plus 20%lipids at 10 ml/hr to provide:  1051 kcal, 48 gm protein per daily average  Diet Order:  NPO  Meds: Scheduled Meds:   . antiseptic oral rinse  15 mL Mouth Rinse QID  . chlorhexidine  15 mL Mouth Rinse BID  . ciprofloxacin  400 mg Intravenous BID  . enoxaparin (LOVENOX) injection  40 mg Subcutaneous Q24H  . fluconazole (DIFLUCAN) IV  200 mg Intravenous Q24H  . furosemide  20 mg Intravenous Once  . furosemide  20 mg Intravenous Once  . insulin aspart  0-15 Units Subcutaneous Q4H  . metoprolol  5 mg Intravenous Q6H  . metronidazole  500 mg Intravenous Q8H  . pantoprazole (PROTONIX) IV  40 mg Intravenous QHS  . potassium chloride  10 mEq Intravenous Q1 Hr x 4  . DISCONTD: hydrocortisone sod succinate (SOLU-CORTEF) injection  50 mg Intravenous Q12H   Continuous Infusions:   . amiodarone (NEXTERONE PREMIX) 360 mg/200 mL dextrose 0.5 mg/min (11/26/11 0600)  . dextrose 5 % and 0.9 % NaCl with KCl 40 mEq/L 30 mL/hr at 11/26/11 0933  . DISCONTD: sodium chloride 20 mL/hr at 11/25/11 1136   PRN Meds:.albuterol, fentaNYL, midazolam, ondansetron  Labs:  CMP     Component Value Date/Time   NA 137 11/26/2011 0445   NA 134* 11/20/2011 1055   K 3.3* 11/26/2011 0445   K 2.9* 11/20/2011 1055   CL 105 11/26/2011 0445   CL 96* 11/20/2011 1055   CO2 25 11/26/2011 0445   CO2 26 11/20/2011 1055   GLUCOSE 88 11/26/2011 0445   GLUCOSE 167* 11/20/2011 1055   BUN 11 11/26/2011 0445   BUN 11.0 11/20/2011 1055   CREATININE 0.59 11/26/2011 0445   CREATININE 0.8 11/20/2011 1055   CALCIUM 7.1* 11/26/2011 0445   PROT 3.3* 11/25/2011 0417   PROT 5.4* 11/20/2011 1055   ALBUMIN 1.2*  11/25/2011 0417   ALBUMIN 2.8* 11/20/2011 1055   AST 11 11/25/2011 0417   AST 13 11/20/2011 1055   ALT 8 11/25/2011 0417   ALT 14 11/20/2011 1055   ALKPHOS 104 11/25/2011 0417   ALKPHOS 97 11/20/2011 1055   BILITOT 0.2* 11/25/2011 0417   BILITOT 0.90 11/20/2011 1055   GFRNONAA 86* 11/26/2011 0445   GFRAA >90 11/26/2011 0445     Intake/Output Summary (Last 24 hours) at 11/26/11 1018 Last data filed at 11/26/11 0800  Gross per 24 hour  Intake 1697.4 ml  Output   3275 ml  Net -1577.6 ml    Weight Status:  67.1 kg  Re-estimated needs:  1468 kcals, 80-90 gm protein, daily  Nutrition Dx:  Inadequate oral intake-ongoing  Goal:  TPN to meet 95% estimated needs  Monitor:  TPN with pharmacy, diet advancement, meds, labs, weight   Oran Rein, RD, LDN Clinical Inpatient Dietitian Pager:  [phone] Weekend and after hours pager:  [phone]

## 2011-11-26 NOTE — Progress Notes (Addendum)
Name: [name] MRN: [id number] DOB: [birth year]    LOS: 5  Referring Provider:  Dr. Gerrit Friends  Reason for Referral:  Septic shock  PULMONARY / CRITICAL CARE MEDICINE  HPI:  78 yo female admitted 11/21/2011 5 days of abdominal pain.  Found to have perforated sigmoid diverticulum with peritonitis, and pelvic abscess.  Had laparotomy 8/31.  Developed respiratory failure and shock post-op.  PCCM consulted 8/31.  She has hx of B cell lymphoma currently on chemo as outpt.  Events Since Admission: 8/31 Laparotomy, sigmoid colectomy and ostomy 9/01 A fib with RVR 9/02 Off pressors  Subjective:  Tolerating pressure support.  Vital Signs: Temp:  [97.3 F (36.3 C)-98.3 F (36.8 C)] 98.3 F (36.8 C) (09/04 0400) Pulse Rate:  [65-114] 114  (09/04 0800) Resp:  [10-18] 14  (09/04 0800) BP: (109-128)/(54) 128/54 mmHg (09/03 2357) SpO2:  [95 %-99 %] 98 % (09/04 0800) FiO2 (%):  [30 %] 30 % (09/04 0718) Weight:  [147 lb 14.9 oz (67.1 kg)] 147 lb 14.9 oz (67.1 kg) (09/04 0000)  Intake/Output Summary (Last 24 hours) at 11/26/11 0828 Last data filed at 11/26/11 0800  Gross per 24 hour  Intake 2030.8 ml  Output   3875 ml  Net -1844.2 ml   Physical Examination: General: Ill appearing Neuro: Sedated HEENT:  ETT in place Neck:  Supple Cardiovascular: s1s2 regular, no murmur Lungs: decreased breath sounds, no wheeze Abdomen:  Wound dressings clean Musculoskeletal:  No edema Skin:  No rashes.    Dg Chest Port 1 View  11/26/2011  *RADIOLOGY REPORT*  Clinical Data: Follow up pleural effusions.  PORTABLE CHEST - 1 VIEW  Comparison: 11/25/2011.  Findings:  Right central line and left central line are in place. Tips overlap each other at the level of the mid to distal superior vena cava.Exact position of individual catheter tips difficult to discern with certainty.  One of the catheter tip space at the level of the right atrium.  The appearance is without change.  No gross pneumothorax.   Endotracheal tube tip 2.6 cm above the carina.  Nasogastric tube courses below the diaphragm.  The tip is not included on this exam.  Consolidation left base may represent atelectasis with pleural effusion although underlying infiltrate or mass not excluded. Posteriorly layering right-sided pleural effusion may be present.  Pulmonary vascular congestion.  IMPRESSION: No significant change.  Please see above.   Original Report Authenticated By: Fuller Canada, M.D.    Dg Chest Port 1 View  11/25/2011  *RADIOLOGY REPORT*  Clinical Data: Follow up pleural effusions  PORTABLE CHEST - 1 VIEW  Comparison: Portable exam 0450 hours compared to 11/24/2011  Findings: Tip of endotracheal tube 10 mm from carina. Right side Port-A-Cath, tip projecting over SVC near cavoatrial junction. Left subclavian central venous catheter, tip projecting over right atrium. Nasogastric tube extends into stomach. Rotated to the left. Stable heart size and mediastinal contours. Left lower lobe atelectasis versus consolidation. Layered pleural effusions bilaterally. No pneumothorax.  IMPRESSION: Persistent pleural effusions and left lower lobe atelectasis versus consolidation. Tip of endotracheal tube 10 mm from carina. Tip of the left subclavian central venous catheter projects over the right atrium; recommend withdrawal 4 cm for positioning at the cavoatrial junction.   Original Report Authenticated By: Lollie Marrow, M.D.      ASSESSMENT AND PLAN  PULMONARY  Lab 11/25/11 0445 11/24/11 4098 11/23/11 1613 11/23/11 0355 11/22/11 1730 11/22/11 1648  PHART 7.444 7.377 7.367 7.374 -- 7.340*  PCO2ART 33.5* 34.1* 34.4* 29.3* -- 30.6*  PO2ART 89.1 80.5 69.2* 107.0* -- 114.0*  HCO3 22.7 19.8* 19.3* 16.9* -- 16.1*  O2SAT 98.7 97.8 94.9 99.3 73.7 --   Ventilator Settings: Vent Mode:  [-] PSV FiO2 (%):  [30 %] 30 % Set Rate:  [12 bmp] 12 bmp Vt Set:  [350 mL] 350 mL PEEP:  [5 cmH20] 5 cmH20 Pressure Support:  [10 cmH20] 10  cmH20 Plateau Pressure:  [11 cmH20-14 cmH20] 11 cmH20  ETT:  8/31 >>   A: Acute respiratory failure in setting of septic shock from peritonitis.  Respiratory mechanics improved with diuresis. P:   F/u CXR Pressure support wean as tolerated>>likely can extubate soon once mental status improved. Keep in negative fluid balance as tolerated No hx of obstructive lung disease or smoking>>changed BD's to prn  CARDIOVASCULAR  Lab 11/23/11 1611 11/23/11 0415 11/22/11 1259 11/22/11 0330  TROPONINI <0.30 -- -- <0.30  LATICACIDVEN -- 5.4* 5.8* 4.7*  PROBNP -- -- -- --   Lines:  [place] CVL 8/31 >>   Echo 9/01>>periapical akinesis, EF 25 to 30%, diffuse hypokinesis, grade 1 diastolic dysfx, mod AR, mild MR, mod TR, mild/mod RV systolic dysfx, PAS 37 mmHg  A: Septic shock 2nd to peritonitis>>resolved. A fib with RVR>>back in sinus 9/02. Acute systolic CHF>>?if this is sepsis related cardiomyopathy. P:  Negative fluid balance to keep CVP < 10 D/c solucortef Amiodarone gtt per cardiology  RENAL  Lab 11/26/11 0445 11/25/11 0417 11/24/11 0513 11/23/11 2349 11/23/11 2120 11/23/11 1611 11/23/11 0415 11/22/11 1743  NA 137 126* 122* -- 121* 124* -- --  K 3.3* 3.9 -- -- -- -- -- --  CL 105 97 94* -- 94* 96 -- --  CO2 25 23 21  -- 20 19 -- --  BUN 11 10 8  -- 6 6 -- --  CREATININE 0.59 0.50 0.46* -- 0.43* 0.44* -- --  CALCIUM 7.1* 6.8* 6.6* -- 6.4* 6.0* -- --  MG -- -- 1.9 2.0 -- -- 1.2* 0.6*  PHOS -- -- 2.2* -- -- -- 1.7* --   Intake/Output      09/03 0701 - 09/04 0700 09/04 0701 - 09/05 0700   I.V. (mL/kg) 1163.3 (17.3) 36.7 (0.5)   Other     IV Piggyback 850 50   Total Intake(mL/kg) 2013.3 (30) 86.7 (1.3)   Urine (mL/kg/hr) 3950 (2.5) 150   Emesis/NG output 125    Total Output 4075 150   Net -2061.7 -63.3         Foley:  Placed 11/21/2011  A: Hyponatremia with volume overload>>improved with diuresis. Hypokalemia. P:   Change IV fluids to D5NS with 40 meq KCL at 30 ml/hr Diurese  as tolerated F/u BMET, electrolytes Monitor renal fx, urine outpt  GASTROINTESTINAL  Lab 11/25/11 0417 11/23/11 0415 11/21/11 1909 11/20/11 1055  AST 11 15 13 13   ALT 8 8 12 14   ALKPHOS 104 54 63 97  BILITOT 0.2* 0.2* 0.6 0.90  PROT 3.3* 3.2* 5.1* 5.4*  ALBUMIN 1.2* 1.1* 2.5* 2.8*    A:  Perforated sigmoid diverticulum s/p laparotomy. Nutrition. P:   Post-op care per [facility] ?if she may need TNA>>defer decision to [facility]  HEMATOLOGIC  Lab 11/26/11 0445 11/25/11 0417 11/24/11 0513 11/23/11 0415 11/22/11 1258 11/22/11 1254 11/22/11 0330  HGB 8.3* 8.0* 8.1* 8.9* -- 8.8* --  HCT 23.7* 22.7* 23.2* 26.3* -- 25.8* --  PLT 172 121* 132* 166 178 -- --  INR -- -- 1.14 -- 1.40 --  1.18  APTT -- -- -- -- 31 -- 33   A:  Follicular B cell lymphoma, currently undergoing chemotherapy. Anemia of critical illness, and chronic disease. Thrombocytopenia>>likely related to sepsis/critical illness P:  F/u CBC Transfuse for Hb < 7  INFECTIOUS  Lab 11/26/11 0445 11/25/11 0417 11/24/11 0513 11/23/11 0415 11/22/11 1254 11/22/11 0330  WBC 26.4* 20.3* 22.5* 11.7* 1.6* --  PROCALCITON -- -- -- 2.35 -- 4.18   Cultures: Blood 8/31 >> Urine 8/31 >>negative Peritoneal abscess 8/31 >>   Antibiotics: - Cipro 8/31 >>  - Flagyl 8/31 >>  - fluconazole 8/31 >>   A:  Peritonitis 2nd to sigmoid perforation.  Increased WBC 9/04 likely from hemoconcentration with diuresis. P:   D5/x cipro, flagyl, diflucan  ENDOCRINE  Lab 11/26/11 0359 11/26/11 0010 11/25/11 1910 11/25/11 1554 11/25/11 1204  GLUCAP 78 89 79 75 84   A:  Hyperglycemia. P:   SSI  NEUROLOGIC  A:  Sedation. P:   Limit sedation while weaning vent>>change to intermittent sedation protocol  BEST PRACTICE / DISPOSITION Level of Care:  ICU Primary Service:  Surgery Consultants: PCCM, [place] Cards, Oncology Code Status:  Limited>>no CPR, no defibrillation Diet:  N.p.o. DVT Px:  Lovenox GI Px:  Protonix Skin Integrity:   Good Social / Family: Updated family 9/03  CC time 35 minutes  Coralyn Helling, MD [facility] Pulmonary/Critical Care 11/26/2011, 8:28 AM Pager:  [phone] After 3pm call: [phone]

## 2011-11-26 NOTE — Progress Notes (Addendum)
PARENTERAL NUTRITION CONSULT NOTE - INITIAL  Pharmacy Consult for TPN Indication: Prolonged ileus  Allergies  Allergen Reactions  . Codeine Hives  . Other Nausea And Vomiting    anesthesia  . Penicillins Hives  . Sulfa Antibiotics Hives    Patient Measurements: Height: 4\' 11"  (149.9 cm) Weight: 147 lb 14.9 oz (67.1 kg) IBW/kg (Calculated) : 43.2  Adjusted Body Weight: 50.4 Usual Weight:   Vital Signs: Temp: 98.9 F (37.2 C) (09/04 0800) Temp src: Axillary (09/04 0800) BP: 128/54 mmHg (09/03 2357) Pulse Rate: 114  (09/04 0800) Intake/Output from previous day: 09/03 0701 - 09/04 0700 In: 2013.3 [I.V.:1163.3; IV Piggyback:850] Out: 4075 [Urine:3950; Emesis/NG output:125]  Labs:  [facility] 11/26/11 0445 11/25/11 0417 11/24/11 0513  WBC 26.4* 20.3* 22.5*  HGB 8.3* 8.0* 8.1*  HCT 23.7* 22.7* 23.2*  PLT 172 121* 132*  APTT -- -- --  INR -- -- 1.14     Basename 11/26/11 0445 11/25/11 0417 11/24/11 0513 11/23/11 2349  NA 137 126* 122* --  K 3.3* 3.9 3.8 --  CL 105 97 94* --  CO2 25 23 21  --  GLUCOSE 88 80 127* --  BUN 11 10 8  --  CREATININE 0.59 0.50 0.46* --  LABCREA -- -- -- --  CREAT24HRUR -- -- -- --  CALCIUM 7.1* 6.8* 6.6* --  MG -- -- 1.9 2.0  PHOS -- -- 2.2* --  PROT -- 3.3* -- --  ALBUMIN -- 1.2* -- --  AST -- 11 -- --  ALT -- 8 -- --  ALKPHOS -- 104 -- --  BILITOT -- 0.2* -- --  BILIDIR -- -- -- --  IBILI -- -- -- --  PREALBUMIN -- -- -- --  TRIG -- -- -- --  CHOLHDL -- -- -- --  CHOL -- -- -- --  9/4 Corrected Ca: 9.34 Estimated Creatinine Clearance: 49.1 ml/min (by C-G formula based on Cr of 0.59).    Basename 11/26/11 0744 11/26/11 0359 11/26/11 0010  GLUCAP 74 78 89    Medical History: Past Medical History  Diagnosis Date  . Cancer 07/08/11    follicular B cell lymphoma  . TIA (transient ischemic attack)   . Arthritis   . Hypertension   . Esophageal abnormality     strictures  . Arthritis 09/04/2011  . Dehydration 11/20/2011  .  Diarrhea 11/20/2011  . Complication of anesthesia   . PONV (postoperative nausea and vomiting)     in the 1980's  . GERD (gastroesophageal reflux disease)   . Esophageal stricture     x 2    Medications:  Scheduled:    . antiseptic oral rinse  15 mL Mouth Rinse QID  . chlorhexidine  15 mL Mouth Rinse BID  . ciprofloxacin  400 mg Intravenous BID  . enoxaparin (LOVENOX) injection  40 mg Subcutaneous Q24H  . fluconazole (DIFLUCAN) IV  200 mg Intravenous Q24H  . furosemide  20 mg Intravenous Once  . furosemide  20 mg Intravenous Once  . insulin aspart  0-15 Units Subcutaneous Q4H  . metoprolol  5 mg Intravenous Q6H  . metronidazole  500 mg Intravenous Q8H  . pantoprazole (PROTONIX) IV  40 mg Intravenous QHS  . potassium chloride  10 mEq Intravenous Q1 Hr x 4  . DISCONTD: hydrocortisone sod succinate (SOLU-CORTEF) injection  50 mg Intravenous Q6H  . DISCONTD: hydrocortisone sod succinate (SOLU-CORTEF) injection  50 mg Intravenous Q12H   Infusions:    . amiodarone (NEXTERONE PREMIX) 360 mg/200 mL  dextrose 0.5 mg/min (11/26/11 0600)  . dextrose 5 % and 0.9 % NaCl with KCl 40 mEq/L 30 mL/hr at 11/26/11 0933  . DISCONTD: sodium chloride 20 mL/hr at 11/25/11 1136  . DISCONTD: fentaNYL infusion INTRAVENOUS 50 mcg/hr (11/25/11 0000)  . DISCONTD: midazolam (VERSED) infusion 1 mg/hr (11/24/11 1643)  . DISCONTD: phenylephrine (NEO-SYNEPHRINE) Adult infusion Stopped (11/24/11 1610)  . DISCONTD: vasopressin (PITRESSIN) infusion - *FOR SHOCK* Stopped (11/24/11 1642)    Nutritional Goals:   RD recs:  1468 kcals, 80-90 gm protein, daily  Clinimix 5/20 goal rate of 60 ml/hr (+ Lipids MWF) to provide: 72 g/day protein and an average of 1472 Kcal/day (1746 Kcal/day MWF, 1267 Kcal/day STTHS).  Current nutrition:   Diet: NPO  IVF: D5NS +40KCl at 30 ml/hr  CBGs & Insulin requirements past 24 hours:   CBGs: 74-89  Sensitive SSI Q4h:  None given  Assessment:   78 yo F admit on 8/30 w/  ruptured sigmoid diverticulum, peritonitis, and pelvic abscess. S/p laparotomy 8/31.  Fluid overload is a concern - furosemide 20mg  to continue diuresis again today.  Previous hyponatremia is improved today.  High risk of refeeding syndrome  Renal/hepatic function:  WNL  Electrolytes: K low (KCl IV today); Corrected Ca, Mag and Phos WNL  Pre-Albumin: pending  TG/Cholesterol: pending  GI prophylaxis:  pantoprazole  Plan:  At 1800 tonight Clinimix E 5/20 at 40 ml/hr Fat emulsion at 10 ml/hr (MWF only due to ongoing shortage). TNA to contain standard multivitamins and trace elements (MWF only due to ongoing shortage). Reduce IVF to [facility]  Continue Q4h CBGs and sensitive SSI as ordered TNA lab panels on Mondays & Thursdays. F/u daily.   Lynann Beaver PharmD, BCPS Pager [phone] 11/26/2011 10:18 AM

## 2011-11-26 NOTE — Progress Notes (Signed)
PROGRESS NOTE  Subjective:   [name] is a 78 yo admitted with perforated sigmoid diverticulum complicated by peritonitis, pelvic abscess, and rapid atrial fibrillation.  She has converted to sinus rhythm overnight.  She is waking up somewhat and is in sinus tach this am   Objective:    Vital Signs:   Temp:  [97.3 F (36.3 C)-98.9 F (37.2 C)] 98.9 F (37.2 C) (09/04 0800) Pulse Rate:  [74-114] 114  (09/04 0800) Resp:  [10-17] 14  (09/04 0800) BP: (109-128)/(54) 128/54 mmHg (09/03 2357) SpO2:  [95 %-99 %] 98 % (09/04 0800) FiO2 (%):  [30 %] 30 % (09/04 0718) Weight:  [147 lb 14.9 oz (67.1 kg)] 147 lb 14.9 oz (67.1 kg) (09/04 0000)      24-hour weight change: Weight change: -4 lb 3 oz (-1.9 kg)  Weight trends: Filed Weights   11/24/11 0300 11/25/11 0400 11/26/11 0000  Weight: 148 lb 9.4 oz (67.4 kg) 152 lb 1.9 oz (69 kg) 147 lb 14.9 oz (67.1 kg)    Intake/Output:  09/03 0701 - 09/04 0700 In: 2013.3 [I.V.:1163.3; IV Piggyback:850] Out: 4075 [Urine:3950; Emesis/NG output:125] Total I/O In: 86.7 [I.V.:36.7; IV Piggyback:50] Out: 150 [Urine:150]   Physical Exam: BP 128/54  Pulse 114  Temp 98.9 F (37.2 C) (Axillary)  Resp 14  Ht 4\' 11"  (1.499 m)  Wt 147 lb 14.9 oz (67.1 kg)  BMI 29.88 kg/m2  SpO2 98%  General: Vital signs reviewed and noted. Intubated, sedated, not responsive to command  Head: Normocephalic, atraumatic.  Eyes: conjunctivae/corneas clear.  EOM's intact.   Throat: intubated  Neck: Supple. Normal carotids. No JVD  Lungs:  Vent.  Heart: Regular rate,  With normal  S1 S2. No murmurs, gallops or rubs. tachy  Abdomen:  Midline incision.    Extremities: .   Neurologic: A&O X3, CN II - XII are grossly intact. Motor strength is 5/5 in the all 4 extremities.  Psych: unresponsive    Labs: BMET:  Basename 11/26/11 0445 11/25/11 0417 11/24/11 0513 11/23/11 2349  NA 137 126* -- --  K 3.3* 3.9 -- --  CL 105 97 -- --  CO2 25 23 -- --  GLUCOSE  88 80 -- --  BUN 11 10 -- --  CREATININE 0.59 0.50 -- --  CALCIUM 7.1* 6.8* -- --  MG -- -- 1.9 2.0  PHOS -- -- 2.2* --    Liver function tests:  Basename 11/25/11 0417  AST 11  ALT 8  ALKPHOS 104  BILITOT 0.2*  PROT 3.3*  ALBUMIN 1.2*   No results found for this basename: LIPASE:2,AMYLASE:2 in the last 72 hours  CBC:  Basename 11/26/11 0445 11/25/11 0417  WBC 26.4* 20.3*  NEUTROABS -- --  HGB 8.3* 8.0*  HCT 23.7* 22.7*  MCV 87.5 86.6  PLT 172 121*    Cardiac Enzymes:  Basename 11/23/11 1611  CKTOTAL --  CKMB --  TROPONINI <0.30    Coagulation Studies:  Basename 11/24/11 0513  LABPROT 14.8  INR 1.14    Other: No components found with this basename: POCBNP:3 No results found for this basename: DDIMER in the last 72 hours No results found for this basename: HGBA1C in the last 72 hours No results found for this basename: CHOL,HDL,LDLCALC,TRIG,CHOLHDL in the last 72 hours  Basename 11/24/11 1257  TSH 0.252*  T4TOTAL --  T3FREE --  THYROIDAB --   No results found for this basename: VITAMINB12,FOLATE,FERRITIN,TIBC,IRON,RETICCTPCT in the last 72 hours   Tele:  Sinus rhythm  Medications:    Infusions:    . amiodarone (NEXTERONE PREMIX) 360 mg/200 mL dextrose 0.5 mg/min (11/26/11 0600)  . dextrose 5 % and 0.9 % NaCl with KCl 40 mEq/L    . DISCONTD: sodium chloride 20 mL/hr at 11/25/11 1136  . DISCONTD: fentaNYL infusion INTRAVENOUS 50 mcg/hr (11/25/11 0000)  . DISCONTD: midazolam (VERSED) infusion 1 mg/hr (11/24/11 1643)  . DISCONTD: phenylephrine (NEO-SYNEPHRINE) Adult infusion Stopped (11/24/11 1610)  . DISCONTD: vasopressin (PITRESSIN) infusion - *FOR SHOCK* Stopped (11/24/11 1642)    Scheduled Medications:    . antiseptic oral rinse  15 mL Mouth Rinse QID  . chlorhexidine  15 mL Mouth Rinse BID  . ciprofloxacin  400 mg Intravenous BID  . enoxaparin (LOVENOX) injection  40 mg Subcutaneous Q24H  . fluconazole (DIFLUCAN) IV  200 mg  Intravenous Q24H  . furosemide  20 mg Intravenous Once  . furosemide  20 mg Intravenous Once  . insulin aspart  0-15 Units Subcutaneous Q4H  . metronidazole  500 mg Intravenous Q8H  . pantoprazole (PROTONIX) IV  40 mg Intravenous QHS  . potassium chloride  10 mEq Intravenous Q1 Hr x 4  . DISCONTD: hydrocortisone sod succinate (SOLU-CORTEF) injection  50 mg Intravenous Q6H  . DISCONTD: hydrocortisone sod succinate (SOLU-CORTEF) injection  50 mg Intravenous Q12H    Assessment/ Plan:    Septic shock (11/22/2011) Plans per PCCM.  WBC is 26K .  Atrial fibrillation (11/23/2011)  she has converted to sinus rhythm and is now on amiodarone drip.  Would continue amiodarone short term.  I suspect this A-Fib is due to the stress of the peritonitis / pelvic abscess.    Hypotension:    CHF:  EF is 25-30%, mod. AI, mild MR, mod. TR,    Will try Metoprolol 5 mg IV Q 6.  Disposition:  Length of Stay: 5  Vesta Mixer, Montez Hageman., MD, [facility] 11/26/2011, 9:13 AM Office [phone] Pager [phone]

## 2011-11-26 NOTE — Progress Notes (Signed)
eLink Physician-Brief Progress Note Patient Name: [name] DOB: 1932-02-01 MRN: [id number]  Date of Service  11/26/2011   HPI/Events of Note   Hypokalemia  eICU Interventions  KCL IV given    Intervention Category Major Interventions: Electrolyte abnormality - evaluation and management  Shan Levans 11/26/2011, 6:14 PM

## 2011-11-26 NOTE — Progress Notes (Signed)
Pt seen and examined earlier this evening.  Intubated, no FC abd soft, serous drainage from opened portions of incision Ostomy viable - no air in beg, mild distension; ng tube output going down  Wean vent per ccm Not ready for enteral feeds, cont og to [facility] with TPN Increasing wbc. Cont abx. Follow wbc. A little too soon for intra-abd abscess Wound care. If serous drainage starts to affect skin - may need to remove staples and placed wound vac  Spoke with husband  Mary Sella. Andrey Campanile, MD, FACS General, Bariatric, & Minimally Invasive Surgery [facility], [place]

## 2011-11-27 ENCOUNTER — Inpatient Hospital Stay (HOSPITAL_COMMUNITY): Payer: Medicare Other

## 2011-11-27 ENCOUNTER — Ambulatory Visit: Payer: No Typology Code available for payment source | Admitting: Family

## 2011-11-27 ENCOUNTER — Other Ambulatory Visit: Payer: No Typology Code available for payment source | Admitting: Lab

## 2011-11-27 DIAGNOSIS — E46 Unspecified protein-calorie malnutrition: Secondary | ICD-10-CM

## 2011-11-27 LAB — PHOSPHORUS
Phosphorus: 2 mg/dL — ABNORMAL LOW (ref 2.3–4.6)
Phosphorus: 3.3 mg/dL (ref 2.3–4.6)

## 2011-11-27 LAB — DIFFERENTIAL
Basophils Absolute: 0 10*3/uL (ref 0.0–0.1)
Basophils Relative: 0 % (ref 0–1)
Eosinophils Absolute: 0 10*3/uL (ref 0.0–0.7)
Monocytes Absolute: 1.6 10*3/uL — ABNORMAL HIGH (ref 0.1–1.0)
Neutro Abs: 20.9 10*3/uL — ABNORMAL HIGH (ref 1.7–7.7)
Neutrophils Relative %: 91 % — ABNORMAL HIGH (ref 43–77)

## 2011-11-27 LAB — CBC
HCT: 26.8 % — ABNORMAL LOW (ref 36.0–46.0)
Hemoglobin: 9.1 g/dL — ABNORMAL LOW (ref 12.0–15.0)
MCH: 29.6 pg (ref 26.0–34.0)
MCHC: 34 g/dL (ref 30.0–36.0)
RDW: 16 % — ABNORMAL HIGH (ref 11.5–15.5)

## 2011-11-27 LAB — COMPREHENSIVE METABOLIC PANEL
BUN: 7 mg/dL (ref 6–23)
Calcium: 7.2 mg/dL — ABNORMAL LOW (ref 8.4–10.5)
GFR calc Af Amer: >90 mL/min (ref >90)
Glucose, Bld: 158 mg/dL — ABNORMAL HIGH (ref 70–99)
Total Protein: 3.9 g/dL — ABNORMAL LOW (ref 6.0–8.3)

## 2011-11-27 LAB — GLUCOSE, CAPILLARY: Glucose-Capillary: 184 mg/dL — ABNORMAL HIGH (ref 70–99)

## 2011-11-27 LAB — MAGNESIUM
Magnesium: 1.4 mg/dL — ABNORMAL LOW (ref 1.5–2.5)
Magnesium: 1.4 mg/dL — ABNORMAL LOW (ref 1.5–2.5)

## 2011-11-27 LAB — BASIC METABOLIC PANEL
BUN: 7 mg/dL (ref 6–23)
CO2: 32 mEq/L (ref 19–32)
Chloride: 91 mEq/L — ABNORMAL LOW (ref 96–112)
Creatinine, Ser: 0.53 mg/dL (ref 0.50–1.10)
Glucose, Bld: 182 mg/dL — ABNORMAL HIGH (ref 70–99)

## 2011-11-27 LAB — CHOLESTEROL, TOTAL: Cholesterol: 72 mg/dL (ref 0–200)

## 2011-11-27 MED ORDER — MAGNESIUM SULFATE 40 MG/ML IJ SOLN
2.0000 g | Freq: Once | INTRAMUSCULAR | Status: AC
  Administered 2011-11-27: 2 g via INTRAVENOUS
  Filled 2011-11-27: qty 50

## 2011-11-27 MED ORDER — POTASSIUM PHOSPHATE DIBASIC 3 MMOLE/ML IV SOLN
30.0000 mmol | Freq: Once | INTRAVENOUS | Status: AC
  Administered 2011-11-27: 30 mmol via INTRAVENOUS
  Filled 2011-11-27: qty 10

## 2011-11-27 MED ORDER — FENTANYL BOLUS VIA INFUSION
50.0000 ug | Freq: Four times a day (QID) | INTRAVENOUS | Status: DC | PRN
  Filled 2011-11-27: qty 100

## 2011-11-27 MED ORDER — MIDAZOLAM HCL 5 MG/ML IJ SOLN
2.0000 mg | INTRAMUSCULAR | Status: DC | PRN
  Administered 2011-11-28 (×2): 2 mg via INTRAVENOUS
  Filled 2011-11-27 (×2): qty 1

## 2011-11-27 MED ORDER — FUROSEMIDE 10 MG/ML IJ SOLN
40.0000 mg | Freq: Three times a day (TID) | INTRAMUSCULAR | Status: AC
  Administered 2011-11-27 (×2): 40 mg via INTRAVENOUS
  Filled 2011-11-27 (×2): qty 4

## 2011-11-27 MED ORDER — AMIODARONE HCL IN DEXTROSE 360-4.14 MG/200ML-% IV SOLN
30.0000 mg/h | INTRAVENOUS | Status: DC
  Filled 2011-11-27: qty 200

## 2011-11-27 MED ORDER — CLINIMIX E/DEXTROSE (5/20) 5 % IV SOLN
INTRAVENOUS | Status: AC
  Administered 2011-11-27: 17:00:00 via INTRAVENOUS
  Filled 2011-11-27: qty 1000

## 2011-11-27 MED ORDER — SODIUM CHLORIDE 0.9 % IV BOLUS (SEPSIS)
1000.0000 mL | Freq: Once | INTRAVENOUS | Status: AC
  Administered 2011-11-27: 1000 mL via INTRAVENOUS

## 2011-11-27 MED ORDER — SODIUM CHLORIDE 0.9 % IV BOLUS (SEPSIS)
500.0000 mL | Freq: Once | INTRAVENOUS | Status: AC
  Administered 2011-11-27: 500 mL via INTRAVENOUS

## 2011-11-27 MED ORDER — SODIUM CHLORIDE 0.9 % IV SOLN
INTRAVENOUS | Status: DC
  Administered 2011-11-27: 20 mL via INTRAVENOUS
  Administered 2011-12-02 – 2011-12-07 (×3): 20 mL/h via INTRAVENOUS
  Administered 2011-12-08: 23:00:00 via INTRAVENOUS

## 2011-11-27 MED ORDER — AMIODARONE LOAD VIA INFUSION
150.0000 mg | Freq: Once | INTRAVENOUS | Status: AC
  Administered 2011-11-27: 150 mg via INTRAVENOUS
  Filled 2011-11-27: qty 83.34

## 2011-11-27 MED ORDER — MIDAZOLAM HCL 5 MG/ML IJ SOLN
INTRAMUSCULAR | Status: AC
  Administered 2011-11-27: 2 mg
  Filled 2011-11-27: qty 1

## 2011-11-27 MED ORDER — POTASSIUM CHLORIDE 10 MEQ/50ML IV SOLN
10.0000 meq | INTRAVENOUS | Status: AC
  Administered 2011-11-27 – 2011-11-28 (×6): 10 meq via INTRAVENOUS
  Filled 2011-11-27: qty 300

## 2011-11-27 MED ORDER — AMIODARONE HCL IN DEXTROSE 360-4.14 MG/200ML-% IV SOLN
60.0000 mg/h | INTRAVENOUS | Status: AC
  Administered 2011-11-27 (×2): 60 mg/h via INTRAVENOUS

## 2011-11-27 MED ORDER — SODIUM CHLORIDE 0.9 % IV SOLN
50.0000 ug/h | INTRAVENOUS | Status: DC
  Administered 2011-11-27: 25 ug/h via INTRAVENOUS
  Administered 2011-11-28: 100 ug/h via INTRAVENOUS
  Administered 2011-11-30: 150 ug/h via INTRAVENOUS
  Filled 2011-11-27 (×3): qty 50

## 2011-11-27 MED ORDER — MIDAZOLAM HCL 5 MG/ML IJ SOLN: 2.0000 mg | INTRAMUSCULAR | Status: DC | PRN

## 2011-11-27 NOTE — Progress Notes (Signed)
5 Days Post-Op  Subjective: No real change, unable to wean from vent..  She is mildly agitated, she would kind of look at me, but would not squeeze my han.d  Objective: Vital signs in last 24 hours: Temp:  [97.2 F (36.2 C)-100.3 F (37.9 C)] 100.3 F (37.9 C) (09/05 0800) Pulse Rate:  [86-124] 124  (09/05 0743) Resp:  [12-33] 20  (09/05 0800) BP: (82-135)/(35-78) 111/55 mmHg (09/05 0800) SpO2:  [90 %-99 %] 90 % (09/05 0800) FiO2 (%):  [30 %-35 %] 35 % (09/05 0755) Weight:  [65.7 kg (144 lb 13.5 oz)] 65.7 kg (144 lb 13.5 oz) (09/05 0400)   500 ml recorded from NG, 4150 from Urine output, Tm 100.3,Tachycardic, K+ 3.1, Mg1.4, both being replaced   Intake/Output from previous day: 09/04 0701 - 09/05 0700 In: 2865.8 [I.V.:1034.1; IV Piggyback:1060; TPN:571.7] Out: 4650 [Urine:4150; Emesis/NG output:500] Intake/Output this shift: Total I/O In: -  Out: 300 [Urine:300]  General appearance: sedated, will not follow commands, pulling at tubes and dressing.  Mitttens on her hands. Resp: clear to auscultation bilaterally and anterior exam, GI: soft, some minimal liquid stool in bag, not much gas. Open portions of the wound apper to be normal.  Lab Results:   Basename 11/27/11 0530 11/26/11 0445  WBC 23.0* 26.4*  HGB 9.1* 8.3*  HCT 26.8* 23.7*  PLT 201 172    BMET  Basename 11/27/11 0530 11/26/11 1541  NA 138 141  K 3.1* 3.0*  CL 103 106  CO2 26 26  GLUCOSE 158* 94  BUN 7 10  CREATININE 0.53 0.59  CALCIUM 7.2* 7.2*   PT/INR No results found for this basename: LABPROT:2,INR:2 in the last 72 hours   Lab 11/27/11 0530 11/25/11 0417 11/23/11 0415 11/21/11 1909 11/20/11 1055  AST 14 11 15 13 13   ALT 12 8 8 12 14   ALKPHOS 120* 104 54 63 97  BILITOT 0.2* 0.2* 0.2* 0.6 0.90  PROT 3.9* 3.3* 3.2* 5.1* 5.4*  ALBUMIN 1.7* 1.2* 1.1* 2.5* 2.8*     Lipase     Component Value Date/Time   LIPASE 9* 11/21/2011 1909     Studies/Results: Dg Chest Port 1 View  11/27/2011   *RADIOLOGY REPORT*  Clinical Data: Follow up CHF  PORTABLE CHEST - 1 VIEW  Comparison: Most recent prior chest x-ray yesterday, 11/26/2011  Findings: Unchanged support apparatus including endotracheal tube, incompletely visualized nasogastric tube, right IJ portacatheter and left subclavian central venous catheter.  Decreased aeration of the bilateral lung bases with developing opacities and obliteration of the costophrenic angles.  There is pulmonary vascular congestion and mild interstitial edema.  Likely bilateral layering pleural effusions.  IMPRESSION:  1.  Increased pulmonary edema, and bibasilar opacities likely reflecting a combination of enlarging pleural effusions with associated atelectasis. Superimposed infiltrate is difficult to exclude, although this is thought less likely.  2.  Stable and satisfactory support apparatus as above.   Original Report Authenticated By: Alvino Blood Chest Port 1 View  11/26/2011  *RADIOLOGY REPORT*  Clinical Data: Follow up pleural effusions.  PORTABLE CHEST - 1 VIEW  Comparison: 11/25/2011.  Findings:  Right central line and left central line are in place. Tips overlap each other at the level of the mid to distal superior vena cava.Exact position of individual catheter tips difficult to discern with certainty.  One of the catheter tip space at the level of the right atrium.  The appearance is without change.  No gross pneumothorax.  Endotracheal tube  tip 2.6 cm above the carina.  Nasogastric tube courses below the diaphragm.  The tip is not included on this exam.  Consolidation left base may represent atelectasis with pleural effusion although underlying infiltrate or mass not excluded. Posteriorly layering right-sided pleural effusion may be present.  Pulmonary vascular congestion.  IMPRESSION: No significant change.  Please see above.   Original Report Authenticated By: Fuller Canada, M.D.     Medications:    . antiseptic oral rinse  15 mL Mouth Rinse QID  .  chlorhexidine  15 mL Mouth Rinse BID  . ciprofloxacin  400 mg Intravenous BID  . enoxaparin (LOVENOX) injection  40 mg Subcutaneous Q24H  . fentaNYL  100 mcg Intravenous Once  . furosemide  20 mg Intravenous Once  . furosemide  40 mg Intravenous Q8H  . insulin aspart  0-15 Units Subcutaneous Q4H  . magnesium sulfate 1 - 4 g bolus IVPB  2 g Intravenous Once  . metoprolol  5 mg Intravenous Q6H  . metronidazole  500 mg Intravenous Q8H  . midazolam  2 mg Intravenous Once  . pantoprazole (PROTONIX) IV  40 mg Intravenous QHS  . potassium chloride  10 mEq Intravenous Q1 Hr x 4  . potassium chloride  10 mEq Intravenous Q1 Hr x 4  . potassium phosphate IVPB (mmol)  30 mmol Intravenous Once  . DISCONTD: fluconazole (DIFLUCAN) IV  200 mg Intravenous Q24H    Assessment/Plan Intra-abdominal sepsis due to ruptured sigmoid diverticulum EXPLORATORY LAPAROTOMY,  COLON RESECTION SIGMOID, COLOSTOMY, Dr. Darnell Level 11/22/2011  Low grade B Cell, non hodgkin's lymphoma on R-CVP chemo with neutropenia  Shock, on pressors  Post op ARDS  Post op Hyponatremia,Hypophosphatemia, Hypomagnesemia, Hypocalcemia (NA improved today)  Post op /atrial Fibrillation on Amiodarone drip. IN Sinus rhythm.  Echo 11/23/11: Periapical area akinetic, EF 25-30%, Moderate AR, Mild MR,  Hx TIA  Hx Hypertension  Hx. arthritis  Hx of Esophageal strictures x 2   Plan: CCM is managing vent and fluids, Telem shows sinus tachycardia, amiodarone is off.  Watch her WBC. If it does not improve we may need to repeat CT.   LOS: 6 days    Dewel Lotter 11/27/2011

## 2011-11-27 NOTE — Progress Notes (Signed)
Name: [name] MRN: [id number] DOB: [birth year]    LOS: 6  Referring Provider:  Dr. Gerrit Friends  Reason for Referral:  Septic shock  PULMONARY / CRITICAL CARE MEDICINE  HPI:  78 yo female admitted 11/21/2011 5 days of abdominal pain.  Found to have perforated sigmoid diverticulum with peritonitis, and pelvic abscess.  Had laparotomy 8/31.  Developed respiratory failure and shock post-op.  PCCM consulted 8/31.  She has hx of B cell lymphoma currently on chemo as outpt.  Events Since Admission: 8/31 Laparotomy, sigmoid colectomy and ostomy 9/01 A fib with RVR 9/02 Off pressors  Subjective:  Not able to tolerate vent weaning this morning.  Vital Signs: Temp:  [97.2 F (36.2 C)-99.4 F (37.4 C)] 97.2 F (36.2 C) (09/05 0400) Pulse Rate:  [86-120] 120  (09/05 0307) Resp:  [12-26] 25  (09/05 0600) BP: (82-135)/(35-78) 111/60 mmHg (09/05 0600) SpO2:  [90 %-99 %] 91 % (09/05 0600) FiO2 (%):  [30 %] 30 % (09/05 0307) Weight:  [144 lb 13.5 oz (65.7 kg)] 144 lb 13.5 oz (65.7 kg) (09/05 0400)  Intake/Output Summary (Last 24 hours) at 11/27/11 0751 Last data filed at 11/27/11 0600  Gross per 24 hour  Intake 2865.76 ml  Output   4650 ml  Net -1784.24 ml   Physical Examination: General: Ill appearing Neuro: Somnolent, agitated, not following commands, moves all extremities HEENT:  ETT in place Neck:  Supple Cardiovascular: s1s2 regular, no murmur Lungs: decreased breath sounds, no wheeze, basilar rales Abdomen:  Wound dressings clean Musculoskeletal:  No edema Skin:  No rashes.    Dg Chest Port 1 View  11/27/2011  *RADIOLOGY REPORT*  Clinical Data: Follow up CHF  PORTABLE CHEST - 1 VIEW  Comparison: Most recent prior chest x-ray yesterday, 11/26/2011  Findings: Unchanged support apparatus including endotracheal tube, incompletely visualized nasogastric tube, right IJ portacatheter and left subclavian central venous catheter.  Decreased aeration of the bilateral lung bases with  developing opacities and obliteration of the costophrenic angles.  There is pulmonary vascular congestion and mild interstitial edema.  Likely bilateral layering pleural effusions.  IMPRESSION:  1.  Increased pulmonary edema, and bibasilar opacities likely reflecting a combination of enlarging pleural effusions with associated atelectasis. Superimposed infiltrate is difficult to exclude, although this is thought less likely.  2.  Stable and satisfactory support apparatus as above.   Original Report Authenticated By: Alvino Blood Chest Port 1 View  11/26/2011  *RADIOLOGY REPORT*  Clinical Data: Follow up pleural effusions.  PORTABLE CHEST - 1 VIEW  Comparison: 11/25/2011.  Findings:  Right central line and left central line are in place. Tips overlap each other at the level of the mid to distal superior vena cava.Exact position of individual catheter tips difficult to discern with certainty.  One of the catheter tip space at the level of the right atrium.  The appearance is without change.  No gross pneumothorax.  Endotracheal tube tip 2.6 cm above the carina.  Nasogastric tube courses below the diaphragm.  The tip is not included on this exam.  Consolidation left base may represent atelectasis with pleural effusion although underlying infiltrate or mass not excluded. Posteriorly layering right-sided pleural effusion may be present.  Pulmonary vascular congestion.  IMPRESSION: No significant change.  Please see above.   Original Report Authenticated By: Fuller Canada, M.D.      ASSESSMENT AND PLAN  PULMONARY  Lab 11/25/11 4098 11/24/11 1191 11/23/11 1613 11/23/11 4782 11/22/11 1730 11/22/11 1648  PHART 7.444 7.377 7.367 7.374 -- 7.340*  PCO2ART 33.5* 34.1* 34.4* 29.3* -- 30.6*  PO2ART 89.1 80.5 69.2* 107.0* -- 114.0*  HCO3 22.7 19.8* 19.3* 16.9* -- 16.1*  O2SAT 98.7 97.8 94.9 99.3 73.7 --   Ventilator Settings: Vent Mode:  [-] PRVC FiO2 (%):  [30 %] 30 % Set Rate:  [12 bmp] 12 bmp Vt Set:  [350  mL] 350 mL PEEP:  [5 cmH20] 5 cmH20 Plateau Pressure:  [11 cmH20-15 cmH20] 15 cmH20  ETT:  8/31 >>   A: Acute respiratory failure in setting of septic shock from peritonitis. P:   F/u CXR Pressure support wean as tolerated>>not sure how soon she will be ready for extubation Keep in negative fluid balance as tolerated No hx of obstructive lung disease or smoking>>changed BD's to prn  CARDIOVASCULAR  Lab 11/23/11 1611 11/23/11 0415 11/22/11 1259 11/22/11 0330  TROPONINI <0.30 -- -- <0.30  LATICACIDVEN -- 5.4* 5.8* 4.7*  PROBNP -- -- -- --   Lines:  [place]t [place] CVL 8/31 >>   Echo 9/01>>periapical akinesis, EF 25 to 30%, diffuse hypokinesis, grade 1 diastolic dysfx, mod AR, mild MR, mod TR, mild/mod RV systolic dysfx, PAS 37 mmHg  A: Septic shock 2nd to peritonitis>>resolved. A fib with RVR>>back in sinus 9/02. Acute systolic CHF>>?if this is sepsis related cardiomyopathy. P:  Negative fluid balance to keep CVP < 10 IV lopressors, amiodarone gtt per cardiology Increased lasix to 40 mg IV q8h x 2 doses on 9/05  RENAL  Lab 11/27/11 0530 11/26/11 1541 11/26/11 1030 11/26/11 0445 11/25/11 0417 11/24/11 0513 11/23/11 2349 11/23/11 0415  NA 138 141 -- 137 126* 122* -- --  K 3.1* 3.0* -- -- -- -- -- --  CL 103 106 -- 105 97 94* -- --  CO2 26 26 -- 25 23 21  -- --  BUN 7 10 -- 11 10 8  -- --  CREATININE 0.53 0.59 -- 0.59 0.50 0.46* -- --  CALCIUM 7.2* 7.2* -- 7.1* 6.8* 6.6* -- --  MG 1.4* -- 1.8 -- -- 1.9 2.0 1.2*  PHOS 2.0* -- 2.3 -- -- 2.2* -- 1.7*   Intake/Output      09/04 0701 - 09/05 0700 09/05 0701 - 09/06 0700   I.V. (mL/kg) 1034.1 (15.7)    Other 200    IV Piggyback 1060    TPN 571.7    Total Intake(mL/kg) 2865.8 (43.6)    Urine (mL/kg/hr) 4150 (2.6)    Emesis/NG output 500    Total Output 4650    Net -1784.2          Foley:  Placed 11/21/2011  A: Hypokalemia, hypomagnesemia, hypophosphatemia. P:   KVO IV fluids now that she is getting TNA Diurese as  tolerated F/u BMET, electrolytes Monitor renal fx, urine outpt  GASTROINTESTINAL  Lab 11/27/11 0530 11/25/11 0417 11/23/11 0415 11/21/11 1909 11/20/11 1055  AST 14 11 15 13 13   ALT 12 8 8 12 14   ALKPHOS 120* 104 54 63 97  BILITOT 0.2* 0.2* 0.2* 0.6 0.90  PROT 3.9* 3.3* 3.2* 5.1* 5.4*  ALBUMIN 1.7* 1.2* 1.1* 2.5* 2.8*    A:  Perforated sigmoid diverticulum s/p laparotomy. Nutrition. P:   Post-op care per [facility] TNA started 9/04 per [facility]  HEMATOLOGIC  Lab 11/27/11 0530 11/26/11 0445 11/25/11 0417 11/24/11 0513 11/23/11 0415 11/22/11 1258 11/22/11 0330  HGB 9.1* 8.3* 8.0* 8.1* 8.9* -- --  HCT 26.8* 23.7* 22.7* 23.2* 26.3* -- --  PLT 201 172 121* 132*  166 -- --  INR -- -- -- 1.14 -- 1.40 1.18  APTT -- -- -- -- -- 31 33   A:  Follicular B cell lymphoma, currently undergoing chemotherapy. Anemia of critical illness, and chronic disease. Thrombocytopenia>>likely related to sepsis/critical illness P:  F/u CBC Transfuse for Hb < 7  INFECTIOUS  Lab 11/27/11 0530 11/26/11 0445 11/25/11 0417 11/24/11 0513 11/23/11 0415 11/22/11 0330  WBC 23.0* 26.4* 20.3* 22.5* 11.7* --  PROCALCITON -- -- -- -- 2.35 4.18   Cultures: Blood 8/31 >> Urine 8/31 >>negative Peritoneal abscess 8/31 >>  Sputum 9/05 >>  Antibiotics: - Cipro 8/31 >>  - Flagyl 8/31 >>  - fluconazole 8/31 >>   A:  Peritonitis 2nd to sigmoid perforation. Increased respiratory secretions 9/05. P:   D5/x cipro, flagyl, diflucan Check sputum cx  ENDOCRINE  Lab 11/27/11 0355 11/26/11 2357 11/26/11 1953 11/26/11 1658 11/26/11 1116  GLUCAP 130* 134* 128* 75 91   A:  Hyperglycemia. P:   SSI  NEUROLOGIC  A:  Sedation. P:   Limit sedation while weaning vent>>change to intermittent sedation protocol  BEST PRACTICE / DISPOSITION Level of Care:  ICU Primary Service:  Surgery Consultants: PCCM, [place] Cards, Oncology Code Status:  Limited>>no CPR, no defibrillation Diet:  N.p.o. DVT Px:  Lovenox GI Px:   Protonix Skin Integrity:  Good Social / Family: Updated family 9/03  CC time 35 minutes  Coralyn Helling, MD [facility] Pulmonary/Critical Care 11/27/2011, 7:51 AM Pager:  [phone] After 3pm call: [phone]

## 2011-11-27 NOTE — Clinical Social Work Note (Signed)
CSW met with husband and daughter in law to check in and assess needs. Husband very supportive of Pt and coping adequately with good support from extended family. He reports to be very pleased with the care his wife is receiving.  Daughter in law and son went to look at several SNFs, as that is what they feel will be needed as Pt improves. They are interested in [address], [address], [place] and [address]. CSW explained SNF process and that several things needed to occur prior to Pt being ready. CSW also explained that if Pt needed TNA at SNF, only a few SNFs can meet this need. Daughter in law stated understanding.   CSW to begin preliminary SNF search and to continue to follow for support.   Doreen Salvage, LCSWA ICU/Stepdown Clinical Social Worker [facility] Cell [phone] Hours 8am-1200pm M-F

## 2011-11-27 NOTE — Progress Notes (Signed)
Recurrent AFRVR with low BP  Amiodarone gtt resumed   Billy Fischer, MD ; [facility] [phone].  After 5:30 PM or weekends, call [phone]

## 2011-11-27 NOTE — Progress Notes (Signed)
CARE MANAGE MENT UTILIZATION REVIEW NOTE 11/27/2011     Patient:  SHAREA, GUINTHER   Account Number:  [id number]  Documented by:  Bjorn Loser DAVIS   Per Ur Regulation Reviewed for med. necessity/level of care/duration of stay

## 2011-11-27 NOTE — Progress Notes (Signed)
PARENTERAL NUTRITION CONSULT NOTE   Pharmacy Consult for TPN Indication: Prolonged ileus  Allergies  Allergen Reactions  . Codeine Hives  . Other Nausea And Vomiting    anesthesia  . Penicillins Hives  . Sulfa Antibiotics Hives    Patient Measurements: Height: 4\' 11"  (149.9 cm) Weight: 144 lb 13.5 oz (65.7 kg) IBW/kg (Calculated) : 43.2  Adjusted Body Weight: 50.4  Vital Signs: Temp: 97.2 F (36.2 C) (09/05 0400) Temp src: Oral (09/05 0400) BP: 114/72 mmHg (09/05 0743) Pulse Rate: 124  (09/05 0743) Intake/Output from previous day: 09/04 0701 - 09/05 0700 In: 2865.8 [I.V.:1034.1; IV Piggyback:1060; TPN:571.7] Out: 4650 [Urine:4150; Emesis/NG output:500]  Labs:  [facility] 11/27/11 0530 11/26/11 0445 11/25/11 0417  WBC 23.0* 26.4* 20.3*  HGB 9.1* 8.3* 8.0*  HCT 26.8* 23.7* 22.7*  PLT 201 172 121*  APTT -- -- --  INR -- -- --     Basename 11/27/11 0530 11/26/11 1541 11/26/11 1030 11/26/11 0445 11/25/11 0417  NA 138 141 -- 137 --  K 3.1* 3.0* -- 3.3* --  CL 103 106 -- 105 --  CO2 26 26 -- 25 --  GLUCOSE 158* 94 -- 88 --  BUN 7 10 -- 11 --  CREATININE 0.53 0.59 -- 0.59 --  LABCREA -- -- -- -- --  CREAT24HRUR -- -- -- -- --  CALCIUM 7.2* 7.2* -- 7.1* --  MG 1.4* -- 1.8 -- --  PHOS 2.0* -- 2.3 -- --  PROT 3.9* -- -- -- 3.3*  ALBUMIN 1.7* -- -- -- 1.2*  AST 14 -- -- -- 11  ALT 12 -- -- -- 8  ALKPHOS 120* -- -- -- 104  BILITOT 0.2* -- -- -- 0.2*  BILIDIR -- -- -- -- --  IBILI -- -- -- -- --  PREALBUMIN -- -- 11.7* -- --  TRIG -- -- -- -- --  CHOLHDL -- -- -- -- --  CHOL -- -- -- -- --  9/5 Corrected Ca: 9.04 Estimated Creatinine Clearance: 48.5 ml/min (by C-G formula based on Cr of 0.53).    Basename 11/27/11 0355 11/26/11 2357 11/26/11 1953  GLUCAP 130* 134* 128*    Medications:  Scheduled:     . antiseptic oral rinse  15 mL Mouth Rinse QID  . chlorhexidine  15 mL Mouth Rinse BID  . ciprofloxacin  400 mg Intravenous BID  . enoxaparin (LOVENOX)  injection  40 mg Subcutaneous Q24H  . fentaNYL  100 mcg Intravenous Once  . furosemide  20 mg Intravenous Once  . furosemide  40 mg Intravenous Q8H  . insulin aspart  0-15 Units Subcutaneous Q4H  . magnesium sulfate 1 - 4 g bolus IVPB  2 g Intravenous Once  . metoprolol  5 mg Intravenous Q6H  . metronidazole  500 mg Intravenous Q8H  . midazolam  2 mg Intravenous Once  . pantoprazole (PROTONIX) IV  40 mg Intravenous QHS  . potassium chloride  10 mEq Intravenous Q1 Hr x 4  . potassium chloride  10 mEq Intravenous Q1 Hr x 4  . potassium phosphate IVPB (mmol)  30 mmol Intravenous Once  . DISCONTD: fluconazole (DIFLUCAN) IV  200 mg Intravenous Q24H  . DISCONTD: hydrocortisone sod succinate (SOLU-CORTEF) injection  50 mg Intravenous Q12H   Infusions:     . sodium chloride    . amiodarone (NEXTERONE PREMIX) 360 mg/200 mL dextrose 0.5 mg/min (11/27/11 0500)  . TPN (CLINIMIX) +/- additives 40 mL/hr at 11/26/11 2200   And  . fat  emulsion 250 mL (11/26/11 1834)  . DISCONTD: sodium chloride 20 mL/hr at 11/25/11 1136  . DISCONTD: dextrose 5 % and 0.9 % NaCl with KCl 40 mEq/L 30 mL/hr at 11/26/11 0933    Nutritional Goals:   RD recs 11/26/11:  1468 kcals, 80-90 gm protein, daily  Clinimix 5/20 goal rate of 60 ml/hr (+ Lipids MWF) to provide: 72 g/day protein and an average of 1472 Kcal/day (1746 Kcal/day MWF, 1267 Kcal/day STTHS).  Current nutrition:   Diet: NPO  IVF: [facility] at [facility]  CBGs & Insulin requirements past 24 hours:   CBGs: 74-184  Sensitive SSI Q4h:  6 units given  Assessment:   78 yo F admit on 8/30 w/ ruptured sigmoid diverticulum, peritonitis, and pelvic abscess. S/p laparotomy 8/31.  Fluid overload is a concern, to continue diuresis again today.   High risk of refeeding syndrome - patient with low K, Mag and Phos today.  Glucose became elevated after starting TPN.  Continue SSI for now and monitor closely  Renal/hepatic function:  WNL  Electrolytes: K, Mag and Phos are  low and replacement have been ordered.  Corrected Ca WNL.  Pre-Albumin: 11.7 (9/4)  TG/Cholesterol: pending  GI prophylaxis:  pantoprazole  Plan:  At 1800 tonight Continue Clinimix E 5/20 at 40 ml/hr; do not advance d/t electrolyte abnormalities. Fat emulsion at 10 ml/hr (MWF only due to ongoing shortage). TNA to contain standard multivitamins and trace elements (MWF only due to ongoing shortage). Continue IVF at [facility]  Continue Q4h CBGs and sensitive SSI as ordered.  TNA lab panels on Mondays & Thursdays. F/u daily.   Lynann Beaver PharmD, BCPS Pager [phone] 11/27/2011 8:10 AM

## 2011-11-27 NOTE — Progress Notes (Signed)
Wbc down some. No major events. Not able to wean vent this am.  Intubated No FC. MAE abd soft, nd, ostomy- dark but viable, no air in bag, some liquid stool Midline incision partially open - no cellulitis  Wean vent per ccm Cont iv abx Cont bowel rest - not ready for enteral feeds Cont TPN Spoke with one son via phone   [name]. Andrey Campanile, MD, FACS General, Bariatric, & Minimally Invasive Surgery [facility] Surgery, [place]

## 2011-11-27 NOTE — Progress Notes (Addendum)
PROGRESS NOTE  Subjective:   [name] is a 78 yo admitted with perforated sigmoid diverticulum complicated by peritonitis, pelvic abscess, and rapid atrial fibrillation.  She is still not responsive.     Objective:    Vital Signs:   Temp:  [97.2 F (36.2 C)-99.4 F (37.4 C)] 97.2 F (36.2 C) (09/05 0400) Pulse Rate:  [86-124] 124  (09/05 0743) Resp:  [12-33] 33  (09/05 0743) BP: (82-135)/(35-78) 114/72 mmHg (09/05 0743) SpO2:  [90 %-99 %] 90 % (09/05 0743) FiO2 (%):  [30 %-35 %] 35 % (09/05 0755) Weight:  [144 lb 13.5 oz (65.7 kg)] 144 lb 13.5 oz (65.7 kg) (09/05 0400)      24-hour weight change: Weight change: -3 lb 1.4 oz (-1.4 kg)  Weight trends: Filed Weights   11/25/11 0400 11/26/11 0000 11/27/11 0400  Weight: 152 lb 1.9 oz (69 kg) 147 lb 14.9 oz (67.1 kg) 144 lb 13.5 oz (65.7 kg)    Intake/Output:  09/04 0701 - 09/05 0700 In: 2865.8 [I.V.:1034.1; IV Piggyback:1060; TPN:571.7] Out: 4650 [Urine:4150; Emesis/NG output:500]     Physical Exam: BP 114/72  Pulse 124  Temp 97.2 F (36.2 C) (Oral)  Resp 33  Ht 4\' 11"  (1.499 m)  Wt 144 lb 13.5 oz (65.7 kg)  BMI 29.25 kg/m2  SpO2 90%  General: Vital signs reviewed and noted. Intubated, sedated, not responsive to command  Head: Normocephalic, atraumatic.  Eyes: conjunctivae/corneas clear.  EOM's intact.   Throat: intubated  Neck: Supple. Normal carotids. No JVD  Lungs:  Vent.  Heart: Regular rate,  With normal  S1 S2. No murmurs, gallops or rubs. tachy  Abdomen:  Midline incision.  - BS   Extremities: .   Neurologic: A&O X3, CN II - XII are grossly intact. Motor strength is 5/5 in the all 4 extremities.  Psych: unresponsive    Labs: BMET:  Basename 11/27/11 0530 11/26/11 1541 11/26/11 1030  NA 138 141 --  K 3.1* 3.0* --  CL 103 106 --  CO2 26 26 --  GLUCOSE 158* 94 --  BUN 7 10 --  CREATININE 0.53 0.59 --  CALCIUM 7.2* 7.2* --  MG 1.4* -- 1.8  PHOS 2.0* -- 2.3    Liver function  tests:  Basename 11/27/11 0530 11/25/11 0417  AST 14 11  ALT 12 8  ALKPHOS 120* 104  BILITOT 0.2* 0.2*  PROT 3.9* 3.3*  ALBUMIN 1.7* 1.2*   No results found for this basename: LIPASE:2,AMYLASE:2 in the last 72 hours  CBC:  Basename 11/27/11 0530 11/26/11 0445  WBC 23.0* 26.4*  NEUTROABS 20.9* --  HGB 9.1* 8.3*  HCT 26.8* 23.7*  MCV 87.3 87.5  PLT 201 172    Cardiac Enzymes: No results found for this basename: CKTOTAL:4,CKMB:4,TROPONINI:4 in the last 72 hours  Coagulation Studies: No results found for this basename: LABPROT:5,INR:5 in the last 72 hours  Other:  Basename 11/24/11 1257  TSH 0.252*  T4TOTAL --  T3FREE --  THYROIDAB --   No results found for this basename: VITAMINB12,FOLATE,FERRITIN,TIBC,IRON,RETICCTPCT in the last 72 hours   Tele:  Sinus rhythm, sinus tach  Medications:    Infusions:    . sodium chloride    . amiodarone (NEXTERONE PREMIX) 360 mg/200 mL dextrose 0.5 mg/min (11/27/11 0500)  . TPN (CLINIMIX) +/- additives 40 mL/hr at 11/26/11 2200   And  . fat emulsion 250 mL (11/26/11 1834)  . DISCONTD: sodium chloride 20 mL/hr at 11/25/11 1136  . DISCONTD: dextrose  5 % and 0.9 % NaCl with KCl 40 mEq/L 30 mL/hr at 11/26/11 1610    Scheduled Medications:    . antiseptic oral rinse  15 mL Mouth Rinse QID  . chlorhexidine  15 mL Mouth Rinse BID  . ciprofloxacin  400 mg Intravenous BID  . enoxaparin (LOVENOX) injection  40 mg Subcutaneous Q24H  . fentaNYL  100 mcg Intravenous Once  . furosemide  20 mg Intravenous Once  . furosemide  40 mg Intravenous Q8H  . insulin aspart  0-15 Units Subcutaneous Q4H  . magnesium sulfate 1 - 4 g bolus IVPB  2 g Intravenous Once  . metoprolol  5 mg Intravenous Q6H  . metronidazole  500 mg Intravenous Q8H  . midazolam  2 mg Intravenous Once  . pantoprazole (PROTONIX) IV  40 mg Intravenous QHS  . potassium chloride  10 mEq Intravenous Q1 Hr x 4  . potassium chloride  10 mEq Intravenous Q1 Hr x 4  .  potassium phosphate IVPB (mmol)  30 mmol Intravenous Once  . DISCONTD: fluconazole (DIFLUCAN) IV  200 mg Intravenous Q24H  . DISCONTD: hydrocortisone sod succinate (SOLU-CORTEF) injection  50 mg Intravenous Q12H    Assessment/ Plan:    Septic shock (11/22/2011) Plans per PCCM.  WBC is 23K .  Atrial fibrillation (11/23/2011)  she has IV amiodarone for 5 days.  She had converted even before amiodarone was started.  Will DC and continue Rx with metoprolol as tolerated.  Hypotension:    CHF:  EF is 25-30%, mod. AI, mild MR, mod. TR,    Will continue Metoprolol 5 mg IV Q 6.  Will sign off. Call for questions.  Disposition:  Length of Stay: 6  Vesta Mixer, Montez Hageman., MD, [facility] 11/27/2011, 8:16 AM Office [phone] Pager [phone]

## 2011-11-28 ENCOUNTER — Inpatient Hospital Stay (HOSPITAL_COMMUNITY): Payer: Medicare Other

## 2011-11-28 DIAGNOSIS — J90 Pleural effusion, not elsewhere classified: Secondary | ICD-10-CM

## 2011-11-28 LAB — COMPREHENSIVE METABOLIC PANEL
CO2: 31 mEq/L (ref 19–32)
Calcium: 6.6 mg/dL — ABNORMAL LOW (ref 8.4–10.5)
Chloride: 95 mEq/L — ABNORMAL LOW (ref 96–112)
Creatinine, Ser: 0.51 mg/dL (ref 0.50–1.10)
GFR calc Af Amer: >90 mL/min (ref >90)
GFR calc non Af Amer: >90 mL/min (ref >90)
Glucose, Bld: 128 mg/dL — ABNORMAL HIGH (ref 70–99)
Total Bilirubin: 0.2 mg/dL — ABNORMAL LOW (ref 0.3–1.2)

## 2011-11-28 LAB — BASIC METABOLIC PANEL
CO2: 30 mEq/L (ref 19–32)
Chloride: 94 mEq/L — ABNORMAL LOW (ref 96–112)
Creatinine, Ser: 0.51 mg/dL (ref 0.50–1.10)
Potassium: 3.8 mEq/L (ref 3.5–5.1)

## 2011-11-28 LAB — CHOLESTEROL, TOTAL: Cholesterol: 64 mg/dL (ref 0–200)

## 2011-11-28 LAB — CULTURE, BLOOD (ROUTINE X 2)

## 2011-11-28 LAB — GLUCOSE, CAPILLARY
Glucose-Capillary: 121 mg/dL — ABNORMAL HIGH (ref 70–99)
Glucose-Capillary: 173 mg/dL — ABNORMAL HIGH (ref 70–99)
Glucose-Capillary: 89 mg/dL (ref 70–99)

## 2011-11-28 LAB — BLOOD GAS, ARTERIAL
Bicarbonate: 26.4 mEq/L — ABNORMAL HIGH (ref 20.0–24.0)
O2 Saturation: 89.2 %
PEEP: 5 cmH2O
Patient temperature: 97.4
TCO2: 24.6 mmol/L (ref 0–100)
pH, Arterial: 7.475 — ABNORMAL HIGH (ref 7.350–7.450)
pO2, Arterial: 56.3 mmHg — ABNORMAL LOW (ref 80.0–100.0)

## 2011-11-28 LAB — CBC
HCT: 25.3 % — ABNORMAL LOW (ref 36.0–46.0)
HCT: 25.5 % — ABNORMAL LOW (ref 36.0–46.0)
Hemoglobin: 8.8 g/dL — ABNORMAL LOW (ref 12.0–15.0)
Hemoglobin: 8.9 g/dL — ABNORMAL LOW (ref 12.0–15.0)
MCH: 30.4 pg (ref 26.0–34.0)
MCV: 86.7 fL (ref 78.0–100.0)
MCV: 87.5 fL (ref 78.0–100.0)
RBC: 2.89 MIL/uL — ABNORMAL LOW (ref 3.87–5.11)
RDW: 15.8 % — ABNORMAL HIGH (ref 11.5–15.5)
WBC: 28.6 10*3/uL — ABNORMAL HIGH (ref 4.0–10.5)
WBC: 29.9 10*3/uL — ABNORMAL HIGH (ref 4.0–10.5)

## 2011-11-28 LAB — TYPE AND SCREEN

## 2011-11-28 LAB — CULTURE, BLOOD (SINGLE)

## 2011-11-28 LAB — CARBOXYHEMOGLOBIN
Carboxyhemoglobin: 0.8 % (ref 0.5–1.5)
Carboxyhemoglobin: 0.9 % (ref 0.5–1.5)
Methemoglobin: 0.9 % (ref 0.0–1.5)
O2 Saturation: 43.3 %
Total hemoglobin: 8.6 g/dL — ABNORMAL LOW (ref 12.0–16.0)

## 2011-11-28 LAB — PROTEIN, BODY FLUID: Total protein, fluid: 0.6 g/dL

## 2011-11-28 LAB — BODY FLUID CELL COUNT WITH DIFFERENTIAL
Eos, Fluid: 0 %
Monocyte-Macrophage-Serous Fluid: 48 % — ABNORMAL LOW (ref 50–90)

## 2011-11-28 LAB — URINE MICROSCOPIC-ADD ON

## 2011-11-28 LAB — FIBRINOGEN: Fibrinogen: 635 mg/dL — ABNORMAL HIGH (ref 204–475)

## 2011-11-28 LAB — URINALYSIS, ROUTINE W REFLEX MICROSCOPIC
Glucose, UA: NEGATIVE mg/dL
Hgb urine dipstick: NEGATIVE
Ketones, ur: NEGATIVE mg/dL
pH: 5.5 (ref 5.0–8.0)

## 2011-11-28 LAB — PROTEIN, TOTAL: Total Protein: 3.7 g/dL — ABNORMAL LOW (ref 6.0–8.3)

## 2011-11-28 LAB — PROCALCITONIN: Procalcitonin: 0.29 ng/mL

## 2011-11-28 LAB — TROPONIN I
Troponin I: <0.3 ng/mL (ref <0.30)
Troponin I: <0.3 ng/mL (ref <0.30)

## 2011-11-28 LAB — LACTATE DEHYDROGENASE, PLEURAL OR PERITONEAL FLUID: LD, Fluid: 60 U/L — ABNORMAL HIGH (ref 3–23)

## 2011-11-28 LAB — MAGNESIUM: Magnesium: 1.3 mg/dL — ABNORMAL LOW (ref 1.5–2.5)

## 2011-11-28 MED ORDER — SODIUM BICARBONATE 8.4 % IV SOLN
INTRAVENOUS | Status: DC
  Filled 2011-11-28: qty 150

## 2011-11-28 MED ORDER — LORAZEPAM 2 MG/ML IJ SOLN
INTRAMUSCULAR | Status: AC
  Filled 2011-11-28: qty 1

## 2011-11-28 MED ORDER — SODIUM CHLORIDE 0.9 % IV BOLUS (SEPSIS)
1000.0000 mL | Freq: Once | INTRAVENOUS | Status: AC
Start: 2011-11-28 — End: 2011-11-28
  Administered 2011-11-28: 1000 mL via INTRAVENOUS

## 2011-11-28 MED ORDER — SODIUM CHLORIDE 0.9 % IV SOLN
100.0000 mg | Freq: Every day | INTRAVENOUS | Status: DC
  Administered 2011-11-28: 100 mg via INTRAVENOUS
  Filled 2011-11-28: qty 100

## 2011-11-28 MED ORDER — DEXTROSE 5 % IV SOLN
2.0000 g | Freq: Once | INTRAVENOUS | Status: AC
  Administered 2011-11-28: 2 g via INTRAVENOUS
  Filled 2011-11-28: qty 2

## 2011-11-28 MED ORDER — HYDROCORTISONE SOD SUCCINATE 100 MG IJ SOLR
50.0000 mg | Freq: Four times a day (QID) | INTRAMUSCULAR | Status: DC
  Administered 2011-11-28 – 2011-11-29 (×6): 50 mg via INTRAVENOUS
  Filled 2011-11-28: qty 2
  Filled 2011-11-28 (×2): qty 1
  Filled 2011-11-28: qty 2
  Filled 2011-11-28 (×5): qty 1
  Filled 2011-11-28 (×2): qty 2
  Filled 2011-11-28: qty 1
  Filled 2011-11-28: qty 2

## 2011-11-28 MED ORDER — IOHEXOL 300 MG/ML  SOLN
100.0000 mL | Freq: Once | INTRAMUSCULAR | Status: AC | PRN
  Administered 2011-11-28: 100 mL via INTRAVENOUS

## 2011-11-28 MED ORDER — DOBUTAMINE IN D5W 4-5 MG/ML-% IV SOLN: 2.5000 ug/kg/min | INTRAVENOUS | Status: DC | PRN

## 2011-11-28 MED ORDER — SODIUM CHLORIDE 0.9 % IV BOLUS (SEPSIS): 500.0000 mL | INTRAVENOUS | Status: DC | PRN

## 2011-11-28 MED ORDER — INSULIN ASPART 100 UNIT/ML ~~LOC~~ SOLN
0.0000 [IU] | SUBCUTANEOUS | Status: DC
  Administered 2011-11-28 – 2011-11-29 (×8): 4 [IU] via SUBCUTANEOUS
  Administered 2011-11-29: 7 [IU] via SUBCUTANEOUS
  Administered 2011-11-30 (×2): 4 [IU] via SUBCUTANEOUS
  Administered 2011-11-30 (×2): 3 [IU] via SUBCUTANEOUS
  Administered 2011-11-30: 4 [IU] via SUBCUTANEOUS
  Administered 2011-11-30: 3 [IU] via SUBCUTANEOUS
  Administered 2011-12-01: 4 [IU] via SUBCUTANEOUS
  Administered 2011-12-01 (×2): 3 [IU] via SUBCUTANEOUS
  Administered 2011-12-01: 4 [IU] via SUBCUTANEOUS
  Administered 2011-12-01 – 2011-12-02 (×6): 3 [IU] via SUBCUTANEOUS
  Administered 2011-12-03 (×2): 4 [IU] via SUBCUTANEOUS
  Administered 2011-12-03 (×2): 3 [IU] via SUBCUTANEOUS
  Administered 2011-12-03: 4 [IU] via SUBCUTANEOUS
  Administered 2011-12-04: 3 [IU] via SUBCUTANEOUS
  Administered 2011-12-04: 4 [IU] via SUBCUTANEOUS
  Administered 2011-12-04 – 2011-12-06 (×6): 3 [IU] via SUBCUTANEOUS
  Administered 2011-12-06: 4 [IU] via SUBCUTANEOUS
  Administered 2011-12-06: 3 [IU] via SUBCUTANEOUS
  Administered 2011-12-07: 4 [IU] via SUBCUTANEOUS
  Administered 2011-12-07 – 2011-12-08 (×4): 3 [IU] via SUBCUTANEOUS
  Administered 2011-12-08 (×2): 4 [IU] via SUBCUTANEOUS
  Administered 2011-12-08: 3 [IU] via SUBCUTANEOUS
  Administered 2011-12-08 – 2011-12-09 (×2): 4 [IU] via SUBCUTANEOUS
  Administered 2011-12-09 (×2): 3 [IU] via SUBCUTANEOUS

## 2011-11-28 MED ORDER — SODIUM BICARBONATE 8.4 % IV SOLN
Freq: Once | INTRAVENOUS | Status: AC
  Administered 2011-11-28: 12:00:00 via INTRAVENOUS
  Filled 2011-11-28: qty 150

## 2011-11-28 MED ORDER — ZINC TRACE METAL 1 MG/ML IV SOLN
INTRAVENOUS | Status: AC
  Administered 2011-11-28: 17:00:00 via INTRAVENOUS
  Filled 2011-11-28: qty 2000

## 2011-11-28 MED ORDER — DEXMEDETOMIDINE HCL IN NACL 400 MCG/100ML IV SOLN
0.2000 ug/kg/h | INTRAVENOUS | Status: DC
  Administered 2011-11-28: 8.8 ug/kg/h via INTRAVENOUS
  Administered 2011-11-28: 1.2 ug/kg/h via INTRAVENOUS
  Administered 2011-11-28: 1 ug/kg/h via INTRAVENOUS
  Administered 2011-11-28: 0.2 ug/kg/h via INTRAVENOUS
  Filled 2011-11-28 (×3): qty 100

## 2011-11-28 MED ORDER — DEXTROSE 5 % IV SOLN
30.0000 ug/min | INTRAVENOUS | Status: DC
  Administered 2011-11-28: 10 ug/min via INTRAVENOUS
  Administered 2011-11-28: 100 ug/min via INTRAVENOUS
  Administered 2011-11-28: 30 ug/min via INTRAVENOUS
  Administered 2011-11-29: 20 ug/min via INTRAVENOUS
  Administered 2011-11-29: 30 ug/min via INTRAVENOUS
  Administered 2011-11-30: 10 ug/min via INTRAVENOUS
  Filled 2011-11-28 (×8): qty 1

## 2011-11-28 MED ORDER — FAT EMULSION 20 % IV EMUL
240.0000 mL | INTRAVENOUS | Status: AC
  Administered 2011-11-28: 240 mL via INTRAVENOUS
  Filled 2011-11-28: qty 250

## 2011-11-28 MED ORDER — DEXTROSE 5 % IV SOLN
30.0000 ug/min | INTRAVENOUS | Status: DC | PRN
  Filled 2011-11-28: qty 1

## 2011-11-28 MED ORDER — NOREPINEPHRINE BITARTRATE 1 MG/ML IJ SOLN
2.0000 ug/min | INTRAVENOUS | Status: DC | PRN
  Filled 2011-11-28: qty 4

## 2011-11-28 MED ORDER — FLUCONAZOLE IN SODIUM CHLORIDE 200-0.9 MG/100ML-% IV SOLN
200.0000 mg | INTRAVENOUS | Status: DC
  Administered 2011-11-28 – 2011-12-03 (×6): 200 mg via INTRAVENOUS
  Filled 2011-11-28 (×6): qty 100

## 2011-11-28 MED ORDER — PROPOFOL 10 MG/ML IV EMUL
5.0000 ug/kg/min | INTRAVENOUS | Status: DC
  Administered 2011-11-28: 5 ug/kg/min via INTRAVENOUS
  Administered 2011-11-29: 40 ug/kg/min via INTRAVENOUS
  Administered 2011-11-29: 45 ug/kg/min via INTRAVENOUS
  Administered 2011-11-29: 15 ug/kg/min via INTRAVENOUS
  Administered 2011-11-29: 40 ug/kg/min via INTRAVENOUS
  Administered 2011-11-29: 45 ug/kg/min via INTRAVENOUS
  Administered 2011-11-30: 55 ug/kg/min via INTRAVENOUS
  Filled 2011-11-28 (×6): qty 100

## 2011-11-28 MED ORDER — DEXTROSE 5 % IV SOLN
2.0000 g | Freq: Two times a day (BID) | INTRAVENOUS | Status: DC
  Filled 2011-11-28: qty 2

## 2011-11-28 NOTE — Progress Notes (Signed)
Name: [name] MRN: [id number] DOB: 04-18-1931    LOS: 7  Referring Provider:  Dr. Gerrit Friends  Reason for Referral:  Septic shock  PULMONARY / CRITICAL CARE MEDICINE  HPI:  78 yo female admitted 11/21/2011 5 days of abdominal pain.  Found to have perforated sigmoid diverticulum with peritonitis, and pelvic abscess.  Had laparotomy 8/31.  Developed respiratory failure and shock post-op.  PCCM consulted 8/31.  She has hx of B cell lymphoma currently on chemo as outpt.  Events Since Admission: 8/31 Laparotomy, sigmoid colectomy and ostomy 9/01 A fib with RVR 9/02 Off pressors 9/06 A fib RVR>>back to sinus; added precedex for agitation; hypotensive.  Subjective:  Hypotensive overnight.  Intermittent A fib.  Tolerating pressure support better this AM with less agitation.  Vital Signs: Temp:  [97.4 F (36.3 C)-99.9 F (37.7 C)] 98.3 F (36.8 C) (09/06 0800) Pulse Rate:  [76-115] 76  (09/06 0357) Resp:  [9-30] 14  (09/06 0800) BP: (61-131)/(26-85) 119/68 mmHg (09/06 0800) SpO2:  [85 %-100 %] 97 % (09/06 0800) FiO2 (%):  [30 %-60 %] 50 % (09/06 0733) Weight:  [133 lb 6.1 oz (60.5 kg)] 133 lb 6.1 oz (60.5 kg) (09/06 0400)  Intake/Output Summary (Last 24 hours) at 11/28/11 0901 Last data filed at 11/28/11 0800  Gross per 24 hour  Intake 5052.93 ml  Output   7000 ml  Net -1947.07 ml   Physical Examination: General: Ill appearing Neuro: Sedated HEENT:  ETT in place Neck:  Supple Cardiovascular: s1s2 regular, no murmur Lungs: decreased breath sounds, no wheeze, basilar rales Abdomen:  Wound dressings clean Musculoskeletal:  No edema Skin:  No rashes.    Dg Chest Port 1 View  11/28/2011  *RADIOLOGY REPORT*  Clinical Data: CHF, follow up  PORTABLE CHEST - 1 VIEW  Comparison: Portable exam 0515 hours compared to 11/27/2011  Findings: Tip of endotracheal tube 2.5 cm above carina. Nasogastric tube extends into stomach. Right side Port-A-Cath, tip projecting over SVC near cavoatrial  junction. Left subclavian central venous catheter tip projecting over cavoatrial junction. Normal heart size. Bilateral airspace infiltrates and layered neural effusions unchanged. No pneumothorax.  IMPRESSION: No interval change.   Original Report Authenticated By: Lollie Marrow, M.D.    Dg Chest Port 1 View  11/27/2011  *RADIOLOGY REPORT*  Clinical Data: Follow up CHF  PORTABLE CHEST - 1 VIEW  Comparison: Most recent prior chest x-ray yesterday, 11/26/2011  Findings: Unchanged support apparatus including endotracheal tube, incompletely visualized nasogastric tube, right IJ portacatheter and left subclavian central venous catheter.  Decreased aeration of the bilateral lung bases with developing opacities and obliteration of the costophrenic angles.  There is pulmonary vascular congestion and mild interstitial edema.  Likely bilateral layering pleural effusions.  IMPRESSION:  1.  Increased pulmonary edema, and bibasilar opacities likely reflecting a combination of enlarging pleural effusions with associated atelectasis. Superimposed infiltrate is difficult to exclude, although this is thought less likely.  2.  Stable and satisfactory support apparatus as above.   Original Report Authenticated By: HEATH      ASSESSMENT AND PLAN  PULMONARY  Lab 11/28/11 0234 11/28/11 0221 11/27/11 2359 11/25/11 0445 11/24/11 0338 11/23/11 1613 11/23/11 0355  PHART -- -- 7.475* 7.444 7.377 7.367 7.374  PCO2ART -- -- 35.9 33.5* 34.1* 34.4* 29.3*  PO2ART -- -- 56.3* 89.1 80.5 69.2* 107.0*  HCO3 -- -- 26.4* 22.7 19.8* 19.3* 16.9*  O2SAT 43.3 38.3 89.2 98.7 97.8 -- --   Ventilator Settings: Vent Mode:  [-]  PRVC FiO2 (%):  [30 %-60 %] 50 % Set Rate:  [12 bmp] 12 bmp Vt Set:  [350 mL] 350 mL PEEP:  [5 cmH20] 5 cmH20 Plateau Pressure:  [9 cmH20-14 cmH20] 9 cmH20  ETT:  8/31 >>   A: Acute respiratory failure in setting of septic shock from peritonitis>>improved vent weaning with precedex. P:   F/u CXR Pressure  support wean as tolerated>>agitation significant barrier to vent weaning Keep in even fluid balance No hx of obstructive lung disease or smoking>>changed BD's to prn  CARDIOVASCULAR  Lab 11/28/11 0240 11/28/11 0230 11/27/11 2350 11/23/11 1611 11/23/11 0415 11/22/11 1259 11/22/11 0330  TROPONINI <0.30 -- 0.38* <0.30 -- -- <0.30  LATICACIDVEN -- 2.1 -- -- 5.4* 5.8* 4.7*  PROBNP -- -- -- -- -- -- --   Lines:  [place] CVL 8/31 >>   Echo 9/01>>periapical akinesis, EF 25 to 30%, diffuse hypokinesis, grade 1 diastolic dysfx, mod AR, mild MR, mod TR, mild/mod RV systolic dysfx, PAS 37 mmHg  A: Septic shock 2nd to peritonitis>>resolved. A fib with RVR>>back in sinus 9/02. Acute systolic CHF>>?if this is sepsis related cardiomyopathy. Hypotension 9/06 more likely related to diuresis and lopressor use rather than recurrent sepsis (she is negative 5 liters since 9/03). P:  Keep CVP > 8 Hold lopressor for now Wean off pressors to keep SBP > 90 May need cardiology to re-assess  RENAL  Lab 11/28/11 0230 11/27/11 2350 11/27/11 1745 11/27/11 0530 11/26/11 1541 11/26/11 1030 11/24/11 0513  NA 131* 132* 134* 138 141 -- --  K 3.8 3.7 -- -- -- -- --  CL 94* 95* 91* 103 106 -- --  CO2 30 31 32 26 26 -- --  BUN 8 8 7 7 10  -- --  CREATININE 0.51 0.51 0.53 0.53 0.59 -- --  CALCIUM 6.7* 6.6* 7.3* 7.2* 7.2* -- --  MG -- 1.3* 1.4* 1.4* -- 1.8 1.9  PHOS -- 2.9 3.3 2.0* -- 2.3 2.2*   Intake/Output      09/05 0701 - 09/06 0700 09/06 0701 - 09/07 0700   I.V. (mL/kg) 980 (16.2) 83.9 (1.4)   Other     IV Piggyback 3014    TPN 1035 30   Total Intake(mL/kg) 5029 (83.1) 113.9 (1.9)   Urine (mL/kg/hr) 6300 (4.3) 500   Emesis/NG output 500 0   Total Output 6800 500   Net -1771 -386.1         Foley:  Placed 11/21/2011  A: Hypokalemia, hypomagnesemia, hypophosphatemia. P:   F/u BMET, electrolytes Monitor renal fx, urine outpt  GASTROINTESTINAL  Lab 11/27/11 2350 11/27/11 0530 11/25/11 0417 11/23/11  0415 11/21/11 1909  AST 13 14 11 15 13   ALT 9 12 8 8 12   ALKPHOS 95 120* 104 54 63  BILITOT 0.2* 0.2* 0.2* 0.2* 0.6  PROT 3.4* 3.9* 3.3* 3.2* 5.1*  ALBUMIN 1.5* 1.7* 1.2* 1.1* 2.5*    A:  Perforated sigmoid diverticulum s/p laparotomy. Nutrition. P:   Post-op care per [facility] TNA started 9/04 per [facility] F/u CT abd/pelvis 9/06>>  HEMATOLOGIC  Lab 11/28/11 0230 11/27/11 2350 11/27/11 0530 11/26/11 0445 11/25/11 0417 11/24/11 0513 11/22/11 1258 11/22/11 0330  HGB 8.9* 8.8* 9.1* 8.3* 8.0* -- -- --  HCT 25.5* 25.3* 26.8* 23.7* 22.7* -- -- --  PLT 158 142* 201 172 121* -- -- --  INR 1.41 -- -- -- -- 1.14 1.40 1.18  APTT 36 -- -- -- -- -- 31 33   A:  Follicular B  cell lymphoma, currently undergoing chemotherapy. Anemia of critical illness, and chronic disease. Thrombocytopenia>>likely related to sepsis/critical illness P:  F/u CBC Transfuse for Hb < 7  INFECTIOUS  Lab 11/28/11 0230 11/27/11 2350 11/27/11 0530 11/26/11 0445 11/25/11 0417 11/23/11 0415 11/22/11 0330  WBC 29.9* 28.6* 23.0* 26.4* 20.3* -- --  PROCALCITON 0.29 -- -- -- -- 2.35 4.18   Cultures: Blood 8/31 >> Urine 8/31 >>negative Peritoneal abscess 8/31 >>  Sputum 9/05 >>  Antibiotics: - Cipro 8/31 >>  - Flagyl 8/31 >>  - fluconazole 8/31 >>   A:  Peritonitis 2nd to sigmoid perforation. Increased respiratory secretions 9/05. Doubt hypotensive event 9/06 was related to sepsis P:   D6/x cipro, flagyl, diflucan Check sputum cx  ENDOCRINE  Lab 11/28/11 0806 11/28/11 0343 11/27/11 2354 11/27/11 1940 11/27/11 1559  GLUCAP 173* 89 121* 191* 160*   A:  Hyperglycemia. P:   SSI  NEUROLOGIC  A:  Sedation>>less agitation with addition of precedex. P:   Continue precedex Limit use of benzo's Fentanyl for pain  BEST PRACTICE / DISPOSITION Level of Care:  ICU Primary Service:  Surgery Consultants: PCCM, Oncology Code Status:  Limited>>no CPR, no defibrillation Diet:  N.p.o. DVT Px:  Lovenox GI Px:   Protonix Skin Integrity:  Good Social / Family: Updated family 9/03  Discussed plan with Dr. Andrey Campanile.  CC time 35 minutes  Coralyn Helling, MD [facility] Pulmonary/Critical Care 11/28/2011, 9:01 AM Pager:  [phone] After 3pm call: [phone]

## 2011-11-28 NOTE — Procedures (Signed)
Thoracentesis Procedure Note  Pre-operative Diagnosis: pleural effusion   Post-operative Diagnosis: same  Indications: weaning   Procedure Details  Consent: Informed consent was obtained. Risks of the procedure were discussed including: infection, bleeding, pain, pneumothorax.  Under sterile conditions the patient was positioned. Betadine solution and sterile drapes were utilized.  1% buffered lidocaine was used to anesthetize the 8 rib space. Fluid was obtained without any difficulties and minimal blood loss.  A dressing was applied to the wound and wound care instructions were provided.   Findings 1000 ml of clear pleural fluid was obtained. A sample was sent to Pathology for cytogenetics, flow, and cell counts, as well as for infection analysis.  Complications:  None; patient tolerated the procedure well.          Condition: stable  Plan A follow up chest x-ray was ordered. Bed Rest for 0 hours. Tylenol 650 mg. for pain.  Anders Simmonds, ACNP  Attending Attestation: I was present for the entire procedure.  Levy Pupa, MD, PhD 11/28/2011, 4:02 PM [facility] [phone] or if no answer [phone]

## 2011-11-28 NOTE — Progress Notes (Signed)
6 Days Post-Op  Subjective: Went back into afib last pm - amio restarted Became hypoTN - Neo started Started on precedex for agitation  Objective: Vital signs in last 24 hours: Temp:  [97.4 F (36.3 C)-99.9 F (37.7 C)] 97.9 F (36.6 C) (09/06 0400) Pulse Rate:  [76-115] 76  (09/06 0357) Resp:  [9-30] 13  (09/06 0700) BP: (61-131)/(26-85) 121/61 mmHg (09/06 0700) SpO2:  [85 %-100 %] 100 % (09/06 0700) FiO2 (%):  [30 %-60 %] 50 % (09/06 0733) Weight:  [133 lb 6.1 oz (60.5 kg)] 133 lb 6.1 oz (60.5 kg) (09/06 0400)    Intake/Output from previous day: 09/05 0701 - 09/06 0700 In: 4969 [I.V.:920; IV Piggyback:3014; TPN:1035] Out: 6800 [Urine:6300; Emesis/NG output:500] Intake/Output this shift: Total I/O In: 68.9 [I.V.:38.9; TPN:30] Out: -   Intubated, sedated abd soft, nd, ostomy viable - trace liquid stool in bag. Incision ok.- left open in several places; OG - 500cc green No edema  Lab Results:   Basename 11/28/11 0230 11/27/11 2350  WBC 29.9* 28.6*  HGB 8.9* 8.8*  HCT 25.5* 25.3*  PLT 158 142*   BMET  Basename 11/28/11 0230 11/27/11 2350  NA 131* 132*  K 3.8 3.7  CL 94* 95*  CO2 30 31  GLUCOSE 98 128*  BUN 8 8  CREATININE 0.51 0.51  CALCIUM 6.7* 6.6*   PT/INR  Basename 11/28/11 0230  LABPROT 17.5*  INR 1.41   ABG  Basename 11/27/11 2359  PHART 7.475*  HCO3 26.4*    Studies/Results: Dg Chest Port 1 View  11/28/2011  *RADIOLOGY REPORT*  Clinical Data: CHF, follow up  PORTABLE CHEST - 1 VIEW  Comparison: Portable exam 0515 hours compared to 11/27/2011  Findings: Tip of endotracheal tube 2.5 cm above carina. Nasogastric tube extends into stomach. Right side Port-A-Cath, tip projecting over SVC near cavoatrial junction. Left subclavian central venous catheter tip projecting over cavoatrial junction. Normal heart size. Bilateral airspace infiltrates and layered neural effusions unchanged. No pneumothorax.  IMPRESSION: No interval change.   Original Report  Authenticated By: Lollie Marrow, M.D.    Dg Chest Port 1 View  11/27/2011  *RADIOLOGY REPORT*  Clinical Data: Follow up CHF  PORTABLE CHEST - 1 VIEW  Comparison: Most recent prior chest x-ray yesterday, 11/26/2011  Findings: Unchanged support apparatus including endotracheal tube, incompletely visualized nasogastric tube, right IJ portacatheter and left subclavian central venous catheter.  Decreased aeration of the bilateral lung bases with developing opacities and obliteration of the costophrenic angles.  There is pulmonary vascular congestion and mild interstitial edema.  Likely bilateral layering pleural effusions.  IMPRESSION:  1.  Increased pulmonary edema, and bibasilar opacities likely reflecting a combination of enlarging pleural effusions with associated atelectasis. Superimposed infiltrate is difficult to exclude, although this is thought less likely.  2.  Stable and satisfactory support apparatus as above.   Original Report Authenticated By: Vilma Prader     Anti-infectives: Anti-infectives     Start     Dose/Rate Route Frequency Ordered Stop   11/28/11 1200   aztreonam (AZACTAM) 2 g in dextrose 5 % 50 mL IVPB        2 g 100 mL/hr over 30 Minutes Intravenous Every 12 hours 11/28/11 0240     11/28/11 0200   micafungin (MYCAMINE) 100 mg in sodium chloride 0.9 % 100 mL IVPB     Comments: Pharmacy to verify the dose      100 mg 100 mL/hr over 1 Hours Intravenous Daily at bedtime 11/28/11  0133     11/28/11 0130   aztreonam (AZACTAM) 2 g in dextrose 5 % 50 mL IVPB        2 g 100 mL/hr over 30 Minutes Intravenous  Once 11/28/11 0130 11/28/11 0230   11/23/11 0300   fluconazole (DIFLUCAN) IVPB 200 mg  Status:  Discontinued        200 mg 100 mL/hr over 60 Minutes Intravenous Every 24 hours 11/22/11 0254 11/26/11 1442   11/22/11 0300   fluconazole (DIFLUCAN) IVPB 400 mg        400 mg 200 mL/hr over 60 Minutes Intravenous  Once 11/22/11 0254 11/22/11 0518   11/22/11 0230   metroNIDAZOLE (FLAGYL)  IVPB 500 mg  Status:  Discontinued        500 mg 100 mL/hr over 60 Minutes Intravenous  Once 11/22/11 0229 11/22/11 0230   11/21/11 2230   ciprofloxacin (CIPRO) IVPB 400 mg        400 mg 200 mL/hr over 60 Minutes Intravenous 2 times daily 11/21/11 2207     11/21/11 2230   metroNIDAZOLE (FLAGYL) IVPB 500 mg        500 mg 100 mL/hr over 60 Minutes Intravenous 3 times per day 11/21/11 2207     11/21/11 2100   meropenem (MERREM) 1 g in sodium chloride 0.9 % 100 mL IVPB  Status:  Discontinued        1 g 200 mL/hr over 30 Minutes Intravenous 3 times per day 11/21/11 2013 11/21/11 2146          Assessment/Plan: s/p Procedure(s) (LRB) with comments: EXPLORATORY LAPAROTOMY (N/A) - drainage of pelvic abscess COLON RESECTION SIGMOID (N/A) COLOSTOMY (N/A) - desending colostomy  Worsening sepsis picture. Agree with broadening coverage. Doubt Cdiff. Non-contrasted CT of abd/pelvis will be of little yield. Spoke with CCM attending. Changed to CT abd/pelvis with oral-iv contrast. uop ok. Cr ok. But on pressor. Ordered SodiumBicarb gtt on call for CT for renal protection  Cont NPO. Cont TPN  Will follow  Mary Sella. Andrey Campanile, MD, FACS General, Bariatric, & Minimally Invasive Surgery [facility], [place]   LOS: 7 days    Atilano Ina 11/28/2011

## 2011-11-28 NOTE — Progress Notes (Signed)
PARENTERAL NUTRITION CONSULT NOTE   Pharmacy Consult for TPN Indication: Prolonged ileus  Allergies  Allergen Reactions  . Codeine Hives  . Other Nausea And Vomiting    anesthesia  . Penicillins Hives  . Sulfa Antibiotics Hives   Patient Measurements: Height: 4\' 11"  (149.9 cm) Weight: 133 lb 6.1 oz (60.5 kg) IBW/kg (Calculated) : 43.2  Adjusted Body Weight: 50.4  Vital Signs: Temp: 98.3 F (36.8 C) (09/06 0800) Temp src: Axillary (09/06 0800) BP: 119/68 mmHg (09/06 0800) Pulse Rate: 76  (09/06 0357) Intake/Output from previous day: 09/05 0701 - 09/06 0700 In: 5029 [I.V.:980; IV Piggyback:3014; TPN:1035] Out: 6800 [Urine:6300; Emesis/NG output:500]  Labs:  [facility] 11/28/11 0230 11/27/11 2350 11/27/11 0530  WBC 29.9* 28.6* 23.0*  HGB 8.9* 8.8* 9.1*  HCT 25.5* 25.3* 26.8*  PLT 158 142* 201  APTT 36 -- --  INR 1.41 -- --    Basename 11/28/11 0230 11/27/11 2350 11/27/11 1745 11/27/11 0530 11/26/11 1030  NA 131* 132* 134* -- --  K 3.8 3.7 3.1* -- --  CL 94* 95* 91* -- --  CO2 30 31 32 -- --  GLUCOSE 98 128* 182* -- --  BUN 8 8 7  -- --  CREATININE 0.51 0.51 0.53 -- --  LABCREA -- -- -- -- --  CREAT24HRUR -- -- -- -- --  CALCIUM 6.7* 6.6* 7.3* -- --  MG -- 1.3* 1.4* 1.4* --  PHOS -- 2.9 3.3 2.0* --  PROT -- 3.4* -- 3.9* --  ALBUMIN -- 1.5* -- 1.7* --  AST -- 13 -- 14 --  ALT -- 9 -- 12 --  ALKPHOS -- 95 -- 120* --  BILITOT -- 0.2* -- 0.2* --  BILIDIR -- -- -- -- --  IBILI -- -- -- -- --  PREALBUMIN -- -- -- -- 11.7*  TRIG -- -- -- 97 --  CHOLHDL -- -- -- -- --  CHOL -- -- -- 72 --   Estimated Creatinine Clearance: 46.6 ml/min (by C-G formula based on Cr of 0.51).    Basename 11/28/11 0806 11/28/11 0343 11/27/11 2354  GLUCAP 173* 89 121*   CBGs & Insulin requirements past 24 hours:   CBGs: 89-191  Moderate SSI Q4h: 19 units given  Nutritional Goals:   RD recs 11/26/11: 1468 kcals, 80-90 gm protein, daily  Clinimix 5/20 goal rate of 60 ml/hr (+  Lipids MWF) to provide: 72 g/day protein and an average of 1472 Kcal/day (1746 Kcal/day MWF, 1267 Kcal/day STTHS).  Current nutrition:   Diet: NPO  [facility]F: [facility] at [facility]  Assessment:   78 yo F admit on 8/30 w/ ruptured sigmoid diverticulum, peritonitis, and pelvic abscess. S/p laparotomy 8/31.  TNA started 9/4 for prolonged ileus.  High risk of refeeding syndrome - patient with low K, Mag and Phos today.  Glucose became elevated after starting TPN - currently on sensitive SSI.   CT abd/pelvis 9/6, pending  Renal/hepatic function:  WNL  Electrolytes: plan to keep K > 4 for ileus, Na and Cl slightly low (unable to change in Clinimix bag). Mag and phos replaced 9/5.  Corrected Ca WNL.  Pre-Albumin: 11.7 (9/4)  TG/Cholesterol: wnl 9/5  GI prophylaxis:  pantoprazole  Plan:  At 1800 tonight Advance Clinimix E 5/20 to goal of 60 ml/hr.  TNA to contain fat emulsion, standard multivitamins and trace elements MWF only due to ongoing shortage Continue IVF at [facility]  Change SSI to resistant scale, monitor CBGs, may need to add insulin in TNA. TNA  lab panels on Mondays & Thursdays. F/u daily.  Geoffry Paradise, PharmD, BCPS Pager: [phone] 9:29 AM Pharmacy #: [phone]

## 2011-11-28 NOTE — Progress Notes (Signed)
The patient with increased WBC, hypotension, persistent despite fluid resuscitation.   Sepsis/septic shock protocol re instituted. Micafungin and Aztreonam added to the antibiotic regimen. Cultures obtained and CT abdomen pending.

## 2011-11-28 NOTE — Progress Notes (Signed)
Attempted arterial line twice without success. Able to get blood each time, but unable to thread catheter. Spoke with Dr Blima Dessert about it and he said not to worry about it.  Jacqulynn Cadet RRT

## 2011-11-29 ENCOUNTER — Inpatient Hospital Stay (HOSPITAL_COMMUNITY): Payer: Medicare Other

## 2011-11-29 LAB — BASIC METABOLIC PANEL
BUN: 11 mg/dL (ref 6–23)
CO2: 30 mEq/L (ref 19–32)
Calcium: 7.5 mg/dL — ABNORMAL LOW (ref 8.4–10.5)
Creatinine, Ser: 0.41 mg/dL — ABNORMAL LOW (ref 0.50–1.10)

## 2011-11-29 LAB — CBC
MCH: 30.3 pg (ref 26.0–34.0)
MCHC: 34.4 g/dL (ref 30.0–36.0)
MCV: 88 fL (ref 78.0–100.0)
Platelets: 197 10*3/uL (ref 150–400)
RDW: 15.8 % — ABNORMAL HIGH (ref 11.5–15.5)
WBC: 36.5 10*3/uL — ABNORMAL HIGH (ref 4.0–10.5)

## 2011-11-29 LAB — GLUCOSE, CAPILLARY
Glucose-Capillary: 127 mg/dL — ABNORMAL HIGH (ref 70–99)
Glucose-Capillary: 161 mg/dL — ABNORMAL HIGH (ref 70–99)
Glucose-Capillary: 161 mg/dL — ABNORMAL HIGH (ref 70–99)
Glucose-Capillary: 177 mg/dL — ABNORMAL HIGH (ref 70–99)
Glucose-Capillary: 186 mg/dL — ABNORMAL HIGH (ref 70–99)
Glucose-Capillary: 207 mg/dL — ABNORMAL HIGH (ref 70–99)

## 2011-11-29 LAB — CULTURE, RESPIRATORY W GRAM STAIN

## 2011-11-29 LAB — CHOLESTEROL, BODY FLUID: Cholesterol, Fluid: 5 mg/dL

## 2011-11-29 MED ORDER — HYDROCORTISONE SOD SUCCINATE 100 MG IJ SOLR
50.0000 mg | Freq: Three times a day (TID) | INTRAMUSCULAR | Status: DC
  Administered 2011-11-29 – 2011-11-30 (×3): 50 mg via INTRAVENOUS
  Filled 2011-11-29 (×3): qty 1

## 2011-11-29 MED ORDER — DEXTROSE 5 % IV SOLN
2.0000 g | Freq: Three times a day (TID) | INTRAVENOUS | Status: DC
  Administered 2011-11-29 – 2011-12-07 (×24): 2 g via INTRAVENOUS
  Filled 2011-11-29 (×25): qty 2

## 2011-11-29 MED ORDER — SODIUM CHLORIDE 0.9 % IV SOLN
INTRAVENOUS | Status: DC
  Administered 2011-12-01: 20 mL via INTRAVENOUS

## 2011-11-29 MED ORDER — CLINIMIX E/DEXTROSE (5/20) 5 % IV SOLN
INTRAVENOUS | Status: AC
  Administered 2011-11-29: 17:00:00 via INTRAVENOUS
  Filled 2011-11-29: qty 2000

## 2011-11-29 MED ORDER — SODIUM CHLORIDE 0.9 % IV SOLN: INTRAVENOUS | Status: DC

## 2011-11-29 MED ORDER — POTASSIUM CHLORIDE 10 MEQ/50ML IV SOLN
10.0000 meq | INTRAVENOUS | Status: AC
  Administered 2011-11-29 (×4): 10 meq via INTRAVENOUS
  Filled 2011-11-29 (×4): qty 50

## 2011-11-29 NOTE — Progress Notes (Signed)
ANTIBIOTIC CONSULT NOTE - INITIAL  Pharmacy Consult for Aztreonam Indication: peritonitis secondary to sigmoid perforation, increasing WBC, questionable pneumonia  Allergies  Allergen Reactions  . Codeine Hives  . Other Nausea And Vomiting    anesthesia  . Penicillins Hives  . Sulfa Antibiotics Hives    Patient Measurements: Height: 4\' 11"  (149.9 cm) Weight: 133 lb 6.1 oz (60.5 kg) IBW/kg (Calculated) : 43.2  Adjusted Body Weight:   Vital Signs: Temp: 98.4 F (36.9 C) (09/07 0800) Temp src: Oral (09/07 0800) BP: 107/57 mmHg (09/07 0700) Intake/Output from previous day: 09/06 0701 - 09/07 0700 In: 4410 [I.V.:1540; IV Piggyback:800; TPN:1110] Out: 4975 [Urine:3125; Emesis/NG output:850] Intake/Output from this shift:    Labs:  Basename 11/29/11 0500 11/28/11 0230 11/27/11 2350  WBC 36.5* 29.9* 28.6*  HGB 8.6* 8.9* 8.8*  PLT 197 158 142*  LABCREA -- -- --  CREATININE 0.41* 0.51 0.51   Estimated Creatinine Clearance: 46.6 ml/min (by C-G formula based on Cr of 0.41). No results found for this basename: VANCOTROUGH:2,VANCOPEAK:2,VANCORANDOM:2,GENTTROUGH:2,GENTPEAK:2,GENTRANDOM:2,TOBRATROUGH:2,TOBRAPEAK:2,TOBRARND:2,AMIKACINPEAK:2,AMIKACINTROU:2,AMIKACIN:2, in the last 72 hours   Microbiology: Recent Results (from the past 720 hour(s))  CULTURE, BLOOD (SINGLE)     Status: Normal   Collection Time   11/21/11  9:33 PM      Component Value Range Status Comment   Specimen Description BLOOD RIGHT ARM   Final    Special Requests BOTTLES DRAWN AEROBIC AND ANAEROBIC   Final    Culture  Setup Time 11/22/2011 05:00   Final    Culture NO GROWTH 5 DAYS   Final    Report Status 11/28/2011 FINAL   Final   MRSA PCR SCREENING     Status: Normal   Collection Time   11/22/11  1:04 AM      Component Value Range Status Comment   MRSA by PCR NEGATIVE  NEGATIVE Final   CULTURE, BLOOD (ROUTINE X 2)     Status: Normal   Collection Time   11/22/11  3:30 AM      Component Value Range  Status Comment   Specimen Description BLOOD LEFT ANTECUBITAL   Final    Special Requests BOTTLES DRAWN AEROBIC AND ANAEROBIC 5CC   Final    Culture  Setup Time 11/22/2011 11:22   Final    Culture NO GROWTH 5 DAYS   Final    Report Status 11/28/2011 FINAL   Final   URINE CULTURE     Status: Normal   Collection Time   11/22/11  4:05 AM      Component Value Range Status Comment   Specimen Description URINE, CATHETERIZED   Final    Special Requests NONE   Final    Culture  Setup Time 11/22/2011 11:21   Final    Colony Count NO GROWTH   Final    Culture NO GROWTH   Final    Report Status 11/23/2011 FINAL   Final   CULTURE, RESPIRATORY     Status: Normal (Preliminary result)   Collection Time   11/27/11 11:52 AM      Component Value Range Status Comment   Specimen Description TRACHEAL ASPIRATE   Final    Special Requests Normal   Final    Gram Stain     Final    Value: RARE WBC PRESENT,BOTH PMN AND MONONUCLEAR     NO SQUAMOUS EPITHELIAL CELLS SEEN     NO ORGANISMS SEEN   Culture NO GROWTH   Final    Report Status PENDING  Incomplete   URINE CULTURE     Status: Normal (Preliminary result)   Collection Time   11/28/11  1:10 AM      Component Value Range Status Comment   Specimen Description Urine   Final    Special Requests Normal   Final    Culture  Setup Time 11/28/2011 08:47   Final    Colony Count 15,000 COLONIES/ML   Final    Culture GRAM NEGATIVE RODS   Final    Report Status PENDING   Incomplete   CLOSTRIDIUM DIFFICILE BY PCR     Status: Normal   Collection Time   11/28/11  1:10 AM      Component Value Range Status Comment   C difficile by pcr NEGATIVE  NEGATIVE Final   BODY FLUID CULTURE     Status: Normal (Preliminary result)   Collection Time   11/28/11  3:36 PM      Component Value Range Status Comment   Specimen Description PLEURAL   Final    Special Requests Normal   Final    Gram Stain     Final    Value: NO WBC SEEN     NO ORGANISMS SEEN   Culture PENDING    Incomplete    Report Status PENDING   Incomplete     Medical History: Past Medical History  Diagnosis Date  . Cancer 07/08/11    follicular B cell lymphoma  . TIA (transient ischemic attack)   . Arthritis   . Hypertension   . Esophageal abnormality     strictures  . Arthritis 09/04/2011  . Dehydration 11/20/2011  . Diarrhea 11/20/2011  . Complication of anesthesia   . PONV (postoperative nausea and vomiting)     in the 1980's  . GERD (gastroesophageal reflux disease)   . Esophageal stricture     x 2    Medications:  Prescriptions prior to admission  Medication Sig Dispense Refill  . acetaminophen (TYLENOL) 500 MG tablet Take 500 mg by mouth every 6 (six) hours as needed. For pain      . allopurinol (ZYLOPRIM) 100 MG tablet Take 100 mg by mouth 2 (two) times daily.      [name] amLODipine-atorvastatin (CADUET) 5-40 MG per tablet Take 1 tablet by mouth daily.      . clopidogrel (PLAVIX) 75 MG tablet Take 75 mg by mouth daily.      . famotidine (PEPCID) 10 MG tablet Take 10 mg by mouth daily.      [name] lactulose (CHRONULAC) 10 GM/15ML solution Take 10 g by mouth 3 (three) times daily.      [name] lidocaine-prilocaine (EMLA) cream Apply 1 application topically as needed. Pain      . loratadine (CLARITIN) 10 MG tablet Take 10 mg by mouth as needed. Allergies      . metoprolol (LOPRESSOR) 100 MG tablet Take 50 mg by mouth 2 (two) times daily.       . predniSONE (DELTASONE) 20 MG tablet Take 160 mg by mouth daily. Take daily for 5 days      . pregabalin (LYRICA) 25 MG capsule Take 25 mg by mouth at bedtime.      . prochlorperazine (COMPAZINE) 10 MG tablet Take 10 mg by mouth every 6 (six) hours as needed. Nausea      . valsartan (DIOVAN) 160 MG tablet Take 160 mg by mouth daily.       Scheduled:    . antiseptic oral rinse  15 mL Mouth  Rinse QID  . aztreonam  2 g Intravenous Q8H  . chlorhexidine  15 mL Mouth Rinse BID  . enoxaparin (LOVENOX) injection  40 mg Subcutaneous Q24H  . fluconazole  (DIFLUCAN) IV  200 mg Intravenous Q24H  . hydrocortisone sodium succinate  50 mg Intravenous Q8H  . insulin aspart  0-20 Units Subcutaneous Q4H  . LORazepam      . metronidazole  500 mg Intravenous Q8H  . pantoprazole (PROTONIX) IV  40 mg Intravenous QHS  . potassium chloride  10 mEq Intravenous Q1 Hr x 4  .  sodium bicarbonate infusion 1000 mL   Intravenous Once  . DISCONTD: ciprofloxacin  400 mg Intravenous BID  . DISCONTD: hydrocortisone sodium succinate  50 mg Intravenous Q6H  . DISCONTD: insulin aspart  0-15 Units Subcutaneous Q4H   Infusions:    . sodium chloride 20 mL (11/27/11 1427)  . TPN (CLINIMIX) +/- additives 60 mL/hr at 11/28/11 1725   And  . fat emulsion 240 mL (11/28/11 1724)  . fentaNYL infusion INTRAVENOUS 100 mcg/hr (11/28/11 1922)  . phenylephrine (NEO-SYNEPHRINE) Adult infusion 30 mcg/min (11/29/11 1610)  . propofol 45 mcg/kg/min (11/29/11 0706)  .  sodium bicarbonate infusion 1000 mL 60 mL/hr at 11/28/11 1310  . TPN (CLINIMIX) +/- additives 40 mL/hr at 11/27/11 1704  . TPN (CLINIMIX) +/- additives    . DISCONTD: dexmedetomidine Stopped (11/28/11 2041)   Assessment: 78 yo F on currently on D9 flagyl/cipro/fluconazole for peritonitis secondary to sigmoid perforation. Cipro d/ced per MD today 9/7. Also with questionable PNA. WBC count increasing.   Plan:  1. Start aztreonam 2 g IV q 8 hours 2. Will adjust accordingly for renal function 3. Will continue to follow clinical picture   Cressida Milford, Lorra Hals, PharmD 11/29/2011,9:13 AM

## 2011-11-29 NOTE — Progress Notes (Signed)
7 Days Post-Op  Subjective: Intubated, sedated, and critically ill.  Objective: Vital signs in last 24 hours: Temp:  [97.2 F (36.2 C)-98.3 F (36.8 C)] 97.5 F (36.4 C) (09/07 0400) Resp:  [13-38] 25  (09/07 0700) BP: (56-140)/(28-81) 107/57 mmHg (09/07 0700) SpO2:  [90 %-100 %] 100 % (09/07 0700) FiO2 (%):  [30 %-50 %] 30 % (09/07 0340) Last BM Date: 11/28/11 (Scant amount of black, liquid stool in colostomy.)  Intake/Output from previous day: 09/06 0701 - 09/07 0700 In: 4410 [I.V.:1540; IV Piggyback:800; TPN:1110] Out: 4975 [Urine:3125; Emesis/NG output:850] Intake/Output this shift:    Resp: diminished breath sounds bilaterally GI: soft, nontender. ostomy pink. wound clean  Lab Results:   Basename 11/29/11 0500 11/28/11 0230  WBC 36.5* 29.9*  HGB 8.6* 8.9*  HCT 25.0* 25.5*  PLT 197 158   BMET  Basename 11/29/11 0500 11/28/11 0230  NA 135 131*  K 3.6 3.8  CL 97 94*  CO2 30 30  GLUCOSE 146* 98  BUN 11 8  CREATININE 0.41* 0.51  CALCIUM 7.5* 6.7*   PT/INR  Basename 11/28/11 0230  LABPROT 17.5*  INR 1.41   ABG  Basename 11/27/11 2359  PHART 7.475*  HCO3 26.4*    Studies/Results: Ct Head Wo Contrast  11/28/2011  *RADIOLOGY REPORT*  Clinical Data: Altered mental status  CT HEAD WITHOUT CONTRAST  Technique:  Contiguous axial images were obtained from the base of the skull through the vertex without contrast.  Comparison: None.  Findings: No acute intracranial hemorrhage.  No focal mass lesion. No CT evidence of acute infarction.   No midline shift or mass effect.  No hydrocephalus.  Basilar cisterns are patent.  Mild periventricular subcortical white matter hypodensities. Paranasal sinuses and mastoid air cells are clear.  Orbits are normal.  IMPRESSION: No acute intracranial findings.  Mild microvascular disease.   Original Report Authenticated By: Genevive Bi, M.D.    Ct Abdomen Pelvis W Contrast  11/28/2011  *RADIOLOGY REPORT*  Clinical Data:  Postoperative day #6 for exploratory laparotomy. Colectomy.  Colostomy.  Now hypotensive.  Worsened white blood cell count.  Surgery for diverticular abscess.  Pelvic abscess with drain placement 11/22/2011.  Lymphoma diagnosed in April. Chemotherapy.  CT ABDOMEN AND PELVIS WITH CONTRAST  Technique:  Multidetector CT imaging of the abdomen and pelvis was performed following the standard protocol during bolus administration of intravenous contrast.  Contrast: OMNIPAQUE IOHEXOL 300 MG/ML  SOLN  Comparison: 11/21/2011.  Findings: Degradation secondary patient arm position and minimal motion.  Bibasilar collapse / consolidative change.  Patchy right middle lobe airspace disease.  Mild cardiomegaly.  Moderate right and small to moderate left-sided pleural effusions.  Hepatic dome hypoattenuating lesions are similar to 11/11/2011 and were most consistent with cysts on that exam.  There is also subcapsular cyst in the right lobe of the liver more inferiorly.  Normal spleen.  The stomach is contrast filled, with the nasogastric terminating at gastric antrum.  Suspect a large descending duodenal diverticulum, present on prior exams.  Normal pancreas.  Gallbladder fundal stones versus mild focal wall thickening on image 46. Concurrent dependent stone.  The gallbladder measures 9.0 cm maximally without surrounding inflammation.  No gross biliary ductal dilatation.  Normal adrenal glands and kidneys.  No retroperitoneal or retrocrural adenopathy.  Rectal stump/short Hartmann's pouch creation.  Descending colostomy in the left lower quadrant.  Normal caliber of the remainder of the colon.  Normal caliber of the small bowel loops. No pneumatosis or free intraperitoneal air.  Small volume ascites within the left side of the abdomen on image 62 and extending into the left  pelvis.  No abdominal fluid collection/abscess.  Pelvic anasarca.  Air and a Foley catheter within urinary bladder.  Hysterectomy. Probable residual left  ovarian tissue on image 75.  There is interloop fluid which measures 2.2 x 3.2 cm in the small bowel mesentery of the pelvis on image 68.  This is contiguous with a left paracentral pelvic fluid collection which measures 3.1 x 4.1 cm on image 74.  There is subtle contiguous peritoneal thickening or enhancement in the left side of the cul-de-sac on images 78 - 80.  Midline pelvic laparotomy changes. No acute osseous abnormality.  IMPRESSION: Surgical changes of descending colostomy and short Hartmann's pouch/rectal stump.  Ill-defined pelvic interloop fluid with subtle cul-de-sac peritoneal thickening or enhancement.  Cannot exclude infected ascites or developing early abscess. 2.  Gallstones with borderline gallbladder distention.  No specific evidence of acute cholecystitis.  Correlate with right upper quadrant symptoms and consider abdominal ultrasound. 3.  Right greater left pleural effusions with adjacent atelectasis or infection.  Concurrent right middle lobe infection. 4.  Multifactorial degradation.   Original Report Authenticated By: Consuello Bossier, M.D.    Dg Chest Port 1 View  11/28/2011  *RADIOLOGY REPORT*  Clinical Data: Status post right-sided thoracentesis.  PORTABLE CHEST - 1 VIEW  Comparison: Chest x-ray 11/28/2011.  Findings: An endotracheal tube is in place with tip 4.8 cm above the carina. A nasogastric tube is seen extending into the stomach, however, the tip of the nasogastric tube extends below the lower margin of the image. There is a left-sided subclavian central venous catheter with tip terminating in the superior aspect of the right atrium. Right-sided internal jugular single lumen Port-A-Cath with tip terminating at the superior cavoatrial junction.  The lung volumes remain low.  However, compared to the recent prior study there is significantly improved aeration throughout the right hemithorax, which appears to reflects near complete resolution of the right-sided pleural effusion. Small  amount of ill-defined air space disease in the right suprahilar and perihilar region is unchanged.  Moderate left-sided pleural effusion and left basilar atelectasis and/or consolidation is unchanged.  Mild pulmonary venous congestion without frank pulmonary edema.  Mild cardiomegaly is unchanged.  Mediastinal contours are unremarkable. Atherosclerosis in the thoracic aorta.  IMPRESSION: 1.  Support apparatus, as above. 2.  Near complete resolution of right-sided pleural effusion with improved aeration in the right lung.  Radiographic appearance of the chest is otherwise essentially unchanged, as above.   Original Report Authenticated By: Florencia Reasons, M.D.    Dg Chest Port 1 View  11/28/2011  *RADIOLOGY REPORT*  Clinical Data: CHF, follow up  PORTABLE CHEST - 1 VIEW  Comparison: Portable exam 0515 hours compared to 11/27/2011  Findings: Tip of endotracheal tube 2.5 cm above carina. Nasogastric tube extends into stomach. Right side Port-A-Cath, tip projecting over SVC near cavoatrial junction. Left subclavian central venous catheter tip projecting over cavoatrial junction. Normal heart size. Bilateral airspace infiltrates and layered neural effusions unchanged. No pneumothorax.  IMPRESSION: No interval change.   Original Report Authenticated By: Lollie Marrow, M.D.     Anti-infectives: Anti-infectives     Start     Dose/Rate Route Frequency Ordered Stop   11/28/11 1200   aztreonam (AZACTAM) 2 g in dextrose 5 % 50 mL IVPB  Status:  Discontinued        2 g 100 mL/hr over 30 Minutes  Intravenous Every 12 hours 11/28/11 0240 11/28/11 0903   11/28/11 1000   fluconazole (DIFLUCAN) IVPB 200 mg        200 mg 100 mL/hr over 60 Minutes Intravenous Every 24 hours 11/28/11 0915     11/28/11 0200   micafungin (MYCAMINE) 100 mg in sodium chloride 0.9 % 100 mL IVPB  Status:  Discontinued     Comments: Pharmacy to verify the dose      100 mg 100 mL/hr over 1 Hours Intravenous Daily at bedtime 11/28/11 0133  11/28/11 0903   11/28/11 0130   aztreonam (AZACTAM) 2 g in dextrose 5 % 50 mL IVPB        2 g 100 mL/hr over 30 Minutes Intravenous  Once 11/28/11 0130 11/28/11 0230   11/23/11 0300   fluconazole (DIFLUCAN) IVPB 200 mg  Status:  Discontinued        200 mg 100 mL/hr over 60 Minutes Intravenous Every 24 hours 11/22/11 0254 11/26/11 1442   11/22/11 0300   fluconazole (DIFLUCAN) IVPB 400 mg        400 mg 200 mL/hr over 60 Minutes Intravenous  Once 11/22/11 0254 11/22/11 0518   11/22/11 0230   metroNIDAZOLE (FLAGYL) IVPB 500 mg  Status:  Discontinued        500 mg 100 mL/hr over 60 Minutes Intravenous  Once 11/22/11 0229 11/22/11 0230   11/21/11 2230   ciprofloxacin (CIPRO) IVPB 400 mg        400 mg 200 mL/hr over 60 Minutes Intravenous 2 times daily 11/21/11 2207     11/21/11 2230   metroNIDAZOLE (FLAGYL) IVPB 500 mg        500 mg 100 mL/hr over 60 Minutes Intravenous 3 times per day 11/21/11 2207     11/21/11 2100   meropenem (MERREM) 1 g in sodium chloride 0.9 % 100 mL IVPB  Status:  Discontinued        1 g 200 mL/hr over 30 Minutes Intravenous 3 times per day 11/21/11 2013 11/21/11 2146          Assessment/Plan: s/p Procedure(s) (LRB) with comments: EXPLORATORY LAPAROTOMY (N/A) - drainage of pelvic abscess COLON RESECTION SIGMOID (N/A) COLOSTOMY (N/A) - desending colostomy On cipro and flagyl. Recent CT was not very impressive for residual fluid. It did show a pneumonia. I wonder if we should broaden the antibiotic coverage given the continued increase in her WBC Critical care per CCM Continue npo and tpn  LOS: 8 days    TOTH III,PAUL S 11/29/2011

## 2011-11-29 NOTE — Progress Notes (Signed)
Name: [name] MRN: [id number] DOB: [birth year]    LOS: 8  Referring Provider:  Dr. Gerrit Friends  Reason for Referral:  Septic shock  PULMONARY / CRITICAL CARE MEDICINE  HPI:  78 yo female admitted 11/21/2011 5 days of abdominal pain.  Found to have perforated sigmoid diverticulum with peritonitis, and pelvic abscess.  Had laparotomy 8/31.  Developed respiratory failure and shock post-op.  PCCM consulted 8/31.  She has hx of B cell lymphoma currently on chemo as outpt.  Events Since Admission: 8/31 Laparotomy, sigmoid colectomy and ostomy 9/01 A fib with RVR 9/02 Off pressors 9/06 A fib RVR>>back to sinus; added precedex for agitation; hypotensive.  Rt thoracentesis (transudate).  Subjective:  Transitioned back to diprivan overnight.  Remains on low dose phenylephrine.  Vital Signs: Temp:  [97.2 F (36.2 C)-98.3 F (36.8 C)] 97.5 F (36.4 C) (09/07 0400) Resp:  [13-38] 25  (09/07 0700) BP: (56-140)/(28-81) 107/57 mmHg (09/07 0700) SpO2:  [90 %-100 %] 100 % (09/07 0700) FiO2 (%):  [30 %-50 %] 30 % (09/07 0800)  Intake/Output Summary (Last 24 hours) at 11/29/11 0825 Last data filed at 11/29/11 0600  Gross per 24 hour  Intake 4286.05 ml  Output   4475 ml  Net -188.95 ml   Physical Examination: General: Ill appearing Neuro: Sedated HEENT:  ETT in place Neck:  Supple Cardiovascular: s1s2 regular, no murmur Lungs: decreased breath sounds, no wheeze, basilar rales Abdomen:  Wound dressings clean Musculoskeletal:  No edema Skin:  No rashes.    Ct Head Wo Contrast  11/28/2011  *RADIOLOGY REPORT*  Clinical Data: Altered mental status  CT HEAD WITHOUT CONTRAST  Technique:  Contiguous axial images were obtained from the base of the skull through the vertex without contrast.  Comparison: None.  Findings: No acute intracranial hemorrhage.  No focal mass lesion. No CT evidence of acute infarction.   No midline shift or mass effect.  No hydrocephalus.  Basilar cisterns are patent.   Mild periventricular subcortical white matter hypodensities. Paranasal sinuses and mastoid air cells are clear.  Orbits are normal.  IMPRESSION: No acute intracranial findings.  Mild microvascular disease.   Original Report Authenticated By: Genevive Bi, M.D.    Ct Abdomen Pelvis W Contrast  11/28/2011  *RADIOLOGY REPORT*  Clinical Data: Postoperative day #6 for exploratory laparotomy. Colectomy.  Colostomy.  Now hypotensive.  Worsened white blood cell count.  Surgery for diverticular abscess.  Pelvic abscess with drain placement 11/22/2011.  Lymphoma diagnosed in April. Chemotherapy.  CT ABDOMEN AND PELVIS WITH CONTRAST  Technique:  Multidetector CT imaging of the abdomen and pelvis was performed following the standard protocol during bolus administration of intravenous contrast.  Contrast: OMNIPAQUE IOHEXOL 300 MG/ML  SOLN  Comparison: 11/21/2011.  Findings: Degradation secondary patient arm position and minimal motion.  Bibasilar collapse / consolidative change.  Patchy right middle lobe airspace disease.  Mild cardiomegaly.  Moderate right and small to moderate left-sided pleural effusions.  Hepatic dome hypoattenuating lesions are similar to 11/11/2011 and were most consistent with cysts on that exam.  There is also subcapsular cyst in the right lobe of the liver more inferiorly.  Normal spleen.  The stomach is contrast filled, with the nasogastric terminating at gastric antrum.  Suspect a large descending duodenal diverticulum, present on prior exams.  Normal pancreas.  Gallbladder fundal stones versus mild focal wall thickening on image 46. Concurrent dependent stone.  The gallbladder measures 9.0 cm maximally without surrounding inflammation.  No gross biliary ductal dilatation.  Normal adrenal glands and kidneys.  No retroperitoneal or retrocrural adenopathy.  Rectal stump/short Hartmann's pouch creation.  Descending colostomy in the left lower quadrant.  Normal caliber of the remainder of the  colon.  Normal caliber of the small bowel loops. No pneumatosis or free intraperitoneal air.  Small volume ascites within the left side of the abdomen on image 62 and extending into the left  pelvis.  No abdominal fluid collection/abscess.  Pelvic anasarca.  Air and a Foley catheter within urinary bladder.  Hysterectomy. Probable residual left ovarian tissue on image 75.  There is interloop fluid which measures 2.2 x 3.2 cm in the small bowel mesentery of the pelvis on image 68.  This is contiguous with a left paracentral pelvic fluid collection which measures 3.1 x 4.1 cm on image 74.  There is subtle contiguous peritoneal thickening or enhancement in the left side of the cul-de-sac on images 78 - 80.  Midline pelvic laparotomy changes. No acute osseous abnormality.  IMPRESSION: Surgical changes of descending colostomy and short Hartmann's pouch/rectal stump.  Ill-defined pelvic interloop fluid with subtle cul-de-sac peritoneal thickening or enhancement.  Cannot exclude infected ascites or developing early abscess. 2.  Gallstones with borderline gallbladder distention.  No specific evidence of acute cholecystitis.  Correlate with right upper quadrant symptoms and consider abdominal ultrasound. 3.  Right greater left pleural effusions with adjacent atelectasis or infection.  Concurrent right middle lobe infection. 4.  Multifactorial degradation.   Original Report Authenticated By: Consuello Bossier, M.D.    Dg Chest Port 1 View  11/29/2011  *RADIOLOGY REPORT*  Clinical Data: Follow-up CHF/atelectasis  PORTABLE CHEST - 1 VIEW  Comparison: Prior chest x-ray 10/28/2011  Findings: Stable in satisfactory position of support apparatus. Endotracheal tube 8.4 cm above the carina.  Left subclavian central venous catheter, and right IJ single lumen Port-A-Cath and incompletely imaged nasogastric tube are in unchanged position. Increased pulmonary vascular congestion now with mild interstitial edema.  Enlarging left greater  than right pleural effusions. Developing right basilar opacity.  IMPRESSION:  1.  Interval development of a mild pulmonary edema 2.  Enlarging left greater than right pleural effusions.  The left effusion is moderate, the right is small. 3.  Developing right basilar opacity may reflect atelectasis and / or infiltrate 4.  Stable and satisfactory support apparatus   Original Report Authenticated By: Alvino Blood Chest Port 1 View  11/28/2011  *RADIOLOGY REPORT*  Clinical Data: Status post right-sided thoracentesis.  PORTABLE CHEST - 1 VIEW  Comparison: Chest x-ray 11/28/2011.  Findings: An endotracheal tube is in place with tip 4.8 cm above the carina. A nasogastric tube is seen extending into the stomach, however, the tip of the nasogastric tube extends below the lower margin of the image. There is a left-sided subclavian central venous catheter with tip terminating in the superior aspect of the right atrium. Right-sided internal jugular single lumen Port-A-Cath with tip terminating at the superior cavoatrial junction.  The lung volumes remain low.  However, compared to the recent prior study there is significantly improved aeration throughout the right hemithorax, which appears to reflects near complete resolution of the right-sided pleural effusion. Small amount of ill-defined air space disease in the right suprahilar and perihilar region is unchanged.  Moderate left-sided pleural effusion and left basilar atelectasis and/or consolidation is unchanged.  Mild pulmonary venous congestion without frank pulmonary edema.  Mild cardiomegaly is unchanged.  Mediastinal contours are unremarkable. Atherosclerosis in the thoracic aorta.  IMPRESSION: 1.  Support apparatus, as above. 2.  Near complete resolution of right-sided pleural effusion with improved aeration in the right lung.  Radiographic appearance of the chest is otherwise essentially unchanged, as above.   Original Report Authenticated By: Florencia Reasons, M.D.     Dg Chest Port 1 View  11/28/2011  *RADIOLOGY REPORT*  Clinical Data: CHF, follow up  PORTABLE CHEST - 1 VIEW  Comparison: Portable exam 0515 hours compared to 11/27/2011  Findings: Tip of endotracheal tube 2.5 cm above carina. Nasogastric tube extends into stomach. Right side Port-A-Cath, tip projecting over SVC near cavoatrial junction. Left subclavian central venous catheter tip projecting over cavoatrial junction. Normal heart size. Bilateral airspace infiltrates and layered neural effusions unchanged. No pneumothorax.  IMPRESSION: No interval change.   Original Report Authenticated By: Lollie Marrow, M.D.      ASSESSMENT AND PLAN  PULMONARY  Lab 11/28/11 0234 11/28/11 0221 11/27/11 2359 11/25/11 0445 11/24/11 0338 11/23/11 1613 11/23/11 0355  PHART -- -- 7.475* 7.444 7.377 7.367 7.374  PCO2ART -- -- 35.9 33.5* 34.1* 34.4* 29.3*  PO2ART -- -- 56.3* 89.1 80.5 69.2* 107.0*  HCO3 -- -- 26.4* 22.7 19.8* 19.3* 16.9*  O2SAT 43.3 38.3 89.2 98.7 97.8 -- --   Ventilator Settings: Vent Mode:  [-] PRVC FiO2 (%):  [30 %-50 %] 30 % Set Rate:  [12 bmp] 12 bmp Vt Set:  [350 mL] 350 mL PEEP:  [5 cmH20] 5 cmH20 Pressure Support:  [10 cmH20] 10 cmH20 Plateau Pressure:  [7 cmH20-12 cmH20] 12 cmH20  ETT:  8/31 >>   A: Acute respiratory failure in setting of septic shock from peritonitis. P:   F/u CXR Pressure support wean as tolerated>>agitation significant barrier to vent weaning Keep in even to negative fluid balance as tolerated No hx of obstructive lung disease or smoking>>changed BD's to prn  CARDIOVASCULAR  Lab 11/28/11 1150 11/28/11 0240 11/28/11 0230 11/27/11 2350 11/23/11 1611 11/23/11 0415 11/22/11 1259  TROPONINI <0.30 <0.30 -- 0.38* <0.30 -- --  LATICACIDVEN -- -- 2.1 -- -- 5.4* 5.8*  PROBNP -- -- -- -- -- -- --   Lines:  [place] CVL 8/31 >>   Echo 9/01>>periapical akinesis, EF 25 to 30%, diffuse hypokinesis, grade 1 diastolic dysfx, mod AR, mild MR, mod TR, mild/mod RV  systolic dysfx, PAS 37 mmHg  A: Septic shock 2nd to peritonitis>>resolved. A fib with RVR>>back in sinus 9/02. Acute systolic CHF>>?if this is sepsis related cardiomyopathy. Hypotension 9/06 more likely related to diuresis and lopressor use rather than recurrent sepsis (she is negative 5 liters since 9/03). P:  Keep CVP > 8 Hold lopressor for now Wean off pressors to keep SBP > 90 Change solu-cortef to 50 mg q8h May need cardiology to re-assess  RENAL  Lab 11/29/11 0500 11/28/11 0230 11/27/11 2350 11/27/11 1745 11/27/11 0530 11/26/11 1030 11/24/11 0513  NA 135 131* 132* 134* 138 -- --  K 3.6 3.8 -- -- -- -- --  CL 97 94* 95* 91* 103 -- --  CO2 30 30 31  32 26 -- --  BUN 11 8 8 7 7  -- --  CREATININE 0.41* 0.51 0.51 0.53 0.53 -- --  CALCIUM 7.5* 6.7* 6.6* 7.3* 7.2* -- --  MG -- -- 1.3* 1.4* 1.4* 1.8 1.9  PHOS -- -- 2.9 3.3 2.0* 2.3 2.2*   Intake/Output      09/06 0701 - 09/07 0700 09/07 0701 - 09/08 0700   I.V. (mL/kg) 1540 (25.5)    Other 960  IV Piggyback 800    TPN 1110    Total Intake(mL/kg) 4410 (72.9)    Urine (mL/kg/hr) 3125 (2.2)    Emesis/NG output 850    Other 1000    Total Output 4975    Net -565.1          Foley:  Placed 11/21/2011  A: Hypokalemia, hypomagnesemia, hypophosphatemia. P:   F/u BMET, electrolytes Monitor renal fx, urine outpt  GASTROINTESTINAL  Lab 11/28/11 1715 11/27/11 2350 11/27/11 0530 11/25/11 0417 11/23/11 0415  AST -- 13 14 11 15   ALT -- 9 12 8 8   ALKPHOS -- 95 120* 104 54  BILITOT -- 0.2* 0.2* 0.2* 0.2*  PROT 3.7* 3.4* 3.9* 3.3* 3.2*  ALBUMIN -- 1.5* 1.7* 1.2* 1.1*    A:  Perforated sigmoid diverticulum s/p laparotomy>>CT abd/pelvis 9/06 not impressive for abscess. Nutrition. P:   Post-op care per [facility] TNA started 9/04 per [facility]  HEMATOLOGIC  Lab 11/29/11 0500 11/28/11 0230 11/27/11 2350 11/27/11 0530 11/26/11 0445 11/24/11 0513 11/22/11 1258  HGB 8.6* 8.9* 8.8* 9.1* 8.3* -- --  HCT 25.0* 25.5* 25.3* 26.8* 23.7* -- --    PLT 197 158 142* 201 172 -- --  INR -- 1.41 -- -- -- 1.14 1.40  APTT -- 36 -- -- -- -- 31   A:  Follicular B cell lymphoma, currently undergoing chemotherapy. Anemia of critical illness, and chronic disease. Thrombocytopenia likely related to sepsis/critical illness>>improved. P:  F/u CBC Transfuse for Hb < 7  INFECTIOUS  Lab 11/29/11 0500 11/28/11 0230 11/27/11 2350 11/27/11 0530 11/26/11 0445 11/23/11 0415  WBC 36.5* 29.9* 28.6* 23.0* 26.4* --  PROCALCITON -- 0.29 -- -- -- 2.35   Cultures: Blood 8/31 >>negative Urine 8/31 >>negative Sputum 9/05 >> Rt pleural fluid 9/06>> Urine 9/06>>GNR>> C diff 9/06>>negative Blood 9/06>>  Antibiotics: Cipro 8/31 >>9/06 Flagyl 8/31 >> Fluconazole 8/31 >> Aztreonam 9/07>>  A:  Peritonitis 2nd to sigmoid perforation. Increased respiratory secretions 9/05>>improved 9/07. Increasing WBC, but no fever>>she is getting solu cortef.  ?of infiltrate vs ATX on CXR/CT, and GNR in urine cx 9/06. P:   D7/x flagyl, diflucan D/c cipro Add aztreonam F/u cx results and adjust abx further.  ENDOCRINE  Lab 11/29/11 0336 11/28/11 2351 11/28/11 1935 11/28/11 1559 11/28/11 1144  GLUCAP 207* 191* 175* 192* 182*   A:  Hyperglycemia. P:   SSI  NEUROLOGIC  A:  Sedation.  CT head 9/06 unremarkable. P:   Try to limit sedation while weaning vent  BEST PRACTICE / DISPOSITION Level of Care:  ICU Primary Service:  Surgery Consultants: PCCM, Oncology Code Status:  Limited>>no CPR, no defibrillation Diet:  TPN DVT Px:  Lovenox GI Px:  Protonix Skin Integrity:  Good Social / Family: Updated family 9/06  CC time 35 minutes  Coralyn Helling, MD [facility] Pulmonary/Critical Care 11/29/2011, 8:25 AM Pager:  [phone] After 3pm call: [phone]

## 2011-11-29 NOTE — Progress Notes (Signed)
Pt very agitated and restless at times during the night. [name]

## 2011-11-29 NOTE — Progress Notes (Addendum)
PARENTERAL NUTRITION CONSULT NOTE   Pharmacy Consult for TPN Indication: Prolonged ileus  Allergies  Allergen Reactions  . Codeine Hives  . Other Nausea And Vomiting    anesthesia  . Penicillins Hives  . Sulfa Antibiotics Hives   Patient Measurements: Height: 4\' 11"  (149.9 cm) Weight: 133 lb 6.1 oz (60.5 kg) IBW/kg (Calculated) : 43.2  Adjusted Body Weight: 50.4  Vital Signs: Temp: 97.5 F (36.4 C) (09/07 0400) Temp src: Oral (09/07 0400) BP: 107/57 mmHg (09/07 0700) Intake/Output from previous day: 09/06 0701 - 09/07 0700 In: 4410 [I.V.:1540; IV Piggyback:800; TPN:1110] Out: 4975 [Urine:3125; Emesis/NG output:850]  Labs:  Basename 11/29/11 0500 11/28/11 0230 11/27/11 2350  WBC 36.5* 29.9* 28.6*  HGB 8.6* 8.9* 8.8*  HCT 25.0* 25.5* 25.3*  PLT 197 158 142*  APTT -- 36 --  INR -- 1.41 --    Basename 11/29/11 0500 11/28/11 1715 11/28/11 0230 11/27/11 2350 11/27/11 1745 11/27/11 0530 11/26/11 1030  NA 135 -- 131* 132* -- -- --  K 3.6 -- 3.8 3.7 -- -- --  CL 97 -- 94* 95* -- -- --  CO2 30 -- 30 31 -- -- --  GLUCOSE 146* -- 98 128* -- -- --  BUN 11 -- 8 8 -- -- --  CREATININE 0.41* -- 0.51 0.51 -- -- --  LABCREA -- -- -- -- -- -- --  CREAT24HRUR -- -- -- -- -- -- --  CALCIUM 7.5* -- 6.7* 6.6* -- -- --  MG -- -- -- 1.3* 1.4* 1.4* --  PHOS -- -- -- 2.9 3.3 2.0* --  PROT -- 3.7* -- 3.4* -- 3.9* --  ALBUMIN -- -- -- 1.5* -- 1.7* --  AST -- -- -- 13 -- 14 --  ALT -- -- -- 9 -- 12 --  ALKPHOS -- -- -- 95 -- 120* --  BILITOT -- -- -- 0.2* -- 0.2* --  BILIDIR -- -- -- -- -- -- --  IBILI -- -- -- -- -- -- --  PREALBUMIN -- -- -- -- -- -- 11.7*  TRIG -- -- -- -- -- 97 --  CHOLHDL -- -- -- -- -- -- --  CHOL -- 64 -- -- -- 72 --  Corrected Cal 9/7 = 9.5 (wnl) Estimated Creatinine Clearance: 46.6 ml/min (by C-G formula based on Cr of 0.41).    Basename 11/29/11 0336 11/28/11 2351 11/28/11 1935  GLUCAP 207* 191* 175*   CBGs & Insulin requirements past 24 hours:    CBGs: 175-207  Resistant SSI Q4h: 26 units given  Nutritional Goals:   RD recs 11/26/11: 1468 kcals, 80-90 gm protein, daily  Clinimix 5/20 goal rate of 60 ml/hr (+ Lipids MWF) to provide: 72 g/day protein and an average of 1472 Kcal/day (1746 Kcal/day MWF, 1267 Kcal/day STTHS).  Current nutrition:   Diet: NPO  [facility]F: [facility] at [facility]  Assessment:   78 yo F admit on 8/30 w/ ruptured sigmoid diverticulum, peritonitis, and pelvic abscess. S/p laparotomy 8/31.  TNA started 9/4 for prolonged ileus.  High risk of refeeding syndrome - patient with low K, Mag and Phos today.  Glucose elevated on resistant SSI - will add insulin to TNA today.  CT abd/pelvis 9/6 Cannot exclude infected ascites or developing early abscess.   Renal/hepatic function:  WNL  Electrolytes: plan to keep K > 4 for ileus, Na and Cl improved to wnl. Mag and phos replaced 9/5.  Corrected Ca WNL.  Pre-Albumin: 11.7 (9/4)  TG/Cholesterol: wnl 9/5  GI prophylaxis:  pantoprazole  Plan:  At 1800 tonight Continue Clinimix E 5/20 at goal rate of 60 ml/hr.  TNA to contain fat emulsion, standard multivitamins and trace elements MWF only due to ongoing shortage Continue IV[facility] at [facility]  KCl x 4 runs to keep K > 4 given ileus Add regular insulin 20 units/2L Clinimix (patient will received ~14 units of insulin over 24 hours) TNA lab panels on Mondays & Thursdays. F/u daily.  Geoffry Paradise, PharmD, BCPS Pager: [phone] 7:30 AM Pharmacy #: [phone]

## 2011-11-30 ENCOUNTER — Inpatient Hospital Stay (HOSPITAL_COMMUNITY): Payer: Medicare Other

## 2011-11-30 LAB — GLUCOSE, CAPILLARY
Glucose-Capillary: 128 mg/dL — ABNORMAL HIGH (ref 70–99)
Glucose-Capillary: 132 mg/dL — ABNORMAL HIGH (ref 70–99)
Glucose-Capillary: 138 mg/dL — ABNORMAL HIGH (ref 70–99)

## 2011-11-30 LAB — BASIC METABOLIC PANEL
BUN: 15 mg/dL (ref 6–23)
Chloride: 103 mEq/L (ref 96–112)
GFR calc Af Amer: >90 mL/min (ref >90)
GFR calc non Af Amer: >90 mL/min (ref >90)
Glucose, Bld: 172 mg/dL — ABNORMAL HIGH (ref 70–99)
Potassium: 3.8 mEq/L (ref 3.5–5.1)
Sodium: 137 mEq/L (ref 135–145)

## 2011-11-30 LAB — URINE CULTURE: Colony Count: 15000

## 2011-11-30 LAB — CBC
HCT: 22.1 % — ABNORMAL LOW (ref 36.0–46.0)
Hemoglobin: 7.5 g/dL — ABNORMAL LOW (ref 12.0–15.0)
RBC: 2.47 MIL/uL — ABNORMAL LOW (ref 3.87–5.11)

## 2011-11-30 MED ORDER — METOPROLOL TARTRATE 1 MG/ML IV SOLN
2.5000 mg | Freq: Once | INTRAVENOUS | Status: AC
  Administered 2011-11-30: 2.5 mg via INTRAVENOUS
  Filled 2011-11-30 (×2): qty 5

## 2011-11-30 MED ORDER — IPRATROPIUM BROMIDE 0.02 % IN SOLN
0.5000 mg | RESPIRATORY_TRACT | Status: DC | PRN
  Filled 2011-11-30 (×3): qty 2.5

## 2011-11-30 MED ORDER — FENTANYL CITRATE 0.05 MG/ML IJ SOLN
12.5000 ug | INTRAMUSCULAR | Status: DC | PRN
  Administered 2011-11-30 – 2011-12-01 (×4): 12.5 ug via INTRAVENOUS
  Filled 2011-11-30 (×4): qty 2
  Filled 2011-11-30: qty 4
  Filled 2011-11-30: qty 2

## 2011-11-30 MED ORDER — HYDROCORTISONE SOD SUCCINATE 100 MG IJ SOLR
50.0000 mg | Freq: Two times a day (BID) | INTRAMUSCULAR | Status: DC
  Administered 2011-11-30 – 2011-12-02 (×4): 50 mg via INTRAVENOUS
  Filled 2011-11-30 (×4): qty 1

## 2011-11-30 MED ORDER — METOPROLOL TARTRATE 1 MG/ML IV SOLN
2.5000 mg | Freq: Four times a day (QID) | INTRAVENOUS | Status: DC
  Administered 2011-11-30: 2.5 mg via INTRAVENOUS
  Filled 2011-11-30: qty 5

## 2011-11-30 MED ORDER — ALBUTEROL SULFATE (5 MG/ML) 0.5% IN NEBU: 2.5000 mg | INHALATION_SOLUTION | RESPIRATORY_TRACT | Status: DC | PRN

## 2011-11-30 MED ORDER — INSULIN REGULAR HUMAN 100 UNIT/ML IJ SOLN
INTRAVENOUS | Status: AC
  Administered 2011-11-30: 18:00:00 via INTRAVENOUS
  Filled 2011-11-30: qty 2000

## 2011-11-30 MED ORDER — FUROSEMIDE 10 MG/ML IJ SOLN
40.0000 mg | Freq: Once | INTRAMUSCULAR | Status: AC
  Administered 2011-11-30: 40 mg via INTRAVENOUS
  Filled 2011-11-30 (×2): qty 4

## 2011-11-30 MED ORDER — ALBUTEROL SULFATE (5 MG/ML) 0.5% IN NEBU
2.5000 mg | INHALATION_SOLUTION | Freq: Four times a day (QID) | RESPIRATORY_TRACT | Status: DC
  Administered 2011-11-30 – 2011-12-09 (×35): 2.5 mg via RESPIRATORY_TRACT
  Filled 2011-11-30 (×36): qty 0.5

## 2011-11-30 MED ORDER — IPRATROPIUM BROMIDE 0.02 % IN SOLN
0.5000 mg | Freq: Four times a day (QID) | RESPIRATORY_TRACT | Status: DC
  Administered 2011-11-30 – 2011-12-09 (×35): 0.5 mg via RESPIRATORY_TRACT
  Filled 2011-11-30 (×34): qty 2.5

## 2011-11-30 MED ORDER — ACETYLCYSTEINE 20 % IN SOLN
3.0000 mL | Freq: Four times a day (QID) | RESPIRATORY_TRACT | Status: AC
  Administered 2011-11-30 – 2011-12-02 (×6): 3 mL via RESPIRATORY_TRACT
  Filled 2011-11-30 (×11): qty 4

## 2011-11-30 MED ORDER — POTASSIUM CHLORIDE 10 MEQ/50ML IV SOLN
10.0000 meq | INTRAVENOUS | Status: AC
  Administered 2011-11-30 (×2): 10 meq via INTRAVENOUS
  Filled 2011-11-30: qty 100

## 2011-11-30 MED ORDER — METOPROLOL TARTRATE 1 MG/ML IV SOLN
5.0000 mg | Freq: Four times a day (QID) | INTRAVENOUS | Status: DC
  Administered 2011-11-30 – 2011-12-03 (×8): 5 mg via INTRAVENOUS
  Filled 2011-11-30 (×14): qty 5

## 2011-11-30 NOTE — Progress Notes (Signed)
Patient requiring more O2, from 4L to 6L nasal cannula with sats 88-90%.  HR sustaining in 130s after Metoprolol 2.5mg  IV dose as previously ordered.  Patient clammy and sweaty, afebrile and CBG 183.  Lung sounds coarse with rhonchi.  Patient unable to follow instruction to cough adequately enough to clear secretions.  Minimal secretions while previously on ventilator.  RT called to bedside, patient NT suctioned with thick tan secretions returned.  Patient's O2 sats slow to recover, placed patient on non-rebreather.  ELink MD notified, new orders obtained.  Will continue to monitor.

## 2011-11-30 NOTE — Progress Notes (Signed)
8 Days Post-Op  Subjective: Intubated, sedated, critically ill  Objective: Vital signs in last 24 hours: Temp:  [97.8 F (36.6 C)-99.9 F (37.7 C)] 99.9 F (37.7 C) (09/08 0400) Pulse Rate:  [70-98] 70  (09/08 0437) Resp:  [9-20] 12  (09/08 0630) BP: (86-129)/(40-67) 97/48 mmHg (09/08 0630) SpO2:  [94 %-100 %] 98 % (09/08 0630) FiO2 (%):  [30 %] 30 % (09/08 0600) Weight:  [144 lb 13.5 oz (65.7 kg)] 144 lb 13.5 oz (65.7 kg) (09/08 0430) Last BM Date: 11/28/11 (Scant amount of black, liquid stool in colostomy.)  Intake/Output from previous day: 09/07 0701 - 09/08 0700 In: 3934.1 [I.V.:1814.1; IV Piggyback:710; TPN:1410] Out: 2445 [Urine:2425; Stool:20] Intake/Output this shift:    Resp: clear to auscultation bilaterally GI: soft, nontender. ostomy pink. wound clean  Lab Results:   Basename 11/30/11 0500 11/29/11 0500  WBC 34.8* 36.5*  HGB 7.5* 8.6*  HCT 22.1* 25.0*  PLT 227 197   BMET  Basename 11/30/11 0500 11/29/11 0500  NA 137 135  K 3.8 3.6  CL 103 97  CO2 31 30  GLUCOSE 172* 146*  BUN 15 11  CREATININE 0.46* 0.41*  CALCIUM 7.7* 7.5*   PT/INR  Basename 11/28/11 0230  LABPROT 17.5*  INR 1.41   ABG  Basename 11/27/11 2359  PHART 7.475*  HCO3 26.4*    Studies/Results: Ct Head Wo Contrast  11/28/2011  *RADIOLOGY REPORT*  Clinical Data: Altered mental status  CT HEAD WITHOUT CONTRAST  Technique:  Contiguous axial images were obtained from the base of the skull through the vertex without contrast.  Comparison: None.  Findings: No acute intracranial hemorrhage.  No focal mass lesion. No CT evidence of acute infarction.   No midline shift or mass effect.  No hydrocephalus.  Basilar cisterns are patent.  Mild periventricular subcortical white matter hypodensities. Paranasal sinuses and mastoid air cells are clear.  Orbits are normal.  IMPRESSION: No acute intracranial findings.  Mild microvascular disease.   Original Report Authenticated By: Genevive Bi,  M.D.    Ct Abdomen Pelvis W Contrast  11/28/2011  *RADIOLOGY REPORT*  Clinical Data: Postoperative day #6 for exploratory laparotomy. Colectomy.  Colostomy.  Now hypotensive.  Worsened white blood cell count.  Surgery for diverticular abscess.  Pelvic abscess with drain placement 11/22/2011.  Lymphoma diagnosed in April. Chemotherapy.  CT ABDOMEN AND PELVIS WITH CONTRAST  Technique:  Multidetector CT imaging of the abdomen and pelvis was performed following the standard protocol during bolus administration of intravenous contrast.  Contrast: OMNIPAQUE IOHEXOL 300 MG/ML  SOLN  Comparison: 11/21/2011.  Findings: Degradation secondary patient arm position and minimal motion.  Bibasilar collapse / consolidative change.  Patchy right middle lobe airspace disease.  Mild cardiomegaly.  Moderate right and small to moderate left-sided pleural effusions.  Hepatic dome hypoattenuating lesions are similar to 11/11/2011 and were most consistent with cysts on that exam.  There is also subcapsular cyst in the right lobe of the liver more inferiorly.  Normal spleen.  The stomach is contrast filled, with the nasogastric terminating at gastric antrum.  Suspect a large descending duodenal diverticulum, present on prior exams.  Normal pancreas.  Gallbladder fundal stones versus mild focal wall thickening on image 46. Concurrent dependent stone.  The gallbladder measures 9.0 cm maximally without surrounding inflammation.  No gross biliary ductal dilatation.  Normal adrenal glands and kidneys.  No retroperitoneal or retrocrural adenopathy.  Rectal stump/short Hartmann's pouch creation.  Descending colostomy in the left lower quadrant.  Normal  caliber of the remainder of the colon.  Normal caliber of the small bowel loops. No pneumatosis or free intraperitoneal air.  Small volume ascites within the left side of the abdomen on image 62 and extending into the left  pelvis.  No abdominal fluid collection/abscess.  Pelvic anasarca.   Air and a Foley catheter within urinary bladder.  Hysterectomy. Probable residual left ovarian tissue on image 75.  There is interloop fluid which measures 2.2 x 3.2 cm in the small bowel mesentery of the pelvis on image 68.  This is contiguous with a left paracentral pelvic fluid collection which measures 3.1 x 4.1 cm on image 74.  There is subtle contiguous peritoneal thickening or enhancement in the left side of the cul-de-sac on images 78 - 80.  Midline pelvic laparotomy changes. No acute osseous abnormality.  IMPRESSION: Surgical changes of descending colostomy and short Hartmann's pouch/rectal stump.  Ill-defined pelvic interloop fluid with subtle cul-de-sac peritoneal thickening or enhancement.  Cannot exclude infected ascites or developing early abscess. 2.  Gallstones with borderline gallbladder distention.  No specific evidence of acute cholecystitis.  Correlate with right upper quadrant symptoms and consider abdominal ultrasound. 3.  Right greater left pleural effusions with adjacent atelectasis or infection.  Concurrent right middle lobe infection. 4.  Multifactorial degradation.   Original Report Authenticated By: Consuello Bossier, M.D.    Dg Chest Port 1 View  11/29/2011  *RADIOLOGY REPORT*  Clinical Data: Follow-up CHF/atelectasis  PORTABLE CHEST - 1 VIEW  Comparison: Prior chest x-ray 10/28/2011  Findings: Stable in satisfactory position of support apparatus. Endotracheal tube 8.4 cm above the carina.  Left subclavian central venous catheter, and right IJ single lumen Port-A-Cath and incompletely imaged nasogastric tube are in unchanged position. Increased pulmonary vascular congestion now with mild interstitial edema.  Enlarging left greater than right pleural effusions. Developing right basilar opacity.  IMPRESSION:  1.  Interval development of a mild pulmonary edema 2.  Enlarging left greater than right pleural effusions.  The left effusion is moderate, the right is small. 3.  Developing right  basilar opacity may reflect atelectasis and / or infiltrate 4.  Stable and satisfactory support apparatus   Original Report Authenticated By: Alvino Blood Chest Port 1 View  11/28/2011  *RADIOLOGY REPORT*  Clinical Data: Status post right-sided thoracentesis.  PORTABLE CHEST - 1 VIEW  Comparison: Chest x-ray 11/28/2011.  Findings: An endotracheal tube is in place with tip 4.8 cm above the carina. A nasogastric tube is seen extending into the stomach, however, the tip of the nasogastric tube extends below the lower margin of the image. There is a left-sided subclavian central venous catheter with tip terminating in the superior aspect of the right atrium. Right-sided internal jugular single lumen Port-A-Cath with tip terminating at the superior cavoatrial junction.  The lung volumes remain low.  However, compared to the recent prior study there is significantly improved aeration throughout the right hemithorax, which appears to reflects near complete resolution of the right-sided pleural effusion. Small amount of ill-defined air space disease in the right suprahilar and perihilar region is unchanged.  Moderate left-sided pleural effusion and left basilar atelectasis and/or consolidation is unchanged.  Mild pulmonary venous congestion without frank pulmonary edema.  Mild cardiomegaly is unchanged.  Mediastinal contours are unremarkable. Atherosclerosis in the thoracic aorta.  IMPRESSION: 1.  Support apparatus, as above. 2.  Near complete resolution of right-sided pleural effusion with improved aeration in the right lung.  Radiographic appearance of the chest  is otherwise essentially unchanged, as above.   Original Report Authenticated By: Florencia Reasons, M.D.     Anti-infectives: Anti-infectives     Start     Dose/Rate Route Frequency Ordered Stop   11/29/11 1000   aztreonam (AZACTAM) 2 g in dextrose 5 % 50 mL IVPB        2 g 100 mL/hr over 30 Minutes Intravenous Every 8 hours 11/29/11 0907     11/28/11  1200   aztreonam (AZACTAM) 2 g in dextrose 5 % 50 mL IVPB  Status:  Discontinued        2 g 100 mL/hr over 30 Minutes Intravenous Every 12 hours 11/28/11 0240 11/28/11 0903   11/28/11 1000   fluconazole (DIFLUCAN) IVPB 200 mg        200 mg 100 mL/hr over 60 Minutes Intravenous Every 24 hours 11/28/11 0915     11/28/11 0200   micafungin (MYCAMINE) 100 mg in sodium chloride 0.9 % 100 mL IVPB  Status:  Discontinued     Comments: Pharmacy to verify the dose      100 mg 100 mL/hr over 1 Hours Intravenous Daily at bedtime 11/28/11 0133 11/28/11 0903   11/28/11 0130   aztreonam (AZACTAM) 2 g in dextrose 5 % 50 mL IVPB        2 g 100 mL/hr over 30 Minutes Intravenous  Once 11/28/11 0130 11/28/11 0230   11/23/11 0300   fluconazole (DIFLUCAN) IVPB 200 mg  Status:  Discontinued        200 mg 100 mL/hr over 60 Minutes Intravenous Every 24 hours 11/22/11 0254 11/26/11 1442   11/22/11 0300   fluconazole (DIFLUCAN) IVPB 400 mg        400 mg 200 mL/hr over 60 Minutes Intravenous  Once 11/22/11 0254 11/22/11 0518   11/22/11 0230   metroNIDAZOLE (FLAGYL) IVPB 500 mg  Status:  Discontinued        500 mg 100 mL/hr over 60 Minutes Intravenous  Once 11/22/11 0229 11/22/11 0230   11/21/11 2230   ciprofloxacin (CIPRO) IVPB 400 mg  Status:  Discontinued        400 mg 200 mL/hr over 60 Minutes Intravenous 2 times daily 11/21/11 2207 11/29/11 0840   11/21/11 2230   metroNIDAZOLE (FLAGYL) IVPB 500 mg        500 mg 100 mL/hr over 60 Minutes Intravenous 3 times per day 11/21/11 2207     11/21/11 2100   meropenem (MERREM) 1 g in sodium chloride 0.9 % 100 mL IVPB  Status:  Discontinued        1 g 200 mL/hr over 30 Minutes Intravenous 3 times per day 11/21/11 2013 11/21/11 2146          Assessment/Plan: s/p Procedure(s) (LRB) with comments: EXPLORATORY LAPAROTOMY (N/A) - drainage of pelvic abscess COLON RESECTION SIGMOID (N/A) COLOSTOMY (N/A) - desending colostomy Continue abx Continue bowel rest  and tpn Critical care per ccm  LOS: 9 days    TOTH III,PAUL S 11/30/2011

## 2011-11-30 NOTE — Progress Notes (Signed)
Name: [name] MRN: [id number] DOB: 08-Aug-1931    LOS: 9  Referring Provider:  Dr. Gerrit Friends  Reason for Referral:  Septic shock  PULMONARY / CRITICAL CARE MEDICINE  HPI:  78 yo female admitted 11/21/2011 5 days of abdominal pain.  Found to have perforated sigmoid diverticulum with peritonitis, and pelvic abscess.  Had laparotomy 8/31.  Developed respiratory failure and shock post-op.  PCCM consulted 8/31.  She has hx of B cell lymphoma currently on chemo as outpt.  Events Since Admission: 8/31 Laparotomy, sigmoid colectomy and ostomy 9/01 A fib with RVR 9/02 Off pressors 9/06 A fib RVR>>back to sinus; added precedex for agitation; hypotensive.  Rt thoracentesis (transudate).  Subjective:  Tolerating pressure support.  Still very sleepy on WUA.  Weaning off pressors.  Vital Signs: Temp:  [97.8 F (36.6 C)-99.9 F (37.7 C)] 99.9 F (37.7 C) (09/08 0400) Pulse Rate:  [70-98] 70  (09/08 0437) Resp:  [10-20] 12  (09/08 0800) BP: (86-129)/(40-67) 105/52 mmHg (09/08 0800) SpO2:  [94 %-100 %] 99 % (09/08 0800) FiO2 (%):  [30 %] 30 % (09/08 0811) Weight:  [144 lb 13.5 oz (65.7 kg)] 144 lb 13.5 oz (65.7 kg) (09/08 0430)  Intake/Output Summary (Last 24 hours) at 11/30/11 0856 Last data filed at 11/30/11 0800  Gross per 24 hour  Intake 4001.15 ml  Output   2620 ml  Net 1381.15 ml   Physical Examination: General: Ill appearing Neuro: Somnolent, moves extremities, no following commands HEENT:  ETT in place Neck:  Supple Cardiovascular: s1s2 regular, no murmur Lungs: decreased breath sounds, no wheeze, basilar rales Abdomen:  Wound dressings clean Musculoskeletal:  No edema Skin:  No rashes.    Ct Head Wo Contrast  11/28/2011  *RADIOLOGY REPORT*  Clinical Data: Altered mental status  CT HEAD WITHOUT CONTRAST  Technique:  Contiguous axial images were obtained from the base of the skull through the vertex without contrast.  Comparison: None.  Findings: No acute intracranial  hemorrhage.  No focal mass lesion. No CT evidence of acute infarction.   No midline shift or mass effect.  No hydrocephalus.  Basilar cisterns are patent.  Mild periventricular subcortical white matter hypodensities. Paranasal sinuses and mastoid air cells are clear.  Orbits are normal.  IMPRESSION: No acute intracranial findings.  Mild microvascular disease.   Original Report Authenticated By: Genevive Bi, M.D.    Ct Abdomen Pelvis W Contrast  11/28/2011  *RADIOLOGY REPORT*  Clinical Data: Postoperative day #6 for exploratory laparotomy. Colectomy.  Colostomy.  Now hypotensive.  Worsened white blood cell count.  Surgery for diverticular abscess.  Pelvic abscess with drain placement 11/22/2011.  Lymphoma diagnosed in April. Chemotherapy.  CT ABDOMEN AND PELVIS WITH CONTRAST  Technique:  Multidetector CT imaging of the abdomen and pelvis was performed following the standard protocol during bolus administration of intravenous contrast.  Contrast: OMNIPAQUE IOHEXOL 300 MG/ML  SOLN  Comparison: 11/21/2011.  Findings: Degradation secondary patient arm position and minimal motion.  Bibasilar collapse / consolidative change.  Patchy right middle lobe airspace disease.  Mild cardiomegaly.  Moderate right and small to moderate left-sided pleural effusions.  Hepatic dome hypoattenuating lesions are similar to 11/11/2011 and were most consistent with cysts on that exam.  There is also subcapsular cyst in the right lobe of the liver more inferiorly.  Normal spleen.  The stomach is contrast filled, with the nasogastric terminating at gastric antrum.  Suspect a large descending duodenal diverticulum, present on prior exams.  Normal pancreas.  Gallbladder fundal stones versus mild focal wall thickening on image 46. Concurrent dependent stone.  The gallbladder measures 9.0 cm maximally without surrounding inflammation.  No gross biliary ductal dilatation.  Normal adrenal glands and kidneys.  No retroperitoneal or  retrocrural adenopathy.  Rectal stump/short Hartmann's pouch creation.  Descending colostomy in the left lower quadrant.  Normal caliber of the remainder of the colon.  Normal caliber of the small bowel loops. No pneumatosis or free intraperitoneal air.  Small volume ascites within the left side of the abdomen on image 62 and extending into the left  pelvis.  No abdominal fluid collection/abscess.  Pelvic anasarca.  Air and a Foley catheter within urinary bladder.  Hysterectomy. Probable residual left ovarian tissue on image 75.  There is interloop fluid which measures 2.2 x 3.2 cm in the small bowel mesentery of the pelvis on image 68.  This is contiguous with a left paracentral pelvic fluid collection which measures 3.1 x 4.1 cm on image 74.  There is subtle contiguous peritoneal thickening or enhancement in the left side of the cul-de-sac on images 78 - 80.  Midline pelvic laparotomy changes. No acute osseous abnormality.  IMPRESSION: Surgical changes of descending colostomy and short Hartmann's pouch/rectal stump.  Ill-defined pelvic interloop fluid with subtle cul-de-sac peritoneal thickening or enhancement.  Cannot exclude infected ascites or developing early abscess. 2.  Gallstones with borderline gallbladder distention.  No specific evidence of acute cholecystitis.  Correlate with right upper quadrant symptoms and consider abdominal ultrasound. 3.  Right greater left pleural effusions with adjacent atelectasis or infection.  Concurrent right middle lobe infection. 4.  Multifactorial degradation.   Original Report Authenticated By: Consuello Bossier, M.D.    Dg Chest Port 1 View  11/29/2011  *RADIOLOGY REPORT*  Clinical Data: Follow-up CHF/atelectasis  PORTABLE CHEST - 1 VIEW  Comparison: Prior chest x-ray 10/28/2011  Findings: Stable in satisfactory position of support apparatus. Endotracheal tube 8.4 cm above the carina.  Left subclavian central venous catheter, and right IJ single lumen Port-A-Cath and  incompletely imaged nasogastric tube are in unchanged position. Increased pulmonary vascular congestion now with mild interstitial edema.  Enlarging left greater than right pleural effusions. Developing right basilar opacity.  IMPRESSION:  1.  Interval development of a mild pulmonary edema 2.  Enlarging left greater than right pleural effusions.  The left effusion is moderate, the right is small. 3.  Developing right basilar opacity may reflect atelectasis and / or infiltrate 4.  Stable and satisfactory support apparatus   Original Report Authenticated By: Alvino Blood Chest Port 1 View  11/28/2011  *RADIOLOGY REPORT*  Clinical Data: Status post right-sided thoracentesis.  PORTABLE CHEST - 1 VIEW  Comparison: Chest x-ray 11/28/2011.  Findings: An endotracheal tube is in place with tip 4.8 cm above the carina. A nasogastric tube is seen extending into the stomach, however, the tip of the nasogastric tube extends below the lower margin of the image. There is a left-sided subclavian central venous catheter with tip terminating in the superior aspect of the right atrium. Right-sided internal jugular single lumen Port-A-Cath with tip terminating at the superior cavoatrial junction.  The lung volumes remain low.  However, compared to the recent prior study there is significantly improved aeration throughout the right hemithorax, which appears to reflects near complete resolution of the right-sided pleural effusion. Small amount of ill-defined air space disease in the right suprahilar and perihilar region is unchanged.  Moderate left-sided pleural effusion and left basilar atelectasis  and/or consolidation is unchanged.  Mild pulmonary venous congestion without frank pulmonary edema.  Mild cardiomegaly is unchanged.  Mediastinal contours are unremarkable. Atherosclerosis in the thoracic aorta.  IMPRESSION: 1.  Support apparatus, as above. 2.  Near complete resolution of right-sided pleural effusion with improved aeration  in the right lung.  Radiographic appearance of the chest is otherwise essentially unchanged, as above.   Original Report Authenticated By: Florencia Reasons, M.D.      ASSESSMENT AND PLAN  PULMONARY  Lab 11/28/11 0234 11/28/11 0221 11/27/11 2359 11/25/11 0445 11/24/11 0338 11/23/11 1613  PHART -- -- 7.475* 7.444 7.377 7.367  PCO2ART -- -- 35.9 33.5* 34.1* 34.4*  PO2ART -- -- 56.3* 89.1 80.5 69.2*  HCO3 -- -- 26.4* 22.7 19.8* 19.3*  O2SAT 43.3 38.3 89.2 98.7 97.8 --   Ventilator Settings: Vent Mode:  [-] PRVC FiO2 (%):  [30 %] 30 % Set Rate:  [12 bmp] 12 bmp Vt Set:  [380 mL] 380 mL PEEP:  [5 cmH20] 5 cmH20 Plateau Pressure:  [10 cmH20-18 cmH20] 16 cmH20  ETT:  8/31 >>   A: Acute respiratory failure in setting of septic shock from peritonitis.  Mental status barrier to extubation. P:   F/u CXR Pressure support wean as tolerated>>agitation significant barrier to vent weaning>>may need to try extubation even if mental status not fully intact Keep in even to negative fluid balance as tolerated No hx of obstructive lung disease or smoking>>changed BD's to prn  CARDIOVASCULAR  Lab 11/28/11 1150 11/28/11 0240 11/28/11 0230 11/27/11 2350 11/23/11 1611  TROPONINI <0.30 <0.30 -- 0.38* <0.30  LATICACIDVEN -- -- 2.1 -- --  PROBNP -- -- -- -- --   Lines:  [place] CVL 8/31 >>   Echo 9/01>>periapical akinesis, EF 25 to 30%, diffuse hypokinesis, grade 1 diastolic dysfx, mod AR, mild MR, mod TR, mild/mod RV systolic dysfx, PAS 37 mmHg  A: Septic shock 2nd to peritonitis>>resolved. A fib with RVR>>back in sinus 9/02. Acute systolic CHF>>?if this is sepsis related cardiomyopathy. Hypotension 9/06 more likely related to diuresis, sedation and lopressor use rather than recurrent sepsis (she is negative 5 liters since 9/03). P:  Keep CVP > 8 Hold lopressor for now Wean off pressors to keep SBP > 90 Change solu-cortef to 50 mg q12h May need cardiology to re-assess  RENAL  Lab  11/30/11 0500 11/29/11 0500 11/28/11 0230 11/27/11 2350 11/27/11 1745 11/27/11 0530 11/26/11 1030 11/24/11 0513  NA 137 135 131* 132* 134* -- -- --  K 3.8 3.6 -- -- -- -- -- --  CL 103 97 94* 95* 91* -- -- --  CO2 31 30 30 31  32 -- -- --  BUN 15 11 8 8 7  -- -- --  CREATININE 0.46* 0.41* 0.51 0.51 0.53 -- -- --  CALCIUM 7.7* 7.5* 6.7* 6.6* 7.3* -- -- --  MG -- -- -- 1.3* 1.4* 1.4* 1.8 1.9  PHOS -- -- -- 2.9 3.3 2.0* 2.3 2.2*   Intake/Output      09/07 0701 - 09/08 0700 09/08 0701 - 09/09 0700   I.V. (mL/kg) 1919.1 (29.2) 86.6 (1.3)   Other     IV Piggyback 710    TPN 1470 60   Total Intake(mL/kg) 4099.1 (62.4) 146.6 (2.2)   Urine (mL/kg/hr) 2425 (1.5) 325   Emesis/NG output 0    Other     Stool 20    Total Output 2445 325   Net +1654.1 -178.4  Stool Occurrence      Foley:  Placed 11/21/2011  A: Hypokalemia, hypomagnesemia, hypophosphatemia. P:   F/u BMET, electrolytes Monitor renal fx, urine outpt  GASTROINTESTINAL  Lab 11/28/11 1715 11/27/11 2350 11/27/11 0530 11/25/11 0417  AST -- 13 14 11   ALT -- 9 12 8   ALKPHOS -- 95 120* 104  BILITOT -- 0.2* 0.2* 0.2*  PROT 3.7* 3.4* 3.9* 3.3*  ALBUMIN -- 1.5* 1.7* 1.2*    A:  Perforated sigmoid diverticulum s/p laparotomy>>CT abd/pelvis 9/06 not impressive for abscess. Nutrition. P:   Post-op care per [facility] TNA started 9/04 per [facility] ?when she can use her gut for meds/nutrition>>defer to [facility]  HEMATOLOGIC  Lab 11/30/11 0500 11/29/11 0500 11/28/11 0230 11/27/11 2350 11/27/11 0530 11/24/11 0513  HGB 7.5* 8.6* 8.9* 8.8* 9.1* --  HCT 22.1* 25.0* 25.5* 25.3* 26.8* --  PLT 227 197 158 142* 201 --  INR -- -- 1.41 -- -- 1.14  APTT -- -- 36 -- -- --   A:  Follicular B cell lymphoma, currently undergoing chemotherapy. Anemia of critical illness, and chronic disease. Thrombocytopenia likely related to sepsis/critical illness>>improved. P:  F/u CBC Transfuse for Hb < 7  INFECTIOUS  Lab 11/30/11 0500 11/29/11 0500  11/28/11 0230 11/27/11 2350 11/27/11 0530  WBC 34.8* 36.5* 29.9* 28.6* 23.0*  PROCALCITON -- -- 0.29 -- --   Cultures: Blood 8/31 >>negative Urine 8/31 >>negative Sputum 9/05 >>Oral flora Rt pleural fluid 9/06>> Urine 9/06>>GNR 15K colonies>> C diff 9/06>>negative Blood 9/06>>  Antibiotics: Cipro 8/31 >>9/06 Flagyl 8/31 >> Fluconazole 8/31 >> Aztreonam 9/07>>  A:  Peritonitis 2nd to sigmoid perforation>>improved. Increased respiratory secretions 9/05>>improved 9/07. Increasing WBC, but no fever>>she is getting solu cortef.  ?of infiltrate vs ATX on CXR/CT, and GNR in urine cx 9/06. Hx of PCN allergy. P:   D8/x flagyl, diflucan D2/x aztreonam F/u cx results and adjust abx further.  ENDOCRINE  Lab 11/30/11 0402 11/29/11 2337 11/29/11 2006 11/29/11 1646 11/29/11 1131  GLUCAP 163* 127* 161* 161* 186*   A:  Hyperglycemia. P:   SSI  NEUROLOGIC  A:  Sedation.  CT head 9/06 unremarkable. P:   Try to limit sedation while weaning vent  BEST PRACTICE / DISPOSITION Level of Care:  ICU Primary Service:  Surgery Consultants: PCCM, Oncology Code Status:  Limited>>no CPR, no defibrillation Diet:  TPN DVT Px:  Lovenox GI Px:  Protonix Skin Integrity:  Good Social / Family: Updated family 9/06  CC time 35 minutes  Coralyn Helling, MD [facility] Pulmonary/Critical Care 11/30/2011, 8:56 AM Pager:  [phone] After 3pm call: [phone]

## 2011-11-30 NOTE — Progress Notes (Signed)
Patient extubated per MD order and OG discontinued at that time.  Spoke with Dr. Ezzard Standing regarding need to replace with NG tube.  OK to leave out for now per MD.  Will notify if patient begins to c/o nausea or begins vomiting.  Will continue to monitor.

## 2011-11-30 NOTE — Progress Notes (Signed)
PARENTERAL NUTRITION CONSULT NOTE   Pharmacy Consult for TPN Indication: Prolonged ileus  Allergies  Allergen Reactions  . Codeine Hives  . Other Nausea And Vomiting    anesthesia  . Penicillins Hives  . Sulfa Antibiotics Hives   Patient Measurements: Height: 4\' 11"  (149.9 cm) Weight: 144 lb 13.5 oz (65.7 kg) IBW/kg (Calculated) : 43.2  Adjusted Body Weight: 50.4  Vital Signs: Temp: 99.9 F (37.7 C) (09/08 0400) Temp src: Oral (09/08 0400) BP: 97/48 mmHg (09/08 0630) Pulse Rate: 70  (09/08 0437) Intake/Output from previous day: 09/07 0701 - 09/08 0700 In: 3934.1 [I.V.:1814.1; IV Piggyback:710; TPN:1410] Out: 2445 [Urine:2425; Stool:20]  Labs:  Basename 11/30/11 0500 11/29/11 0500 11/28/11 0230  WBC 34.8* 36.5* 29.9*  HGB 7.5* 8.6* 8.9*  HCT 22.1* 25.0* 25.5*  PLT 227 197 158  APTT -- -- 36  INR -- -- 1.41    Basename 11/30/11 0500 11/29/11 0500 11/28/11 1715 11/28/11 0230 11/27/11 2350 11/27/11 1745  NA 137 135 -- 131* -- --  K 3.8 3.6 -- 3.8 -- --  CL 103 97 -- 94* -- --  CO2 31 30 -- 30 -- --  GLUCOSE 172* 146* -- 98 -- --  BUN 15 11 -- 8 -- --  CREATININE 0.46* 0.41* -- 0.51 -- --  LABCREA -- -- -- -- -- --  CREAT24HRUR -- -- -- -- -- --  CALCIUM 7.7* 7.5* -- 6.7* -- --  MG -- -- -- -- 1.3* 1.4*  PHOS -- -- -- -- 2.9 3.3  PROT -- -- 3.7* -- 3.4* --  ALBUMIN -- -- -- -- 1.5* --  AST -- -- -- -- 13 --  ALT -- -- -- -- 9 --  ALKPHOS -- -- -- -- 95 --  BILITOT -- -- -- -- 0.2* --  BILIDIR -- -- -- -- -- --  IBILI -- -- -- -- -- --  PREALBUMIN -- -- -- -- -- --  TRIG -- -- -- -- -- --  CHOLHDL -- -- -- -- -- --  CHOL -- -- 64 -- -- --  Corrected Cal 9/8 = 9.7 (wnl) Estimated Creatinine Clearance: 48.5 ml/min (by C-G formula based on Cr of 0.46).    Basename 11/30/11 0402 11/29/11 2337 11/29/11 2006  GLUCAP 163* 127* 161*   CBGs & Insulin requirements past 24 hours:   CBGs: 127-186 (better after new TNA with insulin added)  Resistant SSI Q4h:  23 units given  Regular insulin in TNA bag: 20 units/2L Clinimix  Nutritional Goals:   RD recs 11/26/11: 1468 kcals, 80-90 gm protein daily  Clinimix 5/20 goal rate of 60 ml/hr (+ Lipids MWF) to provide: 72 g/day protein and an average of 1472 Kcal/day (1746 Kcal/day MWF, 1267 Kcal/day STTHS).  Current nutrition:   Diet: NPO  IVF: NS at [facility], NaBicarb 3 amps in D5 @ 60 ml/hr added 9/6  Assessment:   78 yo F admit on 8/30 w/ ruptured sigmoid diverticulum, peritonitis, and pelvic abscess. S/p laparotomy 8/31.  TNA started 9/4 for prolonged ileus.  High risk of refeeding syndrome - patient with low K, Mag and Phos today.  CT abd/pelvis 9/6 Cannot exclude infected ascites or developing early abscess (MD doesn't think it's impressive).  CBGs better since insulin added to TNA 9/7   Renal/hepatic function:  WNL  Electrolytes: plan to keep K > 4 for ileus, Na and Cl improved to wnl. Mag and phos replaced 9/5.  Corrected Ca WNL.  Pre-Albumin: 11.7 (9/4)  TG/Cholesterol: wnl 9/5  GI prophylaxis:  pantoprazole  Plan:  At 1800 tonight Continue Clinimix E 5/20 at goal rate of 60 ml/hr.  TNA to contain fat emulsion, standard multivitamins and trace elements MWF only due to ongoing shortage Continue IVF at [facility]  KCl x 2 runs to keep K > 4 given ileus Increase insulin in TNA to 30 units/2L to decrease SSI needs (patient will receive ~21 units/24h) TNA lab panels on Mondays & Thursdays. F/u daily.  Geoffry Paradise, PharmD, BCPS Pager: [phone] 7:42 AM Pharmacy #: [phone]

## 2011-11-30 NOTE — Progress Notes (Signed)
Patient's HR sustaining in 130s.  Pain medication has been given with no effect on vital signs.  Notified MD at [facility].  New orders received.  Will continue to monitor.

## 2011-11-30 NOTE — Progress Notes (Signed)
Name: [name] MRN: [id number] DOB: 01-10-1932  ELECTRONIC ICU PHYSICIAN NOTE  Problem:  Sinus tachycardia HR 130.  Afebrile.  Good pain control.  CVP17.  Metoprolol 50 mg PO bid prior to admission.  Intervention:  Started Metoprolol 2.5 mg IV q6h.  Orlean Bradford, M.D. Pulmonary and Critical Care Medicine [facility] Cell: [phone] Pager: [phone]  11/30/2011, 3:20 PM

## 2011-11-30 NOTE — Significant Event (Signed)
Mental status improved some since turning off diprivan and fentanyl.  She is tolerating pressure support with good Vt and RR.  Will proceed with extubation, and monitor mental status and respiratory status.  Coralyn Helling, MD [facility] Pulmonary/Critical Care 11/30/2011, 9:12 AM Pager:  [phone] After 3pm call: [phone]

## 2011-12-01 ENCOUNTER — Inpatient Hospital Stay (HOSPITAL_COMMUNITY): Payer: Medicare Other

## 2011-12-01 LAB — BLOOD GAS, ARTERIAL
Acid-Base Excess: 5.2 mmol/L — ABNORMAL HIGH (ref 0.0–2.0)
Bicarbonate: 29.9 mEq/L — ABNORMAL HIGH (ref 20.0–24.0)
FIO2: 100 %
MECHVT: 300 mL
O2 Saturation: 99.7 %
Patient temperature: 37
TCO2: 28.2 mmol/L (ref 0–100)

## 2011-12-01 LAB — GLUCOSE, CAPILLARY
Glucose-Capillary: 115 mg/dL — ABNORMAL HIGH (ref 70–99)
Glucose-Capillary: 142 mg/dL — ABNORMAL HIGH (ref 70–99)
Glucose-Capillary: 142 mg/dL — ABNORMAL HIGH (ref 70–99)

## 2011-12-01 LAB — COMPREHENSIVE METABOLIC PANEL
Albumin: 1.8 g/dL — ABNORMAL LOW (ref 3.5–5.2)
Alkaline Phosphatase: 148 U/L — ABNORMAL HIGH (ref 39–117)
BUN: 22 mg/dL (ref 6–23)
CO2: 30 mEq/L (ref 19–32)
Chloride: 102 mEq/L (ref 96–112)
GFR calc non Af Amer: >90 mL/min (ref >90)
Potassium: 3.5 mEq/L (ref 3.5–5.1)
Total Bilirubin: 0.4 mg/dL (ref 0.3–1.2)

## 2011-12-01 LAB — DIFFERENTIAL
Basophils Relative: 0 % (ref 0–1)
Eosinophils Absolute: 0 10*3/uL (ref 0.0–0.7)
Monocytes Absolute: 1.5 10*3/uL — ABNORMAL HIGH (ref 0.1–1.0)
Neutro Abs: 46.4 10*3/uL — ABNORMAL HIGH (ref 1.7–7.7)
Neutrophils Relative %: 94 % — ABNORMAL HIGH (ref 43–77)

## 2011-12-01 LAB — HEPARIN INDUCED THROMBOCYTOPENIA PNL
UFH High Dose UFH H: 0 % Release
UFH SRA Result: NEGATIVE

## 2011-12-01 LAB — CBC
HCT: 26.4 % — ABNORMAL LOW (ref 36.0–46.0)
Hemoglobin: 8.8 g/dL — ABNORMAL LOW (ref 12.0–15.0)
MCH: 30.3 pg (ref 26.0–34.0)
MCHC: 33.3 g/dL (ref 30.0–36.0)
RDW: 16.6 % — ABNORMAL HIGH (ref 11.5–15.5)

## 2011-12-01 LAB — MAGNESIUM: Magnesium: 1.6 mg/dL (ref 1.5–2.5)

## 2011-12-01 LAB — PREALBUMIN: Prealbumin: 22.6 mg/dL (ref 17.0–34.0)

## 2011-12-01 MED ORDER — ROCURONIUM BROMIDE 50 MG/5ML IV SOLN
INTRAVENOUS | Status: AC
  Filled 2011-12-01: qty 2

## 2011-12-01 MED ORDER — POTASSIUM CHLORIDE 10 MEQ/50ML IV SOLN
INTRAVENOUS | Status: AC
  Administered 2011-12-01: 10 meq
  Filled 2011-12-01: qty 50

## 2011-12-01 MED ORDER — MIDAZOLAM HCL 5 MG/ML IJ SOLN
INTRAMUSCULAR | Status: AC
  Filled 2011-12-01: qty 1

## 2011-12-01 MED ORDER — SODIUM CHLORIDE 0.9 % IV SOLN
1250.0000 mg | Freq: Once | INTRAVENOUS | Status: AC
  Administered 2011-12-01: 1250 mg via INTRAVENOUS
  Filled 2011-12-01: qty 1250

## 2011-12-01 MED ORDER — ETOMIDATE 2 MG/ML IV SOLN
INTRAVENOUS | Status: AC
  Administered 2011-12-01: 10 mg
  Filled 2011-12-01: qty 20

## 2011-12-01 MED ORDER — SUCCINYLCHOLINE CHLORIDE 20 MG/ML IJ SOLN
INTRAMUSCULAR | Status: AC
  Filled 2011-12-01: qty 10

## 2011-12-01 MED ORDER — FAT EMULSION 20 % IV EMUL
250.0000 mL | INTRAVENOUS | Status: AC
  Administered 2011-12-01: 250 mL via INTRAVENOUS
  Filled 2011-12-01: qty 250

## 2011-12-01 MED ORDER — FENTANYL CITRATE 0.05 MG/ML IJ SOLN
25.0000 ug | INTRAMUSCULAR | Status: DC | PRN
  Administered 2011-12-01 (×3): 50 ug via INTRAVENOUS
  Filled 2011-12-01 (×2): qty 2

## 2011-12-01 MED ORDER — POTASSIUM CHLORIDE 10 MEQ/50ML IV SOLN
10.0000 meq | INTRAVENOUS | Status: AC
  Administered 2011-12-01 (×3): 10 meq via INTRAVENOUS
  Filled 2011-12-01 (×3): qty 50

## 2011-12-01 MED ORDER — FUROSEMIDE 10 MG/ML IJ SOLN
20.0000 mg | Freq: Four times a day (QID) | INTRAMUSCULAR | Status: AC
  Administered 2011-12-01 – 2011-12-02 (×3): 20 mg via INTRAVENOUS
  Filled 2011-12-01 (×4): qty 2

## 2011-12-01 MED ORDER — POTASSIUM CHLORIDE 10 MEQ/50ML IV SOLN
10.0000 meq | Freq: Once | INTRAVENOUS | Status: AC
  Administered 2011-12-01: 10 meq via INTRAVENOUS

## 2011-12-01 MED ORDER — FENTANYL CITRATE 0.05 MG/ML IJ SOLN
25.0000 ug | INTRAMUSCULAR | Status: DC | PRN
  Administered 2011-12-01 – 2011-12-07 (×19): 50 ug via INTRAVENOUS
  Filled 2011-12-01 (×24): qty 2

## 2011-12-01 MED ORDER — MAGNESIUM SULFATE 40 MG/ML IJ SOLN
2.0000 g | Freq: Once | INTRAMUSCULAR | Status: AC
  Administered 2011-12-01: 2 g via INTRAVENOUS
  Filled 2011-12-01: qty 50

## 2011-12-01 MED ORDER — DEXMEDETOMIDINE HCL IN NACL 200 MCG/50ML IV SOLN
0.2000 ug/kg/h | INTRAVENOUS | Status: DC
  Administered 2011-12-01: 0.6 ug/kg/h via INTRAVENOUS
  Administered 2011-12-01: 0.4 ug/kg/h via INTRAVENOUS
  Administered 2011-12-02: 0.9 ug/kg/h via INTRAVENOUS
  Administered 2011-12-02: 1.2 ug/kg/h via INTRAVENOUS
  Administered 2011-12-02: 0.6 ug/kg/h via INTRAVENOUS
  Filled 2011-12-01 (×6): qty 50

## 2011-12-01 MED ORDER — MIDAZOLAM HCL 2 MG/2ML IJ SOLN
1.0000 mg | INTRAMUSCULAR | Status: DC | PRN
  Administered 2011-12-01 (×2): 2 mg via INTRAVENOUS

## 2011-12-01 MED ORDER — MIDAZOLAM HCL 5 MG/ML IJ SOLN
INTRAMUSCULAR | Status: AC
  Administered 2011-12-01: 5 mg
  Filled 2011-12-01: qty 1

## 2011-12-01 MED ORDER — ZINC TRACE METAL 1 MG/ML IV SOLN
INTRAVENOUS | Status: AC
  Administered 2011-12-01: 18:00:00 via INTRAVENOUS
  Filled 2011-12-01: qty 2000

## 2011-12-01 MED ORDER — LIDOCAINE HCL (CARDIAC) 20 MG/ML IV SOLN
INTRAVENOUS | Status: AC
  Filled 2011-12-01: qty 5

## 2011-12-01 MED ORDER — MIDAZOLAM HCL 5 MG/ML IJ SOLN
2.0000 mg | INTRAMUSCULAR | Status: DC | PRN
  Administered 2011-12-01 – 2011-12-05 (×12): 4 mg via INTRAVENOUS
  Filled 2011-12-01 (×14): qty 1

## 2011-12-01 MED ORDER — VANCOMYCIN HCL 500 MG IV SOLR
500.0000 mg | Freq: Two times a day (BID) | INTRAVENOUS | Status: DC
  Administered 2011-12-01 – 2011-12-03 (×4): 500 mg via INTRAVENOUS
  Filled 2011-12-01 (×4): qty 500

## 2011-12-01 NOTE — Progress Notes (Signed)
PARENTERAL NUTRITION CONSULT NOTE   Pharmacy Consult for TPN Indication: Prolonged ileus  Allergies  Allergen Reactions  . Codeine Hives  . Other Nausea And Vomiting    anesthesia  . Penicillins Hives  . Sulfa Antibiotics Hives   Patient Measurements: Height: 4\' 11"  (149.9 cm) Weight: 144 lb 13.5 oz (65.7 kg) IBW/kg (Calculated) : 43.2  Adjusted Body Weight: 50.4  Vital Signs: Temp: 98.5 F (36.9 C) (09/09 0400) Temp src: Axillary (09/09 0400) BP: 139/85 mmHg (09/09 0630) Pulse Rate: 102  (09/09 0630) Intake/Output from previous day: 09/08 0701 - 09/09 0700 In: 2550.6 [I.V.:506.6; IV Piggyback:664; TPN:1380] Out: 2615 [Urine:2535; Emesis/NG output:50; Stool:30]  Labs:  Basename 12/01/11 0408 11/30/11 0500 11/29/11 0500  WBC 49.4* 34.8* 36.5*  HGB 8.8* 7.5* 8.6*  HCT 26.4* 22.1* 25.0*  PLT 305 227 197  APTT -- -- --  INR -- -- --    Basename 12/01/11 0408 11/30/11 0500 11/29/11 0500 11/28/11 1715  NA 141 137 135 --  K 3.5 3.8 3.6 --  CL 102 103 97 --  CO2 30 31 30  --  GLUCOSE 150* 172* 146* --  BUN 22 15 11  --  CREATININE 0.47* 0.46* 0.41* --  LABCREA -- -- -- --  CREAT24HRUR -- -- -- --  CALCIUM 7.9* 7.7* 7.5* --  MG 1.6 -- -- --  PHOS 3.7 -- -- --  PROT 4.4* -- -- 3.7*  ALBUMIN 1.8* -- -- --  AST 20 -- -- --  ALT 12 -- -- --  ALKPHOS 148* -- -- --  BILITOT 0.4 -- -- --  BILIDIR -- -- -- --  IBILI -- -- -- --  PREALBUMIN -- -- -- --  TRIG -- -- -- --  CHOLHDL -- -- -- --  CHOL -- -- -- 64  Corrected Cal 9/9 = 9.66 (wnl) Estimated Creatinine Clearance: 48.5 ml/min (by C-G formula based on Cr of 0.47).    Basename 11/30/11 2334 11/30/11 1956 11/30/11 1633  GLUCAP 138* 128* 187*   CBGs & Insulin requirements past 24 hours:   CBGs: 128-138 (better after new TNA with insulin added)  Resistant SSI Q4h: 21 units given  Regular insulin in TNA bag: 20 units/2L Clinimix  Nutritional Goals:   RD recs 11/26/11: 1468 kcals, 80-90 gm protein  daily  Clinimix 5/20 goal rate of 60 ml/hr (+ Lipids MWF) to provide: 72 g/day protein and an average of 1472 Kcal/day (1746 Kcal/day MWF, 1267 Kcal/day STTHS).  Current nutrition:   Diet: NPO  IVF: NS at [facility]  Assessment:   78 yo F admit on 8/30 w/ ruptured sigmoid diverticulum, peritonitis, and pelvic abscess. S/p laparotomy 8/31.  TNA started 9/4 for prolonged ileus.   CT abd/pelvis 9/6 Cannot exclude infected ascites or developing early abscess (MD doesn't think it's impressive).  Of note, she has B-Cell Lymphoma currently receiving chemotherapy.   CBGs better since insulin added to TNA 9/7  Renal/hepatic function:  WNL  I/O = 2715/2615 (NG=73ml)  Electrolytes: plan to keep K > 4 for ileus, Na and Cl improved to wnl. Mag a t low end normal. Corrected Ca WNL.  Pre-Albumin: 11.7 (9/4)  TG/Cholesterol: wnl 9/5  GI prophylaxis:  pantoprazole  Plan:  At 1800 tonight Continue Clinimix E 5/20 at goal rate of 60 ml/hr.  TNA to contain fat emulsion, standard multivitamins and trace elements MWF only due to ongoing shortage Continue IVF at [facility]  KCl x 3 runs to keep K > 4 given ileus Magnesium 2gm  IV x 1 Continue with 30 units insulin per 2L TNA bag (inc from 20-30 last pm) to decrease SSI needs (Appears SSI need decreased since start of new TNA bag last pm) TNA lab panels on Mondays & Thursdays. F/u daily.  Juliette Alcide, PharmD, BCPS.   Pager: [phone] 12/01/2011 7:21 AM

## 2011-12-01 NOTE — Progress Notes (Signed)
Pt has chest PT, was NT suctioned,  respitory rate still in the 30s, has coarse breath sound, CVP 11.  E-link MD notified, order for IV lasix received. Will continue to monitor

## 2011-12-01 NOTE — Progress Notes (Signed)
Patient ID: [name], female   DOB: 10-26-1931, 78 y.o.   MRN: [id number] 9 Days Post-Op  Subjective: Pt sedated.  Being intubated during my evaluation.    Objective: Vital signs in last 24 hours: Temp:  [98.1 F (36.7 C)-98.9 F (37.2 C)] 98.2 F (36.8 C) (09/09 0800) Pulse Rate:  [102-137] 108  (09/09 0920) Resp:  [16-35] 32  (09/09 0920) BP: (103-159)/(62-93) 138/77 mmHg (09/09 0920) SpO2:  [82 %-100 %] 97 % (09/09 0920) FiO2 (%):  [70 %-100 %] 70 % (09/09 0838) Last BM Date:  (colostomy)  Intake/Output from previous day: 09/08 0701 - 09/09 0700 In: 2639.6 [I.V.:546.6; IV Piggyback:664; TPN:1429] Out: 2615 [Urine:2535; Emesis/NG output:50; Stool:30] Intake/Output this shift: Total I/O In: 210 [I.V.:40; IV Piggyback:50; TPN:120] Out: 125 [Urine:125]  PE: Abd: soft, wound with some cloudy tan drainage from the most superior portion of wound.  Will need to be opened once she is intubated.  Other open areas are clean, but pale pink with minimal healing.  Ostomy with minimal output.  Stoma is pink  Lab Results:   Basename 12/01/11 0408 11/30/11 0500  WBC 49.4* 34.8*  HGB 8.8* 7.5*  HCT 26.4* 22.1*  PLT 305 227   BMET  Basename 12/01/11 0408 11/30/11 0500  NA 141 137  K 3.5 3.8  CL 102 103  CO2 30 31  GLUCOSE 150* 172*  BUN 22 15  CREATININE 0.47* 0.46*  CALCIUM 7.9* 7.7*   PT/INR No results found for this basename: LABPROT:2,INR:2 in the last 72 hours CMP     Component Value Date/Time   NA 141 12/01/2011 0408   NA 134* 11/20/2011 1055   K 3.5 12/01/2011 0408   K 2.9* 11/20/2011 1055   CL 102 12/01/2011 0408   CL 96* 11/20/2011 1055   CO2 30 12/01/2011 0408   CO2 26 11/20/2011 1055   GLUCOSE 150* 12/01/2011 0408   GLUCOSE 167* 11/20/2011 1055   BUN 22 12/01/2011 0408   BUN 11.0 11/20/2011 1055   CREATININE 0.47* 12/01/2011 0408   CREATININE 0.8 11/20/2011 1055   CALCIUM 7.9* 12/01/2011 0408   PROT 4.4* 12/01/2011 0408   PROT 5.4* 11/20/2011 1055   ALBUMIN 1.8*  12/01/2011 0408   ALBUMIN 2.8* 11/20/2011 1055   AST 20 12/01/2011 0408   AST 13 11/20/2011 1055   ALT 12 12/01/2011 0408   ALT 14 11/20/2011 1055   ALKPHOS 148* 12/01/2011 0408   ALKPHOS 97 11/20/2011 1055   BILITOT 0.4 12/01/2011 0408   BILITOT 0.90 11/20/2011 1055   GFRNONAA >90 12/01/2011 0408   GFRAA >90 12/01/2011 0408   Lipase     Component Value Date/Time   LIPASE 9* 11/21/2011 1909       Studies/Results: Dg Chest Port 1 View  12/01/2011  *RADIOLOGY REPORT*  Clinical Data: Congestive heart failure.  Atelectasis.  PORTABLE CHEST - 1 VIEW  Comparison: 11/30/2011  Findings: Endotracheal tube and nasogastric tube have been removed. Right-sided power port is unchanged with its tip in the SVC just above the right atrium.  Left subclavian central line is unchanged with its tip at the SVC/RA junction or proximal right atrium. Pulmonary edema persists and is possibly slightly worsened. Bilateral effusions persist with atelectasis.  IMPRESSION: Suspicion of worsening edema, effusions and atelectasis following extubation.   Original Report Authenticated By: Thomasenia Sales, M.D.    Dg Chest Port 1 View  11/30/2011  *RADIOLOGY REPORT*  Clinical Data: Evaluate endotracheal tube placement.  Congestive heart failure.  PORTABLE CHEST - 1 VIEW  Comparison: Chest x-ray 11/29/2011.  Findings: An endotracheal tube is in place with tip 3.7 cm above the carina. A nasogastric tube is seen extending into the stomach, however, the tip of the nasogastric tube extends below the lower margin of the image.  There is a left-sided subclavian central venous catheter with tip terminating in the superior cavoatrial junction. Right IJ a single lumen power Port-A-Cath with tip terminating at the superior cavoatrial junction.  Lung volumes remain low.  Increasing opacity at the right base.  Unchanged opacity throughout the mid and lower left lung.  Blunting of both costophrenic sulci, increased on the right.  Cephalization of the pulmonary  vasculature.  Interstitial markings are mildly indistinct.  Cardiac silhouette is obscured.  Mediastinal contours are distorted by patient positioning.  Atherosclerosis in the thoracic aorta.  IMPRESSION: 1.  Support apparatus, as above. 2.  Mild pulmonary edema is unchanged. 3.  Increasing small right and moderate left pleural effusions. 4.  Worsening atelectasis and/or consolidation in the lung bases bilaterally.   Original Report Authenticated By: Florencia Reasons, M.D.     Anti-infectives: Anti-infectives     Start     Dose/Rate Route Frequency Ordered Stop   11/29/11 1000   aztreonam (AZACTAM) 2 g in dextrose 5 % 50 mL IVPB        2 g 100 mL/hr over 30 Minutes Intravenous Every 8 hours 11/29/11 0907     11/28/11 1200   aztreonam (AZACTAM) 2 g in dextrose 5 % 50 mL IVPB  Status:  Discontinued        2 g 100 mL/hr over 30 Minutes Intravenous Every 12 hours 11/28/11 0240 11/28/11 0903   11/28/11 1000   fluconazole (DIFLUCAN) IVPB 200 mg        200 mg 100 mL/hr over 60 Minutes Intravenous Every 24 hours 11/28/11 0915     11/28/11 0200   micafungin (MYCAMINE) 100 mg in sodium chloride 0.9 % 100 mL IVPB  Status:  Discontinued     Comments: Pharmacy to verify the dose      100 mg 100 mL/hr over 1 Hours Intravenous Daily at bedtime 11/28/11 0133 11/28/11 0903   11/28/11 0130   aztreonam (AZACTAM) 2 g in dextrose 5 % 50 mL IVPB        2 g 100 mL/hr over 30 Minutes Intravenous  Once 11/28/11 0130 11/28/11 0230   11/23/11 0300   fluconazole (DIFLUCAN) IVPB 200 mg  Status:  Discontinued        200 mg 100 mL/hr over 60 Minutes Intravenous Every 24 hours 11/22/11 0254 11/26/11 1442   11/22/11 0300   fluconazole (DIFLUCAN) IVPB 400 mg        400 mg 200 mL/hr over 60 Minutes Intravenous  Once 11/22/11 0254 11/22/11 0518   11/22/11 0230   metroNIDAZOLE (FLAGYL) IVPB 500 mg  Status:  Discontinued        500 mg 100 mL/hr over 60 Minutes Intravenous  Once 11/22/11 0229 11/22/11 0230    11/21/11 2230   ciprofloxacin (CIPRO) IVPB 400 mg  Status:  Discontinued        400 mg 200 mL/hr over 60 Minutes Intravenous 2 times daily 11/21/11 2207 11/29/11 0840   11/21/11 2230   metroNIDAZOLE (FLAGYL) IVPB 500 mg        500 mg 100 mL/hr over 60 Minutes Intravenous 3 times per day 11/21/11 2207     11/21/11 2100   meropenem (  MERREM) 1 g in sodium chloride 0.9 % 100 mL IVPB  Status:  Discontinued        1 g 200 mL/hr over 30 Minutes Intravenous 3 times per day 11/21/11 2013 11/21/11 2146           Assessment/Plan  1. S/p ex lap with Hartman's for perf sigmoid colon 2. VDRF 3. Leukocytosis 4. Septic shock 5. PCM/TNA  Plan: 1. Will open up wound more and begin wet to dry dressing changes to this new area. 2. D/W ccm about increasing WBC.  They will vanc to her abx regimen. 3. Cont NPO and TNA, currently 4. Had a CT scan just within last few days that did not show any major evidence for the source of her WBC in her abdomen.  Will follow. 5. Appreciate CCM assistance with this patient.   LOS: 10 days    Fareeda Downard E 12/01/2011

## 2011-12-01 NOTE — Progress Notes (Signed)
Chaplain provided brief support with husband Chrissie Noa) around pt's re-intubation.  Chrissie Noa hopeful that intubation will clear respiratory issues.  wished to leave the hospital to complete errands while pt was sleeping.    Chaplain will continue to follow for support as needed.    Belva Crome  MDiv, Chaplain

## 2011-12-01 NOTE — Progress Notes (Signed)
Name: [name] MRN: [id number] DOB: [birth year]    LOS: 10  Referring Provider:  Dr. Gerrit Friends  Reason for Referral:  Septic shock  PULMONARY / CRITICAL CARE MEDICINE  HPI:  78 yo female admitted 11/21/2011 5 days of abdominal pain.  Found to have perforated sigmoid diverticulum with peritonitis, and pelvic abscess.  Had laparotomy 8/31.  Developed respiratory failure and shock post-op.  PCCM consulted 8/31.  She has hx of B cell lymphoma currently on chemo as outpt.  Events Since Admission: 8/31 Laparotomy, sigmoid colectomy and ostomy 9/01 A fib with RVR 9/02 Off pressors 9/06 A fib RVR>>back to sinus; added precedex for agitation; hypotensive.  Rt thoracentesis (transudate).  Subjective:  Tolerating BiPAP.  Awake follows some commands.  Vital Signs: Temp:  [98.1 F (36.7 C)-98.9 F (37.2 C)] 98.2 F (36.8 C) (09/09 0800) Pulse Rate:  [102-137] 102  (09/09 0630) Resp:  [15-35] 23  (09/09 0630) BP: (103-159)/(56-93) 139/85 mmHg (09/09 0630) SpO2:  [82 %-100 %] 100 % (09/09 0630) FiO2 (%):  [100 %] 100 % (09/09 0630)  Intake/Output Summary (Last 24 hours) at 12/01/11 0824 Last data filed at 12/01/11 0800  Gross per 24 hour  Intake   2404 ml  Output   2415 ml  Net    -11 ml   Physical Examination: General: Ill appearing Neuro: Somnolent, moves extremities, no some commands HEENT: On BiPAP 5 over 5 Neck:  Supple Cardiovascular: s1s2 regular, no murmur Lungs: decreased breath sounds, slight expiratory wheeze, basilar rales Abdomen:  Wound dressings clean. Stoma red small amount of stool. Musculoskeletal:  No edema Skin:  No rashes.     ASSESSMENT AND PLAN  PULMONARY  Lab 11/28/11 0234 11/28/11 0221 11/27/11 2359 11/25/11 0445  PHART -- -- 7.475* 7.444  PCO2ART -- -- 35.9 33.5*  PO2ART -- -- 56.3* 89.1  HCO3 -- -- 26.4* 22.7  O2SAT 43.3 38.3 89.2 98.7    Dg Chest Port 1 View  12/01/2011  *RADIOLOGY REPORT*  Clinical Data: Congestive heart failure.   Atelectasis.  PORTABLE CHEST - 1 VIEW  Comparison: 11/30/2011  Findings: Endotracheal tube and nasogastric tube have been removed. Right-sided power port is unchanged with its tip in the SVC just above the right atrium.  Left subclavian central line is unchanged with its tip at the SVC/RA junction or proximal right atrium. Pulmonary edema persists and is possibly slightly worsened. Bilateral effusions persist with atelectasis.  IMPRESSION: Suspicion of worsening edema, effusions and atelectasis following extubation.   Original Report Authenticated By: Thomasenia Sales, M.D.     Ventilator Settings: BiPAP 5/5  ETT:  8/31 >>   A: Acute respiratory failure in setting of septic shock from peritonitis.  Mental status barrier to extubation. Extubated on 9/8. Patient respirations 40 per minutes on BiPAP. CXR w/ progressive bilateral airspace disease. Likely pulmonary edema.  P:   F/u CXR No hx of obstructive lung disease or smoking>>changed BD's to prn Discuss with family about trach and PEG. Low dose diuretics, follow up serial cxr  CARDIOVASCULAR  Lab 11/28/11 1150 11/28/11 0240 11/28/11 0230 11/27/11 2350  TROPONINI <0.30 <0.30 -- 0.38*  LATICACIDVEN -- -- 2.1 --  PROBNP -- -- -- --   Lines:  Lt Sh[place] CVL 8/31 >>   Echo 9/01>>periapical akinesis, EF 25 to 30%, diffuse hypokinesis, grade 1 diastolic dysfx, mod AR, mild MR, mod TR, mild/mod RV systolic dysfx, PAS 37 mmHg  A:  1) Septic shock 2nd to peritonitis>>resolved. 2) A fib with  RVR>>back in sinus 9/02. 3) Acute systolic CHF>>?if this is sepsis related cardiomyopathy. 4) Hypotension 9/06 more likely related to diuresis, sedation and lopressor use rather than recurrent sepsis (she is negative 5 liters since 9/03). Once again in pulmonary edema.  P:  Keep CVP > 8 Hold lopressor for now Change solu-cortef to 50 mg q12h May need cardiology to re-assess  RENAL  Lab 12/01/11 0408 11/30/11 0500 11/29/11 0500 11/28/11 0230 11/27/11 2350  11/27/11 1745 11/27/11 0530 11/26/11 1030  NA 141 137 135 131* 132* -- -- --  K 3.5 3.8 -- -- -- -- -- --  CL 102 103 97 94* 95* -- -- --  CO2 30 31 30 30 31  -- -- --  BUN 22 15 11 8 8  -- -- --  CREATININE 0.47* 0.46* 0.41* 0.51 0.51 -- -- --  CALCIUM 7.9* 7.7* 7.5* 6.7* 6.6* -- -- --  MG 1.6 -- -- -- 1.3* 1.4* 1.4* 1.8  PHOS 3.7 -- -- -- 2.9 3.3 2.0* 2.3   Intake/Output      09/08 0701 - 09/09 0700 09/09 0701 - 09/10 0700   I.V. (mL/kg) 506.6 (7.7)    IV Piggyback 664    TPN 1380    Total Intake(mL/kg) 2550.6 (38.8)    Urine (mL/kg/hr) 2535 (1.6) 125   Emesis/NG output 50    Stool 30    Total Output 2615 125   Net -64.4 -125        Urine Occurrence 1 x     Foley:  Placed 11/21/2011  A: Hypokalemia, hypomagnesemia P:   Replace and F/u BMET, electrolytes Monitor renal fx, urine outpt  GASTROINTESTINAL  Lab 12/01/11 0408 11/28/11 1715 11/27/11 2350 11/27/11 0530 11/25/11 0417  AST 20 -- 13 14 11   ALT 12 -- 9 12 8   ALKPHOS 148* -- 95 120* 104  BILITOT 0.4 -- 0.2* 0.2* 0.2*  PROT 4.4* 3.7* 3.4* 3.9* 3.3*  ALBUMIN 1.8* -- 1.5* 1.7* 1.2*    A:  Perforated sigmoid diverticulum s/p laparotomy>>CT abd/pelvis 9/06 not impressive for abscess, but very vague read. Surgery is following.   P:   Post-op care per [facility] TNA started 9/04 per [facility] ?when she can use her gut for meds/nutrition>>defer to [facility] Possible repeat CT scan next couple of days  HEMATOLOGIC  Lab 12/01/11 0408 11/30/11 0500 11/29/11 0500 11/28/11 0230 11/27/11 2350  HGB 8.8* 7.5* 8.6* 8.9* 8.8*  HCT 26.4* 22.1* 25.0* 25.5* 25.3*  PLT 305 227 197 158 142*  INR -- -- -- 1.41 --  APTT -- -- -- 36 --   A:  1)  Follicular B cell lymphoma, currently undergoing chemotherapy. 2)  Anemia of critical illness, and chronic disease. 3)  Thrombocytopenia likely related to sepsis/critical illness>>improved. P:  F/u CBC Transfuse for Hb < 7  INFECTIOUS  Lab 12/01/11 0408 11/30/11 0500 11/29/11 0500 11/28/11 0230  11/27/11 2350  WBC 49.4* 34.8* 36.5* 29.9* 28.6*  PROCALCITON -- -- -- 0.29 --   Cultures: Blood 8/31 >>negative Urine 8/31 >>negative Sputum 9/05 >>Oral flora Rt pleural fluid 9/06>> Urine 9/06: E coli (pansens) C diff 9/06>>negative Blood 9/06>>  Antibiotics: Cipro 8/31 >>9/06 Flagyl 8/31 >> Fluconazole 8/31 >> Aztreonam 9/07>>  A:   1) Peritonitis 2nd to sigmoid perforation>>improved. 2) Increased respiratory secretions 9/05>>improved 9/07. 3) Increasing WBC, but no fever>>she is getting solu cortef.  ?of infiltrate vs ATX on CXR/CT, and GNR in urine cx 9/06. Hx of PCN allergy. P:   D9/x flagyl, diflucan D3/x  aztreonam F/u cx results and adjust abx further.  ENDOCRINE  Lab 12/01/11 0755 12/01/11 0422 11/30/11 2334 11/30/11 1956 11/30/11 1633  GLUCAP 112* 154* 138* 128* 187*   A:  Hyperglycemia. P:   SSI  NEUROLOGIC  A:  Sedation.  CT head 9/06 unremarkable. P:   Try to limit sedation while weaning vent  BEST PRACTICE / DISPOSITION Level of Care:  ICU Primary Service:  Surgery Consultants: PCCM, Oncology Code Status:  Limited>>no CPR, no defibrillation Diet:  TPN DVT Px:  Lovenox GI Px:  Protonix Skin Integrity:  Good Social / Family: Updated family 9/06  Will reintubate, diurese and wean.  If able to extubate then will but if not then will need to discuss option of tracheostomy with the family.  CC time 40 min.  Patient seen and examined, agree with above note.  I dictated the care and orders written for this patient under my direction.  Koren Bound, M.D. [phone]

## 2011-12-01 NOTE — Clinical Social Work Note (Signed)
CSW met with Pt's husband for support after Pt had to be reintubated. Husband visibly upset and appreciative of support from staff. He denied needs currently, but wants CSW to keep checking on him.   Doreen Salvage, LCSWA ICU/Stepdown Clinical Social Worker [facility] Cell [phone] Hours 8am-1200pm M-F

## 2011-12-01 NOTE — Progress Notes (Signed)
Pt placed on Bipap as ordered; for decreasing saturations, tachypenea and increased work of breathing. will continue to monitor

## 2011-12-01 NOTE — Progress Notes (Signed)
CARE MANAGEMENT NOTE 12/01/2011  Patient:  [name], [name]   Account Number:  [id number]  Date Initiated:  11/24/2011  Documentation initiated by:  DAVIS,RHONDA  Subjective/Objective Assessment:   pt with perforated bowel requiring open lap withformation of colostomy s/p colon resection, icu-vented [id number], now septic and on vaso pressors     Action/Plan:   lives at home with family   Anticipated DC Date:  12/04/2011   Anticipated DC Plan:  HOME/SELF CARE  In-house referral  NA      DC Planning Services  NA      [facility] Choice  NA   Choice offered to / List presented to:  NA   DME arranged  NA      DME agency  NA     HH arranged  NA      HH agency  NA   Status of service:  In process, will continue to follow Medicare Important Message given?  NA - LOS <3 / Initial given by admissions (If response is "NO", the following Medicare IM given date fields will be blank) Date Medicare IM given:   Date Additional Medicare IM given:    Discharge Disposition:    Per UR Regulation:  Reviewed for med. necessity/level of care/duration of stay  If discussed at Long Length of Stay Meetings, dates discussed:    Comments:  [id number]/Rhonda Davis,Rn,BSn,CCM patient was intubated am on [id number]: vent day 1 was extubated on [id number]  Scarlette Slice, RN, BSN, CCM: CHART REVIEWED AND UPDATED. NO DISCHARGE NEEDS PRESENT AT THIS TIME. CASE MANAGEMENT [phone]

## 2011-12-01 NOTE — Progress Notes (Signed)
Wound now open and otherwise healthy, may need to repeat scan next 48 hours to see if vague fluid collection needs any work but I don't know that her current wbc is intra-abdominal in nature.

## 2011-12-01 NOTE — Procedures (Signed)
Intubation Procedure Note LOANNE EMERY [id number] 1931/11/01  Procedure: Intubation Indications: Airway protection and maintenance  Procedure Details Consent: Risks of procedure as well as the alternatives and risks of each were explained to the (patient/caregiver).  Consent for procedure obtained. Time Out: Verified patient identification, verified procedure, site/side was marked, verified correct patient position, special equipment/implants available, medications/allergies/relevent history reviewed, required imaging and test results available.  Performed  Maximum sterile technique was used including antiseptics, cap and gloves.  MAC    Evaluation Hemodynamic Status: BP stable throughout; O2 sats: stable throughout Patient's Current Condition: stable Complications: No apparent complications Patient did tolerate procedure well. Chest X-ray ordered to verify placement.  CXR: pending.   Koren Bound 12/01/2011

## 2011-12-01 NOTE — Progress Notes (Addendum)
ANTIBIOTIC CONSULT NOTE - INITIAL  Pharmacy Consult for vancomycin/aztreonam Indication: perforated colon w/ peritonitis, possible PNA  Allergies  Allergen Reactions  . Codeine Hives  . Other Nausea And Vomiting    anesthesia  . Penicillins Hives  . Sulfa Antibiotics Hives    Patient Measurements: Height: 4\' 11"  (149.9 cm) Weight: 144 lb 13.5 oz (65.7 kg) IBW/kg (Calculated) : 43.2  Adjusted Body Weight:   Vital Signs: Temp: 98.2 F (36.8 C) (09/09 0800) Temp src: Axillary (09/09 0800) BP: 138/77 mmHg (09/09 0920) Pulse Rate: 108  (09/09 0920) Intake/Output from previous day: 09/08 0701 - 09/09 0700 In: 2639.6 [I.V.:546.6; IV Piggyback:664; TPN:1429] Out: 2615 [Urine:2535; Emesis/NG output:50; Stool:30] Intake/Output from this shift: Total I/O In: 210 [I.V.:40; IV Piggyback:50; TPN:120] Out: 125 [Urine:125]  Labs:  Basename 12/01/11 0408 11/30/11 0500 11/29/11 0500  WBC 49.4* 34.8* 36.5*  HGB 8.8* 7.5* 8.6*  PLT 305 227 197  LABCREA -- -- --  CREATININE 0.47* 0.46* 0.41*   Estimated Creatinine Clearance: 48.5 ml/min (by C-G formula based on Cr of 0.47). No results found for this basename: VANCOTROUGH:2,VANCOPEAK:2,VANCORANDOM:2,GENTTROUGH:2,GENTPEAK:2,GENTRANDOM:2,TOBRATROUGH:2,TOBRAPEAK:2,TOBRARND:2,AMIKACINPEAK:2,AMIKACINTROU:2,AMIKACIN:2, in the last 72 hours   Microbiology: Recent Results (from the past 720 hour(s))  CULTURE, BLOOD (SINGLE)     Status: Normal   Collection Time   11/21/11  9:33 PM      Component Value Range Status Comment   Specimen Description BLOOD RIGHT ARM   Final    Special Requests BOTTLES DRAWN AEROBIC AND ANAEROBIC   Final    Culture  Setup Time 11/22/2011 05:00   Final    Culture NO GROWTH 5 DAYS   Final    Report Status 11/28/2011 FINAL   Final   MRSA PCR SCREENING     Status: Normal   Collection Time   11/22/11  1:04 AM      Component Value Range Status Comment   MRSA by PCR NEGATIVE  NEGATIVE Final   CULTURE, BLOOD  (ROUTINE X 2)     Status: Normal   Collection Time   11/22/11  3:30 AM      Component Value Range Status Comment   Specimen Description BLOOD LEFT ANTECUBITAL   Final    Special Requests BOTTLES DRAWN AEROBIC AND ANAEROBIC 5CC   Final    Culture  Setup Time 11/22/2011 11:22   Final    Culture NO GROWTH 5 DAYS   Final    Report Status 11/28/2011 FINAL   Final   URINE CULTURE     Status: Normal   Collection Time   11/22/11  4:05 AM      Component Value Range Status Comment   Specimen Description URINE, CATHETERIZED   Final    Special Requests NONE   Final    Culture  Setup Time 11/22/2011 11:21   Final    Colony Count NO GROWTH   Final    Culture NO GROWTH   Final    Report Status 11/23/2011 FINAL   Final   CULTURE, RESPIRATORY     Status: Normal   Collection Time   11/27/11 11:52 AM      Component Value Range Status Comment   Specimen Description TRACHEAL ASPIRATE   Final    Special Requests Normal   Final    Gram Stain     Final    Value: RARE WBC PRESENT,BOTH PMN AND MONONUCLEAR     NO SQUAMOUS EPITHELIAL CELLS SEEN     NO ORGANISMS SEEN   Culture Non-Pathogenic Oropharyngeal-type  Flora Isolated.   Final    Report Status 11/29/2011 FINAL   Final   URINE CULTURE     Status: Normal   Collection Time   11/28/11  1:10 AM      Component Value Range Status Comment   Specimen Description Urine   Final    Special Requests Normal   Final    Culture  Setup Time 11/28/2011 08:47   Final    Colony Count 15,000 COLONIES/ML   Final    Culture ESCHERICHIA COLI   Final    Report Status 11/30/2011 FINAL   Final    Organism ID, Bacteria ESCHERICHIA COLI   Final   CLOSTRIDIUM DIFFICILE BY PCR     Status: Normal   Collection Time   11/28/11  1:10 AM      Component Value Range Status Comment   C difficile by pcr NEGATIVE  NEGATIVE Final   CULTURE, BLOOD (ROUTINE X 2)     Status: Normal (Preliminary result)   Collection Time   11/28/11  1:23 AM      Component Value Range Status Comment   Specimen  Description BLOOD LEFT ARM   Final    Special Requests BOTTLES DRAWN AEROBIC AND ANAEROBIC 2.5   Final    Culture  Setup Time 11/28/2011 08:38   Final    Culture     Final    Value:        BLOOD CULTURE RECEIVED NO GROWTH TO DATE CULTURE WILL BE HELD FOR 5 DAYS BEFORE ISSUING A FINAL NEGATIVE REPORT   Report Status PENDING   Incomplete   BODY FLUID CULTURE     Status: Normal (Preliminary result)   Collection Time   11/28/11  3:36 PM      Component Value Range Status Comment   Specimen Description PLEURAL   Final    Special Requests Normal   Final    Gram Stain     Final    Value: NO WBC SEEN     NO ORGANISMS SEEN   Culture NO GROWTH 2 DAYS   Final    Report Status PENDING   Incomplete     Medical History: Past Medical History  Diagnosis Date  . Cancer 07/08/11    follicular B cell lymphoma  . TIA (transient ischemic attack)   . Arthritis   . Hypertension   . Esophageal abnormality     strictures  . Arthritis 09/04/2011  . Dehydration 11/20/2011  . Diarrhea 11/20/2011  . Complication of anesthesia   . PONV (postoperative nausea and vomiting)     in the 1980's  . GERD (gastroesophageal reflux disease)   . Esophageal stricture     x 2    Medications:  Scheduled:    . acetylcysteine  3 mL Nebulization QID  . ipratropium  0.5 mg Nebulization QID   And  . albuterol  2.5 mg Nebulization QID  . antiseptic oral rinse  15 mL Mouth Rinse QID  . aztreonam  2 g Intravenous Q8H  . chlorhexidine  15 mL Mouth Rinse BID  . enoxaparin (LOVENOX) injection  40 mg Subcutaneous Q24H  . etomidate      . fluconazole (DIFLUCAN) IV  200 mg Intravenous Q24H  . furosemide  20 mg Intravenous Q6H  . furosemide  40 mg Intravenous Once  . hydrocortisone sodium succinate  50 mg Intravenous Q12H  . insulin aspart  0-20 Units Subcutaneous Q4H  . lidocaine (cardiac) 100 mg/34ml      .  magnesium sulfate 1 - 4 g bolus IVPB  2 g Intravenous Once  . metoprolol  2.5 mg Intravenous Once  . metoprolol  5  mg Intravenous Q6H  . metronidazole  500 mg Intravenous Q8H  . midazolam      . pantoprazole (PROTONIX) IV  40 mg Intravenous QHS  . potassium chloride  10 mEq Intravenous Q1 Hr x 2  . potassium chloride  10 mEq Intravenous Q1 Hr x 3  . rocuronium      . succinylcholine      . DISCONTD: metoprolol  2.5 mg Intravenous Q6H   Assessment: HPI: 78 year old female with B-cell lymphoma undergoing chemotherapy presents 8/30 with worsening abdominal pain. CT showed perforated diverticular disease with abscesses. IV Cipro and Flagyl were initiated with plans for laparotomy with resection and colostomy on 8/31. Pt transferred to ICU. Developed respiratory failure and worsening septic shock. Pt intubated and septic shock protocol initiated.   8/30 >> Flagyl >> 8/30 >> Cipro >> 9/7 8/31 >> Fluconazole >> 9/4, restart 9/6 >>  9/5 Aztreonam x 1 9/6 Micafungin x 1  9/7 >> aztreonam >> 9/9 >> vancomycin >>  Tmax: afebrile WBCs: Elevated (WBC incr likely d/t previous admin of growth factor) Renal: Scr low, CrCl 49  8/30 blood: ng-final 8/31 blood: ng-final 8/31 mrsa pcr: neg 8/31 urine: ng-final 9/5 trach aspirate: normal flora 9/6 urine:15K e. Coli (R to amp, FQ) 9/6 c.diff: neg 9/6 blood x1: NGTD 9/6: pleural fluid: NGTD 9/5 trach aspirate:Nl flora  Today is D11 Flagyl, D10 fluconazole, D3 aztreonam, renal function is stable, cultures unrevealing.  WBC is elevated, s/p Neulasta on 8/24, suspect contributing to elevation in WBC.  Orders to add vancomycin for possible Pneumonia   Plan:  -vancomycin 1250mg  IV x 1 then 500mg  IV q12h -Continue aztreonam and other antibiotics at current doses -Follow renal function and check vancomycin trough if continues on vancomycin >= 3 days or sooner if change in renal fx.   Dannielle Huh 12/01/2011,10:15 AM

## 2011-12-02 ENCOUNTER — Inpatient Hospital Stay (HOSPITAL_COMMUNITY): Payer: Medicare Other

## 2011-12-02 LAB — BLOOD GAS, ARTERIAL
Bicarbonate: 33.4 mEq/L — ABNORMAL HIGH (ref 20.0–24.0)
Drawn by: 244901
O2 Saturation: 97.5 %
PEEP: 10 cmH2O
RATE: 16 resp/min
pCO2 arterial: 44 mmHg (ref 35.0–45.0)
pH, Arterial: 7.496 — ABNORMAL HIGH (ref 7.350–7.450)
pO2, Arterial: 92.3 mmHg (ref 80.0–100.0)

## 2011-12-02 LAB — BODY FLUID CULTURE: Gram Stain: NONE SEEN

## 2011-12-02 LAB — CBC
MCHC: 35 g/dL (ref 30.0–36.0)
MCV: 91.1 fL (ref 78.0–100.0)
Platelets: 239 10*3/uL (ref 150–400)
RDW: 17.3 % — ABNORMAL HIGH (ref 11.5–15.5)
WBC: 27.5 10*3/uL — ABNORMAL HIGH (ref 4.0–10.5)

## 2011-12-02 LAB — GLUCOSE, CAPILLARY

## 2011-12-02 LAB — BASIC METABOLIC PANEL
Chloride: 102 mEq/L (ref 96–112)
Creatinine, Ser: 0.5 mg/dL (ref 0.50–1.10)
GFR calc Af Amer: >90 mL/min (ref >90)
GFR calc non Af Amer: >90 mL/min (ref >90)

## 2011-12-02 LAB — MAGNESIUM: Magnesium: 1.7 mg/dL (ref 1.5–2.5)

## 2011-12-02 MED ORDER — POTASSIUM PHOSPHATE DIBASIC 3 MMOLE/ML IV SOLN
30.0000 mmol | Freq: Once | INTRAVENOUS | Status: AC
  Administered 2011-12-02: 30 mmol via INTRAVENOUS
  Filled 2011-12-02: qty 10

## 2011-12-02 MED ORDER — INSULIN REGULAR HUMAN 100 UNIT/ML IJ SOLN
INTRAMUSCULAR | Status: AC
  Administered 2011-12-02: 18:00:00 via INTRAVENOUS
  Filled 2011-12-02: qty 2000

## 2011-12-02 MED ORDER — FUROSEMIDE 10 MG/ML IJ SOLN
40.0000 mg | Freq: Four times a day (QID) | INTRAMUSCULAR | Status: AC
  Administered 2011-12-02 (×3): 40 mg via INTRAVENOUS
  Filled 2011-12-02 (×3): qty 4

## 2011-12-02 MED ORDER — VITAL AF 1.2 CAL PO LIQD
1000.0000 mL | ORAL | Status: DC
  Administered 2011-12-02: 1000 mL
  Filled 2011-12-02 (×2): qty 1000

## 2011-12-02 MED ORDER — MAGNESIUM SULFATE 40 MG/ML IJ SOLN
2.0000 g | Freq: Once | INTRAMUSCULAR | Status: AC
  Administered 2011-12-02: 2 g via INTRAVENOUS
  Filled 2011-12-02: qty 50

## 2011-12-02 MED ORDER — DEXMEDETOMIDINE HCL IN NACL 400 MCG/100ML IV SOLN
0.2000 ug/kg/h | INTRAVENOUS | Status: AC
  Administered 2011-12-02 (×2): 1.2 ug/kg/h via INTRAVENOUS
  Administered 2011-12-03: 0.9 ug/kg/h via INTRAVENOUS
  Administered 2011-12-03 – 2011-12-04 (×6): 1.2 ug/kg/h via INTRAVENOUS
  Filled 2011-12-02 (×9): qty 100

## 2011-12-02 MED ORDER — HYDROCORTISONE SOD SUCCINATE 100 MG IJ SOLR
25.0000 mg | Freq: Two times a day (BID) | INTRAMUSCULAR | Status: DC
  Administered 2011-12-02 – 2011-12-09 (×14): 25 mg via INTRAVENOUS
  Filled 2011-12-02 (×8): qty 0.5
  Filled 2011-12-02: qty 2
  Filled 2011-12-02 (×6): qty 0.5

## 2011-12-02 MED ORDER — POTASSIUM CHLORIDE 10 MEQ/50ML IV SOLN
10.0000 meq | INTRAVENOUS | Status: AC
  Administered 2011-12-02 (×5): 10 meq via INTRAVENOUS
  Filled 2011-12-02 (×5): qty 50

## 2011-12-02 NOTE — Progress Notes (Signed)
Nutrition Follow-up- consult for Trickle TF  Intervention:  Begin Trickle TF with Vital Af 1.2 at 10 ml/hr.  Goal is 40 ml/hr which will provide:  1152 kcal, 72 gm protein.  Add prostat 30 ml daily to meet protein needs when TF tolerance established and tpn d/ced.  Assessment:   Pt remains intubated. Perforated sigmoid diverticulum with peritonitis and pelvic abscess.  Pt with B cell lymphoma currently on chemo as outpt.  To begin trickly TF today.  Pt's son's are physicians.   TPN: Clinimix E 5/20 at goal rate of 60 ml/hr plus 20% lipids at 10 ml/hr MWF providing an average of 1472 kcal per day and 72 gm protein.  Diet Order:  NPO  Meds: Scheduled Meds:   . acetylcysteine  3 mL Nebulization QID  . ipratropium  0.5 mg Nebulization QID   And  . albuterol  2.5 mg Nebulization QID  . antiseptic oral rinse  15 mL Mouth Rinse QID  . aztreonam  2 g Intravenous Q8H  . chlorhexidine  15 mL Mouth Rinse BID  . enoxaparin (LOVENOX) injection  40 mg Subcutaneous Q24H  . fluconazole (DIFLUCAN) IV  200 mg Intravenous Q24H  . furosemide  20 mg Intravenous Q6H  . furosemide  40 mg Intravenous Q6H  . hydrocortisone sodium succinate  25 mg Intravenous Q12H  . insulin aspart  0-20 Units Subcutaneous Q4H  . lidocaine (cardiac) 100 mg/57ml      . magnesium sulfate 1 - 4 g bolus IVPB  2 g Intravenous Once  . metoprolol  5 mg Intravenous Q6H  . metronidazole  500 mg Intravenous Q8H  . midazolam      . midazolam      . pantoprazole (PROTONIX) IV  40 mg Intravenous QHS  . potassium chloride  10 mEq Intravenous Once  . potassium chloride  10 mEq Intravenous Q1 Hr x 5  . potassium chloride      . potassium phosphate IVPB (mmol)  30 mmol Intravenous Once  . rocuronium      . succinylcholine      . vancomycin  500 mg Intravenous Q12H  . DISCONTD: hydrocortisone sodium succinate  50 mg Intravenous Q12H   Continuous Infusions:   . sodium chloride 20 mL/hr (12/01/11 0800)  . sodium chloride 20 mL  (12/01/11 2359)  . sodium chloride    . dexmedetomidine 1.2 mcg/kg/hr (12/02/11 1400)  . TPN (CLINIMIX) +/- additives 60 mL/hr at 12/01/11 1755   And  . fat emulsion 250 mL (12/02/11 1100)  . TPN (CLINIMIX) +/- additives 60 mL/hr at 12/01/11 0800  . TPN (CLINIMIX) +/- additives    . DISCONTD: phenylephrine (NEO-SYNEPHRINE) Adult infusion 20 mcg/min (11/30/11 0800)   PRN Meds:.albuterol, albuterol, fentaNYL, ipratropium, midazolam, ondansetron, DISCONTD: fentaNYL, DISCONTD: fentaNYL, DISCONTD: midazolam  Labs:  CMP     Component Value Date/Time   NA 141 12/02/2011 0500   NA 134* 11/20/2011 1055   K 2.9* 12/02/2011 0500   K 2.9* 11/20/2011 1055   CL 102 12/02/2011 0500   CL 96* 11/20/2011 1055   CO2 34* 12/02/2011 0500   CO2 26 11/20/2011 1055   GLUCOSE 145* 12/02/2011 0500   GLUCOSE 167* 11/20/2011 1055   BUN 27* 12/02/2011 0500   BUN 11.0 11/20/2011 1055   CREATININE 0.50 12/02/2011 0500   CREATININE 0.8 11/20/2011 1055   CALCIUM 7.3* 12/02/2011 0500   PROT 4.4* 12/01/2011 0408   PROT 5.4* 11/20/2011 1055   ALBUMIN 1.8* 12/01/2011 0408   ALBUMIN  2.8* 11/20/2011 1055   AST 20 12/01/2011 0408   AST 13 11/20/2011 1055   ALT 12 12/01/2011 0408   ALT 14 11/20/2011 1055   ALKPHOS 148* 12/01/2011 0408   ALKPHOS 97 11/20/2011 1055   BILITOT 0.4 12/01/2011 0408   BILITOT 0.90 11/20/2011 1055   GFRNONAA >90 12/02/2011 0500   GFRAA >90 12/02/2011 0500     Intake/Output Summary (Last 24 hours) at 12/02/11 1411 Last data filed at 12/02/11 1400  Gross per 24 hour  Intake 3367.63 ml  Output   2230 ml  Net 1137.63 ml    Weight Status:  63.4 kg (52.9-69 kg during stay)  Re-estimated needs: 1118 kcal,   80-90 gm protein  Nutrition Dx:  Inadequate oral intake-ongoing  Goal:  TPN to meet 95% estimated needs.  Transition to TF as tolerated.  Monitor:  TPN with pharmacy, diet advancement, meds, labs, weight   Oran Rein, RD, LDN Clinical Inpatient Dietitian Pager:  [phone] Weekend and after hours pager:   [phone]

## 2011-12-02 NOTE — Progress Notes (Signed)
PARENTERAL NUTRITION CONSULT NOTE   Pharmacy Consult for TPN Indication: Prolonged ileus  Allergies  Allergen Reactions  . Codeine Hives  . Other Nausea And Vomiting    anesthesia  . Penicillins Hives  . Sulfa Antibiotics Hives   Patient Measurements: Height: 4\' 11"  (149.9 cm) (4.11) Weight: 139 lb 12.4 oz (63.4 kg) IBW/kg (Calculated) : 43.2  Adjusted Body Weight: 50.4  Vital Signs: Temp: 100 F (37.8 C) (09/10 0000) Temp src: Axillary (09/10 0000) BP: 110/46 mmHg (09/10 0700) Pulse Rate: 61  (09/10 0700) Intake/Output from previous day: 09/09 0701 - 09/10 0700 In: 3827.9 [I.V.:841.9; IV Piggyback:1116; TPN:1870] Out: 2395 [Urine:2395]  Labs:  [facility] 12/02/11 0500 12/01/11 0408 11/30/11 0500  WBC 27.5* 49.4* 34.8*  HGB 7.9* 8.8* 7.5*  HCT 22.6* 26.4* 22.1*  PLT 239 305 227  APTT -- -- --  INR -- -- --    Basename 12/02/11 0500 12/01/11 0408 11/30/11 0500  NA 141 141 137  K 2.9* 3.5 3.8  CL 102 102 103  CO2 34* 30 31  GLUCOSE 145* 150* 172*  BUN 27* 22 15  CREATININE 0.50 0.47* 0.46*  LABCREA -- -- --  CREAT24HRUR -- -- --  CALCIUM 7.3* 7.9* 7.7*  MG 1.7 1.6 --  PHOS 2.6 3.7 --  PROT -- 4.4* --  ALBUMIN -- 1.8* --  AST -- 20 --  ALT -- 12 --  ALKPHOS -- 148* --  BILITOT -- 0.4 --  BILIDIR -- -- --  IBILI -- -- --  PREALBUMIN -- 22.6 --  TRIG -- 76 --  CHOLHDL -- -- --  CHOL -- 101 --  Corrected Cal 9/9 = 9.06 (wnl) Estimated Creatinine Clearance: 47.7 ml/min (by C-G formula based on Cr of 0.5).    Basename 12/02/11 0437 12/01/11 2346 12/01/11 1945  GLUCAP 145* 115* 142*   CBGs & Insulin requirements past 24 hours:   CBGs = 115-145 (within goal of < 150mg /dl)  Resistant SSI Z6X: 13 units given  Regular insulin in TNA bag: 30 units/2L Clinimix  Nutritional Goals:   RD recs 11/26/11: 1468 kcals, 80-90 gm protein daily  Clinimix 5/20 goal rate of 60 ml/hr (+ Lipids MWF) to provide: 72 g/day protein and an average of 1472 Kcal/day (1746  Kcal/day MWF, 1267 Kcal/day STTHS).  Current nutrition:   Diet: NPO  [facility]F: [facility] at [facility]  Assessment:   78 yo F admit on 8/30 w/ ruptured sigmoid diverticulum, peritonitis, and pelvic abscess. S/p laparotomy 8/31.  TNA started 9/4 for prolonged ileus.   CT abd/pelvis 9/6 Cannot exclude infected ascites or developing early abscess (MD doesn't think it's impressive).  Of note, she has B-Cell Lymphoma currently receiving chemotherapy.   CBGs better since insulin increased in TNA 9/8  Renal/hepatic function:  WNL  I/O = 3836/3170 (NG=no output documented)  Electrolytes: K=2.9 (goal is >= 4 with ileus), Na and Cl improved to wnl. Mag remains at low end normal despite replacement. Corrected Ca WNL. HCO3 elevated  Pre-Albumin: 11.7 (9/4), 22.6 (9/8)  TG/Cholesterol: wnl 9/8  GI prophylaxis:  pantoprazole  Plan:  At 1800 tonight Continue Clinimix E 5/20 at goal rate of 60 ml/hr, prealbumin much improved (note patient on steroids).  TNA to contain fat emulsion, standard multivitamins and trace elements MWF only due to ongoing shortage Continue IVF at [facility]  KCl x 5 runs, further replacement per MD Magnesium 2gm IV x 1 Unable to adjust acetate/Cl- ratio in TPN Continue with 30 units insulin per 2L TNA  bag (inc from 20-30 last pm) to decrease SSI needs (Appears SSI need decreased since start of new TNA bag last pm) TNA lab panels on Mondays & Thursdays. F/u daily.  Juliette Alcide, PharmD, BCPS.   Pager: [phone] 12/02/2011 7:16 AM

## 2011-12-02 NOTE — Progress Notes (Signed)
Patient ID: [name], female   DOB: [birth year], 78 y.o.   MRN: [id number] 10 Days Post-Op  Subjective: Pt on ventilator.  Spouse present and very appreciative of all the care his wife has received.  Objective: Vital signs in last 24 hours: Temp:  [97.9 F (36.6 C)-100 F (37.8 C)] 97.9 F (36.6 C) (09/10 0800) Pulse Rate:  [55-122] 70  (09/10 1100) Resp:  [14-30] 17  (09/10 1100) BP: (72-139)/(39-87) 120/53 mmHg (09/10 1100) SpO2:  [98 %-100 %] 100 % (09/10 1100) FiO2 (%):  [30 %-60 %] 30 % (09/10 1049) Weight:  [139 lb 12.4 oz (63.4 kg)] 139 lb 12.4 oz (63.4 kg) (09/10 0600) Last BM Date:  (colostomy)  Intake/Output from previous day: 09/09 0701 - 09/10 0700 In: 3827.9 [I.V.:841.9; IV Piggyback:1116; TPN:1870] Out: 3170 [Urine:3170] Intake/Output this shift:    PE: Abd: soft, few BS, superior portion of wound with some tan cloudy drainage.  Several staples removed and area packed.  Ostomy with no significant output.  Lab Results:   Basename 12/02/11 0500 12/01/11 0408  WBC 27.5* 49.4*  HGB 7.9* 8.8*  HCT 22.6* 26.4*  PLT 239 305   BMET  Basename 12/02/11 0500 12/01/11 0408  NA 141 141  K 2.9* 3.5  CL 102 102  CO2 34* 30  GLUCOSE 145* 150*  BUN 27* 22  CREATININE 0.50 0.47*  CALCIUM 7.3* 7.9*   PT/INR No results found for this basename: LABPROT:2,INR:2 in the last 72 hours CMP     Component Value Date/Time   NA 141 12/02/2011 0500   NA 134* 11/20/2011 1055   K 2.9* 12/02/2011 0500   K 2.9* 11/20/2011 1055   CL 102 12/02/2011 0500   CL 96* 11/20/2011 1055   CO2 34* 12/02/2011 0500   CO2 26 11/20/2011 1055   GLUCOSE 145* 12/02/2011 0500   GLUCOSE 167* 11/20/2011 1055   BUN 27* 12/02/2011 0500   BUN 11.0 11/20/2011 1055   CREATININE 0.50 12/02/2011 0500   CREATININE 0.8 11/20/2011 1055   CALCIUM 7.3* 12/02/2011 0500   PROT 4.4* 12/01/2011 0408   PROT 5.4* 11/20/2011 1055   ALBUMIN 1.8* 12/01/2011 0408   ALBUMIN 2.8* 11/20/2011 1055   AST 20 12/01/2011 0408   AST  13 11/20/2011 1055   ALT 12 12/01/2011 0408   ALT 14 11/20/2011 1055   ALKPHOS 148* 12/01/2011 0408   ALKPHOS 97 11/20/2011 1055   BILITOT 0.4 12/01/2011 0408   BILITOT 0.90 11/20/2011 1055   GFRNONAA >90 12/02/2011 0500   GFRAA >90 12/02/2011 0500   Lipase     Component Value Date/Time   LIPASE 9* 11/21/2011 1909       Studies/Results: Dg Chest Port 1 View  12/02/2011  *RADIOLOGY REPORT*  Clinical Data: Endotracheal tube placed  PORTABLE CHEST - 1 VIEW  Comparison: Portable chest x-ray of 12/01/2011  Findings: The tip of the endotracheal tube is approximately 1.3 cm above the carina.  There has been some improvement patchy airspace disease although there does appear to be moderate pulmonary vascular congestion present with effusions.  Left and right central venous lines are noted.  The heart is within normal limits in size.  IMPRESSION:  1.  Tip of endotracheal tube approximately 1.3 cm above the carina. 2.  Some improvement in patchy airspace disease. 3.  Probable moderate pulmonary vascular congestion with effusions.   Original Report Authenticated By: Juline Patch, M.D.    Dg Chest Port 1 View  12/01/2011  *RADIOLOGY  REPORT*  Clinical Data: Intubated.  Check endotracheal placement.  PORTABLE CHEST - 1 VIEW  Comparison: Same day  Findings: Endotracheal tube has its tip 1.5 cm above the carina. Port-A-Cath and central line are unchanged.  Lungs are better aerated.  Persistent patchy pulmonary density persists bilaterally. Effusions and lower lobe volume loss persist.  IMPRESSION: Endotracheal tube well positioned with the tip 1.5 cm above the carina.  Improved pulmonary aeration.   Original Report Authenticated By: Thomasenia Sales, M.D.    Dg Chest Port 1 View  12/01/2011  *RADIOLOGY REPORT*  Clinical Data: Congestive heart failure.  Atelectasis.  PORTABLE CHEST - 1 VIEW  Comparison: 11/30/2011  Findings: Endotracheal tube and nasogastric tube have been removed. Right-sided power port is unchanged with  its tip in the SVC just above the right atrium.  Left subclavian central line is unchanged with its tip at the SVC/RA junction or proximal right atrium. Pulmonary edema persists and is possibly slightly worsened. Bilateral effusions persist with atelectasis.  IMPRESSION: Suspicion of worsening edema, effusions and atelectasis following extubation.   Original Report Authenticated By: Thomasenia Sales, M.D.     Anti-infectives: Anti-infectives     Start     Dose/Rate Route Frequency Ordered Stop   12/01/11 2200   vancomycin (VANCOCIN) 500 mg in sodium chloride 0.9 % 100 mL IVPB        500 mg 100 mL/hr over 60 Minutes Intravenous Every 12 hours 12/01/11 1035     12/01/11 1200   vancomycin (VANCOCIN) 1,250 mg in sodium chloride 0.9 % 250 mL IVPB        1,250 mg 166.7 mL/hr over 90 Minutes Intravenous  Once 12/01/11 1035 12/01/11 1305   11/29/11 1000   aztreonam (AZACTAM) 2 g in dextrose 5 % 50 mL IVPB        2 g 100 mL/hr over 30 Minutes Intravenous Every 8 hours 11/29/11 0907     11/28/11 1200   aztreonam (AZACTAM) 2 g in dextrose 5 % 50 mL IVPB  Status:  Discontinued        2 g 100 mL/hr over 30 Minutes Intravenous Every 12 hours 11/28/11 0240 11/28/11 0903   11/28/11 1000   fluconazole (DIFLUCAN) IVPB 200 mg        200 mg 100 mL/hr over 60 Minutes Intravenous Every 24 hours 11/28/11 0915     11/28/11 0200   micafungin (MYCAMINE) 100 mg in sodium chloride 0.9 % 100 mL IVPB  Status:  Discontinued     Comments: Pharmacy to verify the dose      100 mg 100 mL/hr over 1 Hours Intravenous Daily at bedtime 11/28/11 0133 11/28/11 0903   11/28/11 0130   aztreonam (AZACTAM) 2 g in dextrose 5 % 50 mL IVPB        2 g 100 mL/hr over 30 Minutes Intravenous  Once 11/28/11 0130 11/28/11 0230   11/23/11 0300   fluconazole (DIFLUCAN) IVPB 200 mg  Status:  Discontinued        200 mg 100 mL/hr over 60 Minutes Intravenous Every 24 hours 11/22/11 0254 11/26/11 1442   11/22/11 0300   fluconazole  (DIFLUCAN) IVPB 400 mg        400 mg 200 mL/hr over 60 Minutes Intravenous  Once 11/22/11 0254 11/22/11 0518   11/22/11 0230   metroNIDAZOLE (FLAGYL) IVPB 500 mg  Status:  Discontinued        500 mg 100 mL/hr over 60 Minutes Intravenous  Once 11/22/11  9604 11/22/11 0230   11/21/11 2230   ciprofloxacin (CIPRO) IVPB 400 mg  Status:  Discontinued        400 mg 200 mL/hr over 60 Minutes Intravenous 2 times daily 11/21/11 2207 11/29/11 0840   11/21/11 2230   metroNIDAZOLE (FLAGYL) IVPB 500 mg        500 mg 100 mL/hr over 60 Minutes Intravenous 3 times per day 11/21/11 2207     11/21/11 2100   meropenem (MERREM) 1 g in sodium chloride 0.9 % 100 mL IVPB  Status:  Discontinued        1 g 200 mL/hr over 30 Minutes Intravenous 3 times per day 11/21/11 2013 11/21/11 2146           Assessment/Plan  1. S/p ex lap with Hartman's  2. VDRF 3. Probably PNA 4. A fib, currently in NSR  Plan: 1. Staples WD dressing changes to new area of wound opened up.   2. Steroids decreased per CCM 3. Critical care per CCM, appreciate all their assistance 4. Will place an OG tube and have nutrition calculate goal rate for TFs.  We will start with TFs today. 5. Dr. Molli Knock spoke with the eldest son today about a trach and PEG, Dr. Molli Knock would like a little more time to see if he can extubate her, but if not then she will likely need a trach and PEG.   LOS: 11 days    Mustaf Antonacci E 12/02/2011

## 2011-12-02 NOTE — Progress Notes (Signed)
Name: [name] MRN: [id number] DOB: Nov 10, 1931    LOS: 11  Referring Provider:  Dr. Gerrit Friends  Reason for Referral:  Septic shock  PULMONARY / CRITICAL CARE MEDICINE  HPI:  78 yo female admitted 11/21/2011 5 days of abdominal pain.  Found to have perforated sigmoid diverticulum with peritonitis, and pelvic abscess.  Had laparotomy 8/31.  Developed respiratory failure and shock post-op.  PCCM consulted 8/31.  She has hx of B cell lymphoma currently on chemo as outpt.  Events Since Admission: 8/31 Laparotomy, sigmoid colectomy and ostomy 9/01 A fib with RVR 9/02 Off pressors 9/06 A fib RVR>>back to sinus; added precedex for agitation; hypotensive.  Rt thoracentesis (transudate). 9/9 Reintubated.  Subjective:  Sedated and intubated, no events overnight.  Vital Signs: Temp:  [97.9 F (36.6 C)-100 F (37.8 C)] 97.9 F (36.6 C) (09/10 0800) Pulse Rate:  [55-122] 55  (09/10 0800) Resp:  [16-32] 16  (09/10 0800) BP: (72-148)/(39-87) 120/59 mmHg (09/10 0800) SpO2:  [97 %-100 %] 99 % (09/10 0758) FiO2 (%):  [30 %-100 %] 30 % (09/10 0819) Weight:  [63.4 kg (139 lb 12.4 oz)] 63.4 kg (139 lb 12.4 oz) (09/10 0600)  Intake/Output Summary (Last 24 hours) at 12/02/11 0911 Last data filed at 12/02/11 0700  Gross per 24 hour  Intake 3617.94 ml  Output   3045 ml  Net 572.94 ml   Physical Examination: General: Ill appearing Neuro: Somnolent, moves extremities, no some commands HEENT: On BiPAP 5 over 5 Neck:  Supple Cardiovascular: s1s2 regular, no murmur Lungs: decreased breath sounds, slight expiratory wheeze, basilar rales Abdomen:  Wound dressings clean. Stoma red small amount of stool. Musculoskeletal:  No edema Skin:  No rashes.    ASSESSMENT AND PLAN  PULMONARY  Lab 12/02/11 0412 12/01/11 1030 11/28/11 0234 11/28/11 0221 11/27/11 2359  PHART 7.496* 7.413 -- -- 7.475*  PCO2ART 44.0 47.8* -- -- 35.9  PO2ART 92.3 230.0* -- -- 56.3*  HCO3 33.4* 29.9* -- -- 26.4*  O2SAT  97.5 99.7 43.3 38.3 89.2   Dg Chest Port 1 View  12/01/2011  *RADIOLOGY REPORT*  Clinical Data: Congestive heart failure.  Atelectasis.  PORTABLE CHEST - 1 VIEW  Comparison: 11/30/2011  Findings: Endotracheal tube and nasogastric tube have been removed. Right-sided power port is unchanged with its tip in the SVC just above the right atrium.  Left subclavian central line is unchanged with its tip at the SVC/RA junction or proximal right atrium. Pulmonary edema persists and is possibly slightly worsened. Bilateral effusions persist with atelectasis.  IMPRESSION: Suspicion of worsening edema, effusions and atelectasis following extubation.   Original Report Authenticated By: Thomasenia Sales, M.D.    Ventilator Settings: PCV RR 16, PS10, PEEP5, FiO2 of 30%.  ETT:  8/31 >> 9/8 >>> 9/9 >>>  A: Acute respiratory failure in setting of septic shock from peritonitis.  Mental status barrier to extubation. Extubated on 9/8. Patient respirations 40 per minutes on BiPAP. CXR w/ progressive bilateral airspace disease. Likely pulmonary edema and ? PNA.  P:   F/u CXR No hx of obstructive lung disease or smoking>>changed BD's to prn Change to PCV today for ventilator asyncrhony. Begin PS trials today. More diureses today. Discuss with family about trach and PEG today, they are awaiting recommendations from health care team prior to making decision. Decrease PEEP to 5.  CARDIOVASCULAR  Lab 11/28/11 1150 11/28/11 0240 11/28/11 0230 11/27/11 2350  TROPONINI <0.30 <0.30 -- 0.38*  LATICACIDVEN -- -- 2.1 --  PROBNP -- -- -- --  Lines:  [place] CVL 8/31 >>   Echo 9/01>>periapical akinesis, EF 25 to 30%, diffuse hypokinesis, grade 1 diastolic dysfx, mod AR, mild MR, mod TR, mild/mod RV systolic dysfx, PAS 37 mmHg  A:  1) Septic shock 2nd to peritonitis>>resolved. 2) A fib with RVR>>back in sinus 9/02. 3) Acute systolic CHF>>?if this is sepsis related cardiomyopathy. 4) Hypotension 9/06 more likely related to  diuresis, sedation and lopressor use rather than recurrent sepsis (she is negative 5 liters since 9/03). Once again in pulmonary edema.  P:  Keep CVP>8. Hold lopressor for now. Change solu-cortef to 25 mg q12h and continue for 3 days. May need cardiology to re-assess but stable for now.  RENAL  Lab 12/02/11 0500 12/01/11 0408 11/30/11 0500 11/29/11 0500 11/28/11 0230 11/27/11 2350 11/27/11 1745 11/27/11 0530  NA 141 141 137 135 131* -- -- --  K 2.9* 3.5 -- -- -- -- -- --  CL 102 102 103 97 94* -- -- --  CO2 34* 30 31 30 30  -- -- --  BUN 27* 22 15 11 8  -- -- --  CREATININE 0.50 0.47* 0.46* 0.41* 0.51 -- -- --  CALCIUM 7.3* 7.9* 7.7* 7.5* 6.7* -- -- --  MG 1.7 1.6 -- -- -- 1.3* 1.4* 1.4*  PHOS 2.6 3.7 -- -- -- 2.9 3.3 2.0*   Intake/Output      09/09 0701 - 09/10 0700 09/10 0701 - 09/11 0700   I.V. (mL/kg) 841.9 (13.3)    IV Piggyback 1116    TPN 1870    Total Intake(mL/kg) 3827.9 (60.4)    Urine (mL/kg/hr) 3170 (2.1)    Emesis/NG output     Stool     Total Output 3170    Net +657.9          Foley:  Placed 11/21/2011  A: Hypokalemia, hypomagnesemia P:   Replace and F/u BMET, electrolytes Monitor renal fx, urine outpt Lasix as ordered. K, Mg and Phos replacement.  GASTROINTESTINAL  Lab 12/01/11 0408 11/28/11 1715 11/27/11 2350 11/27/11 0530  AST 20 -- 13 14  ALT 12 -- 9 12  ALKPHOS 148* -- 95 120*  BILITOT 0.4 -- 0.2* 0.2*  PROT 4.4* 3.7* 3.4* 3.9*  ALBUMIN 1.8* -- 1.5* 1.7*   A:  Perforated sigmoid diverticulum s/p laparotomy>>CT abd/pelvis 9/06 not impressive for abscess, but very vague read. Surgery is following.   P:   Post-op care per CCS. TNA started 9/04 per CCS. ?when she can use her gut for meds/nutrition>>defer to CCS. Possible repeat CT scan next couple of days, defer to CCS.  HEMATOLOGIC  Lab 12/02/11 0500 12/01/11 0408 11/30/11 0500 11/29/11 0500 11/28/11 0230  HGB 7.9* 8.8* 7.5* 8.6* 8.9*  HCT 22.6* 26.4* 22.1* 25.0* 25.5*  PLT 239 305 227  197 158  INR -- -- -- -- 1.41  APTT -- -- -- -- 36   A:  1)  Follicular B cell lymphoma, currently undergoing chemotherapy. 2)  Anemia of critical illness, and chronic disease. 3)  Thrombocytopenia likely related to sepsis/critical illness>>improved. P:  F/u CBC Transfuse for Hb < 7  INFECTIOUS  Lab 12/02/11 0500 12/01/11 0408 11/30/11 0500 11/29/11 0500 11/28/11 0230  WBC 27.5* 49.4* 34.8* 36.5* 29.9*  PROCALCITON -- -- -- -- 0.29   Cultures: Blood 8/31 >>negative Urine 8/31 >>negative Sputum 9/05 >>Oral flora Rt pleural fluid 9/06>> Urine 9/06: E coli (pansens) C diff 9/06>>negative Blood 9/06>>  Antibiotics: Cipro 8/31 >>9/06 Flagyl 8/31 >> Fluconazole 8/31 >> Aztreonam 9/07>>  A:   1) Peritonitis 2nd to sigmoid perforation>>improved. 2) Increased respiratory secretions 9/05>>improved 9/07. 3) Increasing WBC, but no fever>>she is getting solu cortef.  ?of infiltrate vs ATX on CXR/CT, and GNR in urine cx 9/06. Hx of PCN allergy. P:   D10/x flagyl, diflucan. D4/x aztreonam. D2/x Vancomycin. F/u cx results and adjust abx further.  ENDOCRINE  Lab 12/02/11 0746 12/02/11 0437 12/01/11 2346 12/01/11 1945 12/01/11 1539  GLUCAP 145* 145* 115* 142* 142*   A:  Hyperglycemia. P:   SSI  NEUROLOGIC  A:  Sedation.  CT head 9/06 unremarkable. P:   Try to limit sedation while weaning vent.  Spoke with patient's son over the phone (the cardiologist), I posed the question of trach/peg, he would like to speak with his other brother (oncologist) prior to making decisions and I asked him to give me a little more time to work with patient prior to making clear recommendations.  CC time 40 min.  Alyson Reedy, M.D. [facility] Pulmonary/Critical Care Medicine. Pager: [phone]. After hours pager: [phone].

## 2011-12-02 NOTE — Plan of Care (Signed)
Problem: Phase II Progression Outcomes Goal: Progressing with IS, TCDB Outcome: Not Progressing Intubated Goal: Tolerating diet Outcome: Not Applicable Date Met:  12/02/11 Intubated.  Receiving TNA.  Trickle tube feeding via OG tube started today.

## 2011-12-03 ENCOUNTER — Inpatient Hospital Stay (HOSPITAL_COMMUNITY): Payer: Medicare Other

## 2011-12-03 LAB — URINALYSIS, MICROSCOPIC ONLY
Bilirubin Urine: NEGATIVE
Hgb urine dipstick: NEGATIVE
Specific Gravity, Urine: 1.011 (ref 1.005–1.030)
pH: 7 (ref 5.0–8.0)

## 2011-12-03 LAB — BLOOD GAS, ARTERIAL
Acid-Base Excess: 10.5 mmol/L — ABNORMAL HIGH (ref 0.0–2.0)
Drawn by: 11249
O2 Saturation: 99.6 %
PEEP: 5 cmH2O
Patient temperature: 98.6
Patient temperature: 98.6
Pressure support: 10 cmH2O
RATE: 16 resp/min
TCO2: 30.4 mmol/L (ref 0–100)
pH, Arterial: 7.614 (ref 7.350–7.450)

## 2011-12-03 LAB — BASIC METABOLIC PANEL
BUN: 27 mg/dL — ABNORMAL HIGH (ref 6–23)
CO2: 36 mEq/L — ABNORMAL HIGH (ref 19–32)
Calcium: 7.5 mg/dL — ABNORMAL LOW (ref 8.4–10.5)
Creatinine, Ser: 0.4 mg/dL — ABNORMAL LOW (ref 0.50–1.10)
GFR calc non Af Amer: >90 mL/min (ref >90)
Glucose, Bld: 163 mg/dL — ABNORMAL HIGH (ref 70–99)
Sodium: 139 mEq/L (ref 135–145)

## 2011-12-03 LAB — CBC
HCT: 22.8 % — ABNORMAL LOW (ref 36.0–46.0)
Hemoglobin: 7.7 g/dL — ABNORMAL LOW (ref 12.0–15.0)
MCH: 30.3 pg (ref 26.0–34.0)
MCV: 89.8 fL (ref 78.0–100.0)
RBC: 2.54 MIL/uL — ABNORMAL LOW (ref 3.87–5.11)

## 2011-12-03 LAB — GLUCOSE, CAPILLARY
Glucose-Capillary: 139 mg/dL — ABNORMAL HIGH (ref 70–99)
Glucose-Capillary: 122 mg/dL — ABNORMAL HIGH (ref 70–99)

## 2011-12-03 LAB — MAGNESIUM: Magnesium: 2.1 mg/dL (ref 1.5–2.5)

## 2011-12-03 LAB — PHOSPHORUS: Phosphorus: 3.6 mg/dL (ref 2.3–4.6)

## 2011-12-03 MED ORDER — MAGNESIUM SULFATE 40 MG/ML IJ SOLN
2.0000 g | Freq: Once | INTRAMUSCULAR | Status: AC
  Administered 2011-12-03: 2 g via INTRAVENOUS
  Filled 2011-12-03: qty 50

## 2011-12-03 MED ORDER — FAT EMULSION 20 % IV EMUL
240.0000 mL | INTRAVENOUS | Status: AC
  Administered 2011-12-03: 240 mL via INTRAVENOUS
  Filled 2011-12-03: qty 250

## 2011-12-03 MED ORDER — FUROSEMIDE 10 MG/ML IJ SOLN
40.0000 mg | Freq: Four times a day (QID) | INTRAMUSCULAR | Status: AC
  Administered 2011-12-03 – 2011-12-04 (×3): 40 mg via INTRAVENOUS
  Filled 2011-12-03 (×3): qty 4

## 2011-12-03 MED ORDER — ZINC TRACE METAL 1 MG/ML IV SOLN
INTRAVENOUS | Status: DC
  Filled 2011-12-03: qty 1000

## 2011-12-03 MED ORDER — POTASSIUM CHLORIDE 10 MEQ/50ML IV SOLN
10.0000 meq | INTRAVENOUS | Status: AC
  Administered 2011-12-03 (×4): 10 meq via INTRAVENOUS
  Filled 2011-12-03 (×4): qty 50

## 2011-12-03 MED ORDER — POTASSIUM CHLORIDE 20 MEQ/15ML (10%) PO LIQD
40.0000 meq | ORAL | Status: AC
  Administered 2011-12-03 (×2): 40 meq
  Filled 2011-12-03 (×2): qty 30

## 2011-12-03 MED ORDER — METOPROLOL TARTRATE 1 MG/ML IV SOLN
2.5000 mg | Freq: Four times a day (QID) | INTRAVENOUS | Status: DC
  Administered 2011-12-03 – 2011-12-04 (×4): 2.5 mg via INTRAVENOUS
  Filled 2011-12-03 (×4): qty 5

## 2011-12-03 MED ORDER — VITAL AF 1.2 CAL PO LIQD
1000.0000 mL | ORAL | Status: DC
  Administered 2011-12-03 – 2011-12-04 (×2): 1000 mL
  Filled 2011-12-03: qty 1000

## 2011-12-03 MED ORDER — QUETIAPINE FUMARATE 25 MG PO TABS
25.0000 mg | ORAL_TABLET | Freq: Two times a day (BID) | ORAL | Status: DC
  Administered 2011-12-03 – 2011-12-05 (×5): 25 mg via ORAL
  Filled 2011-12-03 (×10): qty 1

## 2011-12-03 MED ORDER — ZINC TRACE METAL 1 MG/ML IV SOLN
INTRAVENOUS | Status: AC
  Administered 2011-12-03: 18:00:00 via INTRAVENOUS
  Filled 2011-12-03: qty 1000

## 2011-12-03 NOTE — Consult Note (Signed)
WOC ostomy consult:  Initial visit.  Made aware of patient's ostomy today by nursing staff. Stoma type/location: LLQ Colostomy. Round, budded stoma, before today, not functioning.  Today, 50ml of brown effluent. Stomal assessment/size: 1 and 5/8 round stoma, pink, budded Peristomal assessment: intact Treatment options for stomal/peristomal skin: none indicated Output 50ml brown stool Ostomy pouching: 1pc. Cut-to-fit pouch Lawson#[id number].  Education provided: None at this time.  Patient is on a vent and sleeping at this time.  No family in room. WOC Nurses will follow with you. Thanks, Ladona Mow, MSN, RN, [facility], CWOCN [phone])

## 2011-12-03 NOTE — Progress Notes (Signed)
Nutrition Follow-up  Intervention:  1. Will increase Vital 1.2 from 10 ml/hr to 20 ml/hr. Tube feeding at 20 ml/hr plus TNA Clinimix E 5/15 @ 30 ml/hr to provide 1292 kcal and 72 grams of protein. (Meets > 90% of estimated energy needs). 2. RD to follow for nutrition plan of care.   Assessment:   Per MD will increase TF to 20 ml/hr today. Per pharmacy plans to wean TNA to Clinimix E 5/15 @ 30 ml/ hr plus lipids 20% 10 ml/hr MWF. Patient remains intubated with positive tolerance of TF so far.   Diet Order:  Vital 1.2 @ 10 ml/hr plus TNA Clinimix E 5/20 @ 60 ml/hr plus lipids MWF  Meds: Scheduled Meds:   . acetylcysteine  3 mL Nebulization QID  . ipratropium  0.5 mg Nebulization QID   And  . albuterol  2.5 mg Nebulization QID  . antiseptic oral rinse  15 mL Mouth Rinse QID  . aztreonam  2 g Intravenous Q8H  . chlorhexidine  15 mL Mouth Rinse BID  . enoxaparin (LOVENOX) injection  40 mg Subcutaneous Q24H  . feeding supplement (VITAL AF 1.2 CAL)  1,000 mL Per Tube Q24H  . furosemide  40 mg Intravenous Q6H  . furosemide  40 mg Intravenous Q6H  . hydrocortisone sodium succinate  25 mg Intravenous Q12H  . insulin aspart  0-20 Units Subcutaneous Q4H  . magnesium sulfate 1 - 4 g bolus IVPB  2 g Intravenous Once  . metoprolol  2.5 mg Intravenous Q6H  . metronidazole  500 mg Intravenous Q8H  . pantoprazole (PROTONIX) IV  40 mg Intravenous QHS  . potassium chloride  10 mEq Intravenous Q1 Hr x 5  . potassium chloride  10 mEq Intravenous Q1 Hr x 4  . potassium chloride  40 mEq Per Tube Q4H  . potassium phosphate IVPB (mmol)  30 mmol Intravenous Once  . QUEtiapine  25 mg Oral BID  . DISCONTD: fluconazole (DIFLUCAN) IV  200 mg Intravenous Q24H  . DISCONTD: metoprolol  5 mg Intravenous Q6H  . DISCONTD: vancomycin  500 mg Intravenous Q12H   Continuous Infusions:   . sodium chloride 20 mL/hr (12/02/11 1502)  . sodium chloride 20 mL (12/01/11 2359)  . sodium chloride    . dexmedetomidine 1.2  mcg/kg/hr (12/03/11 1229)  . fat emulsion    . TPN (CLINIMIX) +/- additives 60 mL/hr at 12/01/11 1755   And  . fat emulsion 250 mL (12/02/11 1700)  . TPN (CLINIMIX) +/- additives 60 mL/hr at 12/03/11 1200  . TPN (CLINIMIX) +/- additives    . DISCONTD: dexmedetomidine 1.2 mcg/kg/hr (12/02/11 1400)  . DISCONTD: TPN (CLINIMIX) +/- additives     PRN Meds:.albuterol, albuterol, fentaNYL, ipratropium, midazolam, ondansetron  Labs:  CMP     Component Value Date/Time   NA 139 12/03/2011 0440   NA 134* 11/20/2011 1055   K 2.6* 12/03/2011 0440   K 2.9* 11/20/2011 1055   CL 98 12/03/2011 0440   CL 96* 11/20/2011 1055   CO2 36* 12/03/2011 0440   CO2 26 11/20/2011 1055   GLUCOSE 163* 12/03/2011 0440   GLUCOSE 167* 11/20/2011 1055   BUN 31* 12/03/2011 0440   BUN 11.0 11/20/2011 1055   CREATININE 0.46* 12/03/2011 0440   CREATININE 0.8 11/20/2011 1055   CALCIUM 7.5* 12/03/2011 0440   PROT 4.4* 12/01/2011 0408   PROT 5.4* 11/20/2011 1055   ALBUMIN 1.8* 12/01/2011 0408   ALBUMIN 2.8* 11/20/2011 1055   AST 20 12/01/2011 0408  AST 13 11/20/2011 1055   ALT 12 12/01/2011 0408   ALT 14 11/20/2011 1055   ALKPHOS 148* 12/01/2011 0408   ALKPHOS 97 11/20/2011 1055   BILITOT 0.4 12/01/2011 0408   BILITOT 0.90 11/20/2011 1055   GFRNONAA >90 12/03/2011 0440   GFRAA >90 12/03/2011 0440     Intake/Output Summary (Last 24 hours) at 12/03/11 1242 Last data filed at 12/03/11 1229  Gross per 24 hour  Intake 2459.15 ml  Output   3015 ml  Net -555.85 ml   Estimated needs remain the same: 118 kcal and 80-90 grams protein   GOAL: meet > 90% of estimated energy needs with nutrition support.  Positive tolerance of TF.   Monitor: Extubation, TF tolerance, TNA per pharmacy, weights, labs   Iven Finn [facility] [phone]

## 2011-12-03 NOTE — Progress Notes (Signed)
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   PRIOR THERAPY:   #1 patient had a PET/CT performed that revealed Upper normal sized axillary and subpectoral nodes, corresponding to hypermetabolism at PET.CT of the abdomen and pelvis revealed Pelvic adenopathy, consistent with active disease. Equivocal abdominal retroperitoneal findings. Borderline adenopathy without hypermetabolism superiorly. A small more inferior retroperitoneal node demonstrates mild nonspecific hypermetabolism at PET. Scattered liver lesions, technically too small to characterize but most likely benign cysts or hemangiomas.   #2 patient began systemic chemotherapy as she is symptomatic from her lymphoma with CVP Rituxan combination given every 21 days for a total of 4-6 cycles. Started on 09/05/11   CURRENT THERAPY: s/p cycle 4 of CVP-R.    Cycle 5 of 6  was scheduled for 9/13 with dose reduced Vincristine- due to drug induced constipation,  with Neulasta on day 2.  Ms Zundel was last seen at our office on 8/29 .   Brief Summary: Ms Propps was admitted on 11/26/2011 with a 4 day history of abdominal pain and dehydration. In addition she was noted to be neutropenic. CT abdomen and pelvis with contrast on 8/31 showed free air in the abdomen consistent with perforated bowel.  Status complicated with hypotension and severe septic shock requiring intubation and broad coverage IV antibiotics.  She had emergent sigmoid colon resection with colostomy on 8/31. On 9/1 she developed A-fib with RVR requiring Cardiology involvement with initial NSR conversion, then A fib recurrence on 9/5, along with SCHF and continued hypotension. Due to worsening sepsis, an new CT of the abdomen and pelvis with contrast on 11/28/2011 revealed  Ill-defined  pelvic interloop fluid with subtle cul-de-sac peritoneal thickening or enhancement suspicious for infected ascites or  early abscess. GNR in urine were found on 9/6.She is currently on Vanco and aztreonam. On 9/6 she was also found to have R>L  pleural effusion with possible infection, requiring thoracentesis with sample sent to pathology and cytogenetics. Report consistent with reactive mesothelial cells (Dr. Dierdre Searles). Since that time, patient had slow but improved clinical status. Currently she is intubated, with light sedation. Arousable.      PMH:  Past Medical History  Diagnosis Date  . Cancer 07/08/11    follicular B cell lymphoma  . TIA (transient ischemic attack)   . Arthritis   . Hypertension   . Esophageal abnormality     strictures  . Arthritis 09/04/2011  . Dehydration 11/20/2011  . Diarrhea 11/20/2011  . Complication of anesthesia   . PONV (postoperative nausea and vomiting)     in the 1980's  . GERD (gastroesophageal reflux disease)   . Esophageal stricture     x 2    Surgeries:  Past Surgical History  Procedure Date  . Appendectomy   . Parotidectomy w/ neck dissection total   . Tonsillectomy   . Abdominal hysterectomy     left ovaries  . Laparotomy 11/22/2011    Procedure: EXPLORATORY LAPAROTOMY;  Surgeon: Velora Heckler, MD;  Location: [facility];  Service: General;  Laterality: N/A;  drainage of pelvic abscess  . Colostomy revision 11/22/2011    Procedure: COLON RESECTION SIGMOID;  Surgeon: Velora Heckler, MD;  Location: [facility];  Service: General;  Laterality: N/A;  . Colostomy 11/22/2011    Procedure: COLOSTOMY;  Surgeon: Velora Heckler, MD;  Location: [facility];  Service: General;  Laterality: N/A;  desending colostomy    Allergies:  Allergies  Allergen Reactions  . Codeine Hives  . Other Nausea And Vomiting    anesthesia  . Penicillins Hives  . Sulfa Antibiotics Hives    Medications:     . ipratropium  0.5 mg Nebulization QID   And  . albuterol  2.5 mg  Nebulization QID  . antiseptic oral rinse  15 mL Mouth Rinse QID  . aztreonam  2 g Intravenous Q8H  . chlorhexidine  15 mL Mouth Rinse BID  . enoxaparin (LOVENOX) injection  40 mg Subcutaneous Q24H  . feeding supplement (VITAL AF 1.2 CAL)  1,000 mL Per Tube Q24H  . furosemide  40 mg Intravenous Q6H  . hydrocortisone sodium succinate  25 mg Intravenous Q12H  . insulin aspart  0-20 Units Subcutaneous Q4H  . magnesium sulfate 1 - 4 g bolus IVPB  2 g Intravenous Once  . magnesium sulfate 1 - 4 g bolus IVPB  2 g Intravenous Once  . metoprolol  2.5 mg Intravenous Q6H  . metronidazole  500 mg Intravenous Q8H  . pantoprazole (PROTONIX) IV  40 mg Intravenous QHS  . potassium chloride  10 mEq Intravenous Q1 Hr x 4  . potassium chloride  10 mEq Intravenous Q1 Hr x 4  . potassium chloride  40 mEq Per Tube Q4H  . potassium chloride  40 mEq Per Tube Once  . QUEtiapine  25 mg Oral BID  . sodium chloride  1,000 mL Intravenous Once  . DISCONTD: feeding supplement (VITAL AF 1.2 CAL)  1,000 mL Per Tube Q24H  . DISCONTD: fluconazole (DIFLUCAN) IV  200 mg Intravenous Q24H  . DISCONTD: magnesium sulfate LVP 250-500 ml  2 g Intravenous Once  . DISCONTD: magnesium sulfate LVP 250-500 ml  2 g Intravenous Once  . DISCONTD: metoprolol  5 mg Intravenous Q6H  . DISCONTD: vancomycin  500 mg Intravenous Q12H     WGN:FAOZHYQMV, albuterol, fentaNYL, ipratropium, midazolam, ondansetron  ROS: Unable to obtain, patient is intubated and sedated.   Family History:    Family History  Problem Relation Age of Onset  . Cancer Mother 67    gastric  . Hypertension Father     heart disease  . Cancer Maternal Aunt 52    breast cancer  . Cancer Maternal Uncle 47    colon      Social History:  reports that she has never smoked. She has never used smokeless tobacco. She reports that she does not drink alcohol or use illicit drugs.  Physical Exam    Filed Vitals:   12/04/11 0600  BP: 148/50  Pulse: 50  Temp:    Resp: 12      General:  39 -year-old  Female, NAD, intubated, lightly sedated, arousable to sound and touch HEENT: Normocephalic, atraumatic, PERRLA. Orogastric tube present.  Neck supple. no thyromegaly, no cervical or supraclavicular adenopathy  Lungs decreased breath sounds at the bases, R>L. [name] No wheezing, rhonchi or rales. No axillary masses. Breasts: not examined. Cardiac regular rate and rhythm normal S1-S2, no murmur , rubs or gallops Abdomen: Ostomy present, as per WOC.  Soft, no apparent tenderness. Bowel sounds present.  GU/rectal: deferred. Urine catheter to gravity, clear.  Extremities no clubbing cyanosis .  1-2+ Pitting edema noted. No petechial rash  Labs:  CBC   Lab 12/04/11 0510 12/03/11 0440 12/02/11 0500 12/01/11 0408 11/30/11 0500  WBC 14.8* 18.6* 27.5* 49.4* 34.8*  HGB 7.9* 7.7* 7.9* 8.8* 7.5*  HCT 23.5* 22.8* 22.6* 26.4* 22.1*  PLT 302 258 239 305 227  MCV 90.7 89.8 91.1 91.0 89.5  MCH 30.5 30.3 31.9 30.3 30.4  MCHC 33.6 33.8 35.0 33.3 33.9  RDW 17.8* 16.9* 17.3* 16.6* 16.2*  LYMPHSABS -- -- -- 1.5 --  MONOABS -- -- -- 1.5* --  EOSABS -- -- -- 0.0 --  BASOSABS -- -- -- 0.0 --  BANDABS -- -- -- -- --     CMP    Lab 12/04/11 0510 12/03/11 1700 12/03/11 0440 12/02/11 0500 12/01/11 0408 11/27/11 2350  NA 139 137 139 141 141 --  K 2.8* 3.9 2.6* 2.9* 3.5 --  CL 98 101 98 102 102 --  CO2 37* 35* 36* 34* 30 --  GLUCOSE 112* 138* 163* 145* 150* --  BUN 28* 27* 31* 27* 22 --  CREATININE 0.42* 0.40* 0.46* 0.50 0.47* --  CALCIUM 7.4* 7.3* 7.5* 7.3* 7.9* --  MG 1.8 2.1 1.9 1.7 1.6 --  AST 15 -- -- -- 20 13  ALT 10 -- -- -- 12 9  ALKPHOS 133* -- -- -- 148* 95  BILITOT 0.3 -- -- -- 0.4 0.2*        Component Value Date/Time   BILITOT 0.3 12/04/2011 0510   BILITOT 0.90 11/20/2011 1055   BILIDIR <0.1 11/23/2011 0415   IBILI NOT CALCULATED 11/23/2011 0415      Lab 11/28/11 0230  INR 1.41  PROTIME --    No results found for this basename: DDIMER:2 in  the last 72 hours   Anemia panel:  No results found for this basename: VITAMINB12:2,FOLATE:2,FERRITIN:2,TIBC:2,IRON:2,RETICCTPCT:2 in the last 72 hours   Imaging Studies:  Ct Abdomen Pelvis W Contrast  11/28/2011 Comparison: 11/21/2011.  Findings: Degradation secondary patient arm position and minimal motion.  Bibasilar collapse / consolidative change.  Patchy right middle lobe airspace disease.  Mild cardiomegaly.  Moderate right and small to moderate left-sided pleural effusions.  Hepatic dome hypoattenuating lesions are similar to 11/11/2011 and were most consistent with cysts on that exam.  There is also subcapsular cyst in the right lobe of the liver more inferiorly.  Normal spleen.  The stomach is contrast filled, with the nasogastric terminating at gastric antrum.  Suspect a large descending duodenal diverticulum, present on prior exams.  Normal pancreas.  Gallbladder fundal stones versus mild focal wall thickening on image 46. Concurrent dependent stone.  The gallbladder measures 9.0 cm maximally without surrounding inflammation.  No gross biliary ductal dilatation.  Normal adrenal glands and kidneys.  No retroperitoneal or retrocrural adenopathy.  Rectal stump/short Hartmann's pouch creation.  Descending colostomy in the left lower quadrant.  Normal caliber of the remainder of the colon.  Normal caliber of the small bowel loops. No pneumatosis or free intraperitoneal air.  Small volume ascites within the left side of the abdomen on image 62 and extending into the left  pelvis.  No abdominal fluid collection/abscess.  Pelvic anasarca.  Air and a Foley catheter within urinary bladder.  Hysterectomy. Probable residual left ovarian tissue on image 75.  There is interloop fluid which measures 2.2 x 3.2 cm in the small bowel  mesentery of the pelvis on image 68.  This is contiguous with a left paracentral pelvic fluid collection which measures 3.1 x 4.1 cm on image 74.  There is subtle contiguous  peritoneal thickening or enhancement in the left side of the cul-de-sac on images 78 - 80.  Midline pelvic laparotomy changes. No acute osseous abnormality.  IMPRESSION: Surgical changes of descending colostomy and short Hartmann's pouch/rectal stump.  Ill-defined pelvic interloop fluid with subtle cul-de-sac peritoneal thickening or enhancement.  Cannot exclude infected ascites or developing early abscess. 2.  Gallstones with borderline gallbladder distention.  No specific evidence of acute cholecystitis.  Correlate with right upper quadrant symptoms and consider abdominal ultrasound. 3.  Right greater left pleural effusions with adjacent atelectasis or infection.  Concurrent right middle lobe infection. 4.  Multifactorial degradation.   Original Report Authenticated By: Consuello Bossier, M.D.      Ct Abdomen Pelvis Wo Contrast  11/21/2011   Comparison: 11/11/2011  Findings: Dependent atelectasis bilateral lung bases. Moderate sized hiatal hernia with retained contrast within both the hiatal hernia and within a distended distal esophagus, question related to gastroesophageal reflux. Within limits of a nonenhanced exam, no focal abnormalities of the liver, spleen, pancreas, kidneys, or adrenal glands.  Free intraperitoneal air compatible with perforated viscus new since prior exam. Numerous diverticula are identified at the descending and sigmoid colon. Extensive inflammatory process identified in the left pelvis including an extraluminal gas and fluid collections in the sigmoid mesocolon compatible with abscess. This measures up to 6.2 x 2.8 cm image 61 and extending 4.2 cm length. Significant wall thickening of the sigmoid colon, rectum, and numerous small bowel loops in the pelvis with associated free pelvic fluid.  Secondary dilatation of proximal small bowel loops. Stranding throughout the mesentery, sigmoid mesocolon and retroperitoneum including a portion of the presacral space. Appendix surgically absent  by history with nonvisualization of the uterus as well. Largest residual right inguinal lymph node measures 18 x 9 mm image 75. Diffuse osseous demineralization with degenerative disc disease changes and retrolisthesis at L2-L3.  IMPRESSION: Free intraperitoneal air and fluid with extensive inflammatory process in pelvis. Wall thickening of the sigmoid colon with numerous sigmoid diverticula and presence of a 6.2 x 2.8 x 4.2 cm diameter abscess within the sigmoid mesocolon. This constellation of findings likely represents perforated diverticulitis. Extensive associated inflammatory process of the pelvis with free fluid, wall thickening of the distal sigmoid colon, rectum, and pelvic small bowel loops, and edema throughout the mesentery and retroperitoneum. Secondary proximal small bowel dilatation.  Critical Value/emergent results were called by telephone at the time of interpretation on 11/21/2011 at 2021 hours to Dr. Jeraldine Loots, who verbally acknowledged these results.   Original Report Authenticated By: Lollie Marrow, M.D.    Ct Head Wo Contrast  11/28/2011  Comparison: None.  Findings: No acute intracranial hemorrhage.  No focal mass lesion. No CT evidence of acute infarction.   No midline shift or mass effect.  No hydrocephalus.  Basilar cisterns are patent.  Mild periventricular subcortical white matter hypodensities. Paranasal sinuses and mastoid air cells are clear.  Orbits are normal.  IMPRESSION: No acute intracranial findings.  Mild microvascular disease.   Original Report Authenticated By: Genevive Bi, M.D.    Ct Chest W Contrast  11/11/2011  Comparison:  07/29/2011  CT CHEST  Findings:  Previously seen shotty bilateral axillary and subpectoral lymphadenopathy has resolved since prior exam.  No evidence of mediastinal or hilar lymphadenopathy.  No evidence of chest wall  mass.  No evidence of pleural or pericardial effusion.  No suspicious pulmonary nodules or masses are identified.  No evidence of  pulmonary infiltrate or central endobronchial obstruction.  No suspicious bone lesions identified.  IMPRESSION: Resolution of shotty axillary and subpectoral lymphadenopathy since previous exam.  No residual disease or other acute findings identified within the thorax.  CT ABDOMEN AND PELVIS  Findings:  Tiny sub-centimeter hepatic cysts remain stable.  No liver masses are identified.  Gallbladder is unremarkable.  The pancreas, spleen, adrenal glands, and kidneys are normal appearance.  Prior hysterectomy noted.  Adnexa are unremarkable.  No evidence of abdominal or retroperitoneal lymphadenopathy. Decreased adenopathy is seen in the right inguinal region, now measuring 1.1 x 1.7 cm compared with 2.4 x 3.4 cm on previous study.  Left inguinal lymphadenopathy is resolved.  No other areas of lymphadenopathy identified within the pelvis.  Diverticulosis is again seen involving the sigmoid colon.  No evidence of diverticulitis or other inflammatory process.  No evidence of abscess or other abnormal fluid collections.  No suspicious bone lesions identified.  IMPRESSION:  1.  Mild residual right inguinal lymphadenopathy, significantly decreased since previous study. 2.  No new or progressive disease within the pelvis.  3.  Sigmoid diverticulosis.  No radiographic evidence of diverticulitis.   Original Report Authenticated By: Danae Orleans, M.D.     Ct Abdomen Pelvis W Contrast  11/11/2011  *RADIOLOGY REPORT*  Clinical Data:  Follow-up follicular B-cell lymphoma.  Ongoing chemotherapy.  Restaging.  CT CHEST, ABDOMEN AND PELVIS WITH CONTRAST  Technique:  Multidetector CT imaging of the chest, abdomen and pelvis was performed following the standard protocol during bolus administration of intravenous contrast.  Contrast: OMNIPAQUE IOHEXOL 300 MG/ML  SOLN  Comparison:  07/29/2011  CT CHEST  Findings:  Previously seen shotty bilateral axillary and subpectoral lymphadenopathy has resolved since prior exam.  No  evidence of mediastinal or hilar lymphadenopathy.  No evidence of chest wall mass.  No evidence of pleural or pericardial effusion.  No suspicious pulmonary nodules or masses are identified.  No evidence of pulmonary infiltrate or central endobronchial obstruction.  No suspicious bone lesions identified.  IMPRESSION: Resolution of shotty axillary and subpectoral lymphadenopathy since previous exam.  No residual disease or other acute findings identified within the thorax.  CT ABDOMEN AND PELVIS  Findings:  Tiny sub-centimeter hepatic cysts remain stable.  No liver masses are identified.  Gallbladder is unremarkable.  The pancreas, spleen, adrenal glands, and kidneys are normal appearance.  Prior hysterectomy noted.  Adnexa are unremarkable.  No evidence of abdominal or retroperitoneal lymphadenopathy. Decreased adenopathy is seen in the right inguinal region, now measuring 1.1 x 1.7 cm compared with 2.4 x 3.4 cm on previous study.  Left inguinal lymphadenopathy is resolved.  No other areas of lymphadenopathy identified within the pelvis.  Diverticulosis is again seen involving the sigmoid colon.  No evidence of diverticulitis or other inflammatory process.  No evidence of abscess or other abnormal fluid collections.  No suspicious bone lesions identified.  IMPRESSION:  1.  Mild residual right inguinal lymphadenopathy, significantly decreased since previous study. 2.  No new or progressive disease within the pelvis.  3.  Sigmoid diverticulosis.  No radiographic evidence of diverticulitis.   Original Report Authenticated By: Danae Orleans, M.D.    Nm Pet Image Restag (ps) Skull Base To Thigh  11/11/2011   Comparison:  07/29/2011  Findings:  Neck: No hypermetabolic lymph nodes in the neck.  Chest:  No hypermetabolic  mediastinal or hilar nodes.  No suspicious pulmonary nodules on the CT scan.  Abdomen/Pelvis:  No abnormal hypermetabolic activity within the liver, pancreas, adrenal glands, or spleen.  No  hypermetabolic lymph nodes in the abdomen.  A single residual hypermetabolic lymph node is seen in the right inguinal region which is significantly decreased in size, now measuring 11 mm in short axis on image 209 compared to 2.3 cm on previous study.  This now has a maximum SUV of 3.2 compared to 16.6 on previous study.  No other hypermetabolic lymph nodes seen within the pelvis.  Skeleton:  No focal hypermetabolic activity to suggest skeletal metastasis.  Diffuse increased hypermetabolic activity throughout the skeleton is consistent with treatment response.  IMPRESSION: Near complete metabolic response to therapy, with only a single small right inguinal lymph node with persistent low grade hypermetabolic activity.   Original Report Authenticated By: Danae Orleans, M.D.    Dg Chest Port 1 View  12/03/2011  *RADIOLOGY REPORT*  Clinical Data: Evaluate endotracheal tube placement.  PORTABLE CHEST - 1 VIEW  Comparison: Chest x-ray 12/02/2011.  Findings: An endotracheal tube is in place with tip 1.4 cm above the carina. There is a left-sided subclavian central venous catheter with tip terminating in the superior cavoatrial junction. A nasogastric tube is seen extending into the stomach, however, the tip of the nasogastric tube extends below the lower margin of the image.  The right IJ single lumen power Port-A-Cath with tip terminating in the distal superior vena cava.  Extensive bibasilar opacities persist, compatible with atelectasis and/or consolidation with superimposed small - moderate bilateral pleural effusions (left greater than right).  Pulmonary venous engorgement, without frank pulmonary edema.  Cardiac silhouette is largely obscured. The patient is rotated to the left on today's exam, resulting in distortion of the mediastinal contours and reduced diagnostic sensitivity and specificity for mediastinal pathology. Atherosclerosis of the thoracic aorta.  IMPRESSION: 1.  Support apparatus, as above. 2.   Slightly decreased lung volumes with persistent bibasilar atelectasis and/or consolidation and superimposed small - moderate bilateral pleural effusions (left greater than right). 3.  Pulmonary venous congestion without frank pulmonary edema.   Original Report Authenticated By: Florencia Reasons, M.D.       A/P:   #1. Intra-abdominal sepsis from perforated sigmoid diverticulum complicated and likely related to profound neutropenia from recent chemotherapy.  PO day #12 emergency exploration/bowel resection/ colostomy . Appreciate CCM and CCS involvement.           #2. Low grade, B cell, NHL S/P 4th cycle R-CVP chemo. Neulasta given           #3. Post op atrial arrhythmias, appreciate Cardiology involvement          #4 Leukocytosis in the setting of acute illness, infection, inflammation, meds (steroids), recent Neulasta, slowly returning to baseline.           #5. Thrombocytopenia , multifactorial due to s/ chemo, sepsis, dilution, polypharmacy, resolved.           #6 Anemia, s/p transfusion of PRBCs, multifactorial, continue supportive transfusions          #7 FEN: on TPN. Tube feeding at 20 ml/hr plus TNA Clinimix E 5/15 @ 30 ml/hr to provide 1292 kcal and 72 grams of protein. Appreciate Nutrition f/u   [facility] 12/04/2011 8:15 AM    ATTENDING'S ATTESTATION:  I personally reviewed patient's chart, examined patient myself, formulated the treatment plan as above.   Patient has received 4 cycles  of chemotherapy and overall had tolerated the treatments well. At this time no further treatment planned until patient is stable and hopefully out of the hospital.   Thank you for all the CCM are doing for this lovely lady and her family.  Drue Second, MD Medical/Oncology [facility] [phone] (beeper) [phone] (Office)  12/04/2011, 6:36 PM

## 2011-12-03 NOTE — Progress Notes (Signed)
11 Days Post-Op  Subjective: Intubated, sedated  Objective: Vital signs in last 24 hours: Temp:  [98 F (36.7 C)-98.8 F (37.1 C)] 98.3 F (36.8 C) (09/11 0400) Pulse Rate:  [50-95] 50  (09/11 0700) Resp:  [12-29] 13  (09/11 0700) BP: (108-152)/(35-64) 132/44 mmHg (09/11 0700) SpO2:  [97 %-100 %] 100 % (09/11 0700) FiO2 (%):  [30 %] 30 % (09/11 0737) Weight:  [139 lb 12.4 oz (63.4 kg)] 139 lb 12.4 oz (63.4 kg) (09/11 0000) Last BM Date:  (colostomy)  Intake/Output from previous day: 09/10 0701 - 09/11 0700 In: 2903.8 [I.V.:491.8; NG/GT:130; IV Piggyback:1532; TPN:750] Out: 3375 [Urine:3375] Intake/Output this shift: Total I/O In: -  Out: 150 [Urine:150]  General appearance: no distress GI: bs present, some stool in bag, stoma viable, wound clean  Lab Results:   Basename 12/03/11 0440 12/02/11 0500  WBC 18.6* 27.5*  HGB 7.7* 7.9*  HCT 22.8* 22.6*  PLT 258 239   BMET  Basename 12/03/11 0440 12/02/11 0500  NA 139 141  K 2.6* 2.9*  CL 98 102  CO2 36* 34*  GLUCOSE 163* 145*  BUN 31* 27*  CREATININE 0.46* 0.50  CALCIUM 7.5* 7.3*   PT/INR No results found for this basename: LABPROT:2,INR:2 in the last 72 hours ABG  Basename 12/03/11 0630 12/03/11 0454  PHART 7.581* 7.614*  HCO3 32.8* 32.9*    Studies/Results: Dg Chest Port 1 View  12/03/2011  *RADIOLOGY REPORT*  Clinical Data: Evaluate endotracheal tube placement.  PORTABLE CHEST - 1 VIEW  Comparison: Chest x-ray 12/02/2011.  Findings: An endotracheal tube is in place with tip 1.4 cm above the carina. There is a left-sided subclavian central venous catheter with tip terminating in the superior cavoatrial junction. A nasogastric tube is seen extending into the stomach, however, the tip of the nasogastric tube extends below the lower margin of the image.  The right IJ single lumen power Port-A-Cath with tip terminating in the distal superior vena cava.  Extensive bibasilar opacities persist, compatible with  atelectasis and/or consolidation with superimposed small - moderate bilateral pleural effusions (left greater than right).  Pulmonary venous engorgement, without frank pulmonary edema.  Cardiac silhouette is largely obscured. The patient is rotated to the left on today's exam, resulting in distortion of the mediastinal contours and reduced diagnostic sensitivity and specificity for mediastinal pathology. Atherosclerosis of the thoracic aorta.  IMPRESSION: 1.  Support apparatus, as above. 2.  Slightly decreased lung volumes with persistent bibasilar atelectasis and/or consolidation and superimposed small - moderate bilateral pleural effusions (left greater than right). 3.  Pulmonary venous congestion without frank pulmonary edema.   Original Report Authenticated By: Florencia Reasons, M.D.    Dg Chest Port 1 View  12/02/2011  *RADIOLOGY REPORT*  Clinical Data: Endotracheal tube placed  PORTABLE CHEST - 1 VIEW  Comparison: Portable chest x-ray of 12/01/2011  Findings: The tip of the endotracheal tube is approximately 1.3 cm above the carina.  There has been some improvement patchy airspace disease although there does appear to be moderate pulmonary vascular congestion present with effusions.  Left and right central venous lines are noted.  The heart is within normal limits in size.  IMPRESSION:  1.  Tip of endotracheal tube approximately 1.3 cm above the carina. 2.  Some improvement in patchy airspace disease. 3.  Probable moderate pulmonary vascular congestion with effusions.   Original Report Authenticated By: Juline Patch, M.D.    Dg Chest Port 1 View  12/01/2011  *RADIOLOGY REPORT*  Clinical Data: Intubated.  Check endotracheal placement.  PORTABLE CHEST - 1 VIEW  Comparison: Same day  Findings: Endotracheal tube has its tip 1.5 cm above the carina. Port-A-Cath and central line are unchanged.  Lungs are better aerated.  Persistent patchy pulmonary density persists bilaterally. Effusions and lower lobe  volume loss persist.  IMPRESSION: Endotracheal tube well positioned with the tip 1.5 cm above the carina.  Improved pulmonary aeration.   Original Report Authenticated By: Thomasenia Sales, M.D.     Anti-infectives: Anti-infectives     Start     Dose/Rate Route Frequency Ordered Stop   12/01/11 2200   vancomycin (VANCOCIN) 500 mg in sodium chloride 0.9 % 100 mL IVPB        500 mg 100 mL/hr over 60 Minutes Intravenous Every 12 hours 12/01/11 1035     12/01/11 1200   vancomycin (VANCOCIN) 1,250 mg in sodium chloride 0.9 % 250 mL IVPB        1,250 mg 166.7 mL/hr over 90 Minutes Intravenous  Once 12/01/11 1035 12/01/11 1305   11/29/11 1000   aztreonam (AZACTAM) 2 g in dextrose 5 % 50 mL IVPB        2 g 100 mL/hr over 30 Minutes Intravenous Every 8 hours 11/29/11 0907     11/28/11 1200   aztreonam (AZACTAM) 2 g in dextrose 5 % 50 mL IVPB  Status:  Discontinued        2 g 100 mL/hr over 30 Minutes Intravenous Every 12 hours 11/28/11 0240 11/28/11 0903   11/28/11 1000   fluconazole (DIFLUCAN) IVPB 200 mg        200 mg 100 mL/hr over 60 Minutes Intravenous Every 24 hours 11/28/11 0915     11/28/11 0200   micafungin (MYCAMINE) 100 mg in sodium chloride 0.9 % 100 mL IVPB  Status:  Discontinued     Comments: Pharmacy to verify the dose      100 mg 100 mL/hr over 1 Hours Intravenous Daily at bedtime 11/28/11 0133 11/28/11 0903   11/28/11 0130   aztreonam (AZACTAM) 2 g in dextrose 5 % 50 mL IVPB        2 g 100 mL/hr over 30 Minutes Intravenous  Once 11/28/11 0130 11/28/11 0230   11/23/11 0300   fluconazole (DIFLUCAN) IVPB 200 mg  Status:  Discontinued        200 mg 100 mL/hr over 60 Minutes Intravenous Every 24 hours 11/22/11 0254 11/26/11 1442   11/22/11 0300   fluconazole (DIFLUCAN) IVPB 400 mg        400 mg 200 mL/hr over 60 Minutes Intravenous  Once 11/22/11 0254 11/22/11 0518   11/22/11 0230   metroNIDAZOLE (FLAGYL) IVPB 500 mg  Status:  Discontinued        500 mg 100 mL/hr over  60 Minutes Intravenous  Once 11/22/11 0229 11/22/11 0230   11/21/11 2230   ciprofloxacin (CIPRO) IVPB 400 mg  Status:  Discontinued        400 mg 200 mL/hr over 60 Minutes Intravenous 2 times daily 11/21/11 2207 11/29/11 0840   11/21/11 2230   metroNIDAZOLE (FLAGYL) IVPB 500 mg        500 mg 100 mL/hr over 60 Minutes Intravenous 3 times per day 11/21/11 2207     11/21/11 2100   meropenem (MERREM) 1 g in sodium chloride 0.9 % 100 mL IVPB  Status:  Discontinued        1 g 200 mL/hr over 30 Minutes  Intravenous 3 times per day 11/21/11 2013 11/21/11 2146          Assessment/Plan: 1. S/p ex lap with Hartman's  2. VDRF  3. PNA  4. A fib, currently in NSR  Plan:   1. Critical care per CCM, appreciate all their assistance  2. Can increase tube feeds to 20 cc today and will hold there for now 3. Cont wound care 4. Will await possible extubation vs trach    LOS: 12 days    [facility] 12/03/2011

## 2011-12-03 NOTE — Progress Notes (Signed)
CRITICAL VALUE ALERT  Critical value received:  0539   Date of notification:  12/03/11  Time of notification:  0539  Critical value read back: yes  Nurse who received alert:  Barkley Bruns RN CCRN  MD notified (1st page):  Dr. Marchelle Gearing  Time of first page:  0540  MD notified (2nd page):N/A  Time of second page:N/A Responding MD:  N/A  Time MD responded:  N/A

## 2011-12-03 NOTE — Progress Notes (Signed)
PARENTERAL NUTRITION CONSULT NOTE   Pharmacy Consult for TPN Indication: Prolonged ileus  Allergies  Allergen Reactions  . Codeine Hives  . Other Nausea And Vomiting    anesthesia  . Penicillins Hives  . Sulfa Antibiotics Hives   Patient Measurements: Height: 4\' 11"  (149.9 cm) (4.11) Weight: 139 lb 12.4 oz (63.4 kg) IBW/kg (Calculated) : 43.2  Adjusted Body Weight: 50.4  Vital Signs: Temp: 97.7 F (36.5 C) (09/11 0800) Temp src: Axillary (09/11 0800) BP: 120/56 mmHg (09/11 1100) Pulse Rate: 86  (09/11 1100) Intake/Output from previous day: 09/10 0701 - 09/11 0700 In: 2903.8 [I.V.:491.8; NG/GT:130; IV Piggyback:1532; TPN:750] Out: 3375 [Urine:3375]  Labs:  [facility] 12/03/11 0440 12/02/11 0500 12/01/11 0408  WBC 18.6* 27.5* 49.4*  HGB 7.7* 7.9* 8.8*  HCT 22.8* 22.6* 26.4*  PLT 258 239 305  APTT -- -- --  INR -- -- --    Basename 12/03/11 0440 12/02/11 0500 12/01/11 0408  NA 139 141 141  K 2.6* 2.9* 3.5  CL 98 102 102  CO2 36* 34* 30  GLUCOSE 163* 145* 150*  BUN 31* 27* 22  CREATININE 0.46* 0.50 0.47*  LABCREA -- -- --  CREAT24HRUR -- -- --  CALCIUM 7.5* 7.3* 7.9*  MG 1.9 1.7 1.6  PHOS 3.6 2.6 3.7  PROT -- -- 4.4*  ALBUMIN -- -- 1.8*  AST -- -- 20  ALT -- -- 12  ALKPHOS -- -- 148*  BILITOT -- -- 0.4  BILIDIR -- -- --  IBILI -- -- --  PREALBUMIN -- -- 22.6  TRIG -- -- 76  CHOLHDL -- -- --  CHOL -- -- 101  Corrected Cal 9/9 = 9.06 (wnl) Estimated Creatinine Clearance: 47.7 ml/min (by C-G formula based on Cr of 0.46).    Basename 12/03/11 0817 12/03/11 0349 12/03/11 0012  GLUCAP 166* 147* 166*   CBGs & Insulin requirements past 24 hours:   CBGs = 147-166   Resistant SSI Q4h: 23 units given  Regular insulin in TNA bag: 30 units/2L Clinimix  Nutritional Goals:   RD recs 12/02/11: re-estimated needs 1118 kcal, 80-90 gm protein  Current nutrition:   Diet: NPO  Tube feeds: Vital AF 1.2 to be increased to 20 ml/hr today  Clinimix 5/20 goal  rate of 60 ml/hr (+ Lipids MWF) to provide: 72 g/day protein and an average of 1472 Kcal/day (1746 Kcal/day MWF, 1267 Kcal/day STTHS).  IVF: NS at  [facility]  Assessment:   78 yo F admit on 8/30 w/ ruptured sigmoid diverticulum, peritonitis, and pelvic abscess. S/p laparotomy 8/31.  TNA started 9/4 for prolonged ileus. CT abd/pelvis 9/6 Cannot exclude infected ascites or developing early abscess (MD doesn't think it's impressive).  Of note, she has B-Cell Lymphoma currently receiving chemotherapy.   Surgery recommending to increase tube feeds to 20 ml/hr today.  Dietitian in agreement and will place order.  Vital AF 1.2 at 20 ml/hr will provide 36g protein and 576 kcal.  With extra nutrition provided via TFs, will reduce TNA rate to avoid overfeeding.    CBGs: some slightly above goal (goal <150).  Was providing insulin 15 units/L TNA and will continue this for now since reducing TNA rate today as we advance tube feeds.  Renal/hepatic function:  WNL  Electrolytes: K=2.6 (per protocol, pharmacy unable to adjust when K+<2.8 so will defer management to MD - pt currently receiving replacement). Corrected Ca WNL. HCO3 elevated. Other lytes ok.  Pre-Albumin: 11.7 (9/4), 22.6 (9/9)  TG/Cholesterol: wnl 9/8  GI prophylaxis:  pantoprazole  Plan:  At 1800 tonight Change TNA to Clinimix E 5/15 at 30 ml/hr since tube feeds are being advanced. Provide 15 units/L of insulin in TNA.  Continue current resistant SSI. TNA to contain fat emulsion, standard multivitamins and trace elements MWF only due to ongoing shortage Continue IVF at [facility] with some boluses per MD K+ replacement per MD TNA lab panels on Mondays & Thursdays. F/u daily.  [name], PharmD, BCPS Pager: [phone] 12/03/2011 11:59 AM

## 2011-12-03 NOTE — Progress Notes (Signed)
eLink Physician-Brief Progress Note Patient Name: LATARRA MAHE DOB: 07/09/1931 MRN: [id number]  Date of Service  12/03/2011   HPI/Events of Note   abg with pH 7.6 and resp alk. RR 16 on PCV mode. No vent dysnchrony  eICU Interventions  Reduce RR to 12 Recheck abg in 1h   Intervention Category Intermediate Interventions: Respiratory distress - evaluation and management  Tyrese Ficek 12/03/2011, 5:10 AM

## 2011-12-03 NOTE — Progress Notes (Signed)
Physician and RN notified of panic PH of 7.61 and bicarb of 32.9. Physician ordered a change of rate from 16 to a rate of 12 and ordered another blood gas for 7 am. Will continue to monitor.

## 2011-12-03 NOTE — Progress Notes (Signed)
Emptied 50 ml brown, liquid stool from colostomy.

## 2011-12-03 NOTE — Progress Notes (Addendum)
Name: [name] MRN: [id number] DOB: 03-07-1932    LOS: 12  Referring Provider:  Dr. Gerrit Friends  Reason for Referral:  Septic shock  PULMONARY / CRITICAL CARE MEDICINE  HPI:  78 yo female admitted 11/21/2011 following several days of abdominal pain.  Found to have perforated sigmoid diverticulum with peritonitis, and pelvic abscess.  Had laparotomy 8/31.  Developed respiratory failure and shock post-op.  PCCM consulted 8/31.  She has hx of B cell lymphoma currently on chemo as outpt.  Events Since Admission: 8/31 Laparotomy, sigmoid colectomy and ostomy 9/01 A fib with RVR 9/02 Off pressors 9/06 A fib RVR>>back to sinus; added precedex for agitation; hypotensive.  Rt thoracentesis (transudate). 9/9 Reintubated.  Subjective:  Sedated and intubated, respiratory alkalosis noted via ABG overnight.  Somewhat resolved one hour later following vent changes.  Vital Signs: Temp:  [97.7 F (36.5 C)-98.8 F (37.1 C)] 97.7 F (36.5 C) (09/11 0800) Pulse Rate:  [50-95] 50  (09/11 0700) Resp:  [12-29] 13  (09/11 0700) BP: (108-152)/(35-60) 132/44 mmHg (09/11 0700) SpO2:  [97 %-100 %] 100 % (09/11 0700) FiO2 (%):  [30 %] 30 % (09/11 0737) Weight:  [63.4 kg (139 lb 12.4 oz)] 63.4 kg (139 lb 12.4 oz) (09/11 0000)  Intake/Output Summary (Last 24 hours) at 12/03/11 1005 Last data filed at 12/03/11 0742  Gross per 24 hour  Intake 2552.8 ml  Output   3115 ml  Net -562.2 ml   Physical Examination: General: Restless, appears in mild distress Neuro: Somnolent, moves extremities, follows commands inconsistently HEENT: Intubated, mucus membranes intact but dry. Neck:  Supple Cardiovascular: s1s2 regular, no murmur Lungs: tachypnea, clear but decreased breath sounds Abdomen:  Wound dressings clean. Stoma red small amount of stool noted.  Hypoactive bowel sounds Musculoskeletal:  No edema, moves ext to command Skin:  Midline incision and Left sided colostomy placement, other wise intact. No  rashes.    ASSESSMENT AND PLAN  PULMONARY  Lab 12/03/11 0630 12/03/11 0454 12/02/11 0412 12/01/11 1030 11/28/11 0234 11/27/11 2359  PHART 7.581* 7.614* 7.496* 7.413 -- 7.475*  PCO2ART 34.7* 32.2* 44.0 47.8* -- 35.9  PO2ART 125.0* 140.0* 92.3 230.0* -- 56.3*  HCO3 32.8* 32.9* 33.4* 29.9* -- 26.4*  O2SAT 99.6 99.3 97.5 99.7 43.3 --    CXR:  9/11 Decreased lung volumes with bibasilar atelectasis and small bilateral pleural effusions (left greater than right).  Pulmonary venous congestion  Ventilator Settings: PCV RR 12, PS10, PEEP5, FiO2 of 30%.  ETT:  8/31 >> 9/8 >>> 9/9 >>>  A: 1) Acute respiratory failure in setting of septic shock from peritonitis.  Mental status barrier to extubation. Extubated on 9/8. Patient respirations 40 per minutes on BiPAP. CXR w/ progressive bilateral airspace disease. Likely pulmonary edema and PNA. Restlessness and tachypnea despite precedex infusion. Agitation and anxiety are her major barriers at this point.      2) Respiratory Alkalosis due to pt agitation  P:   F/u ABG More diureses when K better controlled.   Possible trach and PEG 9/13 Family meeting on 9/12 to discuss options PS trials as tolerated PCV F/u cxr in am   CARDIOVASCULAR  Lab 11/28/11 1150 11/28/11 0240 11/28/11 0230 11/27/11 2350  TROPONINI <0.30 <0.30 -- 0.38*  LATICACIDVEN -- -- 2.1 --  PROBNP -- -- -- --   Lines:  [place] CVL 8/31 >>   Echo 9/01>>periapical akinesis, EF 25 to 30%, diffuse hypokinesis, grade 1 diastolic dysfx, mod AR, mild MR, mod TR, mild/mod RV  systolic dysfx, PAS 37 mmHg  A:  1) Septic shock 2nd to peritonitis>>resolved. 2) A fib with RVR>>back in sinus 9/02. 3) Acute systolic CHF>>?if this is sepsis related cardiomyopathy. 4) Hypotension 9/06 more likely related to diuresis, sedation and lopressor use rather than recurrent sepsis (she is negative 5 liters since 9/03). Once again in pulmonary edema.   P:  Keep CVP>8. Restart lopressor, but  decrease to 2.5 mg Q6h.   RENAL  Lab 12/03/11 0440 12/02/11 0500 12/01/11 0408 11/30/11 0500 11/29/11 0500 11/27/11 2350 11/27/11 1745  NA 139 141 141 137 135 -- --  K 2.6* 2.9* -- -- -- -- --  CL 98 102 102 103 97 -- --  CO2 36* 34* 30 31 30  -- --  BUN 31* 27* 22 15 11  -- --  CREATININE 0.46* 0.50 0.47* 0.46* 0.41* -- --  CALCIUM 7.5* 7.3* 7.9* 7.7* 7.5* -- --  MG 1.9 1.7 1.6 -- -- 1.3* 1.4*  PHOS 3.6 2.6 3.7 -- -- 2.9 3.3   Intake/Output      09/10 0701 - 09/11 0700 09/11 0701 - 09/12 0700   I.V. (mL/kg) 491.8 (7.8)    NG/GT 130    IV Piggyback 1532    TPN 750    Total Intake(mL/kg) 2903.8 (45.8)    Urine (mL/kg/hr) 3375 (2.2) 150   Total Output 3375 150   Net -471.2 -150         Foley:  Placed 11/21/2011  A: Hypokalemia, hypomagnesemia P:   Replace magnesium and potassium and and send follow up BMP Monitor renal fx, urine outpt Lasix as ordered.   GASTROINTESTINAL  Lab 12/01/11 0408 11/28/11 1715 11/27/11 2350 11/27/11 0530  AST 20 -- 13 14  ALT 12 -- 9 12  ALKPHOS 148* -- 95 120*  BILITOT 0.4 -- 0.2* 0.2*  PROT 4.4* 3.7* 3.4* 3.9*  ALBUMIN 1.8* -- 1.5* 1.7*   A:  Perforated sigmoid diverticulum s/p laparotomy>>CT abd/pelvis 9/06 not impressive for abscess, but very vague read. Surgery is following.   P:   Post-op care per CCS. TNA started 9/04 per CCS, increased to 92ml/hr. Possible repeat CT scan next couple of days, defer to CCS.  HEMATOLOGIC  Lab 12/03/11 0440 12/02/11 0500 12/01/11 0408 11/30/11 0500 11/29/11 0500 11/28/11 0230  HGB 7.7* 7.9* 8.8* 7.5* 8.6* --  HCT 22.8* 22.6* 26.4* 22.1* 25.0* --  PLT 258 239 305 227 197 --  INR -- -- -- -- -- 1.41  APTT -- -- -- -- -- 36   A:  1)  Follicular B cell lymphoma, currently undergoing chemotherapy. 2)  Anemia of critical illness, and chronic disease. 3)  Thrombocytopenia likely related to sepsis/critical illness>>improved. P:  F/u CBC Transfuse for Hb < 7  INFECTIOUS  Lab 12/03/11 0440  12/02/11 0500 12/01/11 0408 11/30/11 0500 11/29/11 0500 11/28/11 0230  WBC 18.6* 27.5* 49.4* 34.8* 36.5* --  PROCALCITON -- -- -- -- -- 0.29   Cultures: Blood 8/31 >>negative Urine 8/31 >>negative Sputum 9/05 >>Oral flora Rt pleural fluid 9/06>>neg Urine 9/06: E coli (pansens) C diff 9/06>>negative Blood 9/06>> no growth Urine culture 9/11>>  Antibiotics: Cipro 8/31 >>9/06 Flagyl 8/31 (peritonitis)>> Fluconazole 8/31(peritonitis) >> Aztreonam 9/07>> Vancomycin 9/9>>9/11  A:   1) Peritonitis 2nd to sigmoid perforation>>improved. 2) Possible HCAP (no organisms specified)  3) leukocytosis>>she is getting solu cortef, WBCs trending down  GNR in urine cx 9/06. Has had same foley since 8/30 Hx of PCN allergy.  P:   D11/x flagyl, diflucan.  Will stop diflucan will decide of course flagyl over next day or so  D5/8 aztreonam. D/c vanc-->no MRSA identified. F/u culture results and adjust abx as appropriate Change foley catheter, repeat UA and cultures. Place PICC   ENDOCRINE  Lab 12/03/11 0817 12/03/11 0349 12/03/11 0012 12/02/11 2001 12/02/11 1543  GLUCAP 166* 147* 166* 147* 146*   A:  Hyperglycemia. P:  Blood sugar monitoring Q4H and SSI  NEUROLOGIC  A:  Anxiety/ acute encephalopathy  most likely ventilator related.  Currently this is inhibiting possible extubation. P:   Add low dose Seroquel now that we can use her gut   R/o possible infective source.   I have spoken extensively with the patient's son Chrissie Noa who is a cardiologist) and husband separately.  Originally, they were of the idea to proceed with trach/peg but now after discussions they are not sure.  I suggested that we converse in AM as the entire family to make sure everybody is on the same page prior to proceeding.  They are still thinking as of now.  Family meeting in AM before 11 AM.  CC time of 90 minutes.  Patient seen and examined, agree with above note.  I dictated the care and orders written for this  patient under my direction.  Koren Bound, M.D. [phone]

## 2011-12-04 ENCOUNTER — Inpatient Hospital Stay (HOSPITAL_COMMUNITY): Payer: Medicare Other

## 2011-12-04 DIAGNOSIS — D72829 Elevated white blood cell count, unspecified: Secondary | ICD-10-CM

## 2011-12-04 LAB — GLUCOSE, CAPILLARY
Glucose-Capillary: 110 mg/dL — ABNORMAL HIGH (ref 70–99)
Glucose-Capillary: 119 mg/dL — ABNORMAL HIGH (ref 70–99)
Glucose-Capillary: 136 mg/dL — ABNORMAL HIGH (ref 70–99)
Glucose-Capillary: 121 mg/dL — ABNORMAL HIGH (ref 70–99)
Glucose-Capillary: 110 mg/dL — ABNORMAL HIGH (ref 70–99)

## 2011-12-04 LAB — CBC
HCT: 23.5 % — ABNORMAL LOW (ref 36.0–46.0)
MCV: 90.7 fL (ref 78.0–100.0)
Platelets: 302 10*3/uL (ref 150–400)
RBC: 2.59 MIL/uL — ABNORMAL LOW (ref 3.87–5.11)
RDW: 17.8 % — ABNORMAL HIGH (ref 11.5–15.5)
WBC: 14.8 10*3/uL — ABNORMAL HIGH (ref 4.0–10.5)

## 2011-12-04 LAB — COMPREHENSIVE METABOLIC PANEL
ALT: 10 U/L (ref 0–35)
AST: 15 U/L (ref 0–37)
CO2: 37 mEq/L — ABNORMAL HIGH (ref 19–32)
Calcium: 7.4 mg/dL — ABNORMAL LOW (ref 8.4–10.5)
Creatinine, Ser: 0.42 mg/dL — ABNORMAL LOW (ref 0.50–1.10)
GFR calc Af Amer: >90 mL/min (ref >90)
GFR calc non Af Amer: >90 mL/min (ref >90)
Sodium: 139 mEq/L (ref 135–145)
Total Protein: 3.9 g/dL — ABNORMAL LOW (ref 6.0–8.3)

## 2011-12-04 LAB — BLOOD GAS, ARTERIAL
Acid-Base Excess: 11.2 mmol/L — ABNORMAL HIGH (ref 0.0–2.0)
Bicarbonate: 34.1 mEq/L — ABNORMAL HIGH (ref 20.0–24.0)
O2 Saturation: 99.5 %
Patient temperature: 98.6
RATE: 12 resp/min
TCO2: 31.5 mmol/L (ref 0–100)

## 2011-12-04 LAB — PHOSPHORUS: Phosphorus: 3 mg/dL (ref 2.3–4.6)

## 2011-12-04 LAB — CULTURE, BLOOD (ROUTINE X 2)

## 2011-12-04 MED ORDER — SODIUM CHLORIDE 0.9 % IV SOLN
10.0000 ug/h | INTRAVENOUS | Status: DC
  Administered 2011-12-04: 50 ug/h via INTRAVENOUS
  Filled 2011-12-04: qty 50

## 2011-12-04 MED ORDER — VECURONIUM BROMIDE 10 MG IV SOLR
10.0000 mg | Freq: Once | INTRAVENOUS | Status: AC
  Administered 2011-12-05: 10 mg via INTRAVENOUS
  Filled 2011-12-04: qty 10

## 2011-12-04 MED ORDER — FENTANYL CITRATE 0.05 MG/ML IJ SOLN: 200.0000 ug | Freq: Once | INTRAMUSCULAR | Status: DC

## 2011-12-04 MED ORDER — PROPOFOL 10 MG/ML IV EMUL
5.0000 ug/kg/min | Freq: Once | INTRAVENOUS | Status: AC
  Administered 2011-12-05: 20 ug/kg/min via INTRAVENOUS

## 2011-12-04 MED ORDER — MAGNESIUM SULFATE 50 % IJ SOLN: 2.0000 g | Freq: Once | INTRAVENOUS | Status: DC

## 2011-12-04 MED ORDER — VECURONIUM BROMIDE 10 MG IV SOLR: 10.0000 mg | Freq: Once | INTRAVENOUS | Status: DC

## 2011-12-04 MED ORDER — SODIUM CHLORIDE 0.9 % IV BOLUS (SEPSIS)
1000.0000 mL | Freq: Once | INTRAVENOUS | Status: AC
  Administered 2011-12-04: 1000 mL via INTRAVENOUS

## 2011-12-04 MED ORDER — PROPOFOL 10 MG/ML IV EMUL: 5.0000 ug/kg/min | Freq: Once | INTRAVENOUS | Status: DC

## 2011-12-04 MED ORDER — HEPARIN SOD (PORK) LOCK FLUSH 100 UNIT/ML IV SOLN
500.0000 [IU] | INTRAVENOUS | Status: AC | PRN
  Administered 2011-12-04: 500 [IU]

## 2011-12-04 MED ORDER — INSULIN REGULAR HUMAN 100 UNIT/ML IJ SOLN
INTRAVENOUS | Status: DC
  Administered 2011-12-04: 18:00:00 via INTRAVENOUS
  Filled 2011-12-04: qty 1000

## 2011-12-04 MED ORDER — MIDAZOLAM HCL 5 MG/ML IJ SOLN
5.0000 mg | Freq: Once | INTRAMUSCULAR | Status: AC
  Administered 2011-12-05: 2 mg via INTRAVENOUS
  Filled 2011-12-04: qty 1

## 2011-12-04 MED ORDER — ETOMIDATE 2 MG/ML IV SOLN
40.0000 mg | Freq: Once | INTRAVENOUS | Status: DC
  Filled 2011-12-04: qty 20

## 2011-12-04 MED ORDER — MAGNESIUM SULFATE 40 MG/ML IJ SOLN
2.0000 g | Freq: Once | INTRAMUSCULAR | Status: AC
  Administered 2011-12-04: 2 g via INTRAVENOUS
  Filled 2011-12-04: qty 50

## 2011-12-04 MED ORDER — SODIUM CHLORIDE 0.9 % IJ SOLN: 10.0000 mL | INTRAMUSCULAR | Status: DC | PRN

## 2011-12-04 MED ORDER — FENTANYL CITRATE 0.05 MG/ML IJ SOLN
200.0000 ug | Freq: Once | INTRAMUSCULAR | Status: AC
  Administered 2011-12-05: 200 ug via INTRAVENOUS
  Filled 2011-12-04: qty 4

## 2011-12-04 MED ORDER — FUROSEMIDE 10 MG/ML IJ SOLN
40.0000 mg | Freq: Four times a day (QID) | INTRAMUSCULAR | Status: AC
  Administered 2011-12-04 – 2011-12-05 (×3): 40 mg via INTRAVENOUS
  Filled 2011-12-04 (×3): qty 4

## 2011-12-04 MED ORDER — MIDAZOLAM HCL 5 MG/ML IJ SOLN: 5.0000 mg | Freq: Once | INTRAMUSCULAR | Status: DC

## 2011-12-04 MED ORDER — POTASSIUM CHLORIDE 20 MEQ/15ML (10%) PO LIQD
40.0000 meq | Freq: Once | ORAL | Status: AC
  Administered 2011-12-04: 40 meq
  Filled 2011-12-04: qty 30

## 2011-12-04 MED ORDER — ETOMIDATE 2 MG/ML IV SOLN
40.0000 mg | Freq: Once | INTRAVENOUS | Status: AC
  Administered 2011-12-05: 20 mg via INTRAVENOUS
  Filled 2011-12-04: qty 20

## 2011-12-04 MED ORDER — SODIUM CHLORIDE 0.9 % IJ SOLN
10.0000 mL | Freq: Two times a day (BID) | INTRAMUSCULAR | Status: DC
  Administered 2011-12-04 – 2011-12-07 (×7): 10 mL
  Administered 2011-12-08: 30 mL
  Administered 2011-12-09: 10 mL

## 2011-12-04 MED ORDER — POTASSIUM CHLORIDE 10 MEQ/50ML IV SOLN
10.0000 meq | INTRAVENOUS | Status: AC
  Administered 2011-12-04 (×4): 10 meq via INTRAVENOUS
  Filled 2011-12-04 (×4): qty 50

## 2011-12-04 NOTE — Progress Notes (Signed)
Porta cath deaccessed. Flushed with 10cc NS followed by Heparin 5ml (100u/ml). No bleeding to site, band aid to site for comfort. Ailee Pates M 

## 2011-12-04 NOTE — Progress Notes (Signed)
eLink Physician-Brief Progress Note Patient Name: [name] DOB: 1931/06/11 MRN: [id number]  Date of Service  12/04/2011   HPI/Events of Note   precedex order expired  eICU Interventions  Use fent gtt, trach planned in am   Intervention Category Minor Interventions: Agitation / anxiety - evaluation and management  ALVA,RAKESH V. 12/04/2011, 7:52 PM

## 2011-12-04 NOTE — Progress Notes (Signed)
12 Days Post-Op  Subjective: Intubated, sedated, family conference results noted and I discussed with Dr. Molli Knock  Objective: Vital signs in last 24 hours: Temp:  [97.2 F (36.2 C)-98.7 F (37.1 C)] 97.2 F (36.2 C) (09/12 0800) Pulse Rate:  [50-80] 65  (09/12 0900) Resp:  [12-19] 16  (09/12 0900) BP: (110-157)/(43-67) 141/60 mmHg (09/12 0900) SpO2:  [99 %-100 %] 100 % (09/12 0900) FiO2 (%):  [30 %] 30 % (09/12 1142) Last BM Date: 12/04/11  Intake/Output from previous day: 09/11 0701 - 09/12 0700 In: 4455.6 [I.V.:975.6; NG/GT:300; IV Piggyback:1900; TPN:1040] Out: 5035 [Urine:4960; Stool:75] Intake/Output this shift: Total I/O In: 507 [I.V.:77; NG/GT:60; IV Piggyback:250; TPN:120] Out: -   General appearance: intubated sedated CV: rrr Pulm: coarse bilaterally Ab wound clean, partially open, bs present, ostomy pink and viable  Lab Results:   Basename 12/04/11 0510 12/03/11 0440  WBC 14.8* 18.6*  HGB 7.9* 7.7*  HCT 23.5* 22.8*  PLT 302 258   BMET  Basename 12/04/11 0510 12/03/11 1700  NA 139 137  K 2.8* 3.9  CL 98 101  CO2 37* 35*  GLUCOSE 112* 138*  BUN 28* 27*  CREATININE 0.42* 0.40*  CALCIUM 7.4* 7.3*   PT/INR No results found for this basename: LABPROT:2,INR:2 in the last 72 hours ABG  Basename 12/04/11 0455 12/03/11 0630  PHART 7.583* 7.581*  HCO3 34.1* 32.8*    Studies/Results: Dg Chest Port 1 View  12/04/2011  *RADIOLOGY REPORT*  Clinical Data: Evaluate endotracheal tube position.  PORTABLE CHEST - 1 VIEW  Comparison: Chest x-ray of 12/03/2011.  Findings: An endotracheal tube is in place with tip 6.6 cm above the carina. There is a left-sided subclavian central venous catheter with tip terminating in the the right atrium. A nasogastric tube is seen extending into the stomach, however, the tip of the nasogastric tube extends below the lower margin of the image.  Right internal jugular single lumen Port-A-Cath with tip terminating in the superior  cavoatrial junction.  Lung volumes are low with unchanged bibasilar opacities which may represent areas of atelectasis and/or consolidation, with superimposed small-moderate bilateral pleural effusions.  Pulmonary venous congestion without frank pulmonary edema.  Heart size is upper limits of normal. Mediastinal contours are unremarkable.  IMPRESSION: 1.  Support apparatus, as above. Endotracheal tube now appears appropriately positioned. 2.  Otherwise, the radiographic appearance of chest is unchanged, as above.   Original Report Authenticated By: Florencia Reasons, M.D.    Dg Chest Port 1 View  12/03/2011  *RADIOLOGY REPORT*  Clinical Data: Evaluate endotracheal tube placement.  PORTABLE CHEST - 1 VIEW  Comparison: Chest x-ray 12/02/2011.  Findings: An endotracheal tube is in place with tip 1.4 cm above the carina. There is a left-sided subclavian central venous catheter with tip terminating in the superior cavoatrial junction. A nasogastric tube is seen extending into the stomach, however, the tip of the nasogastric tube extends below the lower margin of the image.  The right IJ single lumen power Port-A-Cath with tip terminating in the distal superior vena cava.  Extensive bibasilar opacities persist, compatible with atelectasis and/or consolidation with superimposed small - moderate bilateral pleural effusions (left greater than right).  Pulmonary venous engorgement, without frank pulmonary edema.  Cardiac silhouette is largely obscured. The patient is rotated to the left on today's exam, resulting in distortion of the mediastinal contours and reduced diagnostic sensitivity and specificity for mediastinal pathology. Atherosclerosis of the thoracic aorta.  IMPRESSION: 1.  Support apparatus, as above. 2.  Slightly decreased lung volumes with persistent bibasilar atelectasis and/or consolidation and superimposed small - moderate bilateral pleural effusions (left greater than right). 3.  Pulmonary venous  congestion without frank pulmonary edema.   Original Report Authenticated By: Florencia Reasons, M.D.     Anti-infectives: Anti-infectives     Start     Dose/Rate Route Frequency Ordered Stop   12/01/11 2200   vancomycin (VANCOCIN) 500 mg in sodium chloride 0.9 % 100 mL IVPB  Status:  Discontinued        500 mg 100 mL/hr over 60 Minutes Intravenous Every 12 hours 12/01/11 1035 12/03/11 1159   12/01/11 1200   vancomycin (VANCOCIN) 1,250 mg in sodium chloride 0.9 % 250 mL IVPB        1,250 mg 166.7 mL/hr over 90 Minutes Intravenous  Once 12/01/11 1035 12/01/11 1305   11/29/11 1000   aztreonam (AZACTAM) 2 g in dextrose 5 % 50 mL IVPB        2 g 100 mL/hr over 30 Minutes Intravenous Every 8 hours 11/29/11 0907     11/28/11 1200   aztreonam (AZACTAM) 2 g in dextrose 5 % 50 mL IVPB  Status:  Discontinued        2 g 100 mL/hr over 30 Minutes Intravenous Every 12 hours 11/28/11 0240 11/28/11 0903   11/28/11 1000   fluconazole (DIFLUCAN) IVPB 200 mg  Status:  Discontinued        200 mg 100 mL/hr over 60 Minutes Intravenous Every 24 hours 11/28/11 0915 12/03/11 1159   11/28/11 0200   micafungin (MYCAMINE) 100 mg in sodium chloride 0.9 % 100 mL IVPB  Status:  Discontinued     Comments: Pharmacy to verify the dose      100 mg 100 mL/hr over 1 Hours Intravenous Daily at bedtime 11/28/11 0133 11/28/11 0903   11/28/11 0130   aztreonam (AZACTAM) 2 g in dextrose 5 % 50 mL IVPB        2 g 100 mL/hr over 30 Minutes Intravenous  Once 11/28/11 0130 11/28/11 0230   11/23/11 0300   fluconazole (DIFLUCAN) IVPB 200 mg  Status:  Discontinued        200 mg 100 mL/hr over 60 Minutes Intravenous Every 24 hours 11/22/11 0254 11/26/11 1442   11/22/11 0300   fluconazole (DIFLUCAN) IVPB 400 mg        400 mg 200 mL/hr over 60 Minutes Intravenous  Once 11/22/11 0254 11/22/11 0518   11/22/11 0230   metroNIDAZOLE (FLAGYL) IVPB 500 mg  Status:  Discontinued        500 mg 100 mL/hr over 60 Minutes  Intravenous  Once 11/22/11 0229 11/22/11 0230   11/21/11 2230   ciprofloxacin (CIPRO) IVPB 400 mg  Status:  Discontinued        400 mg 200 mL/hr over 60 Minutes Intravenous 2 times daily 11/21/11 2207 11/29/11 0840   11/21/11 2230   metroNIDAZOLE (FLAGYL) IVPB 500 mg        500 mg 100 mL/hr over 60 Minutes Intravenous 3 times per day 11/21/11 2207     11/21/11 2100   meropenem (MERREM) 1 g in sodium chloride 0.9 % 100 mL IVPB  Status:  Discontinued        1 g 200 mL/hr over 30 Minutes Intravenous 3 times per day 11/21/11 2013 11/21/11 2146          Assessment/Plan: 1. S/p ex lap with Hartman's  2. VDRF  3. PNA  Plan:  1. Critical care per CCM, appreciate all their assistance.  Agree with plan for trach and would agree that perc trach is best option 2. Leave tube feeds at 20 cc per hour 3. Cont wound care     LOS: 13 days    [facility] 12/04/2011

## 2011-12-04 NOTE — Progress Notes (Signed)
Brief Nutrition Note  Pt remains intubated on TF and TNA.  Pt for trach in am.  Tolerating TF well.  Stool per ileostomy.  Will not increase TF today per discussion with MD.  Decision regarding PEG is pending.  Oran Rein, RD, LDN Clinical Inpatient Dietitian Pager:  [phone] Weekend and after hours pager:  [phone]

## 2011-12-04 NOTE — Progress Notes (Signed)
Name: [name] MRN: [id number] DOB: October 13, 1931    LOS: 13  Referring Provider:  Dr. Gerrit Friends  Reason for Referral:  Septic shock  PULMONARY / CRITICAL CARE MEDICINE  HPI:  78 yo female admitted 11/21/2011 following several days of abdominal pain.  Found to have perforated sigmoid diverticulum with peritonitis, and pelvic abscess.  Had laparotomy 8/31.  Developed respiratory failure and shock post-op.  PCCM consulted 8/31.  She has hx of B cell lymphoma currently on chemo as outpt.  Events Since Admission: 8/31 Laparotomy, sigmoid colectomy and ostomy 9/01 A fib with RVR 9/02 Off pressors 9/06 A fib RVR>>back to sinus; added precedex for agitation; hypotensive.  Rt thoracentesis (transudate). 9/9 Reintubated.  Subjective:  Sedated and intubated, respiratory alkalosis noted via ABG overnight.  Somewhat resolved one hour later following vent changes.  Vital Signs: Temp:  [97.2 F (36.2 C)-99.1 F (37.3 C)] 97.2 F (36.2 C) (09/12 0800) Pulse Rate:  [50-86] 50  (09/12 0600) Resp:  [12-19] 12  (09/12 0600) BP: (110-157)/(47-67) 148/50 mmHg (09/12 0600) SpO2:  [99 %-100 %] 100 % (09/12 0600) FiO2 (%):  [30 %] 30 % (09/12 0816)  Intake/Output Summary (Last 24 hours) at 12/04/11 1005 Last data filed at 12/04/11 0700  Gross per 24 hour  Intake 4075.35 ml  Output   4760 ml  Net -684.65 ml   Physical Examination: General: Restless, appears in mild distress Neuro: Somnolent, moves extremities, follows commands inconsistently HEENT: Intubated, mucus membranes intact but dry. Neck:  Supple Cardiovascular: s1s2 regular, no murmur Lungs: tachypnea, clear but decreased breath sounds Abdomen:  Wound dressings clean. Stoma red small amount of stool noted.  Hypoactive bowel sounds Musculoskeletal:  No edema, moves ext to command Skin:  Midline incision and Left sided colostomy placement, other wise intact. No rashes.    ASSESSMENT AND PLAN  PULMONARY  Lab 12/04/11 0455 12/03/11  0630 12/03/11 0454 12/02/11 0412 12/01/11 1030  PHART 7.583* 7.581* 7.614* 7.496* 7.413  PCO2ART 36.0 34.7* 32.2* 44.0 47.8*  PO2ART 113.0* 125.0* 140.0* 92.3 230.0*  HCO3 34.1* 32.8* 32.9* 33.4* 29.9*  O2SAT 99.5 99.6 99.3 97.5 99.7    CXR:  9/11 Decreased lung volumes with bibasilar atelectasis and small bilateral pleural effusions (left greater than right).  Pulmonary venous congestion  Ventilator Settings: PCV RR 12, PS10, PEEP5, FiO2 of 30%.  ETT:  8/31 >> 9/8 >>> 9/9 >>>  A: 1) Acute respiratory failure in setting of septic shock from peritonitis.  Mental status barrier to extubation. Extubated on 9/8. Patient respirations 40 per minutes on BiPAP. CXR w/ progressive bilateral airspace disease. Likely pulmonary edema and PNA. Restlessness and tachypnea despite precedex infusion. Agitation and anxiety are her major barriers at this point.      2) Respiratory Alkalosis due to pt agitation  P:   F/u ABG. More diureses when K better controlled.   Trach in AM (9/13). Spoke with family at length, decided to trach but PEG pending. PS trials as tolerated. PCV. F/u cxr in am.  CARDIOVASCULAR  Lab 11/28/11 1150 11/28/11 0240 11/28/11 0230 11/27/11 2350  TROPONINI <0.30 <0.30 -- 0.38*  LATICACIDVEN -- -- 2.1 --  PROBNP -- -- -- --   Lines:  [place] CVL 8/31 >>   Echo 9/01>>periapical akinesis, EF 25 to 30%, diffuse hypokinesis, grade 1 diastolic dysfx, mod AR, mild MR, mod TR, mild/mod RV systolic dysfx, PAS 37 mmHg  A:  1) Septic shock 2nd to peritonitis>>resolved. 2) A fib with RVR>>back in sinus  9/02. 3) Acute systolic CHF>>?if this is sepsis related cardiomyopathy. 4) Hypotension 9/06 more likely related to diuresis, sedation and lopressor use rather than recurrent sepsis (she is negative 5 liters since 9/03). Once again in pulmonary edema.   P:  Keep CVP>8. Restart lopressor, change to Lopressor 12.5 mg PO BID.  RENAL  Lab 12/04/11 0510 12/03/11 1700 12/03/11 0440  12/02/11 0500 12/01/11 0408 11/27/11 2350  NA 139 137 139 141 141 --  K 2.8* 3.9 -- -- -- --  CL 98 101 98 102 102 --  CO2 37* 35* 36* 34* 30 --  BUN 28* 27* 31* 27* 22 --  CREATININE 0.42* 0.40* 0.46* 0.50 0.47* --  CALCIUM 7.4* 7.3* 7.5* 7.3* 7.9* --  MG 1.8 2.1 1.9 1.7 1.6 --  PHOS 3.0 -- 3.6 2.6 3.7 2.9   Intake/Output      09/11 0701 - 09/12 0700 09/12 0701 - 09/13 0700   I.V. (mL/kg) 955.6 (15.1)    Other 240    NG/GT 300    IV Piggyback 1900    TPN 1040    Total Intake(mL/kg) 4435.6 (70)    Urine (mL/kg/hr) 4960 (3.3)    Stool 75    Total Output 5035    Net -599.5          Foley:  Placed 11/21/2011  A: Hypokalemia, hypomagnesemia P:   Replace magnesium and potassium and and send follow up BMP Monitor renal fx, urine outpt Lasix as ordered.  GASTROINTESTINAL  Lab 12/04/11 0510 12/01/11 0408 11/28/11 1715 11/27/11 2350  AST 15 20 -- 13  ALT 10 12 -- 9  ALKPHOS 133* 148* -- 95  BILITOT 0.3 0.4 -- 0.2*  PROT 3.9* 4.4* 3.7* 3.4*  ALBUMIN 1.5* 1.8* -- 1.5*   A:  Perforated sigmoid diverticulum s/p laparotomy>>CT abd/pelvis 9/06 not impressive for abscess, but very vague read. Surgery is following.   P:   Post-op care per CCS. TF started 9/08 per CCS, increased to 44ml/hr. Possible repeat CT scan next couple of days, defer to CCS given pelvic fluid collection.  HEMATOLOGIC  Lab 12/04/11 0510 12/03/11 0440 12/02/11 0500 12/01/11 0408 11/30/11 0500 11/28/11 0230  HGB 7.9* 7.7* 7.9* 8.8* 7.5* --  HCT 23.5* 22.8* 22.6* 26.4* 22.1* --  PLT 302 258 239 305 227 --  INR -- -- -- -- -- 1.41  APTT -- -- -- -- -- 36   A:  1)  Follicular B cell lymphoma, currently undergoing chemotherapy. 2)  Anemia of critical illness, and chronic disease. 3)  Thrombocytopenia likely related to sepsis/critical illness>>improved. P:  F/u CBC Transfuse for Hb < 7 LFTs in AM for ?albumen and liver function  INFECTIOUS  Lab 12/04/11 0510 12/03/11 0440 12/02/11 0500 12/01/11 0408  11/30/11 0500 11/28/11 0230  WBC 14.8* 18.6* 27.5* 49.4* 34.8* --  PROCALCITON -- -- -- -- -- 0.29   Cultures: Blood 8/31 >>negative Urine 8/31 >>negative Sputum 9/05 >>Oral flora Rt pleural fluid 9/06>>neg Urine 9/06: E coli (pansens) C diff 9/06>>negative Blood 9/06>> no growth Urine culture 9/11>>  Antibiotics: Cipro 8/31 >>9/06 Flagyl 8/31 (peritonitis)>> Fluconazole 8/31(peritonitis) >> Aztreonam 9/07>> Vancomycin 9/9>>9/11  A:   1) Peritonitis 2nd to sigmoid perforation>>improved. 2) Possible HCAP (no organisms specified)  3) leukocytosis>>she is getting solu cortef, WBCs trending down  GNR in urine cx 9/06. Has had same foley since 8/30 Hx of PCN allergy.  P:   D13/x flagyl, diflucan. Will stop diflucan will decide of course flagyl over next day  or so  D6/8 aztreonam. D/c vanc-->no MRSA identified. F/u culture results and adjust abx as appropriate Change foley catheter, repeat UA and cultures. Place PICC   ENDOCRINE  Lab 12/04/11 0753 12/04/11 0405 12/03/11 2355 12/03/11 2042 12/03/11 1605  GLUCAP 119* 110* 156* 117* 122*   A:  Hyperglycemia. P:  Blood sugar monitoring Q4H and SSI  NEUROLOGIC  A:  Anxiety/ acute encephalopathy  most likely ventilator related.  Currently this is inhibiting possible extubation. P:   Add low dose Seroquel now that we can use her gut   R/o possible infective source.   Spoke with one of the sons and husband with the other son on the phone.  There was evidently some disagreement on course of action but after discussion on multiple occasions throughout the day decided that we will proceed with tracheostomy in AM on 9/13.  CC time of 90 minutes.  Alyson Reedy, M.D. [facility] Pulmonary/Critical Care Medicine. Pager: [phone]. After hours pager: [phone].

## 2011-12-04 NOTE — Progress Notes (Signed)
eLink Physician-Brief Progress Note Patient Name: [name] DOB: 29-Jun-1931 MRN: [id number]  Date of Service  12/04/2011   HPI/Events of Note    Lab 12/04/11 0510 12/03/11 1700 12/03/11 0440 12/02/11 0500 12/01/11 0408 11/27/11 2350  NA 139 137 139 141 141 --  K 2.8* 3.9 -- -- -- --  CL 98 101 98 102 102 --  CO2 37* 35* 36* 34* 30 --  GLUCOSE 112* 138* 163* 145* 150* --  BUN 28* 27* 31* 27* 22 --  CREATININE 0.42* 0.40* 0.46* 0.50 0.47* --  CALCIUM 7.4* 7.3* 7.5* 7.3* 7.9* --  MG 1.8 2.1 1.9 1.7 1.6 --  PHOS 3.0 -- 3.6 2.6 3.7 2.9   Estimated Creatinine Clearance: 47.7 ml/min (by C-G formula based on Cr of 0.42). Intake/Output      09/11 0701 - 09/12 0700   I.V. (mL/kg) 720.6 (11.4)   Other 240   NG/GT 200   IV Piggyback 750   TPN 990   Total Intake(mL/kg) 2900.6 (45.8)   Urine (mL/kg/hr) 3610 (2.4)   Stool 50   Total Output 3660   Net -759.5          eICU Interventions  kcl 40 x via cvl + kcl 40 via ng mag 2gm  x1   Intervention Category Intermediate Interventions: Electrolyte abnormality - evaluation and management  Talise Sligh 12/04/2011, 6:32 AM

## 2011-12-04 NOTE — Progress Notes (Signed)
eLink Physician-Brief Progress Note Patient Name: [name] DOB: 1931/10/31 MRN: [id number]  Date of Service  12/04/2011   HPI/Events of Note   For trach  eICU Interventions  npo      [name] 12/04/2011, 11:17 PM

## 2011-12-04 NOTE — Progress Notes (Signed)
PARENTERAL NUTRITION CONSULT NOTE   Pharmacy Consult for TPN Indication: Prolonged ileus  Allergies  Allergen Reactions  . Codeine Hives  . Other Nausea And Vomiting    anesthesia  . Penicillins Hives  . Sulfa Antibiotics Hives   Patient Measurements: Height: 4\' 11"  (149.9 cm) (4.11) Weight: 139 lb 12.4 oz (63.4 kg) IBW/kg (Calculated) : 43.2  Adjusted Body Weight: 50.4  Vital Signs: Temp: 97.2 F (36.2 C) (09/12 0800) Temp src: Axillary (09/12 0800) BP: 141/60 mmHg (09/12 0900) Pulse Rate: 65  (09/12 0900) Intake/Output from previous day: 09/11 0701 - 09/12 0700 In: 4455.6 [I.V.:975.6; NG/GT:300; IV Piggyback:1900; TPN:1040] Out: 5035 [Urine:4960; Stool:75]  Labs:  [facility] 12/04/11 0510 12/03/11 0440 12/02/11 0500  WBC 14.8* 18.6* 27.5*  HGB 7.9* 7.7* 7.9*  HCT 23.5* 22.8* 22.6*  PLT 302 258 239  APTT -- -- --  INR -- -- --    Basename 12/04/11 0510 12/03/11 1700 12/03/11 0440 12/02/11 0500  NA 139 137 139 --  K 2.8* 3.9 2.6* --  CL 98 101 98 --  CO2 37* 35* 36* --  GLUCOSE 112* 138* 163* --  BUN 28* 27* 31* --  CREATININE 0.42* 0.40* 0.46* --  LABCREA -- -- -- --  CREAT24HRUR -- -- -- --  CALCIUM 7.4* 7.3* 7.5* --  MG 1.8 2.1 1.9 --  PHOS 3.0 -- 3.6 2.6  PROT 3.9* -- -- --  ALBUMIN 1.5* -- -- --  AST 15 -- -- --  ALT 10 -- -- --  ALKPHOS 133* -- -- --  BILITOT 0.3 -- -- --  BILIDIR -- -- -- --  IBILI -- -- -- --  PREALBUMIN -- -- -- --  TRIG -- -- -- --  CHOLHDL -- -- -- --  CHOL -- -- -- --   Estimated Creatinine Clearance: 47.7 ml/min (by C-G formula based on Cr of 0.42).    Basename 12/04/11 0753 12/04/11 0405 12/03/11 2355  GLUCAP 119* 110* 156*   CBGs & Insulin requirements past 24 hours:   CBGs = 110-156  Resistant SSI Q4h: 15 units given  Regular insulin in TNA bag: 15 units/L Clinimix  Nutritional Goals:   RD recs 12/02/11: re-estimated needs 1118 kcal, 80-90 gm protein  Current nutrition:   Diet: NPO  Tube feeds:  Vital AF 1.2 at 20 ml/hr (provides 36g protein and 576 kcal)  Clinimix E 5/15 at 30 ml/hr (+ Lipids MWF) + Tube Feeds provides: 72 g/day protein and an average of 1292 Kcal/day (1568 Kcal/day MWF, 1088 Kcal/day STTHS).  IVF: NS at [facility]  Assessment:   78 yo F admit on 8/30 w/ ruptured sigmoid diverticulum, peritonitis, and pelvic abscess. S/p laparotomy 8/31.  TNA started 9/4 for prolonged ileus. CT abd/pelvis 9/6 Cannot exclude infected ascites or developing early abscess (MD doesn't think it's impressive).  Of note, she has B-Cell Lymphoma currently receiving chemotherapy.   Surgery does not want to advance tube feeds past 20 ml/hr for now.  With extra nutrition provided via TFs, TNA rate was reduced yesterday to avoid overfeeding.    CBGs: mostly at goal of <150 mg/dL providing insulin 15 units/L in TNA and SSI (resistant scale q4h).   Renal/hepatic function:  WNL  Electrolytes: K=2.8 (MD has already ordered replacement of 8 runs). Corrected Ca WNL. HCO3 elevated. Other lytes ok.  Pre-Albumin: 11.7 (9/4), 22.6 (9/9)  TG/Cholesterol: wnl 9/8  GI prophylaxis:  pantoprazole  Plan:  At 1800 tonight Continue Clinimix E 5/15 at 30 ml/hr since  patient tolerating TFs at 20 ml/hr. Provide 15 units/L of insulin in TNA.  Continue current resistant SSI. TNA to contain fat emulsion, standard multivitamins and trace elements MWF only due to ongoing shortage Continue IVF at [facility] K+ replacement per MD TNA lab panels on Mondays & Thursdays. F/u daily.  Labs ordered for AM.  [name], PharmD, BCPS Pager: [phone] 12/04/2011 11:31 AM

## 2011-12-04 NOTE — Progress Notes (Signed)
ANTIBIOTIC CONSULT NOTE - Follow Up  Pharmacy Consult for aztreonam Indication: perforated colon w/ peritonitis, possible PNA  Allergies  Allergen Reactions  . Codeine Hives  . Other Nausea And Vomiting    anesthesia  . Penicillins Hives  . Sulfa Antibiotics Hives    Patient Measurements: Height: 4\' 11"  (149.9 cm) (4.11) Weight: 139 lb 12.4 oz (63.4 kg) IBW/kg (Calculated) : 43.2   Vital Signs: Temp: 97.7 F (36.5 C) (09/12 1200) Temp src: Axillary (09/12 1200) BP: 96/38 mmHg (09/12 1249) Pulse Rate: 58  (09/12 1249) Intake/Output from previous day: 09/11 0701 - 09/12 0700 In: 4455.6 [I.V.:975.6; NG/GT:300; IV Piggyback:1900; TPN:1040] Out: 5035 [Urine:4960; Stool:75] Intake/Output from this shift: Total I/O In: 507 [I.V.:77; NG/GT:60; IV Piggyback:250; TPN:120] Out: -   Labs:  Basename 12/04/11 0510 12/03/11 1700 12/03/11 0440 12/02/11 0500  WBC 14.8* -- 18.6* 27.5*  HGB 7.9* -- 7.7* 7.9*  PLT 302 -- 258 239  LABCREA -- -- -- --  CREATININE 0.42* 0.40* 0.46* --   Estimated Creatinine Clearance: 47.7 ml/min (by C-G formula based on Cr of 0.42).    Microbiology: 8/30 blood: ng-final 8/31 blood: ng-final 8/31 mrsa pcr: neg 8/31 urine: ng-final 9/5 trach aspirate: normal flora 9/6 urine:15K e. Coli (R to amp, FQ) 9/6 c.diff: neg 9/6 blood x1: NGTD 9/6: pleural fluid: NGTD 9/9: C diff: neg   Assessment: HPI: 78 year old female with B-cell lymphoma undergoing chemotherapy presents 8/30 with worsening abdominal pain. CT showed perforated diverticular disease with abscesses. IV Cipro and Flagyl were initiated with plans for laparotomy with resection and colostomy on 8/31. Pt transferred to ICU. Developed respiratory failure and worsening septic shock. Pt intubated and septic shock protocol initiated.   8/30 >> Flagyl >> 8/30 >> Cipro >> 9/7 8/31 >> Fluconazole >> 9/4, restart 9/6 >> 9/11 9/5 Aztreonam x 1 9/6 Micafungin x 1  9/7 >> aztreonam >> 9/9 >>  vanco >> 9/11  Today is D14/x Flagyl, D7/8 aztreonam, renal function is stable, cultures unrevealing.  WBC is elevated, s/p Neulasta on 8/24, suspect contributing to elevation in WBC (pt also on steroids).  Plan for trach tomorrow.  Plan:  Dose remains appropriate for aztreonam at 2g IV q8h.  Per CCM note, plan is to continue aztreonam x 1 more day.  LOT for Flagyl not yet defined.  [name] 12/04/2011,1:39 PM

## 2011-12-05 ENCOUNTER — Other Ambulatory Visit: Payer: Medicare Other

## 2011-12-05 ENCOUNTER — Inpatient Hospital Stay (HOSPITAL_COMMUNITY): Payer: Medicare Other

## 2011-12-05 ENCOUNTER — Ambulatory Visit: Payer: Medicare Other

## 2011-12-05 ENCOUNTER — Ambulatory Visit: Payer: Medicare Other | Admitting: Oncology

## 2011-12-05 LAB — BLOOD GAS, ARTERIAL
Bicarbonate: 30.8 mEq/L — ABNORMAL HIGH (ref 20.0–24.0)
Patient temperature: 98.5
TCO2: 28.9 mmol/L (ref 0–100)
pH, Arterial: 7.509 — ABNORMAL HIGH (ref 7.350–7.450)

## 2011-12-05 LAB — CBC
MCV: 90.7 fL (ref 78.0–100.0)
Platelets: 286 10*3/uL (ref 150–400)
RDW: 18.2 % — ABNORMAL HIGH (ref 11.5–15.5)
WBC: 14.9 10*3/uL — ABNORMAL HIGH (ref 4.0–10.5)

## 2011-12-05 LAB — GLUCOSE, CAPILLARY
Glucose-Capillary: 78 mg/dL (ref 70–99)
Glucose-Capillary: 91 mg/dL (ref 70–99)
Glucose-Capillary: 110 mg/dL — ABNORMAL HIGH (ref 70–99)

## 2011-12-05 LAB — BASIC METABOLIC PANEL
CO2: 34 mEq/L — ABNORMAL HIGH (ref 19–32)
GFR calc non Af Amer: >90 mL/min (ref >90)
Glucose, Bld: 84 mg/dL (ref 70–99)
Potassium: 2.8 mEq/L — ABNORMAL LOW (ref 3.5–5.1)
Sodium: 137 mEq/L (ref 135–145)

## 2011-12-05 LAB — APTT: aPTT: 25 seconds (ref 24–37)

## 2011-12-05 LAB — URINE CULTURE
Colony Count: NO GROWTH
Culture: NO GROWTH

## 2011-12-05 LAB — TROPONIN I: Troponin I: <0.3 ng/mL (ref <0.30)

## 2011-12-05 LAB — MAGNESIUM: Magnesium: 1.8 mg/dL (ref 1.5–2.5)

## 2011-12-05 LAB — PHOSPHORUS: Phosphorus: 2.8 mg/dL (ref 2.3–4.6)

## 2011-12-05 LAB — HEPATIC FUNCTION PANEL
ALT: 12 U/L (ref 0–35)
AST: 18 U/L (ref 0–37)
Bilirubin, Direct: <0.1 mg/dL (ref 0.0–0.3)

## 2011-12-05 MED ORDER — DEXMEDETOMIDINE HCL IN NACL 400 MCG/100ML IV SOLN
0.4000 ug/kg/h | INTRAVENOUS | Status: DC
  Administered 2011-12-05: 1 ug/kg/h via INTRAVENOUS
  Administered 2011-12-05: 1.2 ug/kg/h via INTRAVENOUS
  Filled 2011-12-05 (×3): qty 100

## 2011-12-05 MED ORDER — DEXMEDETOMIDINE HCL IN NACL 200 MCG/50ML IV SOLN: 0.2000 ug/kg/h | INTRAVENOUS | Status: DC

## 2011-12-05 MED ORDER — ZINC TRACE METAL 1 MG/ML IV SOLN
INTRAVENOUS | Status: AC
  Administered 2011-12-05: 18:00:00 via INTRAVENOUS
  Filled 2011-12-05: qty 1000

## 2011-12-05 MED ORDER — MAGNESIUM SULFATE 40 MG/ML IJ SOLN
2.0000 g | Freq: Once | INTRAMUSCULAR | Status: AC
  Administered 2011-12-05: 2 g via INTRAVENOUS
  Filled 2011-12-05: qty 50

## 2011-12-05 MED ORDER — VITAL AF 1.2 CAL PO LIQD
1000.0000 mL | ORAL | Status: DC
  Administered 2011-12-05 (×2): 1000 mL
  Filled 2011-12-05 (×5): qty 1000

## 2011-12-05 MED ORDER — POTASSIUM CHLORIDE 10 MEQ/50ML IV SOLN
10.0000 meq | INTRAVENOUS | Status: AC
  Administered 2011-12-05 (×3): 10 meq via INTRAVENOUS
  Filled 2011-12-05 (×2): qty 50

## 2011-12-05 MED ORDER — FAT EMULSION 20 % IV EMUL
250.0000 mL | INTRAVENOUS | Status: AC
  Administered 2011-12-05: 250 mL via INTRAVENOUS
  Filled 2011-12-05: qty 250

## 2011-12-05 MED ORDER — POTASSIUM CHLORIDE 10 MEQ/50ML IV SOLN
10.0000 meq | INTRAVENOUS | Status: AC
  Administered 2011-12-05 (×5): 10 meq via INTRAVENOUS
  Filled 2011-12-05 (×5): qty 50

## 2011-12-05 MED ORDER — INSULIN REGULAR HUMAN 100 UNIT/ML IJ SOLN: INTRAVENOUS | Status: DC

## 2011-12-05 MED ORDER — POTASSIUM CHLORIDE 10 MEQ/50ML IV SOLN
INTRAVENOUS | Status: AC
  Filled 2011-12-05: qty 50

## 2011-12-05 MED ORDER — DEXMEDETOMIDINE HCL IN NACL 400 MCG/100ML IV SOLN
0.4000 ug/kg/h | INTRAVENOUS | Status: DC
  Administered 2011-12-05: 0.4 ug/kg/h via INTRAVENOUS
  Filled 2011-12-05: qty 100

## 2011-12-05 MED ORDER — ACETAZOLAMIDE SODIUM 500 MG IJ SOLR
250.0000 mg | Freq: Three times a day (TID) | INTRAMUSCULAR | Status: AC
  Administered 2011-12-05 (×2): 250 mg via INTRAVENOUS
  Filled 2011-12-05 (×2): qty 500

## 2011-12-05 MED ORDER — METOPROLOL TARTRATE 1 MG/ML IV SOLN
5.0000 mg | Freq: Once | INTRAVENOUS | Status: AC
  Administered 2011-12-05: 5 mg via INTRAVENOUS
  Filled 2011-12-05: qty 5

## 2011-12-05 MED ORDER — ZINC TRACE METAL 1 MG/ML IV SOLN
INTRAVENOUS | Status: DC
  Filled 2011-12-05: qty 1000

## 2011-12-05 MED ORDER — POTASSIUM CHLORIDE 20 MEQ/15ML (10%) PO LIQD
40.0000 meq | Freq: Three times a day (TID) | ORAL | Status: AC
  Administered 2011-12-05: 40 meq
  Filled 2011-12-05: qty 30

## 2011-12-05 MED ORDER — FUROSEMIDE 10 MG/ML IJ SOLN
40.0000 mg | Freq: Four times a day (QID) | INTRAMUSCULAR | Status: AC
  Administered 2011-12-05 (×3): 40 mg via INTRAVENOUS
  Filled 2011-12-05 (×3): qty 4

## 2011-12-05 MED ORDER — SODIUM CHLORIDE 0.9 % IV BOLUS (SEPSIS)
500.0000 mL | Freq: Once | INTRAVENOUS | Status: AC
  Administered 2011-12-05: 500 mL via INTRAVENOUS

## 2011-12-05 MED ORDER — FENTANYL CITRATE 0.05 MG/ML IJ SOLN
25.0000 ug | INTRAMUSCULAR | Status: DC | PRN
  Administered 2011-12-05 – 2011-12-06 (×3): 50 ug via INTRAVENOUS
  Filled 2011-12-05: qty 2

## 2011-12-05 MED ORDER — MIDAZOLAM HCL 5 MG/ML IJ SOLN
2.0000 mg | INTRAMUSCULAR | Status: DC | PRN
  Administered 2011-12-05 (×2): 2 mg via INTRAVENOUS
  Administered 2011-12-05: 4 mg via INTRAVENOUS
  Administered 2011-12-05: 2 mg via INTRAVENOUS
  Administered 2011-12-06 (×6): 4 mg via INTRAVENOUS
  Filled 2011-12-05 (×10): qty 1

## 2011-12-05 NOTE — Progress Notes (Signed)
Patient ID: [name], female   DOB: 1931/08/16, 78 y.o.   MRN: [id number] 13 Days Post-Op  Subjective: Pt trying to communicate with me on a piece of paper, unable to tell what she wrote.  Family meeting yesterday resulted in proceeding with a trach.  Objective: Vital signs in last 24 hours: Temp:  [97.2 F (36.2 C)-98.5 F (36.9 C)] 98.5 F (36.9 C) (09/13 0328) Pulse Rate:  [54-140] 80  (09/13 0700) Resp:  [12-20] 18  (09/13 0700) BP: (92-145)/(29-64) 117/46 mmHg (09/13 0700) SpO2:  [98 %-100 %] 100 % (09/13 0700) FiO2 (%):  [30 %] 30 % (09/13 0328) Weight:  [139 lb 1.8 oz (63.1 kg)] 139 lb 1.8 oz (63.1 kg) (09/13 0200) Last BM Date: 12/04/11  Intake/Output from previous day: 09/12 0701 - 09/13 0700 In: 2097.3 [I.V.:487.3; NG/GT:180; IV Piggyback:760; TPN:670] Out: 3725 [Urine:3525; Stool:200] Intake/Output this shift:    PE: Abd: soft, wound with some staples, but other parts open and clean.  Not much healing.  Ostomy is pink and viable.  Air in bag, but nothing else currently.  Lab Results:   Basename 12/05/11 0240 12/04/11 0510  WBC 14.9* 14.8*  HGB 9.7* 7.9*  HCT 29.1* 23.5*  PLT 286 302   BMET  Basename 12/05/11 0240 12/04/11 0510  NA 137 139  K 2.8* 2.8*  CL 100 98  CO2 34* 37*  GLUCOSE 84 112*  BUN 26* 28*  CREATININE 0.45* 0.42*  CALCIUM 7.1* 7.4*   PT/INR  Basename 12/05/11 0240  LABPROT 14.4  INR 1.10   CMP     Component Value Date/Time   NA 137 12/05/2011 0240   NA 134* 11/20/2011 1055   K 2.8* 12/05/2011 0240   K 2.9* 11/20/2011 1055   CL 100 12/05/2011 0240   CL 96* 11/20/2011 1055   CO2 34* 12/05/2011 0240   CO2 26 11/20/2011 1055   GLUCOSE 84 12/05/2011 0240   GLUCOSE 167* 11/20/2011 1055   BUN 26* 12/05/2011 0240   BUN 11.0 11/20/2011 1055   CREATININE 0.45* 12/05/2011 0240   CREATININE 0.8 11/20/2011 1055   CALCIUM 7.1* 12/05/2011 0240   PROT 4.0* 12/05/2011 0240   PROT 5.4* 11/20/2011 1055   ALBUMIN 1.5* 12/05/2011 0240   ALBUMIN  2.8* 11/20/2011 1055   AST 18 12/05/2011 0240   AST 13 11/20/2011 1055   ALT 12 12/05/2011 0240   ALT 14 11/20/2011 1055   ALKPHOS 183* 12/05/2011 0240   ALKPHOS 97 11/20/2011 1055   BILITOT 0.3 12/05/2011 0240   BILITOT 0.90 11/20/2011 1055   GFRNONAA >90 12/05/2011 0240   GFRAA >90 12/05/2011 0240   Lipase     Component Value Date/Time   LIPASE 9* 11/21/2011 1909       Studies/Results: Dg Chest Port 1 View  12/05/2011  *RADIOLOGY REPORT*  Clinical Data: Evaluate endotracheal tube position  PORTABLE CHEST - 1 VIEW  Comparison: Portable chest x-ray of 12/04/2011  Findings: The tip of the endotracheal tube appears to be only 3 mm above the carina and could be withdrawn 2-3 cm.  There is little change in basilar opacities most consistent with effusions and atelectasis.  Heart size is stable. Left central venous line and right Port-A-Cath remain.  IMPRESSION: Tip of endotracheal tube at carina.  Recommend withdrawing 2-3 cm.   Original Report Authenticated By: Juline Patch, M.D.    Dg Chest Port 1 View  12/04/2011  *RADIOLOGY REPORT*  Clinical Data: Evaluate endotracheal tube position.  PORTABLE CHEST - 1 VIEW  Comparison: Chest x-ray of 12/03/2011.  Findings: An endotracheal tube is in place with tip 6.6 cm above the carina. There is a left-sided subclavian central venous catheter with tip terminating in the the right atrium. A nasogastric tube is seen extending into the stomach, however, the tip of the nasogastric tube extends below the lower margin of the image.  Right internal jugular single lumen Port-A-Cath with tip terminating in the superior cavoatrial junction.  Lung volumes are low with unchanged bibasilar opacities which may represent areas of atelectasis and/or consolidation, with superimposed small-moderate bilateral pleural effusions.  Pulmonary venous congestion without frank pulmonary edema.  Heart size is upper limits of normal. Mediastinal contours are unremarkable.  IMPRESSION: 1.   Support apparatus, as above. Endotracheal tube now appears appropriately positioned. 2.  Otherwise, the radiographic appearance of chest is unchanged, as above.   Original Report Authenticated By: Florencia Reasons, M.D.     Anti-infectives: Anti-infectives     Start     Dose/Rate Route Frequency Ordered Stop   12/01/11 2200   vancomycin (VANCOCIN) 500 mg in sodium chloride 0.9 % 100 mL IVPB  Status:  Discontinued        500 mg 100 mL/hr over 60 Minutes Intravenous Every 12 hours 12/01/11 1035 12/03/11 1159   12/01/11 1200   vancomycin (VANCOCIN) 1,250 mg in sodium chloride 0.9 % 250 mL IVPB        1,250 mg 166.7 mL/hr over 90 Minutes Intravenous  Once 12/01/11 1035 12/01/11 1305   11/29/11 1000   aztreonam (AZACTAM) 2 g in dextrose 5 % 50 mL IVPB        2 g 100 mL/hr over 30 Minutes Intravenous Every 8 hours 11/29/11 0907     11/28/11 1200   aztreonam (AZACTAM) 2 g in dextrose 5 % 50 mL IVPB  Status:  Discontinued        2 g 100 mL/hr over 30 Minutes Intravenous Every 12 hours 11/28/11 0240 11/28/11 0903   11/28/11 1000   fluconazole (DIFLUCAN) IVPB 200 mg  Status:  Discontinued        200 mg 100 mL/hr over 60 Minutes Intravenous Every 24 hours 11/28/11 0915 12/03/11 1159   11/28/11 0200   micafungin (MYCAMINE) 100 mg in sodium chloride 0.9 % 100 mL IVPB  Status:  Discontinued     Comments: Pharmacy to verify the dose      100 mg 100 mL/hr over 1 Hours Intravenous Daily at bedtime 11/28/11 0133 11/28/11 0903   11/28/11 0130   aztreonam (AZACTAM) 2 g in dextrose 5 % 50 mL IVPB        2 g 100 mL/hr over 30 Minutes Intravenous  Once 11/28/11 0130 11/28/11 0230   11/23/11 0300   fluconazole (DIFLUCAN) IVPB 200 mg  Status:  Discontinued        200 mg 100 mL/hr over 60 Minutes Intravenous Every 24 hours 11/22/11 0254 11/26/11 1442   11/22/11 0300   fluconazole (DIFLUCAN) IVPB 400 mg        400 mg 200 mL/hr over 60 Minutes Intravenous  Once 11/22/11 0254 11/22/11 0518   11/22/11  0230   metroNIDAZOLE (FLAGYL) IVPB 500 mg  Status:  Discontinued        500 mg 100 mL/hr over 60 Minutes Intravenous  Once 11/22/11 0229 11/22/11 0230   11/21/11 2230   ciprofloxacin (CIPRO) IVPB 400 mg  Status:  Discontinued  400 mg 200 mL/hr over 60 Minutes Intravenous 2 times daily 11/21/11 2207 11/29/11 0840   11/21/11 2230   metroNIDAZOLE (FLAGYL) IVPB 500 mg        500 mg 100 mL/hr over 60 Minutes Intravenous 3 times per day 11/21/11 2207     11/21/11 2100   meropenem (MERREM) 1 g in sodium chloride 0.9 % 100 mL IVPB  Status:  Discontinued        1 g 200 mL/hr over 30 Minutes Intravenous 3 times per day 11/21/11 2013 11/21/11 2146           Assessment/Plan  1. S/p Hartman's for perf tics 2. VDRF, getting trach today 3. A. Fib 4. Septic shock 5. PNA 6. PCM/TF 7. Hypokalemia  Plan: 1. Pt is getting trach today.  After that procedure can resume TFs.  Will replace OG to an NGT after trach placed.  Will advance 10cc q 12h to a goal of 40cc/hr as the patient is tolerating 20cc without residual.  I have asked pharmacy to wean TNA as appropriate. 2. Cont dressing changes to abdominal wound 3. Cont abx therapy 4. May need to consider PEG at some point for feeding for disposition purposes, will need to discuss this with the family at some time in the future, not urgent. 5. Appreciate CCM assistance  6. K being replaced with 6 runs today per CCM after lasix  LOS: 14 days    Javonne Dorko E 12/05/2011, 7:56 AM Pager: [phone]

## 2011-12-05 NOTE — Progress Notes (Signed)
Name: [name] MRN: [id number] DOB: 1931/04/15    LOS: 14  Referring Provider:  Dr. Gerrit Friends  Reason for Referral:  Septic shock  PULMONARY / CRITICAL CARE MEDICINE  HPI:  78 yo female admitted 11/21/2011 following several days of abdominal pain.  Found to have perforated sigmoid diverticulum with peritonitis, and pelvic abscess.  Had laparotomy 8/31.  Developed respiratory failure and shock post-op.  PCCM consulted 8/31.  She has hx of B cell lymphoma currently on chemo as outpt.  Events Since Admission: 8/31 Laparotomy, sigmoid colectomy and ostomy 9/01 A fib with RVR 9/02 Off pressors 9/06 A fib RVR>>back to sinus; added precedex for agitation; hypotensive.  Rt thoracentesis (transudate). 9/9 Reintubated.  Subjective:  Sedated and intubated, respiratory alkalosis noted via ABG overnight.  Somewhat resolved one hour later following vent changes.  Vital Signs: Temp:  [97.6 F (36.4 C)-98.5 F (36.9 C)] 98.5 F (36.9 C) (09/13 0328) Pulse Rate:  [55-140] 80  (09/13 0700) Resp:  [12-20] 18  (09/13 0700) BP: (92-145)/(29-64) 117/46 mmHg (09/13 0700) SpO2:  [98 %-100 %] 100 % (09/13 0700) FiO2 (%):  [30 %] 30 % (09/13 0804) Weight:  [63.1 kg (139 lb 1.8 oz)] 63.1 kg (139 lb 1.8 oz) (09/13 0200)  Intake/Output Summary (Last 24 hours) at 12/05/11 0856 Last data filed at 12/05/11 0600  Gross per 24 hour  Intake 1848.26 ml  Output   3725 ml  Net -1876.74 ml   Physical Examination: General: Restless, appears in mild distress Neuro: Somnolent, moves extremities, follows commands inconsistently HEENT: Intubated, mucus membranes intact but dry. Neck:  Supple Cardiovascular: s1s2 regular, no murmur Lungs: tachypnea, clear but decreased breath sounds Abdomen:  Wound dressings clean. Stoma red small amount of stool noted.  Hypoactive bowel sounds Musculoskeletal:  No edema, moves ext to command Skin:  Midline incision and Left sided colostomy placement, other wise intact. No  rashes.    ASSESSMENT AND PLAN  PULMONARY  Lab 12/05/11 0420 12/04/11 0455 12/03/11 0630 12/03/11 0454 12/02/11 0412  PHART 7.509* 7.583* 7.581* 7.614* 7.496*  PCO2ART 39.0 36.0 34.7* 32.2* 44.0  PO2ART 114.0* 113.0* 125.0* 140.0* 92.3  HCO3 30.8* 34.1* 32.8* 32.9* 33.4*  O2SAT 99.5 99.5 99.6 99.3 97.5   CXR:  9/11 Decreased lung volumes with bibasilar atelectasis and small bilateral pleural effusions (left greater than right).  Pulmonary venous congestion  Ventilator Settings: PCV RR 12, PS10, PEEP5, FiO2 of 30%.  ETT:  8/31 >> 9/8 >>> 9/9 >>>  A: 1) Acute respiratory failure in setting of septic shock from peritonitis.  Mental status barrier to extubation. Extubated on 9/8. Patient respirations 40 per minutes on BiPAP. CXR w/ progressive bilateral airspace disease. Likely pulmonary edema and PNA. Restlessness and tachypnea despite precedex infusion. Agitation and anxiety are her major barriers at this point.      2) Respiratory Alkalosis due to pt agitation  P:   Trach today. Continue diureses and electrolytes replacement. PS trials post trach. PCV for asynchrony. F/u CXR in am.  CARDIOVASCULAR  Lab 12/05/11 0240 11/28/11 1150  TROPONINI <0.30 <0.30  LATICACIDVEN 1.2 --  PROBNP -- --   Lines:  [place]t [place] CVL 8/31 >>   Echo 9/01>>periapical akinesis, EF 25 to 30%, diffuse hypokinesis, grade 1 diastolic dysfx, mod AR, mild MR, mod TR, mild/mod RV systolic dysfx, PAS 37 mmHg  A:  1) Septic shock 2nd to peritonitis>>resolved, now HTN. 2) A fib with RVR>>back in sinus 9/02 and remained. 3) Acute systolic CHF>>?if this is  sepsis related cardiomyopathy. 4) Hypotension 9/06 more likely related to diuresis, sedation and lopressor use rather than recurrent sepsis (she is negative 5 liters since 9/03). Once again in pulmonary edema.   P:  Diurese as in renal section. Change to Lopressor 12.5 mg PO BID.  RENAL  Lab 12/05/11 0240 12/04/11 0510 12/03/11 1700 12/03/11 0440  12/02/11 0500 12/01/11 0408  NA 137 139 137 139 141 --  K 2.8* 2.8* -- -- -- --  CL 100 98 101 98 102 --  CO2 34* 37* 35* 36* 34* --  BUN 26* 28* 27* 31* 27* --  CREATININE 0.45* 0.42* 0.40* 0.46* 0.50 --  CALCIUM 7.1* 7.4* 7.3* 7.5* 7.3* --  MG 1.8 1.8 2.1 1.9 1.7 --  PHOS 2.8 3.0 -- 3.6 2.6 3.7   Intake/Output      09/12 0701 - 09/13 0700 09/13 0701 - 09/14 0700   I.V. (mL/kg) 487.3 (7.7)    Other     NG/GT 180    IV Piggyback 760    TPN 670    Total Intake(mL/kg) 2097.3 (33.2)    Urine (mL/kg/hr) 3525 (2.3)    Stool 200    Total Output 3725    Net -1627.7          Foley:  Placed 11/21/2011  A: Hypokalemia, hypomagnesemia P:   Replace magnesium and potassium and and send follow up BMP Monitor renal fx, urine outpt Lasix as ordered. Diamox for lasix induced alkalosis.  GASTROINTESTINAL  Lab 12/05/11 0240 12/04/11 0510 12/01/11 0408 11/28/11 1715  AST 18 15 20  --  ALT 12 10 12  --  ALKPHOS 183* 133* 148* --  BILITOT 0.3 0.3 0.4 --  PROT 4.0* 3.9* 4.4* 3.7*  ALBUMIN 1.5* 1.5* 1.8* --   A:  Perforated sigmoid diverticulum s/p laparotomy>>CT abd/pelvis 9/06 not impressive for abscess, but very vague read. Surgery is following.   P:   Post-op care per CCS. TF started 9/08 per CCS, post trach will restart at 20 and increase by 10 ml/hr q12 hours for a goal of 40 ml/hr. Possible repeat CT scan next couple of days, defer to CCS given pelvic fluid collection but will defer to surgery.  HEMATOLOGIC  Lab 12/05/11 0240 12/04/11 0510 12/03/11 0440 12/02/11 0500 12/01/11 0408  HGB 9.7* 7.9* 7.7* 7.9* 8.8*  HCT 29.1* 23.5* 22.8* 22.6* 26.4*  PLT 286 302 258 239 305  INR 1.10 -- -- -- --  APTT 25 -- -- -- --   A:  1)  Follicular B cell lymphoma, currently undergoing chemotherapy. 2)  Anemia of critical illness, and chronic disease. 3)  Thrombocytopenia likely related to sepsis/critical illness>>improved. P:  F/u CBC Transfuse for Hb < 7 LFTs in AM for ?albumen and  liver function  INFECTIOUS  Lab 12/05/11 0240 12/04/11 0510 12/03/11 0440 12/02/11 0500 12/01/11 0408  WBC 14.9* 14.8* 18.6* 27.5* 49.4*  PROCALCITON -- -- -- -- --   Cultures: Blood 8/31 >>negative Urine 8/31 >>negative Sputum 9/05 >>Oral flora Rt pleural fluid 9/06>>neg Urine 9/06: E coli (pansens) C diff 9/06>>negative Blood 9/06>> no growth Urine culture 9/11>>  Antibiotics: Cipro 8/31 >>9/06 Flagyl 8/31 (peritonitis)>> Fluconazole 8/31(peritonitis) >>9/13 Aztreonam 9/07>> Vancomycin 9/9>>9/11  A:   1) Peritonitis 2nd to sigmoid perforation>>improved. 2) Possible HCAP (no organisms specified)  3) leukocytosis>>she is getting solu cortef, WBCs trending down  GNR in urine cx 9/06. Has had same foley since 8/30 Hx of PCN allergy.  P:   D14/15 flagyl, will d/c in  AM. D7/8 aztreonam. D/ced vanc-->no MRSA identified. Completed a 14 day course of fluconazold. F/u culture results and adjust abx as appropriate Change foley catheter, repeat UA and cultures. Placed PICC and d/ced TLC.  ENDOCRINE  Lab 12/05/11 0410 12/04/11 2350 12/04/11 1914 12/04/11 1618 12/04/11 1115  GLUCAP 78 134* 110* 121* 136*   A:  Hyperglycemia. P:  Blood sugar monitoring Q4H and SSI  NEUROLOGIC  A:  Anxiety/ acute encephalopathy  most likely ventilator related.  Currently this is inhibiting possible extubation. P:   Added low dose Seroquel now that we can use her gut    Will trach today, LTAC placement consult in motion, PEG will be deferred to surgery (family would like to see if she can get off the vent first prior to deciding on PEG per 9/12 discussion), continue diureses.  CC time 35 min.  Alyson Reedy, M.D. [facility] Pulmonary/Critical Care Medicine. Pager: [phone]. After hours pager: [phone].

## 2011-12-05 NOTE — Progress Notes (Signed)
eLink Physician-Brief Progress Note Patient Name: ARADHANA GIN DOB: 03-Jul-1931 MRN: [id number]  Date of Service  12/05/2011   HPI/Events of Note  ecg reviewed, biphasic lateral T, ST depression maybe hint lateral  eICU Interventions  Send enzymes, metoprolol x 1, lactic, re assess lytes had k 2.9 last done   Intervention Category Major Interventions: Arrhythmia - evaluation and management  Aurore Redinger J. 12/05/2011, 1:52 AM

## 2011-12-05 NOTE — Progress Notes (Signed)
Ok per Dr Tyson Alias to attempt abg on left (same side as PICC).

## 2011-12-05 NOTE — Progress Notes (Signed)
eLink Physician-Brief Progress Note Patient Name: [name] DOB: [birth year] MRN: [id number]  Date of Service  12/05/2011   HPI/Events of Note  Appears agitated as main issue, delirium, rebounding off precedex? component dry   eICU Interventions  Bolus again, re add precedex, dc fent drip   Intervention Category Major Interventions: Arrhythmia - evaluation and management;Change in mental status - evaluation and management  FEINSTEIN,DANIEL J. 12/05/2011, 1:31 AM

## 2011-12-05 NOTE — Progress Notes (Signed)
PARENTERAL NUTRITION CONSULT NOTE   Pharmacy Consult for TPN Indication: Prolonged ileus  Allergies  Allergen Reactions  . Codeine Hives  . Other Nausea And Vomiting    anesthesia  . Penicillins Hives  . Sulfa Antibiotics Hives   Patient Measurements: Height: 4\' 11"  (149.9 cm) (4.11) Weight: 139 lb 1.8 oz (63.1 kg) IBW/kg (Calculated) : 43.2  Adjusted Body Weight: 50.4  Vital Signs: Temp: 98.5 F (36.9 C) (09/13 0328) Temp src: Oral (09/13 0328) BP: 117/46 mmHg (09/13 0700) Pulse Rate: 80  (09/13 0700) Intake/Output from previous day: 09/12 0701 - 09/13 0700 In: 2097.3 [I.V.:487.3; NG/GT:180; IV Piggyback:760; TPN:670] Out: 3725 [Urine:3525; Stool:200]  Labs:  [facility] 12/05/11 0240 12/04/11 0510 12/03/11 0440  WBC 14.9* 14.8* 18.6*  HGB 9.7* 7.9* 7.7*  HCT 29.1* 23.5* 22.8*  PLT 286 302 258  APTT 25 -- --  INR 1.10 -- --    Basename 12/05/11 0240 12/04/11 0510 12/03/11 1700 12/03/11 0440  NA 137 139 137 --  K 2.8* 2.8* 3.9 --  CL 100 98 101 --  CO2 34* 37* 35* --  GLUCOSE 84 112* 138* --  BUN 26* 28* 27* --  CREATININE 0.45* 0.42* 0.40* --  LABCREA -- -- -- --  CREAT24HRUR -- -- -- --  CALCIUM 7.1* 7.4* 7.3* --  MG 1.8 1.8 2.1 --  PHOS 2.8 3.0 -- 3.6  PROT 4.0* 3.9* -- --  ALBUMIN 1.5* 1.5* -- --  AST 18 15 -- --  ALT 12 10 -- --  ALKPHOS 183* 133* -- --  BILITOT 0.3 0.3 -- --  BILIDIR <0.1 -- -- --  IBILI NOT CALCULATED -- -- --  PREALBUMIN -- -- -- --  TRIG -- -- -- --  CHOLHDL -- -- -- --  CHOL -- -- -- --   Estimated Creatinine Clearance: 47.6 ml/min (by C-G formula based on Cr of 0.45).    Basename 12/05/11 0410 12/04/11 2350 12/04/11 1914  GLUCAP 78 134* 110*   CBGs & Insulin requirements past 24 hours:   CBGs = 110-136  Resistant SSI Q4h: 10 units given  Regular insulin in TNA HYQ:MVHQION bag - 15 units/L Clinimix  Nutritional Goals:   RD recs 12/02/11: re-estimated needs 1118 kcal, 80-90 gm protein  Current nutrition:    Diet: NPO  Tube feeds: Vital AF 1.2 at 20 ml/hr (provides 36g protein and 576 kcal)  Clinimix E 5/15 at 30 ml/hr (+ Lipids MWF) + Tube Feeds provides: 72 g/day protein and an average of 1292 Kcal/day (1568 Kcal/day MWF, 1088 Kcal/day STTHS).  [facility]: [facility] at [facility]  Assessment:   78 yo F admit on 8/30 w/ ruptured sigmoid diverticulum, peritonitis, and pelvic abscess. S/p laparotomy 8/31.  TNA started 9/4 for prolonged ileus.  B-Cell Lymphoma currently receiving chemotherapy.   Surgery plans to resume TF after trach today and advance TF to goal rate in next 24 hr, TNA already at low rate.    CBGs: decreased with TF held.  Renal/hepatic function:  WNL  Electrolytes: K=2.8 (MD has already ordered replacement of 6 runs). Corrected Ca WNL. HCO3 elevated. Other lytes ok.  LFT's: Alk Phos elevated, others wnl  Pre-Albumin: 11.7 (9/4), 22.6 (9/9)  TG/Cholesterol: wnl 9/8  GI prophylaxis:  pantoprazole  Plan:  At 1800 tonight Continue Clinimix E 5/15 at 40 ml/hr, plan to d/c TNA tomorrow No insulin in TNA.  Continue current resistant SSI. TNA to contain fat emulsion, standard multivitamins and trace elements MWF only due to ongoing shortage  Continue IVF at [facility] K+ replacement per MD TNA lab panels on Mondays & Thursdays. F/u daily.  Labs ordered for AM.  Otho Bellows , PharmD,  Pager: [phone] 12/05/2011 8:24 AM

## 2011-12-05 NOTE — Consult Note (Signed)
WOC ostomy follow-up consult:  Stoma type/location: Left lower quad colostomy. Stomal assessment/size: Stoma 1 5/8 inches, red and viable, above skin level. Peristomal assessment:  Skin intact surrounding stoma.  Output  No stool or flatus at present, scant amt brown liquid. Ostomy pouching: 1pc flexible pouch applied. Education provided:  Pt was recently emergently trached and is not very responsive at this time.  No family members present in room for pouching demonstration.  Applied one piece pouch and left educational materials at bedside.  Placed on [place] discharge program.  WOC team can follow when stable and out of ICU for further teaching sessions.  Cammie Mcgee, RN, MSN, [employer]  [phone]

## 2011-12-05 NOTE — Procedures (Signed)
Tracheostomy Placement  Consent from patient and family, patient sedated and paralyzed, area cleaned, lidocaine injected, skin incision placed followed by blunt dissection, ET tube pulled back and airway entered, wire placed and visualized bronchoscopically, trachea crushed and dilated then tracheostomy placed.  Visualized bronchoscopically from above and below.  3 cm above carina.  CXR ordered and pending.  One CC blood loss.  Patient tolerated the procedure well without complications.  Alyson Reedy, M.D. [facility] Pulmonary/Critical Care Medicine. Pager: [phone]. After hours pager: [phone].

## 2011-12-05 NOTE — Progress Notes (Signed)
eLink Physician-Brief Progress Note Patient Name: [name] DOB: 22-Jul-1931 MRN: [id number]  Date of Service  12/05/2011   HPI/Events of Note   Tacy, on lasix  eICU Interventions  fent was bolused  Bolus fluid   Intervention Category Major Interventions: Arrhythmia - evaluation and management  [name] 12/05/2011, 12:46 AM

## 2011-12-05 NOTE — Clinical Social Work Note (Signed)
CSW met with Pt and her husband, Chrissie Noa to offer support. Husband coping well and continues to be hopeful for Pt to improve. He is aware of plan for LTAC after trach. CSW explained that sometimes Pts need further rehab after LTAC. He stated understanding.  CSW will check in on family if Pt is still her on Monday.   Doreen Salvage, LCSWA ICU/Stepdown Clinical Social Worker [facility] Cell [phone] Hours 8am-1200pm M-F

## 2011-12-05 NOTE — Progress Notes (Signed)
Agree with above, appreciate ccm assistance 

## 2011-12-05 NOTE — Procedures (Signed)
Bronchoscopy Procedure Note ASHRITHA DESROSIERS [id number] [birth year]  Procedure: Bronchoscopy Indications: tracheostomy placement  Procedure Details Consent: Risks of procedure as well as the alternatives and risks of each were explained to the (patient/caregiver).  Consent for procedure obtained. Time Out: Verified patient identification, verified procedure, site/side was marked, verified correct patient position, special equipment/implants available, medications/allergies/relevent history reviewed, required imaging and test results available.  Performed  In preparation for procedure, patient was given 100% FiO2 and bronchoscope lubricated. Sedation: Benzodiazepines and Muscle relaxants  Airway entered and the following bronchi were examined: RUL, RML, RLL, LUL, LLL and Bronchi.   Procedures performed:  Airway maintained while placing tracheostomy. Needle observed w/ guidewire placement within the airway verified, Minimal heme noted. Post trach placement Bronch placed in #6 trach. Placement verified. No sig blood. Pt tol well.  Bronchoscope removed.    Evaluation Hemodynamic Status: BP stable throughout; O2 sats: stable throughout Patient's Current Condition: stable Specimens:  None Complications: No apparent complications Patient did tolerate procedure well.   Faylinn Schwenn,PETE 12/05/2011

## 2011-12-05 NOTE — Procedures (Signed)
Bedside Tracheostomy Insertion Procedure Note    Patient Details:   Name: [name] DOB: 06/09/1931 MRN: [id number]  Procedure: Tracheostomy  Pre Procedure Assessment: ET Tube Size: 7.5 ET Tube secured at lip (cm): 21 Bite block in place: Yes Breath Sounds: Clear  Post Procedure Assessment: BP 117/45  Pulse 80  Temp 99 F (37.2 C) (Axillary)  Resp 16  Ht 4\' 11"  (1.499 m)  Wt 139 lb 1.8 oz (63.1 kg)  BMI 28.10 kg/m2  SpO2 100% O2 sats: stable throughout Complications: No apparent complications Patient did tolerate procedure well Tracheostomy Brand:Shiley Tracheostomy Style:Cuffed Tracheostomy Size: 6.0 Tracheostomy Secured FAO:[email or web address] Tracheostomy Placement Confirmation:Trach cuff visualized and in place    Jennette Kettle 12/05/2011, 12:37 PM

## 2011-12-05 NOTE — Progress Notes (Signed)
Pt was found to have OGT pulled partially out, with hands near face. Pt had orders for NPO after midnight related to a planned tracheostomy on 12/05/11. RN removed OGT & mouth was suctioned. Pt tolerated well. Pt also appeared very restless, anxious, agitated, with HR 130s-150s. MD called, orders for 500cc bolus received. Fentanyl boluses administered, with pt remaining uncomfortable and anxious. MD called, orders received, 500cc bolus given. Will continue to monitor RASS and for delirium.

## 2011-12-06 ENCOUNTER — Inpatient Hospital Stay (HOSPITAL_COMMUNITY): Payer: Medicare Other

## 2011-12-06 ENCOUNTER — Ambulatory Visit: Payer: Medicare Other

## 2011-12-06 DIAGNOSIS — Z930 Tracheostomy status: Secondary | ICD-10-CM

## 2011-12-06 LAB — GLUCOSE, CAPILLARY
Glucose-Capillary: 125 mg/dL — ABNORMAL HIGH (ref 70–99)
Glucose-Capillary: 103 mg/dL — ABNORMAL HIGH (ref 70–99)
Glucose-Capillary: 131 mg/dL — ABNORMAL HIGH (ref 70–99)

## 2011-12-06 LAB — CBC
Hemoglobin: 8.3 g/dL — ABNORMAL LOW (ref 12.0–15.0)
MCH: 30.3 pg (ref 26.0–34.0)
MCHC: 32.9 g/dL (ref 30.0–36.0)
MCV: 92 fL (ref 78.0–100.0)
RBC: 2.74 MIL/uL — ABNORMAL LOW (ref 3.87–5.11)

## 2011-12-06 LAB — BASIC METABOLIC PANEL
BUN: 30 mg/dL — ABNORMAL HIGH (ref 6–23)
CO2: 29 mEq/L (ref 19–32)
CO2: 27 mEq/L (ref 19–32)
Calcium: 7.7 mg/dL — ABNORMAL LOW (ref 8.4–10.5)
Creatinine, Ser: 0.4 mg/dL — ABNORMAL LOW (ref 0.50–1.10)
GFR calc non Af Amer: 89 mL/min — ABNORMAL LOW (ref >90)
Glucose, Bld: 115 mg/dL — ABNORMAL HIGH (ref 70–99)
Glucose, Bld: 154 mg/dL — ABNORMAL HIGH (ref 70–99)
Potassium: 2.5 mEq/L — CL (ref 3.5–5.1)
Sodium: 139 mEq/L (ref 135–145)

## 2011-12-06 LAB — BLOOD GAS, ARTERIAL
Acid-Base Excess: 3.9 mmol/L — ABNORMAL HIGH (ref 0.0–2.0)
Drawn by: 232811
FIO2: 0.3 %
MECHVT: 400 mL
Patient temperature: 98.8
RATE: 18 resp/min
TCO2: 25 mmol/L (ref 0–100)
pCO2 arterial: 33.9 mmHg — ABNORMAL LOW (ref 35.0–45.0)

## 2011-12-06 LAB — MAGNESIUM: Magnesium: 2.1 mg/dL (ref 1.5–2.5)

## 2011-12-06 MED ORDER — POTASSIUM CHLORIDE 10 MEQ/50ML IV SOLN
10.0000 meq | INTRAVENOUS | Status: DC
  Administered 2011-12-06 (×3): 10 meq via INTRAVENOUS
  Filled 2011-12-06 (×4): qty 50

## 2011-12-06 MED ORDER — CLINIMIX E/DEXTROSE (5/15) 5 % IV SOLN
INTRAVENOUS | Status: AC
  Administered 2011-12-06: 17:00:00 via INTRAVENOUS
  Filled 2011-12-06: qty 2000

## 2011-12-06 MED ORDER — IOHEXOL 300 MG/ML  SOLN
100.0000 mL | Freq: Once | INTRAMUSCULAR | Status: AC | PRN
  Administered 2011-12-06: 100 mL via INTRAVENOUS

## 2011-12-06 MED ORDER — POTASSIUM CHLORIDE 10 MEQ/50ML IV SOLN
10.0000 meq | INTRAVENOUS | Status: AC
  Administered 2011-12-06 (×6): 10 meq via INTRAVENOUS
  Filled 2011-12-06 (×5): qty 50

## 2011-12-06 MED ORDER — POTASSIUM CHLORIDE 10 MEQ/50ML IV SOLN
10.0000 meq | INTRAVENOUS | Status: AC
  Administered 2011-12-06 – 2011-12-07 (×4): 10 meq via INTRAVENOUS
  Filled 2011-12-06: qty 200

## 2011-12-06 NOTE — Progress Notes (Signed)
Patient ID: [name], female   DOB: [birth year], 78 y.o.   MRN: [id number] [facility] Progress Note:   14 Days Post-Op  Subjective: Mental status is difficult to assess as she is trached, tries to communicate with her hands Objective: Vital signs in last 24 hours: Temp:  [98.3 F (36.8 C)-99.8 F (37.7 C)] 99.8 F (37.7 C) (09/14 0800) Pulse Rate:  [52-117] 96  (09/14 0900) Resp:  [15-23] 19  (09/14 0900) BP: (80-146)/(32-87) 126/87 mmHg (09/14 0900) SpO2:  [98 %-100 %] 100 % (09/14 0900) FiO2 (%):  [30 %-100 %] 35 % (09/14 0857) Weight:  [129 lb 13.6 oz (58.9 kg)] 129 lb 13.6 oz (58.9 kg) (09/14 0400)  Intake/Output from previous day: 09/13 0701 - 09/14 0700 In: 2170.5 [I.V.:369; IV Piggyback:868; TPN:933.5] Out: 5350 [Urine:5320; Stool:30] Intake/Output this shift:    Physical Exam: Work of breathing is  On vent support.  Will change dressing on abdomen QD (instead of BID) Awaiting CT results to assess "free air".  Clinically abdomen exam is not that remarkable  Lab Results:  Results for orders placed during the hospital encounter of 11/21/11 (from the past 48 hour(s))  GLUCOSE, CAPILLARY     Status: Abnormal   Collection Time   12/04/11 11:15 AM      Component Value Range Comment   Glucose-Capillary 136 (*) 70 - 99 mg/dL    Comment 1 Documented in Chart      Comment 2 Notify RN     GLUCOSE, CAPILLARY     Status: Abnormal   Collection Time   12/04/11  4:18 PM      Component Value Range Comment   Glucose-Capillary 121 (*) 70 - 99 mg/dL    Comment 1 Documented in Chart      Comment 2 Notify RN     GLUCOSE, CAPILLARY     Status: Abnormal   Collection Time   12/04/11  7:14 PM      Component Value Range Comment   Glucose-Capillary 110 (*) 70 - 99 mg/dL    Comment 1 Notify RN     GLUCOSE, CAPILLARY     Status: Abnormal   Collection Time   12/04/11 11:50 PM      Component Value Range Comment   Glucose-Capillary 134 (*) 70 - 99 mg/dL    Comment 1 Notify  RN     CBC     Status: Abnormal   Collection Time   12/05/11  2:40 AM      Component Value Range Comment   WBC 14.9 (*) 4.0 - 10.5 K/uL    RBC 3.21 (*) 3.87 - 5.11 MIL/uL    Hemoglobin 9.7 (*) 12.0 - 15.0 g/dL    HCT 40.9 (*) 81.1 - 46.0 %    MCV 90.7  78.0 - 100.0 fL    MCH 30.2  26.0 - 34.0 pg    MCHC 33.3  30.0 - 36.0 g/dL    RDW 91.4 (*) 78.2 - 15.5 %    Platelets 286  150 - 400 K/uL   HEPATIC FUNCTION PANEL     Status: Abnormal   Collection Time   12/05/11  2:40 AM      Component Value Range Comment   Total Protein 4.0 (*) 6.0 - 8.3 g/dL    Albumin 1.5 (*) 3.5 - 5.2 g/dL    AST 18  0 - 37 U/L    ALT 12  0 - 35 U/L    Alkaline Phosphatase  183 (*) 39 - 117 U/L    Total Bilirubin 0.3  0.3 - 1.2 mg/dL    Bilirubin, Direct <4.0  0.0 - 0.3 mg/dL    Indirect Bilirubin NOT CALCULATED  0.3 - 0.9 mg/dL   PROTIME-INR     Status: Normal   Collection Time   12/05/11  2:40 AM      Component Value Range Comment   Prothrombin Time 14.4  11.6 - 15.2 seconds    INR 1.10  0.00 - 1.49   APTT     Status: Normal   Collection Time   12/05/11  2:40 AM      Component Value Range Comment   aPTT 25  24 - 37 seconds   LACTIC ACID, PLASMA     Status: Normal   Collection Time   12/05/11  2:40 AM      Component Value Range Comment   Lactic Acid, Venous 1.2  0.5 - 2.2 mmol/L   TROPONIN I     Status: Normal   Collection Time   12/05/11  2:40 AM      Component Value Range Comment   Troponin I <0.30  <0.30 ng/mL   BASIC METABOLIC PANEL     Status: Abnormal   Collection Time   12/05/11  2:40 AM      Component Value Range Comment   Sodium 137  135 - 145 mEq/L    Potassium 2.8 (*) 3.5 - 5.1 mEq/L    Chloride 100  96 - 112 mEq/L    CO2 34 (*) 19 - 32 mEq/L    Glucose, Bld 84  70 - 99 mg/dL    BUN 26 (*) 6 - 23 mg/dL    Creatinine, Ser 9.81 (*) 0.50 - 1.10 mg/dL    Calcium 7.1 (*) 8.4 - 10.5 mg/dL    GFR calc non Af Amer >90  >90 mL/min    GFR calc Af Amer >90  >90 mL/min   PHOSPHORUS     Status:  Normal   Collection Time   12/05/11  2:40 AM      Component Value Range Comment   Phosphorus 2.8  2.3 - 4.6 mg/dL   MAGNESIUM     Status: Normal   Collection Time   12/05/11  2:40 AM      Component Value Range Comment   Magnesium 1.8  1.5 - 2.5 mg/dL   GLUCOSE, CAPILLARY     Status: Normal   Collection Time   12/05/11  4:10 AM      Component Value Range Comment   Glucose-Capillary 78  70 - 99 mg/dL    Comment 1 Notify RN     BLOOD GAS, ARTERIAL     Status: Abnormal   Collection Time   12/05/11  4:20 AM      Component Value Range Comment   FIO2 0.30      Delivery systems VENTILATOR      Mode PRESSURE CONTROL      Rate 12      Peep/cpap 5.0      Pressure control 15      pH, Arterial 7.509 (*) 7.350 - 7.450    pCO2 arterial 39.0  35.0 - 45.0 mmHg    pO2, Arterial 114.0 (*) 80.0 - 100.0 mmHg    Bicarbonate 30.8 (*) 20.0 - 24.0 mEq/L    TCO2 28.9  0 - 100 mmol/L    Acid-Base Excess 7.4 (*) 0.0 - 2.0 mmol/L    O2 Saturation  99.5      Patient temperature 98.5      Collection site LEFT RADIAL      Drawn by [id number]      Sample type ARTERIAL      Allens test (pass/fail) PASS  PASS   GLUCOSE, CAPILLARY     Status: Normal   Collection Time   12/05/11  7:43 AM      Component Value Range Comment   Glucose-Capillary 91  70 - 99 mg/dL    Comment 1 Documented in Chart      Comment 2 Notify RN     GLUCOSE, CAPILLARY     Status: Abnormal   Collection Time   12/05/11  1:23 PM      Component Value Range Comment   Glucose-Capillary 119 (*) 70 - 99 mg/dL   GLUCOSE, CAPILLARY     Status: Abnormal   Collection Time   12/05/11  4:35 PM      Component Value Range Comment   Glucose-Capillary 110 (*) 70 - 99 mg/dL    Comment 1 Documented in Chart      Comment 2 Notify RN     GLUCOSE, CAPILLARY     Status: Abnormal   Collection Time   12/05/11  7:35 PM      Component Value Range Comment   Glucose-Capillary 150 (*) 70 - 99 mg/dL    Comment 1 Documented in Chart      Comment 2 Notify RN       GLUCOSE, CAPILLARY     Status: Abnormal   Collection Time   12/05/11 11:25 PM      Component Value Range Comment   Glucose-Capillary 125 (*) 70 - 99 mg/dL    Comment 1 Documented in Chart      Comment 2 Notify RN     GLUCOSE, CAPILLARY     Status: Abnormal   Collection Time   12/06/11  3:10 AM      Component Value Range Comment   Glucose-Capillary 125 (*) 70 - 99 mg/dL    Comment 1 Documented in Chart      Comment 2 Notify RN     BASIC METABOLIC PANEL     Status: Abnormal   Collection Time   12/06/11  4:00 AM      Component Value Range Comment   Sodium 139  135 - 145 mEq/L    Potassium 2.5 (*) 3.5 - 5.1 mEq/L    Chloride 103  96 - 112 mEq/L    CO2 29  19 - 32 mEq/L    Glucose, Bld 115 (*) 70 - 99 mg/dL    BUN 30 (*) 6 - 23 mg/dL    Creatinine, Ser 9.14  0.50 - 1.10 mg/dL    Calcium 7.7 (*) 8.4 - 10.5 mg/dL    GFR calc non Af Amer 89 (*) >90 mL/min    GFR calc Af Amer >90  >90 mL/min   CBC     Status: Abnormal   Collection Time   12/06/11  4:00 AM      Component Value Range Comment   WBC 15.5 (*) 4.0 - 10.5 K/uL    RBC 2.74 (*) 3.87 - 5.11 MIL/uL    Hemoglobin 8.3 (*) 12.0 - 15.0 g/dL    HCT 78.2 (*) 95.6 - 46.0 %    MCV 92.0  78.0 - 100.0 fL    MCH 30.3  26.0 - 34.0 pg    MCHC 32.9  30.0 - 36.0 g/dL  RDW 18.5 (*) 11.5 - 15.5 %    Platelets 335  150 - 400 K/uL   MAGNESIUM     Status: Normal   Collection Time   12/06/11  4:00 AM      Component Value Range Comment   Magnesium 2.1  1.5 - 2.5 mg/dL   PHOSPHORUS     Status: Normal   Collection Time   12/06/11  4:00 AM      Component Value Range Comment   Phosphorus 2.9  2.3 - 4.6 mg/dL   BLOOD GAS, ARTERIAL     Status: Abnormal   Collection Time   12/06/11  4:08 AM      Component Value Range Comment   FIO2 0.30      Delivery systems VENTILATOR      Mode PRESSURE REGULATED VOLUME CONTROL      VT 400      Rate 18      Peep/cpap 5.0      pH, Arterial 7.508 (*) 7.350 - 7.450    pCO2 arterial 33.9 (*) 35.0 - 45.0 mmHg     pO2, Arterial 115.0 (*) 80.0 - 100.0 mmHg    Bicarbonate 26.7 (*) 20.0 - 24.0 mEq/L    TCO2 25.0  0 - 100 mmol/L    Acid-Base Excess 3.9 (*) 0.0 - 2.0 mmol/L    O2 Saturation 99.4      Patient temperature 98.8      Collection site LEFT RADIAL      Drawn by [id number]      Sample type ARTERIAL      Allens test (pass/fail) PASS  PASS   GLUCOSE, CAPILLARY     Status: Abnormal   Collection Time   12/06/11  8:21 AM      Component Value Range Comment   Glucose-Capillary 103 (*) 70 - 99 mg/dL    Comment 1 Documented in Chart      Comment 2 Notify RN       Radiology/Results: Dg Chest Port 1 View  12/06/2011  *RADIOLOGY REPORT*  Clinical Data: Respiratory difficulty  PORTABLE CHEST - 1 VIEW  Comparison: Yesterday  Findings: Tracheostomy, left hip, right subclavian Port-A-Cath stable.  Extensive subcutaneous gas has developed over the left chest wall.  Free air has developed in the right hemidiaphragm. Bilateral pleural effusions not significantly changed. No pneumothorax.  IMPRESSION: Interval development of significant subcutaneous emphysema over the left chest wall and free air in the abdomen under the right hemidiaphragm.  Findings may reflect barotrauma from ventilator support.  Ruptured bowel not excluded. Critical Value/emergent results were called by telephone at the time of interpretation on 12/06/2011 at 7:40 hours to [facility], RN, who verbally acknowledged these results.   Original Report Authenticated By: Donavan Burnet, M.D.    Dg Chest Port 1 View  12/05/2011  *RADIOLOGY REPORT*  Clinical Data: Tracheostomy placement  PORTABLE CHEST - 1 VIEW  Comparison: 12/05/2011  Findings: Tracheostomy appears appropriately positioned.  Left- sided PICC line tip over high right atrium.  Right-sided Port-A- Cath tip at the cavoatrial junction.  Minimal biapical pleural thickening.  No pneumothorax.  Trace pleural fluid is stable. Heart size is normal.  Nasogastric tube is appropriately positioned.   IMPRESSION: Small pleural effusions again noted.  Support apparatus as above.  No new abnormality.   Original Report Authenticated By: Harrel Lemon, M.D.    Dg Chest Port 1 View  12/05/2011  *RADIOLOGY REPORT*  Clinical Data: Evaluate endotracheal tube position  PORTABLE CHEST -  1 VIEW  Comparison: Portable chest x-ray of 12/04/2011  Findings: The tip of the endotracheal tube appears to be only 3 mm above the carina and could be withdrawn 2-3 cm.  There is little change in basilar opacities most consistent with effusions and atelectasis.  Heart size is stable. Left central venous line and right Port-A-Cath remain.  IMPRESSION: Tip of endotracheal tube at carina.  Recommend withdrawing 2-3 cm.   Original Report Authenticated By: Juline Patch, M.D.     Anti-infectives: Anti-infectives     Start     Dose/Rate Route Frequency Ordered Stop   12/01/11 2200   vancomycin (VANCOCIN) 500 mg in sodium chloride 0.9 % 100 mL IVPB  Status:  Discontinued        500 mg 100 mL/hr over 60 Minutes Intravenous Every 12 hours 12/01/11 1035 12/03/11 1159   12/01/11 1200   vancomycin (VANCOCIN) 1,250 mg in sodium chloride 0.9 % 250 mL IVPB        1,250 mg 166.7 mL/hr over 90 Minutes Intravenous  Once 12/01/11 1035 12/01/11 1305   11/29/11 1000   aztreonam (AZACTAM) 2 g in dextrose 5 % 50 mL IVPB        2 g 100 mL/hr over 30 Minutes Intravenous Every 8 hours 11/29/11 0907     11/28/11 1200   aztreonam (AZACTAM) 2 g in dextrose 5 % 50 mL IVPB  Status:  Discontinued        2 g 100 mL/hr over 30 Minutes Intravenous Every 12 hours 11/28/11 0240 11/28/11 0903   11/28/11 1000   fluconazole (DIFLUCAN) IVPB 200 mg  Status:  Discontinued        200 mg 100 mL/hr over 60 Minutes Intravenous Every 24 hours 11/28/11 0915 12/03/11 1159   11/28/11 0200   micafungin (MYCAMINE) 100 mg in sodium chloride 0.9 % 100 mL IVPB  Status:  Discontinued     Comments: Pharmacy to verify the dose      100 mg 100 mL/hr over 1  Hours Intravenous Daily at bedtime 11/28/11 0133 11/28/11 0903   11/28/11 0130   aztreonam (AZACTAM) 2 g in dextrose 5 % 50 mL IVPB        2 g 100 mL/hr over 30 Minutes Intravenous  Once 11/28/11 0130 11/28/11 0230   11/23/11 0300   fluconazole (DIFLUCAN) IVPB 200 mg  Status:  Discontinued        200 mg 100 mL/hr over 60 Minutes Intravenous Every 24 hours 11/22/11 0254 11/26/11 1442   11/22/11 0300   fluconazole (DIFLUCAN) IVPB 400 mg        400 mg 200 mL/hr over 60 Minutes Intravenous  Once 11/22/11 0254 11/22/11 0518   11/22/11 0230   metroNIDAZOLE (FLAGYL) IVPB 500 mg  Status:  Discontinued        500 mg 100 mL/hr over 60 Minutes Intravenous  Once 11/22/11 0229 11/22/11 0230   11/21/11 2230   ciprofloxacin (CIPRO) IVPB 400 mg  Status:  Discontinued        400 mg 200 mL/hr over 60 Minutes Intravenous 2 times daily 11/21/11 2207 11/29/11 0840   11/21/11 2230   metroNIDAZOLE (FLAGYL) IVPB 500 mg        500 mg 100 mL/hr over 60 Minutes Intravenous 3 times per day 11/21/11 2207     11/21/11 2100   meropenem (MERREM) 1 g in sodium chloride 0.9 % 100 mL IVPB  Status:  Discontinued  1 g 200 mL/hr over 30 Minutes Intravenous 3 times per day 11/21/11 2013 11/21/11 2146          Assessment/Plan: Problem List: Patient Active Problem List  Diagnosis  . Follicular Low Grade B-Cell Lymphoma  . Esophageal abnormality  . Arthritis  . Dehydration  . Diarrhea  . Diverticulitis of colon with perforation  . Septic shock  . Acute respiratory failure  . Atrial fibrillation  . Acute systolic CHF (congestive heart failure)  . Pleural effusion on right  . Tracheostomy status    Awaiting results of CT scan to assess intraabdominal air  14 Days Post-Op    LOS: 15 days   Matt B. Daphine Deutscher, MD, [facility] [phone] beeper [phone]  12/06/2011 9:20 AM

## 2011-12-06 NOTE — Progress Notes (Signed)
Per previous shift RN, lab reported that Pt's blood sample collected for bmet was contaminated. Lab advised that lab should be reordered and recollected. RN will reorder BMET per previous order.

## 2011-12-06 NOTE — Progress Notes (Signed)
PARENTERAL NUTRITION CONSULT NOTE   Pharmacy Consult for TPN Indication: Prolonged ileus  Allergies  Allergen Reactions  . Codeine Hives  . Other Nausea And Vomiting    anesthesia  . Penicillins Hives  . Sulfa Antibiotics Hives   Patient Measurements: Height: 4\' 11"  (149.9 cm) (4.11) Weight: 129 lb 13.6 oz (58.9 kg) IBW/kg (Calculated) : 43.2  Adjusted Body Weight: 50.4  Vital Signs: Temp: 99.8 F (37.7 C) (09/14 0800) Temp src: Oral (09/14 0800) BP: 126/87 mmHg (09/14 0900) Pulse Rate: 96  (09/14 0900) Intake/Output from previous day: 09/13 0701 - 09/14 0700 In: 2170.5 [I.V.:369; IV Piggyback:868; TPN:933.5] Out: 5350 [Urine:5320; Stool:30]  Labs:  [facility] 12/06/11 0400 12/05/11 0240 12/04/11 0510  WBC 15.5* 14.9* 14.8*  HGB 8.3* 9.7* 7.9*  HCT 25.2* 29.1* 23.5*  PLT 335 286 302  APTT -- 25 --  INR -- 1.10 --    Basename 12/06/11 0400 12/05/11 0240 12/04/11 0510  NA 139 137 139  K 2.5* 2.8* 2.8*  CL 103 100 98  CO2 29 34* 37*  GLUCOSE 115* 84 112*  BUN 30* 26* 28*  CREATININE 0.53 0.45* 0.42*  LABCREA -- -- --  CREAT24HRUR -- -- --  CALCIUM 7.7* 7.1* 7.4*  MG 2.1 1.8 1.8  PHOS 2.9 2.8 3.0  PROT -- 4.0* 3.9*  ALBUMIN -- 1.5* 1.5*  AST -- 18 15  ALT -- 12 10  ALKPHOS -- 183* 133*  BILITOT -- 0.3 0.3  BILIDIR -- <0.1 --  IBILI -- NOT CALCULATED --  PREALBUMIN -- -- --  TRIG -- -- --  CHOLHDL -- -- --  CHOL -- -- --   Estimated Creatinine Clearance: 46 ml/min (by C-G formula based on Cr of 0.53).    Basename 12/06/11 0821 12/06/11 0310 12/05/11 2325  GLUCAP 103* 125* 125*   CBGs & Insulin requirements past 24 hours:   CBGs = 78-150  Resistant SSI Q4h: 6 units given  Regular insulin in TNA NFA:[id number] bag = none, removed 9/13  Nutritional Goals:   RD recs 12/02/11: re-estimated needs 1118 kcal, 80-90 gm protein  Current nutrition:   Diet: NPO  Tube feeds: Vital AF 1.2 at 20 ml/hr (provides 36g protein and 576 kcal)  Clinimix E  5/15 at 40 ml/hr (+ Lipids MWF)  provides: 48 g/day protein and an average of 888 Kcal/day (1162 Kcal/day MWF, 682 Kcal/day STTHS).  Plan goal TNA rate E 5/15 at 70 ml/hr to deliver 84 gm protein/avg 1398 kcal/day.  IVF: NS at [facility]  Assessment:   78 yo F admit on 8/30 w/ ruptured sigmoid diverticulum, peritonitis, and pelvic abscess. S/p laparotomy 8/31.  TNA started 9/4 for prolonged ileus.  B-Cell Lymphoma currently receiving chemotherapy.   Surgery resumed TF after trach 9/13 and patient pulled out N/G tube.  CBGs: decreased with TF d/c.  Renal/hepatic function:  WNL  Electrolytes: K=2.5 (MD has already ordered replacement of 9 runs). Corrected Ca WNL.  LFT's: Alk Phos elevated, others wnl  Pre-Albumin: 11.7 (9/4), 22.6 (9/9)  TG/Cholesterol: wnl 9/8  GI prophylaxis:  pantoprazole  Plan:  At 1800 tonight Increase Clinimix E 5/15 to 50 ml/hr,  No insulin in TNA.  Continue current resistant SSI. TNA to contain standard multivitamins and trace elements; and Lipid infusion on MWF only due to ongoing shortage Continue IVF at [facility] K+ replacement per MD TNA lab panels on Mondays & Thursdays. F/u daily.  Labs ordered for AM.  Otho Bellows , PharmD,  Pager: [phone] 12/06/2011  9:48 AM  9:39 AM

## 2011-12-06 NOTE — Progress Notes (Signed)
CRITICAL VALUE ALERT  Critical value received:  k 2.4  Date of notification:  91413  Time of notification:  0548  Critical value read back:yes    Nurse who received alert:  c Trexton Escamilla  MD notified (1st page): elink  Time of first page:  0551 MD notified (2nd page):  Time of second page:  Responding MD:  elink  Time MD responded:  [phone]

## 2011-12-06 NOTE — Progress Notes (Signed)
Hypokalemia   K replaced  

## 2011-12-06 NOTE — Progress Notes (Signed)
Pt pulled out NG tube. RN explained need for and importance of NG tube for tube feeds but pt refused having it replaced. MD notified for need for IVF with dextrose.  TNA infusing so verbal order received that dextrose is not needed in IV fluids.  Will monitor CBGs per order.

## 2011-12-06 NOTE — Progress Notes (Signed)
Per Dr. Vassie Loll- PT placed on ATC (35%)- tolerating well at this time- RN aware of ATC and productive cough- currently sp02=99%, HR 100, RR 22, BP 134, 51, BBS RH-Dim.

## 2011-12-06 NOTE — Progress Notes (Signed)
Name: [name] MRN: [id number] DOB: 20-Mar-1932    LOS: 15  Referring Provider:  Dr. Gerrit Friends  Reason for Referral:  Septic shock  PULMONARY / CRITICAL CARE MEDICINE  HPI:  78 yo female admitted 11/21/2011 following several days of abdominal pain.  Found to have perforated sigmoid diverticulum with peritonitis, and pelvic abscess.  Had laparotomy 8/31.  Developed respiratory failure and shock post-op.  PCCM consulted 8/31.  She has hx of B cell lymphoma currently on chemo as outpt.  Events Since Admission: 8/31 Laparotomy, sigmoid colectomy and ostomy 9/01 A fib with RVR 9/02 Off pressors 9/06 A fib RVR>>back to sinus; added precedex for agitation; hypotensive.  Rt thoracentesis (transudate). 9/9 Reintubated. 9/14 XR shows free air in abdomen & sub cut emphysema  Subjective:  Pulled NG out, does not want it reinserted  Vital Signs: Temp:  [98.3 F (36.8 C)-99.3 F (37.4 C)] 98.7 F (37.1 C) (09/14 0400) Pulse Rate:  [52-117] 117  (09/14 0600) Resp:  [15-23] 22  (09/14 0600) BP: (80-146)/(32-55) 130/55 mmHg (09/14 0600) SpO2:  [98 %-100 %] 98 % (09/14 0814) FiO2 (%):  [30 %-100 %] 30 % (09/14 0814) Weight:  [58.9 kg (129 lb 13.6 oz)] 58.9 kg (129 lb 13.6 oz) (09/14 0400)  Intake/Output Summary (Last 24 hours) at 12/06/11 0832 Last data filed at 12/06/11 9147  Gross per 24 hour  Intake 2001.63 ml  Output   5200 ml  Net -3198.37 ml   Physical Examination: General: Restless, appears in mild distress Neuro:, moves extremities, follows commands inconsistently HEENT: trach sutured,  mucus membranes intact but dry. Neck:  Supple Cardiovascular: s1s2 regular, no murmur Lungs: tachypnea, clear but decreased breath sounds Abdomen:  Wound dressings clean. Stoma red small amount of stool noted.  Hypoactive bowel sounds, soft, non tender Musculoskeletal:  No edema, moves ext to command Skin:  Midline incision and Left sided colostomy placement, other wise intact. No rashes.     Crepitus over chest wall & LUE  ASSESSMENT AND PLAN  PULMONARY  Lab 12/06/11 0408 12/05/11 0420 12/04/11 0455 12/03/11 0630 12/03/11 0454  PHART 7.508* 7.509* 7.583* 7.581* 7.614*  PCO2ART 33.9* 39.0 36.0 34.7* 32.2*  PO2ART 115.0* 114.0* 113.0* 125.0* 140.0*  HCO3 26.7* 30.8* 34.1* 32.8* 32.9*  O2SAT 99.4 99.5 99.5 99.6 99.3   CXR:  9/14 Interval development of significant subcutaneous emphysema over the  left chest wall and free air in the abdomen under the right hemidiaphragm.   Ventilator Settings: PCV RR 12, PS10, PEEP5, FiO2 of 30%.  ETT:  8/31 >> 9/8 >>> 9/9 >>>9/13 Trach 9/13 >>  A: 1) Acute respiratory failure in setting of septic shock from peritonitis. Failed Extubation on 9/8. Restlessness and tachypnea despite precedex infusion. Agitation and anxiety & gen weakness are her major barriers at this point.  Clinically stable - hence free air & sub cut emphysema appear trach related rather than new bowel perf or pnthx    P:   PS trials _> wean to ATC if tolerated CT chest & abdomen   CARDIOVASCULAR  Lab 12/05/11 0240  TROPONINI <0.30  LATICACIDVEN 1.2  PROBNP --   Lines:  [place]t [place] CVL 8/31 >>   Echo 9/01>>periapical akinesis, EF 25 to 30%, diffuse hypokinesis, grade 1 diastolic dysfx, mod AR, mild MR, mod TR, mild/mod RV systolic dysfx, PAS 37 mmHg  A:  1) Septic shock 2nd to peritonitis>>resolved, now HTN. 2) A fib with RVR>>back in sinus 9/02 and remained. 3) Acute systolic CHF>>?if this  is sepsis related cardiomyopathy. 4) Hypotension 9/06 more likely related to diuresis, sedation and lopressor use rather than recurrent sepsis    P:  Diurese as in renal section. Change to Lopressor 12.5 mg PO BID.  RENAL  Lab 12/06/11 0400 12/05/11 0240 12/04/11 0510 12/03/11 1700 12/03/11 0440 12/02/11 0500  NA 139 137 139 137 139 --  K 2.5* 2.8* -- -- -- --  CL 103 100 98 101 98 --  CO2 29 34* 37* 35* 36* --  BUN 30* 26* 28* 27* 31* --  CREATININE 0.53 0.45*  0.42* 0.40* 0.46* --  CALCIUM 7.7* 7.1* 7.4* 7.3* 7.5* --  MG 2.1 1.8 1.8 2.1 1.9 --  PHOS 2.9 2.8 3.0 -- 3.6 2.6   Intake/Output      09/13 0701 - 09/14 0700 09/14 0701 - 09/15 0700   I.V. (mL/kg) 369 (6.3)    NG/GT     IV Piggyback 868    TPN 933.5    Total Intake(mL/kg) 2170.5 (36.9)    Urine (mL/kg/hr) 5320 (3.8)    Stool 30    Total Output 5350    Net -3179.5          Foley:  Placed 11/21/2011  A: Hypokalemia, hypomagnesemia P:   Replaced Monitor renal fx, urine outpt Hold Lasix x 24h  Dc Diamox for lasix induced alkalosis.  GASTROINTESTINAL  Lab 12/05/11 0240 12/04/11 0510 12/01/11 0408  AST 18 15 20   ALT 12 10 12   ALKPHOS 183* 133* 148*  BILITOT 0.3 0.3 0.4  PROT 4.0* 3.9* 4.4*  ALBUMIN 1.5* 1.5* 1.8*   A:  Perforated sigmoid diverticulum s/p laparotomy>>CT abd/pelvis 9/06 not impressive for abscess, but very vague read. Surgery is following.   P:   Post-op care per [facility]. TF started 9/08 per [facility]- hold until she can be convinced to reinsert NG Repeat CT abdomen , for pelvic fluid collection   HEMATOLOGIC  Lab 12/06/11 0400 12/05/11 0240 12/04/11 0510 12/03/11 0440 12/02/11 0500  HGB 8.3* 9.7* 7.9* 7.7* 7.9*  HCT 25.2* 29.1* 23.5* 22.8* 22.6*  PLT 335 286 302 258 239  INR -- 1.10 -- -- --  APTT -- 25 -- -- --   A:  1)  Follicular B cell lymphoma, currently undergoing chemotherapy. 2)  Anemia of critical illness, and chronic disease. 3)  Thrombocytopenia likely related to sepsis/critical illness>>improved. P:  F/u CBC Transfuse for Hb < 7 LFTs periodically while on TNA  INFECTIOUS  Lab 12/06/11 0400 12/05/11 0240 12/04/11 0510 12/03/11 0440 12/02/11 0500  WBC 15.5* 14.9* 14.8* 18.6* 27.5*  PROCALCITON -- -- -- -- --   Cultures: Blood 8/31 >>negative Urine 8/31 >>negative Sputum 9/05 >>Oral flora Rt pleural fluid 9/06>>neg Urine 9/06: E coli (pansens) C diff 9/06>>negative Blood 9/06>> no growth Urine culture 9/11>>  Antibiotics: Cipro  8/31 >>9/06 Flagyl 8/31 (peritonitis)>> Fluconazole 8/31(peritonitis) >>9/13 Aztreonam 9/07>> Vancomycin 9/9>>9/11  A:   1) Peritonitis 2nd to sigmoid perforation>>improved. 2) Possible HCAP (no organisms specified)  3) leukocytosis>>she is getting solu cortef, WBCs trending down  E coli in urine cx 9/06. Has had same foley since 8/30 Hx of PCN allergy.  P:   D15 flagyl D 8 aztreonam. Ct abx for now until clarification on CT D/ced vanc-->no MRSA identified. Completed a 14 day course of fluconazole F/u culture results and adjust abx as appropriate Changed foley catheter, repeat UA and cultures. Placed PICC and d/ced TLC.  ENDOCRINE  Lab 12/06/11 0310 12/05/11 2325 12/05/11 1935 12/05/11 1635 12/05/11  1323  GLUCAP 125* 125* 150* 110* 119*   A:  Hyperglycemia. P:  Blood sugar monitoring Q4H and SSI  NEUROLOGIC  A:  Anxiety/ acute encephalopathy  most likely ventilator related.  Currently this is inhibiting possible extubation. P:   Added low dose Seroquel  - on hold until we can use her gut     LTAC placement consult in motion, PEG will be deferred to surgery (family would like to see if she can get off the vent first prior to deciding on PEG per 9/12 discussion), continue diureses. Will use family to convince her to reinsert NGT  Discussed with surgery Care during the described time interval was provided by me and/or other providers on the critical care team.  I have reviewed this patient's available data, including medical history, events of note, physical examination and test results as part of my evaluation  CC time x 35 m  Cyril Mourning MD. Tonny Bollman. [facility] Pager [phone] If no response call [phone]

## 2011-12-06 NOTE — Progress Notes (Signed)
Ok per Dr Frederico Hamman to attempt abg on same side of PICC.

## 2011-12-07 ENCOUNTER — Inpatient Hospital Stay (HOSPITAL_COMMUNITY): Payer: Medicare Other

## 2011-12-07 LAB — GLUCOSE, CAPILLARY: Glucose-Capillary: 133 mg/dL — ABNORMAL HIGH (ref 70–99)

## 2011-12-07 LAB — COMPREHENSIVE METABOLIC PANEL
AST: 17 U/L (ref 0–37)
Albumin: 1.7 g/dL — ABNORMAL LOW (ref 3.5–5.2)
BUN: 22 mg/dL (ref 6–23)
Calcium: 7.6 mg/dL — ABNORMAL LOW (ref 8.4–10.5)
Creatinine, Ser: 0.36 mg/dL — ABNORMAL LOW (ref 0.50–1.10)
GFR calc non Af Amer: >90 mL/min (ref >90)

## 2011-12-07 LAB — CBC
MCH: 30.9 pg (ref 26.0–34.0)
MCV: 93.1 fL (ref 78.0–100.0)
Platelets: 319 10*3/uL (ref 150–400)
RDW: 18.5 % — ABNORMAL HIGH (ref 11.5–15.5)

## 2011-12-07 LAB — PROCALCITONIN: Procalcitonin: 0.1 ng/mL

## 2011-12-07 MED ORDER — POTASSIUM CHLORIDE 10 MEQ/50ML IV SOLN
10.0000 meq | INTRAVENOUS | Status: AC
  Administered 2011-12-07 (×4): 10 meq via INTRAVENOUS
  Filled 2011-12-07: qty 200

## 2011-12-07 MED ORDER — METOPROLOL TARTRATE 1 MG/ML IV SOLN
5.0000 mg | Freq: Two times a day (BID) | INTRAVENOUS | Status: DC
  Administered 2011-12-07 – 2011-12-09 (×5): 5 mg via INTRAVENOUS
  Filled 2011-12-07 (×7): qty 5

## 2011-12-07 MED ORDER — FUROSEMIDE 10 MG/ML IJ SOLN
40.0000 mg | Freq: Every day | INTRAMUSCULAR | Status: DC
  Administered 2011-12-07 – 2011-12-09 (×3): 40 mg via INTRAVENOUS
  Filled 2011-12-07 (×3): qty 4

## 2011-12-07 MED ORDER — CLINIMIX E/DEXTROSE (5/15) 5 % IV SOLN
INTRAVENOUS | Status: AC
  Administered 2011-12-07: 17:00:00 via INTRAVENOUS
  Filled 2011-12-07: qty 2000

## 2011-12-07 NOTE — Progress Notes (Signed)
PARENTERAL NUTRITION CONSULT NOTE   Pharmacy Consult for TPN Indication: Prolonged ileus  Allergies  Allergen Reactions  . Codeine Hives  . Other Nausea And Vomiting    anesthesia  . Penicillins Hives  . Sulfa Antibiotics Hives   Patient Measurements: Height: 4\' 11"  (149.9 cm) (4.11) Weight: 129 lb 13.6 oz (58.9 kg) IBW/kg (Calculated) : 43.2  Adjusted Body Weight: 50.4  Vital Signs: Temp: 99.6 F (37.6 C) (09/15 0400) Temp src: Oral (09/15 0400) BP: 141/51 mmHg (09/15 0600) Pulse Rate: 108  (09/15 0600) Intake/Output from previous day: 09/14 0701 - 09/15 0700 In: 2530 [I.V.:70; IV Piggyback:1260; TPN:1200] Out: 2880 [Urine:2800; Stool:80]  Labs:  [facility] 12/07/11 0645 12/06/11 0400 12/05/11 0240  WBC 15.6* 15.5* 14.9*  HGB 8.1* 8.3* 9.7*  HCT 24.4* 25.2* 29.1*  PLT 319 335 286  APTT -- -- 25  INR -- -- 1.10    Basename 12/07/11 0645 12/06/11 2027 12/06/11 0400 12/05/11 0240  NA 140 139 139 --  K 3.0* 3.0* 2.5* --  CL 106 105 103 --  CO2 27 27 29  --  GLUCOSE 140* 154* 115* --  BUN 22 24* 30* --  CREATININE 0.36* 0.40* 0.53 --  LABCREA -- -- -- --  CREAT24HRUR -- -- -- --  CALCIUM 7.6* 7.7* 7.7* --  MG -- -- 2.1 1.8  PHOS -- -- 2.9 2.8  PROT 4.3* -- -- 4.0*  ALBUMIN 1.7* -- -- 1.5*  AST 17 -- -- 18  ALT 12 -- -- 12  ALKPHOS 189* -- -- 183*  BILITOT 0.5 -- -- 0.3  BILIDIR -- -- -- <0.1  IBILI -- -- -- NOT CALCULATED  PREALBUMIN -- -- -- --  TRIG -- -- -- --  CHOLHDL -- -- -- --  CHOL -- -- -- --   Estimated Creatinine Clearance: 46 ml/min (by C-G formula based on Cr of 0.36).    Basename 12/07/11 0419 12/06/11 2349 12/06/11 1950  GLUCAP 119* 159* 120*   CBGs & Insulin requirements past 24 hours:   CBGs = 103-150  Resistant SSI Q4h: 12 units given  Regular insulin in TNA GEX:[id number] bag = none, removed 9/13  Nutritional Goals:   RD recs 12/02/11: re-estimated needs 1118 kcal, 80-90 gm protein  Current nutrition:   Diet: NPO  Tube  feeds: Held, patient pulled NG tube.  Plan goal TNA rate E 5/15 at 70 ml/hr to deliver 84 gm protein/avg 1398 kcal/day.  IVF: NS at [facility]  Assessment:   78 yo F admit on 8/30 w/ ruptured sigmoid diverticulum, peritonitis, and pelvic abscess. S/p laparotomy 8/31.  TNA started 9/4 for prolonged ileus.  B-Cell Lymphoma currently receiving chemotherapy.   Surgery resumed TF after trach 9/13 and patient pulled out N/G tube.  CBGs: increasing with TNA  Renal/hepatic function:  WNL  Electrolytes: K=3.0, has received multiple K runs past two days. Corrected Ca WNL.  LFT's: Alk Phos elevated, others wnl  Pre-Albumin: 11.7 (9/4), 22.6 (9/9)  TG/Cholesterol: wnl 9/8  GI prophylaxis:  pantoprazole  Plan:  At 1800 tonight Continue Clinimix E 5/15 at 50 ml/hr,  No insulin in TNA.  Continue current resistant SSI. Consider adding again to TNA formula. Plan is to hopefully reinsert NG for enteral feeds, will advance TNA slowly. TNA to contain standard multivitamins and trace elements; and Lipid infusion on MWF only due to ongoing shortage Continue IVF at [facility] TNA lab panels on Mondays & Thursdays. F/u daily.  Labs ordered for AM.  Otho Bellows ,  PharmD,  Pager: [phone] 12/07/2011 7:42 AM  7:42 AM

## 2011-12-07 NOTE — Progress Notes (Signed)
Nutrition Follow-up  Intervention:  Recommend increasing tpn to goal of 70 ml/hr.  TPN at 70 ml/hr plus 20% lipids at 10 ml/hr MWF to provide:  1398 kcal, 84 gm protein weekly average.  Monitor re:  SLP swallow eval results.  Assessment:  Pt s/p trach 9/13. Trach collar.  Currently NPO and TF held  Secondary to pt pulling NG tube.  Pt refuses to have it reinserted.  Pt for swallowing eval.  Mental status- not clear/appropriate.  TPN:  Clinimix E 5/15 at 50 ml/hr + 20% lipids at 10 ml/hr MWF (1058 kcal, 60 gm protein weekly average)  Diet Order:  NPO  Meds: Scheduled Meds:   . ipratropium  0.5 mg Nebulization QID   And  . albuterol  2.5 mg Nebulization QID  . antiseptic oral rinse  15 mL Mouth Rinse QID  . chlorhexidine  15 mL Mouth Rinse BID  . feeding supplement (VITAL AF 1.2 CAL)  1,000 mL Per Tube Q24H  . furosemide  40 mg Intravenous Daily  . hydrocortisone sodium succinate  25 mg Intravenous Q12H  . insulin aspart  0-20 Units Subcutaneous Q4H  . metoprolol  5 mg Intravenous Q12H  . pantoprazole (PROTONIX) IV  40 mg Intravenous QHS  . potassium chloride  10 mEq Intravenous Q1 Hr x 6  . potassium chloride  10 mEq Intravenous Q1 Hr x 4  . potassium chloride  10 mEq Intravenous Q1 Hr x 4  . sodium chloride  10-40 mL Intracatheter Q12H  . DISCONTD: aztreonam  2 g Intravenous Q8H  . DISCONTD: metronidazole  500 mg Intravenous Q8H  . DISCONTD: QUEtiapine  25 mg Oral BID   Continuous Infusions:   . sodium chloride 20 mL/hr (12/04/11 0034)  . fat emulsion 250 mL (12/06/11 1610)  . TPN (CLINIMIX) +/- additives 40 mL/hr at 12/06/11 0638  . TPN (CLINIMIX) +/- additives 50 mL/hr at 12/06/11 1728  . TPN (CLINIMIX) +/- additives     PRN Meds:.albuterol, albuterol, fentaNYL, iohexol, ipratropium, midazolam, ondansetron, sodium chloride  Labs:  CMP     Component Value Date/Time   NA 140 12/07/2011 0645   NA 134* 11/20/2011 1055   K 3.0* 12/07/2011 0645   K 2.9* 11/20/2011 1055   CL  106 12/07/2011 0645   CL 96* 11/20/2011 1055   CO2 27 12/07/2011 0645   CO2 26 11/20/2011 1055   GLUCOSE 140* 12/07/2011 0645   GLUCOSE 167* 11/20/2011 1055   BUN 22 12/07/2011 0645   BUN 11.0 11/20/2011 1055   CREATININE 0.36* 12/07/2011 0645   CREATININE 0.8 11/20/2011 1055   CALCIUM 7.6* 12/07/2011 0645   PROT 4.3* 12/07/2011 0645   PROT 5.4* 11/20/2011 1055   ALBUMIN 1.7* 12/07/2011 0645   ALBUMIN 2.8* 11/20/2011 1055   AST 17 12/07/2011 0645   AST 13 11/20/2011 1055   ALT 12 12/07/2011 0645   ALT 14 11/20/2011 1055   ALKPHOS 189* 12/07/2011 0645   ALKPHOS 97 11/20/2011 1055   BILITOT 0.5 12/07/2011 0645   BILITOT 0.90 11/20/2011 1055   GFRNONAA >90 12/07/2011 0645   GFRAA >90 12/07/2011 0645     Intake/Output Summary (Last 24 hours) at 12/07/11 1015 Last data filed at 12/07/11 9604  Gross per 24 hour  Intake   2220 ml  Output   2680 ml  Net   -460 ml    Weight Status:  58.9 KG (RANGE: 52.9-67.1 since admit)  Re-estimated needs:  1300-1400 kcal, 80-90 gm protein  Nutrition Dx:  Inadequate oral intake-ongoing  Goal:  Meet >90% estimated needs with nutrition support.  Diet advancement as medically appropriate.  Monitor:  TPN with pharmacy, diet initiation, weights, labs   Oran Rein, RD, LDN Clinical Inpatient Dietitian Pager:  [phone] Weekend and after hours pager:  [phone]

## 2011-12-07 NOTE — Progress Notes (Signed)
Patient ID: [name], female   DOB: [birth year], 78 y.o.   MRN: [id number] [facility] Surgery Progress Note:   15 Days Post-Op  Subjective: Mental status is not clear.  Eyes open but not real appropriate. Objective: Vital signs in last 24 hours: Temp:  [99.2 F (37.3 C)-99.8 F (37.7 C)] 99.6 F (37.6 C) (09/15 0400) Pulse Rate:  [83-124] 108  (09/15 0600) Resp:  [17-30] 21  (09/15 0600) BP: (126-165)/(47-87) 141/51 mmHg (09/15 0600) SpO2:  [97 %-100 %] 100 % (09/15 0600) FiO2 (%):  [28 %-35 %] 28 % (09/15 0600)  Intake/Output from previous day: 09/14 0701 - 09/15 0700 In: 2530 [I.V.:70; IV Piggyback:1260; TPN:1200] Out: 2880 [Urine:2800; Stool:80] Intake/Output this shift:    Physical Exam: Work of breathing is  On trach collar.  Ostomy is full of air.    Lab Results:  Results for orders placed during the hospital encounter of 11/21/11 (from the past 48 hour(s))  GLUCOSE, CAPILLARY     Status: Abnormal   Collection Time   12/05/11  1:23 PM      Component Value Range Comment   Glucose-Capillary 119 (*) 70 - 99 mg/dL   GLUCOSE, CAPILLARY     Status: Abnormal   Collection Time   12/05/11  4:35 PM      Component Value Range Comment   Glucose-Capillary 110 (*) 70 - 99 mg/dL    Comment 1 Documented in Chart      Comment 2 Notify RN     GLUCOSE, CAPILLARY     Status: Abnormal   Collection Time   12/05/11  7:35 PM      Component Value Range Comment   Glucose-Capillary 150 (*) 70 - 99 mg/dL    Comment 1 Documented in Chart      Comment 2 Notify RN     GLUCOSE, CAPILLARY     Status: Abnormal   Collection Time   12/05/11 11:25 PM      Component Value Range Comment   Glucose-Capillary 125 (*) 70 - 99 mg/dL    Comment 1 Documented in Chart      Comment 2 Notify RN     GLUCOSE, CAPILLARY     Status: Abnormal   Collection Time   12/06/11  3:10 AM      Component Value Range Comment   Glucose-Capillary 125 (*) 70 - 99 mg/dL    Comment 1 Documented in Chart      Comment 2 Notify RN     BASIC METABOLIC PANEL     Status: Abnormal   Collection Time   12/06/11  4:00 AM      Component Value Range Comment   Sodium 139  135 - 145 mEq/L    Potassium 2.5 (*) 3.5 - 5.1 mEq/L    Chloride 103  96 - 112 mEq/L    CO2 29  19 - 32 mEq/L    Glucose, Bld 115 (*) 70 - 99 mg/dL    BUN 30 (*) 6 - 23 mg/dL    Creatinine, Ser 6.57  0.50 - 1.10 mg/dL    Calcium 7.7 (*) 8.4 - 10.5 mg/dL    GFR calc non Af Amer 89 (*) >90 mL/min    GFR calc Af Amer >90  >90 mL/min   CBC     Status: Abnormal   Collection Time   12/06/11  4:00 AM      Component Value Range Comment   WBC 15.5 (*) 4.0 - 10.5 K/uL  RBC 2.74 (*) 3.87 - 5.11 MIL/uL    Hemoglobin 8.3 (*) 12.0 - 15.0 g/dL    HCT 47.8 (*) 29.5 - 46.0 %    MCV 92.0  78.0 - 100.0 fL    MCH 30.3  26.0 - 34.0 pg    MCHC 32.9  30.0 - 36.0 g/dL    RDW 62.1 (*) 30.8 - 15.5 %    Platelets 335  150 - 400 K/uL   MAGNESIUM     Status: Normal   Collection Time   12/06/11  4:00 AM      Component Value Range Comment   Magnesium 2.1  1.5 - 2.5 mg/dL   PHOSPHORUS     Status: Normal   Collection Time   12/06/11  4:00 AM      Component Value Range Comment   Phosphorus 2.9  2.3 - 4.6 mg/dL   BLOOD GAS, ARTERIAL     Status: Abnormal   Collection Time   12/06/11  4:08 AM      Component Value Range Comment   FIO2 0.30      Delivery systems VENTILATOR      Mode PRESSURE REGULATED VOLUME CONTROL      VT 400      Rate 18      Peep/cpap 5.0      pH, Arterial 7.508 (*) 7.350 - 7.450    pCO2 arterial 33.9 (*) 35.0 - 45.0 mmHg    pO2, Arterial 115.0 (*) 80.0 - 100.0 mmHg    Bicarbonate 26.7 (*) 20.0 - 24.0 mEq/L    TCO2 25.0  0 - 100 mmol/L    Acid-Base Excess 3.9 (*) 0.0 - 2.0 mmol/L    O2 Saturation 99.4      Patient temperature 98.8      Collection site LEFT RADIAL      Drawn by [id number]      Sample type ARTERIAL      Allens test (pass/fail) PASS  PASS   GLUCOSE, CAPILLARY     Status: Abnormal   Collection Time   12/06/11  8:21  AM      Component Value Range Comment   Glucose-Capillary 103 (*) 70 - 99 mg/dL    Comment 1 Documented in Chart      Comment 2 Notify RN     GLUCOSE, CAPILLARY     Status: Abnormal   Collection Time   12/06/11 12:21 PM      Component Value Range Comment   Glucose-Capillary 150 (*) 70 - 99 mg/dL   GLUCOSE, CAPILLARY     Status: Abnormal   Collection Time   12/06/11  5:07 PM      Component Value Range Comment   Glucose-Capillary 131 (*) 70 - 99 mg/dL    Comment 1 Notify RN      Comment 2 Documented in Chart     GLUCOSE, CAPILLARY     Status: Abnormal   Collection Time   12/06/11  7:50 PM      Component Value Range Comment   Glucose-Capillary 120 (*) 70 - 99 mg/dL    Comment 1 Documented in Chart      Comment 2 Notify RN     BASIC METABOLIC PANEL     Status: Abnormal   Collection Time   12/06/11  8:27 PM      Component Value Range Comment   Sodium 139  135 - 145 mEq/L    Potassium 3.0 (*) 3.5 - 5.1 mEq/L  Chloride 105  96 - 112 mEq/L    CO2 27  19 - 32 mEq/L    Glucose, Bld 154 (*) 70 - 99 mg/dL    BUN 24 (*) 6 - 23 mg/dL    Creatinine, Ser 1.61 (*) 0.50 - 1.10 mg/dL    Calcium 7.7 (*) 8.4 - 10.5 mg/dL    GFR calc non Af Amer >90  >90 mL/min    GFR calc Af Amer >90  >90 mL/min   GLUCOSE, CAPILLARY     Status: Abnormal   Collection Time   12/06/11 11:49 PM      Component Value Range Comment   Glucose-Capillary 159 (*) 70 - 99 mg/dL    Comment 1 Documented in Chart      Comment 2 Notify RN     GLUCOSE, CAPILLARY     Status: Abnormal   Collection Time   12/07/11  4:19 AM      Component Value Range Comment   Glucose-Capillary 119 (*) 70 - 99 mg/dL    Comment 1 Documented in Chart      Comment 2 Notify RN     CBC     Status: Abnormal   Collection Time   12/07/11  6:45 AM      Component Value Range Comment   WBC 15.6 (*) 4.0 - 10.5 K/uL    RBC 2.62 (*) 3.87 - 5.11 MIL/uL    Hemoglobin 8.1 (*) 12.0 - 15.0 g/dL    HCT 09.6 (*) 04.5 - 46.0 %    MCV 93.1  78.0 - 100.0 fL      MCH 30.9  26.0 - 34.0 pg    MCHC 33.2  30.0 - 36.0 g/dL    RDW 40.9 (*) 81.1 - 15.5 %    Platelets 319  150 - 400 K/uL   COMPREHENSIVE METABOLIC PANEL     Status: Abnormal   Collection Time   12/07/11  6:45 AM      Component Value Range Comment   Sodium 140  135 - 145 mEq/L    Potassium 3.0 (*) 3.5 - 5.1 mEq/L    Chloride 106  96 - 112 mEq/L    CO2 27  19 - 32 mEq/L    Glucose, Bld 140 (*) 70 - 99 mg/dL    BUN 22  6 - 23 mg/dL    Creatinine, Ser 9.14 (*) 0.50 - 1.10 mg/dL    Calcium 7.6 (*) 8.4 - 10.5 mg/dL    Total Protein 4.3 (*) 6.0 - 8.3 g/dL    Albumin 1.7 (*) 3.5 - 5.2 g/dL    AST 17  0 - 37 U/L    ALT 12  0 - 35 U/L    Alkaline Phosphatase 189 (*) 39 - 117 U/L    Total Bilirubin 0.5  0.3 - 1.2 mg/dL    GFR calc non Af Amer >90  >90 mL/min    GFR calc Af Amer >90  >90 mL/min   PROCALCITONIN     Status: Normal   Collection Time   12/07/11  6:45 AM      Component Value Range Comment   Procalcitonin 0.10       Radiology/Results: Ct Chest Wo Contrast  12/06/2011  *RADIOLOGY REPORT*  Clinical Data:  Subcutaneous emphysema over the chest and free intraperitoneal gas in the abdomen.  CHEST CT WITHOUT CONTRAST  Technique:  Multidetector CT imaging of the chest was performed following the standard protocol without intravenous contrast.  Comparison:  11/28/2011.  11/11/2011.  Findings:  Extensive soft tissue emphysema is present involving the left chest wall musculature, left breast, and subcutaneous tissues. This is primarily anterior and lateral.  No evidence of pneumothorax.  Left upper extremity PICC tip is at the cavoatrial junction. Stable right internal jugular vein Port-A-Cath.  No evidence of mediastinal hemorrhage or emphysema.  Negative abnormal mediastinal adenopathy.  Tracheostomy tube is in place. The esophagus is mildly dilated with gas in the upper half of the thorax.  Hiatal hernia is noted distally.  Moderate bilateral pleural effusions are present.  Bibasilar  atelectasis verses airspace disease in the dependent portion of both lungs.  No acute bony deformity.  Air-fluid level in the trachea above the tracheostomy tube.  IMPRESSION: Large amount of emphysema overlying the soft tissues of the left chest wall without evidence of pneumomediastinum or pneumothorax.  Bilateral pleural effusions and bibasilar atelectasis worse airspace disease.  CT ABDOMEN AND PELVIS WITH CONTRAST  Technique:  Multidetector CT imaging of the abdomen and pelvis was performed using the standard protocol following bolus administration of intravenous contrast.  Contrast: OMNIPAQUE IOHEXOL 300 MG/ML  SOLN  Findings:  Lack of oral contrast limits examination.  Free intraperitoneal gas is seen anterior to the left lobe of the liver as well as adjacent to the gallbladder, anterior bowel loops, and the stomach.  Small hypodensities towards the dome of the liver are stable.  Spleen, adrenal glands, pancreas are within normal limits. Kidneys are stable in appearance. Gallstones are not clearly identified.  At the site of the left lower quadrant colostomy there is air and fluid about the colostomy site as well as surrounding small bowel loops extending into the pelvis.  A large amount of gas is seen extending into the subcutaneous tissue at the colostomy site and extending inferiorly.  Gas is present within the fat extending through the abdominal wall defect created for the colostomy.  Air- fluid levels in the subcutaneous fat adjacent to the colostomy site are present. Colonic mucosa within the colostomy site is also ill- defined and hazy. Inflammatory process is not excluded.  Air can be seen passing through the abdominal wall defect.  Foley catheter is present in the bladder.  The surgical incision has been opened to heal by secondary intention.  Hartmann's pouch is within normal limits.  IMPRESSION: There is a large amount of free intraperitoneal gas in the left lower quadrant and scattered free  intraperitoneal gas in the upper abdomen.  Air-fluid level in the peritoneal space is present at the left lower quadrant.  There is also a large amount of emphysema over the left abdominal wall in the overlying soft tissues.  Air can be seen traversing through the abdominal wall fixed defect at the colostomy.  These findings may simply represent a air extending from the subcutaneous tissues of the chest across the abdomen and through the wall defect into the peritoneal space as one would expect with barotrauma from lung injury.  Ruptured bowel with subsequent infection and gas-forming organism cannot be excluded. Please note that oral contrast was not administered on this examination.  Air-fluid level in the subcutaneous fat adjacent to the colostomy is also noted.  Once again, inflammatory process is not excluded. Critical Value/emergent results were called by telephone at the time of interpretation on 12/06/2011 at 1200 to Dr. Vassie Loll, who verbally acknowledged these results.   Original Report Authenticated By: Donavan Burnet, M.D.    Ct Abdomen Pelvis W Contrast  12/06/2011  *  RADIOLOGY REPORT*  Clinical Data:  Subcutaneous emphysema over the chest and free intraperitoneal gas in the abdomen.  CHEST CT WITHOUT CONTRAST  Technique:  Multidetector CT imaging of the chest was performed following the standard protocol without intravenous contrast.  Comparison:  11/28/2011.  11/11/2011.  Findings:  Extensive soft tissue emphysema is present involving the left chest wall musculature, left breast, and subcutaneous tissues. This is primarily anterior and lateral.  No evidence of pneumothorax.  Left upper extremity PICC tip is at the cavoatrial junction. Stable right internal jugular vein Port-A-Cath.  No evidence of mediastinal hemorrhage or emphysema.  Negative abnormal mediastinal adenopathy.  Tracheostomy tube is in place. The esophagus is mildly dilated with gas in the upper half of the thorax.  Hiatal hernia is noted  distally.  Moderate bilateral pleural effusions are present.  Bibasilar atelectasis verses airspace disease in the dependent portion of both lungs.  No acute bony deformity.  Air-fluid level in the trachea above the tracheostomy tube.  IMPRESSION: Large amount of emphysema overlying the soft tissues of the left chest wall without evidence of pneumomediastinum or pneumothorax.  Bilateral pleural effusions and bibasilar atelectasis worse airspace disease.  CT ABDOMEN AND PELVIS WITH CONTRAST  Technique:  Multidetector CT imaging of the abdomen and pelvis was performed using the standard protocol following bolus administration of intravenous contrast.  Contrast: OMNIPAQUE IOHEXOL 300 MG/ML  SOLN  Findings:  Lack of oral contrast limits examination.  Free intraperitoneal gas is seen anterior to the left lobe of the liver as well as adjacent to the gallbladder, anterior bowel loops, and the stomach.  Small hypodensities towards the dome of the liver are stable.  Spleen, adrenal glands, pancreas are within normal limits. Kidneys are stable in appearance. Gallstones are not clearly identified.  At the site of the left lower quadrant colostomy there is air and fluid about the colostomy site as well as surrounding small bowel loops extending into the pelvis.  A large amount of gas is seen extending into the subcutaneous tissue at the colostomy site and extending inferiorly.  Gas is present within the fat extending through the abdominal wall defect created for the colostomy.  Air- fluid levels in the subcutaneous fat adjacent to the colostomy site are present. Colonic mucosa within the colostomy site is also ill- defined and hazy. Inflammatory process is not excluded.  Air can be seen passing through the abdominal wall defect.  Foley catheter is present in the bladder.  The surgical incision has been opened to heal by secondary intention.  Hartmann's pouch is within normal limits.  IMPRESSION: There is a large amount of  free intraperitoneal gas in the left lower quadrant and scattered free intraperitoneal gas in the upper abdomen.  Air-fluid level in the peritoneal space is present at the left lower quadrant.  There is also a large amount of emphysema over the left abdominal wall in the overlying soft tissues.  Air can be seen traversing through the abdominal wall fixed defect at the colostomy.  These findings may simply represent a air extending from the subcutaneous tissues of the chest across the abdomen and through the wall defect into the peritoneal space as one would expect with barotrauma from lung injury.  Ruptured bowel with subsequent infection and gas-forming organism cannot be excluded. Please note that oral contrast was not administered on this examination.  Air-fluid level in the subcutaneous fat adjacent to the colostomy is also noted.  Once again, inflammatory process is not excluded. Critical  Value/emergent results were called by telephone at the time of interpretation on 12/06/2011 at 1200 to Dr. Vassie Loll, who verbally acknowledged these results.   Original Report Authenticated By: Donavan Burnet, M.D.    Dg Chest Port 1 View  12/07/2011  *RADIOLOGY REPORT*  Clinical Data: Subcutaneous emphysema  PORTABLE CHEST - 1 VIEW  Comparison: 12/06/2011  Findings: Tracheostomy tube, Port-A-Cath, left arm PICC are stable. Emphysema over the left chest wall is stable.  No definite pneumothorax.  Free intraperitoneal gas is unchanged.  Mild haziness at the lung bases is stable.  IMPRESSION: No pneumothorax.  No significant change.   Original Report Authenticated By: Donavan Burnet, M.D.    Dg Chest Port 1 View  12/06/2011  *RADIOLOGY REPORT*  Clinical Data: Respiratory difficulty  PORTABLE CHEST - 1 VIEW  Comparison: Yesterday  Findings: Tracheostomy, left hip, right subclavian Port-A-Cath stable.  Extensive subcutaneous gas has developed over the left chest wall.  Free air has developed in the right hemidiaphragm. Bilateral  pleural effusions not significantly changed. No pneumothorax.  IMPRESSION: Interval development of significant subcutaneous emphysema over the left chest wall and free air in the abdomen under the right hemidiaphragm.  Findings may reflect barotrauma from ventilator support.  Ruptured bowel not excluded. Critical Value/emergent results were called by telephone at the time of interpretation on 12/06/2011 at 7:40 hours to [facility], RN, who verbally acknowledged these results.   Original Report Authenticated By: Donavan Burnet, M.D.    Dg Chest Port 1 View  12/05/2011  *RADIOLOGY REPORT*  Clinical Data: Tracheostomy placement  PORTABLE CHEST - 1 VIEW  Comparison: 12/05/2011  Findings: Tracheostomy appears appropriately positioned.  Left- sided PICC line tip over high right atrium.  Right-sided Port-A- Cath tip at the cavoatrial junction.  Minimal biapical pleural thickening.  No pneumothorax.  Trace pleural fluid is stable. Heart size is normal.  Nasogastric tube is appropriately positioned.  IMPRESSION: Small pleural effusions again noted.  Support apparatus as above.  No new abnormality.   Original Report Authenticated By: Harrel Lemon, M.D.     Anti-infectives: Anti-infectives     Start     Dose/Rate Route Frequency Ordered Stop   12/01/11 2200   vancomycin (VANCOCIN) 500 mg in sodium chloride 0.9 % 100 mL IVPB  Status:  Discontinued        500 mg 100 mL/hr over 60 Minutes Intravenous Every 12 hours 12/01/11 1035 12/03/11 1159   12/01/11 1200   vancomycin (VANCOCIN) 1,250 mg in sodium chloride 0.9 % 250 mL IVPB        1,250 mg 166.7 mL/hr over 90 Minutes Intravenous  Once 12/01/11 1035 12/01/11 1305   11/29/11 1000   aztreonam (AZACTAM) 2 g in dextrose 5 % 50 mL IVPB        2 g 100 mL/hr over 30 Minutes Intravenous Every 8 hours 11/29/11 0907     11/28/11 1200   aztreonam (AZACTAM) 2 g in dextrose 5 % 50 mL IVPB  Status:  Discontinued        2 g 100 mL/hr over 30 Minutes Intravenous Every  12 hours 11/28/11 0240 11/28/11 0903   11/28/11 1000   fluconazole (DIFLUCAN) IVPB 200 mg  Status:  Discontinued        200 mg 100 mL/hr over 60 Minutes Intravenous Every 24 hours 11/28/11 0915 12/03/11 1159   11/28/11 0200   micafungin (MYCAMINE) 100 mg in sodium chloride 0.9 % 100 mL IVPB  Status:  Discontinued  Comments: Pharmacy to verify the dose      100 mg 100 mL/hr over 1 Hours Intravenous Daily at bedtime 11/28/11 0133 11/28/11 0903   11/28/11 0130   aztreonam (AZACTAM) 2 g in dextrose 5 % 50 mL IVPB        2 g 100 mL/hr over 30 Minutes Intravenous  Once 11/28/11 0130 11/28/11 0230   11/23/11 0300   fluconazole (DIFLUCAN) IVPB 200 mg  Status:  Discontinued        200 mg 100 mL/hr over 60 Minutes Intravenous Every 24 hours 11/22/11 0254 11/26/11 1442   11/22/11 0300   fluconazole (DIFLUCAN) IVPB 400 mg        400 mg 200 mL/hr over 60 Minutes Intravenous  Once 11/22/11 0254 11/22/11 0518   11/22/11 0230   metroNIDAZOLE (FLAGYL) IVPB 500 mg  Status:  Discontinued        500 mg 100 mL/hr over 60 Minutes Intravenous  Once 11/22/11 0229 11/22/11 0230   11/21/11 2230   ciprofloxacin (CIPRO) IVPB 400 mg  Status:  Discontinued        400 mg 200 mL/hr over 60 Minutes Intravenous 2 times daily 11/21/11 2207 11/29/11 0840   11/21/11 2230   metroNIDAZOLE (FLAGYL) IVPB 500 mg        500 mg 100 mL/hr over 60 Minutes Intravenous 3 times per day 11/21/11 2207     11/21/11 2100   meropenem (MERREM) 1 g in sodium chloride 0.9 % 100 mL IVPB  Status:  Discontinued        1 g 200 mL/hr over 30 Minutes Intravenous 3 times per day 11/21/11 2013 11/21/11 2146          Assessment/Plan: Problem List: Patient Active Problem List  Diagnosis  . Follicular Low Grade B-Cell Lymphoma  . Esophageal abnormality  . Arthritis  . Dehydration  . Diarrhea  . Diverticulitis of colon with perforation  . Septic shock  . Acute respiratory failure  . Atrial fibrillation  . Acute systolic CHF  (congestive heart failure)  . Pleural effusion on right  . Tracheostomy status    CT consistent with gas dissecting down from the trach site into the abdomen 15 Days Post-Op    LOS: 16 days   Matt B. Daphine Deutscher, MD, [facility] [phone] beeper [phone]  12/07/2011 8:59 AM

## 2011-12-07 NOTE — Progress Notes (Signed)
ANTIBIOTIC CONSULT NOTE - Follow Up  Pharmacy Consult for aztreonam Indication: perforated colon w/ peritonitis, possible PNA  Allergies  Allergen Reactions  . Codeine Hives  . Other Nausea And Vomiting    anesthesia  . Penicillins Hives  . Sulfa Antibiotics Hives   Patient Measurements: Height: 4\' 11"  (149.9 cm) (4.11) Weight: 129 lb 13.6 oz (58.9 kg) IBW/kg (Calculated) : 43.2   Vital Signs: Temp: 99.6 F (37.6 C) (09/15 0400) Temp src: Oral (09/15 0400) BP: 141/51 mmHg (09/15 0600) Pulse Rate: 108  (09/15 0600) Intake/Output from previous day: 09/14 0701 - 09/15 0700 In: 2530 [I.V.:70; IV Piggyback:1260; TPN:1200] Out: 2880 [Urine:2800; Stool:80] Intake/Output from this shift:    Labs:  Basename 12/07/11 0645 12/06/11 2027 12/06/11 0400 12/05/11 0240  WBC 15.6* -- 15.5* 14.9*  HGB 8.1* -- 8.3* 9.7*  PLT 319 -- 335 286  LABCREA -- -- -- --  CREATININE 0.36* 0.40* 0.53 --   Estimated Creatinine Clearance: 46 ml/min (by C-G formula based on Cr of 0.36).    Microbiology: 8/30 blood: ng-final 8/31 blood: ng-final 8/31 mrsa pcr: neg 8/31 urine: ng-final 9/5 trach aspirate: normal flora 9/6 urine:15K e. Coli (R to amp, FQ) 9/6 c.diff: neg 9/6 blood x1: NG-F 9/6: pleural fluid: NG-F 9/9: C diff: neg 9/11: urine: NG-F   Assessment: HPI: 78 year old female with B-cell lymphoma undergoing chemotherapy presents 8/30 with worsening abdominal pain. CT showed perforated diverticular disease with abscesses. IV Cipro and Flagyl were initiated with plans for laparotomy with resection and colostomy on 8/31. Pt transferred to ICU. Developed respiratory failure and worsening septic shock. Pt intubated and septic shock protocol initiated.   8/30 >> Flagyl >> 8/30 >> Cipro >> 9/7 8/31 >> Fluconazole >> 9/4, restart 9/6 >> 9/11 9/5 Aztreonam x 1 9/6 Micafungin x 1  9/7 >> aztreonam >> 9/9 >> vanco >> 9/11  Today is Day 17 of Flagyl, Day9 of aztreonam, renal function is  stable, cultures unrevealing.  WBC remain elevated, s/p Neulasta on 8/24, suspect contributing to elevation in WBC (pt also on steroids).  Trach 9/13  Plan:  Dose remains appropriate for aztreonam at 2g IV q8h. Had planned to discontinue abx yesterday, continue per CCM  LOT for Flagyl not yet defined.  Otho Bellows PharmD Pager [phone] 12/07/2011 8:07 AM

## 2011-12-07 NOTE — Progress Notes (Signed)
Name: [name] MRN: [id number] DOB: May 15, 1931    LOS: 16  Referring Provider:  Dr. Gerrit Friends  Reason for Referral:  Septic shock  PULMONARY / CRITICAL CARE MEDICINE  HPI:  78 yo female admitted 11/21/2011 following several days of abdominal pain.  Found to have perforated sigmoid diverticulum with peritonitis, and pelvic abscess.  Had laparotomy 8/31.  Developed respiratory failure and shock post-op.  PCCM consulted 8/31.  She has hx of B cell lymphoma s/p 4 cycles of CVP- rituxan last 8/13   Events Since Admission: 8/31 Laparotomy, sigmoid colectomy and ostomy 9/01 A fib with RVR 9/02 Off pressors 9/06 A fib RVR>>back to sinus; added precedex for agitation; hypotensive.  Rt thoracentesis (transudate). 9/9 Reintubated. 9/14 XR shows free air in abdomen & sub cut emphysema CT abdomen >> Large amount of emphysema overlying the soft tissues of the left chest wall without evidence of pneumomediastinum or pneumothorax. Bilateral pleural effusions and bibasilar atelectasis worse airspace disease. large amount of free intraperitoneal gas in the left lower quadrant and scattered free intraperitoneal gas in the upper  abdomen. Air-fluid level in the peritoneal space is present at the left lower quadrant. There is also a large amount of emphysema over the left abdominal wall in the overlying soft tissues   Subjective:  - does not want NG reinserted Afebrile, tolerated ATC well Denies pain  Vital Signs: Temp:  [99.2 F (37.3 C)-99.8 F (37.7 C)] 99.6 F (37.6 C) (09/15 0400) Pulse Rate:  [83-124] 108  (09/15 0600) Resp:  [17-30] 21  (09/15 0600) BP: (126-165)/(47-87) 141/51 mmHg (09/15 0600) SpO2:  [97 %-100 %] 100 % (09/15 0600) FiO2 (%):  [28 %-35 %] 28 % (09/15 0600)  Intake/Output Summary (Last 24 hours) at 12/07/11 0858 Last data filed at 12/07/11 0647  Gross per 24 hour  Intake   2430 ml  Output   2780 ml  Net   -350 ml   Physical Examination: General: Restless, appears  in mild distress Neuro:, moves extremities, follows commands well HEENT: trach sutured,  mucus membranes intact but dry. Neck:  Supple Cardiovascular: s1s2 regular, no murmur Lungs: tachypnea, clear but decreased breath sounds Abdomen:  Wound dressings clean. Stoma red with air in bag.  Hypoactive bowel sounds, soft, non tender Musculoskeletal:  No edema, moves ext to command Skin:  Midline incision and Left sided colostomy placement, other wise intact. No rashes.   Crepitus over chest wall & LUE  ASSESSMENT AND PLAN  PULMONARY  Lab 12/06/11 0408 12/05/11 0420 12/04/11 0455 12/03/11 0630 12/03/11 0454  PHART 7.508* 7.509* 7.583* 7.581* 7.614*  PCO2ART 33.9* 39.0 36.0 34.7* 32.2*  PO2ART 115.0* 114.0* 113.0* 125.0* 140.0*  HCO3 26.7* 30.8* 34.1* 32.8* 32.9*  O2SAT 99.4 99.5 99.5 99.6 99.3   CXR:  9/15 Emphysema over the left chest wall is stable. No definite pneumothorax. Free intraperitoneal gas is unchanged   Ventilator Settings: ATC   ETT:  8/31 >> 9/8 >>> 9/9 >>>9/13 Trach 9/13 >>  A: 1) Acute respiratory failure in setting of septic shock from peritonitis. Failed Extubation on 9/8. Clinically stable - hence free air & sub cut emphysema appear trach related rather than new bowel perf or pnthx    P:    ATC as tolerated   CARDIOVASCULAR  Lab 12/05/11 0240  TROPONINI <0.30  LATICACIDVEN 1.2  PROBNP --   Lines:  Lt Backus CVL 8/31 >> out LUE PICC 9/13 >>  Echo 9/01>>periapical akinesis, EF 25 to 30%, diffuse hypokinesis, grade  1 diastolic dysfx, mod AR, mild MR, mod TR, mild/mod RV systolic dysfx, PAS 37 mmHg  A:  1) Septic shock 2nd to peritonitis>>resolved, now HTN. 2) A fib with RVR>>back in sinus 9/02 and remained. 3) Acute systolic CHF>>?if this is sepsis related cardiomyopathy. 4) Hypotension 9/06 more likely related to diuresis, sedation and lopressor use rather than recurrent sepsis    P:  Diurese Change to Lopressor 5 q 12h  RENAL  Lab 12/07/11  0645 12/06/11 2027 12/06/11 0400 12/05/11 0240 12/04/11 0510 12/03/11 1700 12/03/11 0440 12/02/11 0500  NA 140 139 139 137 139 -- -- --  K 3.0* 3.0* -- -- -- -- -- --  CL 106 105 103 100 98 -- -- --  CO2 27 27 29  34* 37* -- -- --  BUN 22 24* 30* 26* 28* -- -- --  CREATININE 0.36* 0.40* 0.53 0.45* 0.42* -- -- --  CALCIUM 7.6* 7.7* 7.7* 7.1* 7.4* -- -- --  MG -- -- 2.1 1.8 1.8 2.1 1.9 --  PHOS -- -- 2.9 2.8 3.0 -- 3.6 2.6   Intake/Output      09/14 0701 - 09/15 0700 09/15 0701 - 09/16 0700   I.V. (mL/kg) 70 (1.2)    IV Piggyback 1260    TPN 1200    Total Intake(mL/kg) 2530 (43)    Urine (mL/kg/hr) 2800 (2)    Stool 80    Total Output 2880    Net -350         Emesis Occurrence 1 x     Foley:  Placed 11/21/2011  A: Hypokalemia, hypomagnesemia P:   Replaced Monitor renal fx, urine outpt Resume lasix q 24h Dc Diamox for lasix induced alkalosis.  GASTROINTESTINAL  Lab 12/07/11 0645 12/05/11 0240 12/04/11 0510 12/01/11 0408  AST 17 18 15 20   ALT 12 12 10 12   ALKPHOS 189* 183* 133* 148*  BILITOT 0.5 0.3 0.3 0.4  PROT 4.3* 4.0* 3.9* 4.4*  ALBUMIN 1.7* 1.5* 1.5* 1.8*   A:  Perforated sigmoid diverticulum s/p laparotomy>>CT abd/pelvis 9/06 not impressive for abscess, but very vague read. Surgery is following.   P:   Post-op care per [facility]CS. TF started 9/08 per CCS- on hold since ng out Swallow evaln in am   HEMATOLOGIC  Lab 12/07/11 0645 12/06/11 0400 12/05/11 0240 12/04/11 0510 12/03/11 0440  HGB 8.1* 8.3* 9.7* 7.9* 7.7*  HCT 24.4* 25.2* 29.1* 23.5* 22.8*  PLT 319 335 286 302 258  INR -- -- 1.10 -- --  APTT -- -- 25 -- --   A:  1)  Follicular B cell lymphoma, currently undergoing chemotherapy. 2)  Anemia of critical illness, and chronic disease. 3)  Thrombocytopenia likely related to sepsis/critical illness>>improved. P:  F/u CBC Transfuse for Hb < 7 LFTs periodically while on TNA  INFECTIOUS  Lab 12/07/11 0645 12/06/11 0400 12/05/11 0240 12/04/11 0510  12/03/11 0440  WBC 15.6* 15.5* 14.9* 14.8* 18.6*  PROCALCITON 0.10 -- -- -- --   Cultures: Blood 8/31 >>negative Urine 8/31 >>negative Sputum 9/05 >>Oral flora Rt pleural fluid 9/06>>neg Urine 9/06: E coli (pansens) C diff 9/06>>negative Blood 9/06>> no growth Urine culture 9/11>>ng  Antibiotics: Cipro 8/31 >>9/06 Flagyl 8/31 (peritonitis)>>9/15 Fluconazole 8/31(peritonitis) >>9/13 Aztreonam 9/07>>9/15 Vancomycin 9/9>>9/11  A:   1) Peritonitis 2nd to sigmoid perforation>>improved. 2) Possible HCAP (no organisms specified)  3) leukocytosis>>she is getting solu cortef, WBCs trending down  E coli in urine cx 9/06. Has had same foley since 8/30 Hx of PCN allergy.  P:   Dc abx & observe D/ced vanc-->no MRSA identified. Completed a 14 day course of fluconazole F/u culture results and adjust abx as appropriate Changed foley catheter, repeat UA and cultures. Placed PICC and d/ced TLC.  ENDOCRINE  Lab 12/07/11 0419 12/06/11 2349 12/06/11 1950 12/06/11 1707 12/06/11 1221  GLUCAP 119* 159* 120* 131* 150*   A:  Hyperglycemia. P:  Blood sugar monitoring Q4H and SSI  NEUROLOGIC  A:  Anxiety/ acute encephalopathy  most likely ventilator related.   P:   Dc  Seroquel  -    LTAC placement consult in motion, PEG will be deferred to surgery (family would like to see if she can get off the vent first prior to deciding on PEG per 9/12 discussion)   Discussed with surgery Care during the described time interval was provided by me and/or other providers on the critical care team.  I have reviewed this patient's available data, including medical history, events of note, physical examination and test results as part of my evaluation  CC time x 35 m  Cyril Mourning MD. Tonny Bollman. [facility] Pager [phone] If no response call [phone]

## 2011-12-08 ENCOUNTER — Inpatient Hospital Stay (HOSPITAL_COMMUNITY): Payer: Medicare Other

## 2011-12-08 DIAGNOSIS — Z930 Tracheostomy status: Secondary | ICD-10-CM

## 2011-12-08 LAB — CBC
MCH: 31.2 pg (ref 26.0–34.0)
MCHC: 33.3 g/dL (ref 30.0–36.0)
MCV: 93.5 fL (ref 78.0–100.0)
Platelets: 295 10*3/uL (ref 150–400)
RDW: 18.1 % — ABNORMAL HIGH (ref 11.5–15.5)

## 2011-12-08 LAB — GLUCOSE, CAPILLARY
Glucose-Capillary: 108 mg/dL — ABNORMAL HIGH (ref 70–99)
Glucose-Capillary: 179 mg/dL — ABNORMAL HIGH (ref 70–99)

## 2011-12-08 LAB — BASIC METABOLIC PANEL
GFR calc Af Amer: >90 mL/min (ref >90)
GFR calc non Af Amer: >90 mL/min (ref >90)
Glucose, Bld: 171 mg/dL — ABNORMAL HIGH (ref 70–99)
Potassium: 3.2 mEq/L — ABNORMAL LOW (ref 3.5–5.1)
Sodium: 135 mEq/L (ref 135–145)

## 2011-12-08 LAB — DIFFERENTIAL
Basophils Absolute: 0 10*3/uL (ref 0.0–0.1)
Eosinophils Absolute: 0.1 10*3/uL (ref 0.0–0.7)
Eosinophils Relative: 1 % (ref 0–5)
Monocytes Absolute: 0.9 10*3/uL (ref 0.1–1.0)

## 2011-12-08 LAB — COMPREHENSIVE METABOLIC PANEL
ALT: 11 U/L (ref 0–35)
Alkaline Phosphatase: 166 U/L — ABNORMAL HIGH (ref 39–117)
CO2: 29 mEq/L (ref 19–32)
Calcium: 7.5 mg/dL — ABNORMAL LOW (ref 8.4–10.5)
Chloride: 103 mEq/L (ref 96–112)
GFR calc Af Amer: >90 mL/min (ref >90)
GFR calc non Af Amer: >90 mL/min (ref >90)
Glucose, Bld: 110 mg/dL — ABNORMAL HIGH (ref 70–99)
Sodium: 139 mEq/L (ref 135–145)
Total Bilirubin: 0.4 mg/dL (ref 0.3–1.2)

## 2011-12-08 LAB — CHOLESTEROL, TOTAL: Cholesterol: 105 mg/dL (ref 0–200)

## 2011-12-08 MED ORDER — POTASSIUM CHLORIDE 10 MEQ/50ML IV SOLN
10.0000 meq | INTRAVENOUS | Status: AC
Start: 2011-12-08 — End: 2011-12-09
  Administered 2011-12-08 – 2011-12-09 (×4): 10 meq via INTRAVENOUS
  Filled 2011-12-08: qty 200

## 2011-12-08 MED ORDER — POTASSIUM PHOSPHATE DIBASIC 3 MMOLE/ML IV SOLN
30.0000 mmol | Freq: Once | INTRAVENOUS | Status: AC
  Administered 2011-12-08: 30 mmol via INTRAVENOUS
  Filled 2011-12-08: qty 10

## 2011-12-08 MED ORDER — POTASSIUM CHLORIDE 10 MEQ/50ML IV SOLN
10.0000 meq | INTRAVENOUS | Status: AC
  Administered 2011-12-08 (×4): 10 meq via INTRAVENOUS
  Filled 2011-12-08 (×3): qty 50

## 2011-12-08 MED ORDER — FENTANYL CITRATE 0.05 MG/ML IJ SOLN
25.0000 ug | INTRAMUSCULAR | Status: DC | PRN
  Administered 2011-12-08: 50 ug via INTRAVENOUS
  Administered 2011-12-08: 25 ug via INTRAVENOUS
  Administered 2011-12-09 (×2): 50 ug via INTRAVENOUS
  Filled 2011-12-08 (×4): qty 2

## 2011-12-08 MED ORDER — FAT EMULSION 20 % IV EMUL
240.0000 mL | INTRAVENOUS | Status: DC
  Administered 2011-12-08: 240 mL via INTRAVENOUS
  Filled 2011-12-08: qty 250

## 2011-12-08 MED ORDER — ZINC TRACE METAL 1 MG/ML IV SOLN
INTRAVENOUS | Status: DC
  Administered 2011-12-08: 17:00:00 via INTRAVENOUS
  Filled 2011-12-08: qty 2000

## 2011-12-08 NOTE — Discharge Summary (Signed)
Patient ID: [name] MRN: [id number] DOB/AGE: 27-May-1931 78 y.o.  Admit date: 11/21/2011 Discharge date: 12/08/2011  Procedures:  1. Exploratory laparotomy.  2. Sigmoid colectomy.  3. Descending colostomy.  4. Drainage of pelvic abscess.  2. Tracheostomy placement  Consults: pulmonary/intensive care  Reason for Admission: 78 yo WF who presents to the ER with 4-5 day hx of worsening abdominal pain. Patient currently undergoing treatment for low grade B-cell lymphoma under the care of Dr. Park Breed. Last chemoRx was one week ago. Currently neutropenic with WBC of 0.4. CTA in ER shows perforated diverticular disease with free air, extensive inflammatory changes, and developing abscesses. Dr. Jeraldine Loots in ER asks general surgery to evaluate and manage.  Admission Diagnoses:  Patient Active Problem List  Diagnosis  . Follicular Low Grade B-Cell Lymphoma  . Esophageal abnormality  . Arthritis  . Dehydration  . Diarrhea  . Diverticulitis of colon with perforation  . Septic shock  . Acute respiratory failure  . Atrial fibrillation  . Acute systolic CHF (congestive heart failure)           Hospital Course: The patient was admitted and placed in the ICU.  She was started on IV abx therapy.  The following morning she was taken to the operating room where she underwent an exploratory laparotomy with sigmoid colectomy and colostomy with drainage of a pelvic abscess after requiring intubation in the ICU preoperatively due to medical decline.  She remained intubated postoperatively and was sent back to the ICU. She remained on pressor support for several days postoperatively.  She was also noted to have A. Fib on POD# 2 and was placed on amiodarone.  She also started weaning trials but did not pass these.  She remained intubated.  She was started on TNA for nutritional support.  On POD# 6, the patient developed a worsening septic picture with hypotension again and increasing WBC.  A CT of  abd/pelvis was order which was relatively unremarkable, except findings of a PNA.  The patient's abx therapy was broadened.  Once vancomycin was added her WBC began to trend downwards.  The patient was able to be extubated on POD# 8, but required reintubation on POD# 9.  She started enteral TFs at this time.  Ultimately, the patient required tracheostomy placement.  After this was placed the patient was stabilizing out and was felt stable for transfer to a long-term acute care facility for further vent wean and care.  She did have to have several areas of her wound opened up due to excessive drainage.  These had minimal healing given her multiple co-morbidities and large steroid does, which was reduced on POD# 9, by CCM.  On potential day of discharge, the patient is awaiting a swallow study to determine how she will take her nutrition.  Otherwise, she is stable for dc to LTAC.  Discharge Diagnoses:  Principal Problem:  *Diverticulitis of colon with perforation Active Problems:  Septic shock  Acute respiratory failure  Atrial fibrillation  Acute systolic CHF (congestive heart failure)  Pleural effusion on right  Tracheostomy status s/p exploratory laparotomy with Hartman's procedure VDRF PCM/TNA  Discharge Medications:   Medication List     As of 12/08/2011 11:01 AM    STOP taking these medications         acetaminophen 500 MG tablet   Commonly known as: TYLENOL      allopurinol 100 MG tablet   Commonly known as: ZYLOPRIM      amLODipine-atorvastatin 5-40 MG per  tablet   Commonly known as: CADUET      clopidogrel 75 MG tablet   Commonly known as: PLAVIX      famotidine 10 MG tablet   Commonly known as: PEPCID      lactulose 10 GM/15ML solution   Commonly known as: CHRONULAC      lidocaine-prilocaine cream   Commonly known as: EMLA      loratadine 10 MG tablet   Commonly known as: CLARITIN      metoprolol 100 MG tablet   Commonly known as: LOPRESSOR      predniSONE  20 MG tablet   Commonly known as: DELTASONE      pregabalin 25 MG capsule   Commonly known as: LYRICA      prochlorperazine 10 MG tablet   Commonly known as: COMPAZINE      valsartan 160 MG tablet   Commonly known as: DIOVAN        Discharge Instructions:     Follow-up Information    Follow up with Velora Heckler, MD. In 3 weeks. (after discharge from [facility])    Contact information:   [address] [place] [address] [phone]        NS WD dressing changes to abdominal wound BID Should remove staples within the next day or 2 Trach care per CCM Diet per speech therapy after swallow study, if passes then hopefully a oral diet, if not then will likely need NGT replaced for enteral feeds.  Signed: Isaiah Torok E 12/08/2011, 11:01 AM

## 2011-12-08 NOTE — Progress Notes (Signed)
CRITICAL VALUE ALERT  Critical value received:  K= 2.5 Date of notification:  12/08/11  Time of notification:  0630  Critical value read back:yes  Nurse who received alert:  Reuel Boom RN  MD notified (1st page):  Dr Molli Knock  Time of first page:  0645  MD notified (2nd page):  Time of second page:  Responding MD:  Dr Molli Knock  Time MD responded:  [phone]

## 2011-12-08 NOTE — Progress Notes (Signed)
PARENTERAL NUTRITION CONSULT NOTE   Pharmacy Consult for TPN Indication: Prolonged ileus  Allergies  Allergen Reactions  . Codeine Hives  . Other Nausea And Vomiting    anesthesia  . Penicillins Hives  . Sulfa Antibiotics Hives   Patient Measurements: Height: 4\' 11"  (149.9 cm) (4.11) Weight: 129 lb 13.6 oz (58.9 kg) IBW/kg (Calculated) : 43.2  Adjusted Body Weight: 50.4  Vital Signs: Temp: 99.6 F (37.6 C) (09/16 0800) Temp src: Oral (09/16 0800) BP: 167/55 mmHg (09/16 1100) Pulse Rate: 76  (09/16 1100) Intake/Output from previous day: 09/15 0701 - 09/16 0700 In: 2354 [I.V.:990; IV Piggyback:214; TPN:1150] Out: 4520 [Urine:4450; Stool:70]  Labs:  [facility] 12/08/11 0515 12/07/11 0645 12/06/11 0400  WBC 12.1* 15.6* 15.5*  HGB 8.6* 8.1* 8.3*  HCT 25.8* 24.4* 25.2*  PLT 295 319 335  APTT -- -- --  INR -- -- --    Basename 12/08/11 0515 12/07/11 0645 12/06/11 2027 12/06/11 0400  NA 139 140 139 --  K 2.5* 3.0* 3.0* --  CL 103 106 105 --  CO2 29 27 27  --  GLUCOSE 110* 140* 154* --  BUN 20 22 24* --  CREATININE 0.32* 0.36* 0.40* --  LABCREA -- -- -- --  CREAT24HRUR -- -- -- --  CALCIUM 7.5* 7.6* 7.7* --  MG 1.7 -- -- 2.1  PHOS 1.8* -- -- 2.9  PROT 4.2* 4.3* -- --  ALBUMIN 1.5* 1.7* -- --  AST 17 17 -- --  ALT 11 12 -- --  ALKPHOS 166* 189* -- --  BILITOT 0.4 0.5 -- --  BILIDIR -- -- -- --  IBILI -- -- -- --  PREALBUMIN -- -- -- --  TRIG 37 -- -- --  CHOLHDL -- -- -- --  CHOL 105 -- -- --   Estimated Creatinine Clearance: 46 ml/min (by C-G formula based on Cr of 0.32).    Basename 12/08/11 1150 12/08/11 0741 12/08/11 0355  GLUCAP 179* 108* 139*   Insulin requirements past 24 hours:   Resistant SSI Q4h: 7 units given  Regular insulin in TNA ZOX:WRUEAVW bag = none, removed 9/13  Nutritional Goals:   RD recs 12/02/11 re-estimated needs 1118 kcal, 80-90 gm protein  Current nutrition:   Diet: NPO  Tube feeds: Held, patient pulled NG  tube.  Plan goal TNA rate E 5/15 at 70 ml/hr to deliver 84 gm protein/avg 1398 kcal/day.  IVF: NS at [facility] eval pending  Assessment:   78 yo F admit on 8/30 w/ ruptured sigmoid diverticulum, peritonitis, and pelvic abscess. S/p laparotomy 8/31.  TNA started 9/4 for prolonged ileus.  B-Cell Lymphoma currently receiving chemotherapy.   Surgery resumed TF after trach 9/13 and patient pulled out N/G tube.  CBGs: acceptable  Renal/hepatic function:  WNL (alk phos down, only slightly elevated)  Electrolytes: K low, MD has ordered K runs. Corrected Ca WNL.  LFT's: Alk Phos elevated, others wnl  Pre-Albumin: 11.7 (9/4), 22.6 (9/9), today's value pending  TG/Cholesterol: wnl 9/8  Plan:  At 1800 tonight Continue Clinimix E 5/15 at 50 ml/hr, f/u swall eval results and plan for nutrition Continue current resistant SSI. Plan is to hopefully reinsert NG for enteral feeds, will hold off on advancement of TNA for now TNA to contain standard multivitamins and trace elements; and Lipid infusion on MWF only due to ongoing shortage Continue IVF at [facility] TNA lab panels on Mondays & Thursdays.  Gwen Her PharmD  [phone] 12/08/2011 2:09 PM

## 2011-12-08 NOTE — Evaluation (Signed)
Physical Therapy Evaluation Patient Details Name: [name] MRN: [id number] DOB: 03-11-1932 Today's Date: 12/08/2011 Time: 1421-1450 PT Time Calculation (min): 29 min  PT Assessment / Plan / Recommendation Clinical Impression  Pt presents with perforation of colon and pelvic abcess s/o exploratory laparotomy and draining of pelvic abcess.  Pt now with trach collar set up with 28% and 6L O2.  Tolerated bed mobility, sit <> stand and a stand pivot transfer from bed to chair, however noted pt to have increased secretions and pt to have generalized weakness with diffuse muscle atrophy.  Pt will benefit from skilled PT in acute venue to address deficits.  PT recommends LTACH (per chart) vs SNF and will require 24/7 assist/supervision.      PT Assessment  Patient needs continued PT services    Follow Up Recommendations  Skilled nursing facility;LTACH;Supervision/Assistance - 24 hour    Barriers to Discharge Decreased caregiver support Pts family unable to provide level of care required at this time.     Equipment Recommendations  Defer to next venue    Recommendations for Other Services     Frequency Min 3X/week    Precautions / Restrictions Precautions Precautions: Fall Restrictions Weight Bearing Restrictions: No   Pertinent Vitals/Pain No pain      Mobility  Bed Mobility Bed Mobility: Supine to Sit;Sitting - Scoot to Edge of Bed Supine to Sit: 1: +2 Total assist;HOB elevated Supine to Sit: Patient Percentage: 20% Sitting - Scoot to Edge of Bed: 1: +2 Total assist Sitting - Scoot to Edge of Bed: Patient Percentage: 20% Details for Bed Mobility Assistance: Requires assist for B LEs out of bed and for trunk to attain sitting position.  Max verbal cuing and encouragement to attend to task and for hand placement to self assist with sitting up.  Transfers Transfers: Sit to Stand;Stand to Sit;Stand Pivot Transfers Sit to Stand: 1: +2 Total assist;From elevated surface;With  upper extremity assist;From bed Sit to Stand: Patient Percentage: 30% Stand to Sit: 1: +2 Total assist;With upper extremity assist;With armrests;To chair/3-in-1 Stand to Sit: Patient Percentage: 30% Stand Pivot Transfers: 1: +2 Total assist Stand Pivot Transfers: Patient Percentage: 30% Details for Transfer Assistance: Assist to rise and maintain upright stance throughout mobility, to weight shift and advance LEs with max cuing for hand placement, sequencing/technique with RW.  Ambulation/Gait Ambulation/Gait Assistance: Other (comment) Assistive device: Rolling walker Stairs: No Wheelchair Mobility Wheelchair Mobility: No    Exercises     PT Diagnosis: Difficulty walking;Generalized weakness  PT Problem List: Decreased strength;Decreased activity tolerance;Decreased balance;Decreased mobility;Decreased knowledge of use of DME;Decreased safety awareness;Cardiopulmonary status limiting activity PT Treatment Interventions: DME instruction;Gait training;Functional mobility training;Therapeutic activities;Therapeutic exercise;Balance training;Patient/family education   PT Goals Acute Rehab PT Goals PT Goal Formulation: With patient/family Time For Goal Achievement: 12/22/11 Potential to Achieve Goals: Fair Pt will go Supine/Side to Sit: with mod assist PT Goal: Supine/Side to Sit - Progress: Goal set today Pt will go Sit to Supine/Side: with mod assist PT Goal: Sit to Supine/Side - Progress: Goal set today Pt will go Sit to Stand: with max assist PT Goal: Sit to Stand - Progress: Goal set today Pt will go Stand to Sit: with max assist PT Goal: Stand to Sit - Progress: Goal set today Pt will Transfer Bed to Chair/Chair to Bed: with max assist PT Transfer Goal: Bed to Chair/Chair to Bed - Progress: Goal set today Pt will Ambulate: 1 - 15 feet;with max assist;with least restrictive assistive device PT Goal: Ambulate -  Progress: Goal set today  Visit Information  Last PT Received On:  12/08/11 Assistance Needed: +2    Subjective Data  Subjective: Pt only able to answer to yes/no questions due to trach collar Patient Stated Goal: n/a   Prior Functioning  Home Living Lives With: Significant other Available Help at Discharge: Other (Comment) (LTACH) Type of Home: House Home Adaptive Equipment: Straight cane;Walker - rolling Additional Comments: Pt was getting around with a cane PTA Prior Function Level of Independence: Independent with assistive device(s) Communication Communication: Tracheostomy    Cognition  Overall Cognitive Status: Difficult to assess Difficult to assess due to: Tracheostomy Arousal/Alertness: Lethargic Orientation Level: Appears intact for tasks assessed Behavior During Session: [facility] for tasks performed    Extremity/Trunk Assessment Right Lower Extremity Assessment RLE ROM/Strength/Tone: Deficits RLE ROM/Strength/Tone Deficits: Generalized weakness, grossly 3/5 per functional assessment.  RLE Sensation: WFL - Light Touch Left Lower Extremity Assessment LLE ROM/Strength/Tone: Deficits LLE ROM/Strength/Tone Deficits: Generalized weakness, grossly 3/5 perfunctional assessment.  LLE Sensation: WFL - Light Touch Trunk Assessment Trunk Assessment: Normal   Balance Balance Balance Assessed: Yes Static Sitting Balance Static Sitting - Balance Support: Bilateral upper extremity supported;Feet supported Static Sitting - Level of Assistance: 3: Mod assist Static Sitting - Comment/# of Minutes: Pt sat EOB x 8-9 mins at mod assist with max cuing for hand placement on each side of trunk to self support.  Pt with posterior leaning.   End of Session PT - End of Session Equipment Utilized During Treatment: Gait belt Activity Tolerance: Patient limited by fatigue;Treatment limited secondary to medical complications (Comment) Patient left: in chair;with call bell/phone within reach;with family/visitor present Nurse Communication: Mobility status  GP      Page, Meribeth Mattes 12/08/2011, 3:40 PM

## 2011-12-08 NOTE — Progress Notes (Signed)
eLink Physician-Brief Progress Note Patient Name: [name] DOB: 01-13-1932 MRN: [id number]  Date of Service  12/08/2011   HPI/Events of Note   Hypokalemia.  eICU Interventions  KCl 40 meq IV, please order recheck, was 2.3.      Shey Yott 12/08/2011, 6:58 AM

## 2011-12-08 NOTE — Evaluation (Signed)
[facility] - Evaluation Patient Details  Name: [name] MRN: [id number] Date of Birth: 09-22-1931  Today's Date: 12/08/2011 Time: 4098-1191 SLP Time Calculation (min): 53 min  Past Medical History:  Past Medical History  Diagnosis Date  . Cancer 07/08/11    follicular B cell lymphoma  . TIA (transient ischemic attack)   . Arthritis   . Hypertension   . Esophageal abnormality     strictures  . Arthritis 09/04/2011  . Dehydration 11/20/2011  . Diarrhea 11/20/2011  . Complication of anesthesia   . PONV (postoperative nausea and vomiting)     in the 1980's  . GERD (gastroesophageal reflux disease)   . Esophageal stricture     x 2   Past Surgical History:  Past Surgical History  Procedure Date  . Appendectomy   . Parotidectomy w/ neck dissection total   . Tonsillectomy   . Abdominal hysterectomy     left ovaries  . Laparotomy 11/22/2011    Procedure: EXPLORATORY LAPAROTOMY;  Surgeon: Velora Heckler, MD;  Location: [facility];  Service: General;  Laterality: N/A;  drainage of pelvic abscess  . Colostomy revision 11/22/2011    Procedure: COLON RESECTION SIGMOID;  Surgeon: Velora Heckler, MD;  Location: [facility];  Service: General;  Laterality: N/A;  . Colostomy 11/22/2011    Procedure: COLOSTOMY;  Surgeon: Velora Heckler, MD;  Location: [facility];  Service: General;  Laterality: N/A;  desending colostomy   HPI:   78 yo female admitted 11/21/2011 following several days of abdominal pain.  Found to have perforated sigmoid diverticulum with peritonitis, and pelvic abscess.  Had laparotomy 8/31.  Developed respiratory failure and shock post-op.  PCCM consulted 8/31.  She has hx of B cell lymphoma s/p 4 cycles of CVP- rituxan last 8/13    Assessment / Plan / Recommendation Clinical Impression  Patient appears quite deconditioned and fatigued at this time, however, she was ablte to tolerate cuff deflation and PMSV placement for 40-60 minutes.  Patient was also able to cough and  orally expectorate secretions for oral suction.  Patient did not obtain phonation, even with max cues, however, occassional phonation was heard with cough.  Patient appeared tense and anxious during this eval., but family was supportive and encouraging.  Patient is appropriate for PMSV trials (up to 2 hours if alert and VSS).  Phonation may be a good indicator for readiness for MBS.      SLP Assessment  Patient needs continued Speech Lanaguage Pathology Services    Follow Up Recommendations  Skilled Nursing facility;24 hour supervision/assistance    Frequency and Duration min 3x week  2 weeks   Pertinent Vitals/Pain n/a    SLP Goals Potential to Achieve Goals: Good Progress/Goals/Alternative treatment plan discussed with pt/caregiver and they: Agree SLP Goal #1: Patient will tolerate PMSV for 2 hours with all VSS. SLP Goal #2: Patient will cough and expectorate secretions orally with PMSV in place. SLP Goal #3: Patient will obtain phonation for short phrases to express basic needs with max cues. SLP Goal #4: Patient will complete MBS with PMSV in place.   PMSV Trial  PMSV was placed for: 40 minutes Able to redirect subglottic air through upper airway: Yes Able to Attain Phonation: No (Slight phonation with cough) Voice Quality: Aphonic Able to Expectorate Secretions: Yes Level of Secretion Expectoration with PMSV: Oral Breath Support for Phonation: Moderately decreased Intelligibility: Unable to assess (comment) Respirations During Trial: 30  SpO2 During Trial: 100 % Pulse  During Trial: 94  Behavior: Alert;Anxious;Trembling;Tense   Tracheostomy Tube       Vent Dependency  FiO2 (%): 28 %    Cuff Deflation Trial Tolerated Cuff Deflation: Yes (Initially coughing out secretions.) Length of Time for Cuff Deflation Trial: 10 minutes Behavior: Alert;Anxious;Tense;Trembling   Maryjo Rochester T 12/08/2011, 4:11 PM

## 2011-12-08 NOTE — Progress Notes (Signed)
16 Days Post-Op  Subjective: Very anxious, especially about going to LTAC Denies abdominal pain  Objective: Vital signs in last 24 hours: Temp:  [99.1 F (37.3 C)-101.4 F (38.6 C)] 99.6 F (37.6 C) (09/16 0800) Pulse Rate:  [29-109] 85  (09/16 0900) Resp:  [18-25] 22  (09/16 0900) BP: (109-154)/(39-93) 133/75 mmHg (09/16 0700) SpO2:  [93 %-100 %] 99 % (09/16 0900) FiO2 (%):  [28 %-35 %] 28 % (09/16 0736) Last BM Date:  (has a colostomy)  Intake/Output from previous day: 09/15 0701 - 09/16 0700 In: 2354 [I.V.:990; IV Piggyback:214; TPN:1150] Out: 4520 [Urine:4450; Stool:70] Intake/Output this shift:    Abdomen soft, ostomy working well Midline stable  Lab Results:   Basename 12/08/11 0515 12/07/11 0645  WBC 12.1* 15.6*  HGB 8.6* 8.1*  HCT 25.8* 24.4*  PLT 295 319   BMET  Basename 12/08/11 0515 12/07/11 0645  NA 139 140  K 2.5* 3.0*  CL 103 106  CO2 29 27  GLUCOSE 110* 140*  BUN 20 22  CREATININE 0.32* 0.36*  CALCIUM 7.5* 7.6*   PT/INR No results found for this basename: LABPROT:2,INR:2 in the last 72 hours ABG  Basename 12/06/11 0408  PHART 7.508*  HCO3 26.7*    Studies/Results: Ct Chest Wo Contrast  12/06/2011  *RADIOLOGY REPORT*  Clinical Data:  Subcutaneous emphysema over the chest and free intraperitoneal gas in the abdomen.  CHEST CT WITHOUT CONTRAST  Technique:  Multidetector CT imaging of the chest was performed following the standard protocol without intravenous contrast.  Comparison:  11/28/2011.  11/11/2011.  Findings:  Extensive soft tissue emphysema is present involving the left chest wall musculature, left breast, and subcutaneous tissues. This is primarily anterior and lateral.  No evidence of pneumothorax.  Left upper extremity PICC tip is at the cavoatrial junction. Stable right internal jugular vein Port-A-Cath.  No evidence of mediastinal hemorrhage or emphysema.  Negative abnormal mediastinal adenopathy.  Tracheostomy tube is in place. The  esophagus is mildly dilated with gas in the upper half of the thorax.  Hiatal hernia is noted distally.  Moderate bilateral pleural effusions are present.  Bibasilar atelectasis verses airspace disease in the dependent portion of both lungs.  No acute bony deformity.  Air-fluid level in the trachea above the tracheostomy tube.  IMPRESSION: Large amount of emphysema overlying the soft tissues of the left chest wall without evidence of pneumomediastinum or pneumothorax.  Bilateral pleural effusions and bibasilar atelectasis worse airspace disease.  CT ABDOMEN AND PELVIS WITH CONTRAST  Technique:  Multidetector CT imaging of the abdomen and pelvis was performed using the standard protocol following bolus administration of intravenous contrast.  Contrast: OMNIPAQUE IOHEXOL 300 MG/ML  SOLN  Findings:  Lack of oral contrast limits examination.  Free intraperitoneal gas is seen anterior to the left lobe of the liver as well as adjacent to the gallbladder, anterior bowel loops, and the stomach.  Small hypodensities towards the dome of the liver are stable.  Spleen, adrenal glands, pancreas are within normal limits. Kidneys are stable in appearance. Gallstones are not clearly identified.  At the site of the left lower quadrant colostomy there is air and fluid about the colostomy site as well as surrounding small bowel loops extending into the pelvis.  A large amount of gas is seen extending into the subcutaneous tissue at the colostomy site and extending inferiorly.  Gas is present within the fat extending through the abdominal wall defect created for the colostomy.  Air- fluid levels in  the subcutaneous fat adjacent to the colostomy site are present. Colonic mucosa within the colostomy site is also ill- defined and hazy. Inflammatory process is not excluded.  Air can be seen passing through the abdominal wall defect.  Foley catheter is present in the bladder.  The surgical incision has been opened to heal by secondary  intention.  Hartmann's pouch is within normal limits.  IMPRESSION: There is a large amount of free intraperitoneal gas in the left lower quadrant and scattered free intraperitoneal gas in the upper abdomen.  Air-fluid level in the peritoneal space is present at the left lower quadrant.  There is also a large amount of emphysema over the left abdominal wall in the overlying soft tissues.  Air can be seen traversing through the abdominal wall fixed defect at the colostomy.  These findings may simply represent a air extending from the subcutaneous tissues of the chest across the abdomen and through the wall defect into the peritoneal space as one would expect with barotrauma from lung injury.  Ruptured bowel with subsequent infection and gas-forming organism cannot be excluded. Please note that oral contrast was not administered on this examination.  Air-fluid level in the subcutaneous fat adjacent to the colostomy is also noted.  Once again, inflammatory process is not excluded. Critical Value/emergent results were called by telephone at the time of interpretation on 12/06/2011 at 1200 to Dr. Vassie Loll, who verbally acknowledged these results.   Original Report Authenticated By: Donavan Burnet, M.D.    Ct Abdomen Pelvis W Contrast  12/06/2011  *RADIOLOGY REPORT*  Clinical Data:  Subcutaneous emphysema over the chest and free intraperitoneal gas in the abdomen.  CHEST CT WITHOUT CONTRAST  Technique:  Multidetector CT imaging of the chest was performed following the standard protocol without intravenous contrast.  Comparison:  11/28/2011.  11/11/2011.  Findings:  Extensive soft tissue emphysema is present involving the left chest wall musculature, left breast, and subcutaneous tissues. This is primarily anterior and lateral.  No evidence of pneumothorax.  Left upper extremity PICC tip is at the cavoatrial junction. Stable right internal jugular vein Port-A-Cath.  No evidence of mediastinal hemorrhage or emphysema.   Negative abnormal mediastinal adenopathy.  Tracheostomy tube is in place. The esophagus is mildly dilated with gas in the upper half of the thorax.  Hiatal hernia is noted distally.  Moderate bilateral pleural effusions are present.  Bibasilar atelectasis verses airspace disease in the dependent portion of both lungs.  No acute bony deformity.  Air-fluid level in the trachea above the tracheostomy tube.  IMPRESSION: Large amount of emphysema overlying the soft tissues of the left chest wall without evidence of pneumomediastinum or pneumothorax.  Bilateral pleural effusions and bibasilar atelectasis worse airspace disease.  CT ABDOMEN AND PELVIS WITH CONTRAST  Technique:  Multidetector CT imaging of the abdomen and pelvis was performed using the standard protocol following bolus administration of intravenous contrast.  Contrast: OMNIPAQUE IOHEXOL 300 MG/ML  SOLN  Findings:  Lack of oral contrast limits examination.  Free intraperitoneal gas is seen anterior to the left lobe of the liver as well as adjacent to the gallbladder, anterior bowel loops, and the stomach.  Small hypodensities towards the dome of the liver are stable.  Spleen, adrenal glands, pancreas are within normal limits. Kidneys are stable in appearance. Gallstones are not clearly identified.  At the site of the left lower quadrant colostomy there is air and fluid about the colostomy site as well as surrounding small bowel loops extending into  the pelvis.  A large amount of gas is seen extending into the subcutaneous tissue at the colostomy site and extending inferiorly.  Gas is present within the fat extending through the abdominal wall defect created for the colostomy.  Air- fluid levels in the subcutaneous fat adjacent to the colostomy site are present. Colonic mucosa within the colostomy site is also ill- defined and hazy. Inflammatory process is not excluded.  Air can be seen passing through the abdominal wall defect.  Foley catheter is  present in the bladder.  The surgical incision has been opened to heal by secondary intention.  Hartmann's pouch is within normal limits.  IMPRESSION: There is a large amount of free intraperitoneal gas in the left lower quadrant and scattered free intraperitoneal gas in the upper abdomen.  Air-fluid level in the peritoneal space is present at the left lower quadrant.  There is also a large amount of emphysema over the left abdominal wall in the overlying soft tissues.  Air can be seen traversing through the abdominal wall fixed defect at the colostomy.  These findings may simply represent a air extending from the subcutaneous tissues of the chest across the abdomen and through the wall defect into the peritoneal space as one would expect with barotrauma from lung injury.  Ruptured bowel with subsequent infection and gas-forming organism cannot be excluded. Please note that oral contrast was not administered on this examination.  Air-fluid level in the subcutaneous fat adjacent to the colostomy is also noted.  Once again, inflammatory process is not excluded. Critical Value/emergent results were called by telephone at the time of interpretation on 12/06/2011 at 1200 to Dr. Vassie Loll, who verbally acknowledged these results.   Original Report Authenticated By: Donavan Burnet, M.D.    Dg Chest Port 1 View  12/08/2011  *RADIOLOGY REPORT*  Clinical Data: Emphysema.  PORTABLE CHEST - 1 VIEW  Comparison: CT chest 12/06/2011 and plain film chest 12/07/2011.  Findings: Support tubes lines are unchanged.  Bilateral pleural effusions persist.  Subcutaneous emphysema over the left chest is again identified. No pneumothorax is identified.  Compressive atelectasis is noted.  IMPRESSION: No interval change in bilateral pleural effusions and compressive atelectasis.   Original Report Authenticated By: Bernadene Bell. Maricela Curet, M.D.    Dg Chest Port 1 View  12/07/2011  *RADIOLOGY REPORT*  Clinical Data: Subcutaneous emphysema  PORTABLE  CHEST - 1 VIEW  Comparison: 12/06/2011  Findings: Tracheostomy tube, Port-A-Cath, left arm PICC are stable. Emphysema over the left chest wall is stable.  No definite pneumothorax.  Free intraperitoneal gas is unchanged.  Mild haziness at the lung bases is stable.  IMPRESSION: No pneumothorax.  No significant change.   Original Report Authenticated By: Donavan Burnet, M.D.     Anti-infectives: Anti-infectives     Start     Dose/Rate Route Frequency Ordered Stop   12/01/11 2200   vancomycin (VANCOCIN) 500 mg in sodium chloride 0.9 % 100 mL IVPB  Status:  Discontinued        500 mg 100 mL/hr over 60 Minutes Intravenous Every 12 hours 12/01/11 1035 12/03/11 1159   12/01/11 1200   vancomycin (VANCOCIN) 1,250 mg in sodium chloride 0.9 % 250 mL IVPB        1,250 mg 166.7 mL/hr over 90 Minutes Intravenous  Once 12/01/11 1035 12/01/11 1305   11/29/11 1000   aztreonam (AZACTAM) 2 g in dextrose 5 % 50 mL IVPB  Status:  Discontinued        2  g 100 mL/hr over 30 Minutes Intravenous Every 8 hours 11/29/11 0907 12/07/11 0913   11/28/11 1200   aztreonam (AZACTAM) 2 g in dextrose 5 % 50 mL IVPB  Status:  Discontinued        2 g 100 mL/hr over 30 Minutes Intravenous Every 12 hours 11/28/11 0240 11/28/11 0903   11/28/11 1000   fluconazole (DIFLUCAN) IVPB 200 mg  Status:  Discontinued        200 mg 100 mL/hr over 60 Minutes Intravenous Every 24 hours 11/28/11 0915 12/03/11 1159   11/28/11 0200   micafungin (MYCAMINE) 100 mg in sodium chloride 0.9 % 100 mL IVPB  Status:  Discontinued     Comments: Pharmacy to verify the dose      100 mg 100 mL/hr over 1 Hours Intravenous Daily at bedtime 11/28/11 0133 11/28/11 0903   11/28/11 0130   aztreonam (AZACTAM) 2 g in dextrose 5 % 50 mL IVPB        2 g 100 mL/hr over 30 Minutes Intravenous  Once 11/28/11 0130 11/28/11 0230   11/23/11 0300   fluconazole (DIFLUCAN) IVPB 200 mg  Status:  Discontinued        200 mg 100 mL/hr over 60 Minutes Intravenous Every 24  hours 11/22/11 0254 11/26/11 1442   11/22/11 0300   fluconazole (DIFLUCAN) IVPB 400 mg        400 mg 200 mL/hr over 60 Minutes Intravenous  Once 11/22/11 0254 11/22/11 0518   11/22/11 0230   metroNIDAZOLE (FLAGYL) IVPB 500 mg  Status:  Discontinued        500 mg 100 mL/hr over 60 Minutes Intravenous  Once 11/22/11 0229 11/22/11 0230   11/21/11 2230   ciprofloxacin (CIPRO) IVPB 400 mg  Status:  Discontinued        400 mg 200 mL/hr over 60 Minutes Intravenous 2 times daily 11/21/11 2207 11/29/11 0840   11/21/11 2230   metroNIDAZOLE (FLAGYL) IVPB 500 mg  Status:  Discontinued        500 mg 100 mL/hr over 60 Minutes Intravenous 3 times per day 11/21/11 2207 12/07/11 0913   11/21/11 2100   meropenem (MERREM) 1 g in sodium chloride 0.9 % 100 mL IVPB  Status:  Discontinued        1 g 200 mL/hr over 30 Minutes Intravenous 3 times per day 11/21/11 2013 11/21/11 2146          Assessment/Plan: s/p Procedure(s) (LRB) with comments: EXPLORATORY LAPAROTOMY (N/A) - drainage of pelvic abscess COLON RESECTION SIGMOID (N/A) COLOSTOMY (N/A) - desending colostomy  Swallowing evaluation pending Ready soon for LTAC from gen surg standpoint  LOS: 17 days    Malorie Bigford A 12/08/2011

## 2011-12-08 NOTE — Progress Notes (Addendum)
Name: [name] MRN: [id number] DOB: [birth year]    LOS: 17  Referring Provider:  Dr. Gerrit Friends  Reason for Referral:  Septic shock  PULMONARY / CRITICAL CARE MEDICINE  HPI:  78 yo female admitted 11/21/2011 following several days of abdominal pain.  Found to have perforated sigmoid diverticulum with peritonitis, and pelvic abscess.  Had laparotomy 8/31.  Developed respiratory failure and shock post-op.  PCCM consulted 8/31.  She has hx of B cell lymphoma s/p 4 cycles of CVP- rituxan last 8/13   Events Since Admission: 8/31 Laparotomy, sigmoid colectomy and ostomy 9/01 A fib with RVR 9/02 Off pressors 9/06 A fib RVR>>back to sinus; added precedex for agitation; hypotensive.  Rt thoracentesis (transudate). 9/9 Reintubated. 9/14 XR shows free air in abdomen & sub cut emphysema CT abdomen >> Large amount of emphysema overlying the soft tissues of the left chest wall without evidence of pneumomediastinum or pneumothorax. Bilateral pleural effusions and bibasilar atelectasis worse airspace disease. large amount of free intraperitoneal gas in the left lower quadrant and scattered free intraperitoneal gas in the upper  abdomen. Air-fluid level in the peritoneal space is present at the left lower quadrant. There is also a large amount of emphysema over the left abdominal wall in the overlying soft tissues   Subjective:  - does not want NG reinserted Febrile - Tm 101, tolerated ATC well Denies pain  Vital Signs: Temp:  [99.1 F (37.3 C)-101.4 F (38.6 C)] 99.6 F (37.6 C) (09/16 0800) Pulse Rate:  [29-109] 29  (09/16 0600) Resp:  [18-25] 22  (09/16 0600) BP: (109-154)/(39-93) 109/93 mmHg (09/16 0600) SpO2:  [93 %-100 %] 99 % (09/16 0736) FiO2 (%):  [28 %-35 %] 28 % (09/16 0736)  Intake/Output Summary (Last 24 hours) at 12/08/11 0845 Last data filed at 12/08/11 0600  Gross per 24 hour  Intake   2304 ml  Output   4520 ml  Net  -2216 ml   Physical Examination: General: Restless,  trying to mouth words, appears in mild distress Neuro:, moves extremities, follows commands well HEENT: trach sutured,  mucus membranes intact but dry. Neck:  Supple Cardiovascular: s1s2 regular, no murmur Lungs: tachypnea, clear but decreased breath sounds Abdomen:  Wound dressings clean. Stoma red with air in bag.  Hypoactive bowel sounds, soft, non tender Musculoskeletal:  No edema, moves ext to command Skin:  Midline incision and Left sided colostomy placement, other wise intact. No rashes.   Crepitus over chest wall & LUE -decreased  ASSESSMENT AND PLAN  PULMONARY  Lab 12/06/11 0408 12/05/11 0420 12/04/11 0455 12/03/11 0630 12/03/11 0454  PHART 7.508* 7.509* 7.583* 7.581* 7.614*  PCO2ART 33.9* 39.0 36.0 34.7* 32.2*  PO2ART 115.0* 114.0* 113.0* 125.0* 140.0*  HCO3 26.7* 30.8* 34.1* 32.8* 32.9*  O2SAT 99.4 99.5 99.5 99.6 99.3   CXR:  9/16 no change in Emphysema over the left chest wall . No definite pneumothorax. Free intraperitoneal gas is resolved   Ventilator Settings: ATC   ETT:  8/31 >> 9/8 >>> 9/9 >>>9/13 Trach 9/13 >>  A: 1) Acute respiratory failure in setting of septic shock from peritonitis. Failed Extubation on 9/8. Clinically stable - hence free air & sub cut emphysema appear trach related rather than new bowel perf or pnthx    P:    ATC as tolerated Swallow evaln PT   CARDIOVASCULAR  Lab 12/05/11 0240  TROPONINI <0.30  LATICACIDVEN 1.2  PROBNP --   Lines:  Lt Grant CVL 8/31 >> out LUE PICC 9/13 >>  Echo 9/01>>periapical akinesis, EF 25 to 30%, diffuse hypokinesis, grade 1 diastolic dysfx, mod AR, mild MR, mod TR, mild/mod RV systolic dysfx, PAS 37 mmHg  A:  1) Septic shock 2nd to peritonitis>>resolved, now HTN. 2) A fib with RVR>>back in sinus 9/02 and remained. 3) Acute systolic CHF>>?if this is sepsis related cardiomyopathy. 4) Hypotension 9/06 more likely related to diuresis, sedation and lopressor use rather than recurrent sepsis    P:  Changed to Lopressor 5 q 12h Resume plavix at some point  RENAL  Lab 12/08/11 0515 12/07/11 0645 12/06/11 2027 12/06/11 0400 12/05/11 0240 12/04/11 0510 12/03/11 1700 12/03/11 0440  NA 139 140 139 139 137 -- -- --  K 2.5* 3.0* -- -- -- -- -- --  CL 103 106 105 103 100 -- -- --  CO2 29 27 27 29  34* -- -- --  BUN 20 22 24* 30* 26* -- -- --  CREATININE 0.32* 0.36* 0.40* 0.53 0.45* -- -- --  CALCIUM 7.5* 7.6* 7.7* 7.7* 7.1* -- -- --  MG 1.7 -- -- 2.1 1.8 1.8 2.1 --  PHOS 1.8* -- -- 2.9 2.8 3.0 -- 3.6   Intake/Output      09/15 0701 - 09/16 0700 09/16 0701 - 09/17 0700   I.V. (mL/kg) 990 (16.8)    IV Piggyback 214    TPN 1150    Total Intake(mL/kg) 2354 (40)    Urine (mL/kg/hr) 4450 (3.1)    Stool 70    Total Output 4520    Net -2166          Foley:  Placed 11/21/2011  A: Hypokalemia, hypomagnesemia, hypophosphatemia P:   Replace k phos Monitor renal fx, urine outpt Hold lasix until K replaced Dc Diamox for lasix induced alkalosis.  GASTROINTESTINAL  Lab 12/08/11 0515 12/07/11 0645 12/05/11 0240 12/04/11 0510  AST 17 17 18 15   ALT 11 12 12 10   ALKPHOS 166* 189* 183* 133*  BILITOT 0.4 0.5 0.3 0.3  PROT 4.2* 4.3* 4.0* 3.9*  ALBUMIN 1.5* 1.7* 1.5* 1.5*   A:  Perforated sigmoid diverticulum s/p laparotomy>>CT abd/pelvis 9/06 not impressive for abscess. Surgery is following.  Had swallow issues -required esophageal dilation -Buccini   P:   Post-op care per CCS. TF started 9/08 per CCS- on hold since ng out Remain concerned about possible pelvic abscess LFTs periodically while on TNA  HEMATOLOGIC  Lab 12/08/11 0515 12/07/11 0645 12/06/11 0400 12/05/11 0240 12/04/11 0510  HGB 8.6* 8.1* 8.3* 9.7* 7.9*  HCT 25.8* 24.4* 25.2* 29.1* 23.5*  PLT 295 319 335 286 302  INR -- -- -- 1.10 --  APTT -- -- -- 25 --   A:  1)  Follicular B cell lymphoma, currently undergoing chemotherapy. 2)  Anemia of critical illness, and chronic disease. 3)  Thrombocytopenia likely related  to sepsis/critical illness>>improved. P:  F/u CBC Transfuse for Hb < 7   INFECTIOUS  Lab 12/08/11 0515 12/07/11 0645 12/06/11 0400 12/05/11 0240 12/04/11 0510  WBC 12.1* 15.6* 15.5* 14.9* 14.8*  PROCALCITON -- 0.10 -- -- --   Cultures: Blood 8/31 >>negative Urine 8/31 >>negative Sputum 9/05 >>Oral flora Rt pleural fluid 9/06>>neg Urine 9/06: E coli (pansens) C diff 9/06>>negative Blood 9/06>> no growth Urine culture 9/11>>ng  Antibiotics: Cipro 8/31 >>9/06 Flagyl 8/31 (peritonitis)>>9/15 Fluconazole 8/31(peritonitis) >>9/13 Aztreonam 9/07>>9/15 Vancomycin 9/9>>9/11  A:   1) Peritonitis 2nd to sigmoid perforation>>improved. 2) Possible HCAP (no organisms specified)  3) leukocytosis>>she is getting solu cortef, WBCs trending down  E  coli in urine cx 9/06.  Hx of PCN allergy.  Febrile but WC decreasing & pct low P:   Observe off abx  .  ENDOCRINE  Lab 12/08/11 0741 12/08/11 0355 12/08/11 0022 12/07/11 2007 12/07/11 1635  GLUCAP 108* 139* 111* 133* 130*   A:  Hyperglycemia, on chronic steroids P:  Blood sugar monitoring Q4H and SSI Stress dose steroids   NEUROLOGIC  A:  Anxiety/ acute encephalopathy  most likely ventilator related.   P:   Dc  Seroquel  -  Dc versed fent for pain PT consult   LTAC placement , PEG deferred until swallow prognosis clearer  Care during the described time interval was provided by me and/or other providers on the critical care team.  I have reviewed this patient's available data, including medical history, events of note, physical examination and test results as part of my evaluation  CC time x 32 m  Cyril Mourning MD. Tonny Bollman. [facility] Pager [phone] If no response call [phone]

## 2011-12-08 NOTE — Progress Notes (Signed)
CARE MANAGEMENT NOTE 12/08/2011  Patient:  [name], [name]   Account Number:  [id number]  Date Initiated:  11/24/2011  Documentation initiated by:  DAVIS,RHONDA  Subjective/Objective Assessment:   pt with perforated bowel requiring open lap withformation of colostomy s/p colon resection, icu-vented [id number], now septic and on vaso pressors     Action/Plan:   lives at home with family   Anticipated DC Date:  12/11/2011   Anticipated DC Plan:  LONG TERM ACUTE CARE (LTAC)  In-house referral  NA      DC Planning Services  CM consult      PAC Choice  NA   Choice offered to / List presented to:  NA   DME arranged  NA      DME agency  NA     HH arranged  NA      HH agency  NA   Status of service:  In process, will continue to follow Medicare Important Message given?  NA - LOS <3 / Initial given by admissions (If response is "NO", the following Medicare IM given date fields will be blank) Date Medicare IM given:   Date Additional Medicare IM given:    Discharge Disposition:    Per UR Regulation:  Reviewed for med. necessity/level of care/duration of stay  If discussed at Long Length of Stay Meetings, dates discussed:   12/02/2011  12/04/2011    Comments:  [id number] Earlene Plater, RN, BSN, CCM: CHART REVIEWED AND UPDATED. pLAN IS FOR ltac ONCE SWALLOW TESTING IS COMPLETE. CASE MANAGEMENT [phone]   [id number] Davis,Rn,BSN,CCM: vent day 3, plans is for tracheostomy am of [id number]/ referral for ltac done.  Rep. notifed. [id number]/SEGBTD Davis,Rn,BSn,CCM patient was intubated am on [id number]: vent day 1 was extubated on [id number]  Scarlette Slice, RN, BSN, CCM: CHART REVIEWED AND UPDATED. NO DISCHARGE NEEDS PRESENT AT THIS TIME. CASE MANAGEMENT [phone]

## 2011-12-09 ENCOUNTER — Other Ambulatory Visit (HOSPITAL_COMMUNITY): Payer: Medicare Other

## 2011-12-09 ENCOUNTER — Inpatient Hospital Stay
Admission: AD | Admit: 2011-12-09 | Discharge: 2012-01-14 | Disposition: A | Discharge status: Skilled Nursing Facility | Payer: Medicare Other | Source: Ambulatory Visit | Attending: Internal Medicine | Admitting: Internal Medicine

## 2011-12-09 DIAGNOSIS — L02219 Cutaneous abscess of trunk, unspecified: Secondary | ICD-10-CM | POA: Diagnosis not present

## 2011-12-09 DIAGNOSIS — Z9049 Acquired absence of other specified parts of digestive tract: Secondary | ICD-10-CM | POA: Diagnosis not present

## 2011-12-09 DIAGNOSIS — C8589 Other specified types of non-Hodgkin lymphoma, extranodal and solid organ sites: Secondary | ICD-10-CM | POA: Diagnosis not present

## 2011-12-09 DIAGNOSIS — R112 Nausea with vomiting, unspecified: Secondary | ICD-10-CM | POA: Diagnosis not present

## 2011-12-09 DIAGNOSIS — E876 Hypokalemia: Secondary | ICD-10-CM | POA: Diagnosis not present

## 2011-12-09 DIAGNOSIS — E440 Moderate protein-calorie malnutrition: Secondary | ICD-10-CM | POA: Diagnosis not present

## 2011-12-09 DIAGNOSIS — I5021 Acute systolic (congestive) heart failure: Secondary | ICD-10-CM

## 2011-12-09 DIAGNOSIS — J9600 Acute respiratory failure, unspecified whether with hypoxia or hypercapnia: Secondary | ICD-10-CM

## 2011-12-09 DIAGNOSIS — Z930 Tracheostomy status: Secondary | ICD-10-CM | POA: Diagnosis not present

## 2011-12-09 DIAGNOSIS — J984 Other disorders of lung: Secondary | ICD-10-CM | POA: Diagnosis not present

## 2011-12-09 DIAGNOSIS — R918 Other nonspecific abnormal finding of lung field: Secondary | ICD-10-CM | POA: Diagnosis not present

## 2011-12-09 DIAGNOSIS — K631 Perforation of intestine (nontraumatic): Secondary | ICD-10-CM | POA: Diagnosis not present

## 2011-12-09 DIAGNOSIS — R109 Unspecified abdominal pain: Secondary | ICD-10-CM | POA: Diagnosis not present

## 2011-12-09 DIAGNOSIS — K651 Peritoneal abscess: Secondary | ICD-10-CM | POA: Diagnosis present

## 2011-12-09 DIAGNOSIS — F411 Generalized anxiety disorder: Secondary | ICD-10-CM | POA: Diagnosis not present

## 2011-12-09 DIAGNOSIS — I509 Heart failure, unspecified: Secondary | ICD-10-CM | POA: Diagnosis not present

## 2011-12-09 DIAGNOSIS — R1011 Right upper quadrant pain: Secondary | ICD-10-CM | POA: Diagnosis not present

## 2011-12-09 DIAGNOSIS — K5732 Diverticulitis of large intestine without perforation or abscess without bleeding: Secondary | ICD-10-CM | POA: Diagnosis not present

## 2011-12-09 DIAGNOSIS — J90 Pleural effusion, not elsewhere classified: Secondary | ICD-10-CM | POA: Diagnosis not present

## 2011-12-09 DIAGNOSIS — G8929 Other chronic pain: Secondary | ICD-10-CM | POA: Diagnosis not present

## 2011-12-09 DIAGNOSIS — C8377 Burkitt lymphoma, spleen: Secondary | ICD-10-CM | POA: Diagnosis not present

## 2011-12-09 DIAGNOSIS — E871 Hypo-osmolality and hyponatremia: Secondary | ICD-10-CM | POA: Diagnosis not present

## 2011-12-09 DIAGNOSIS — R1310 Dysphagia, unspecified: Secondary | ICD-10-CM | POA: Diagnosis not present

## 2011-12-09 DIAGNOSIS — K222 Esophageal obstruction: Secondary | ICD-10-CM | POA: Diagnosis not present

## 2011-12-09 DIAGNOSIS — S31109A Unspecified open wound of abdominal wall, unspecified quadrant without penetration into peritoneal cavity, initial encounter: Secondary | ICD-10-CM | POA: Diagnosis not present

## 2011-12-09 DIAGNOSIS — E46 Unspecified protein-calorie malnutrition: Secondary | ICD-10-CM | POA: Diagnosis present

## 2011-12-09 DIAGNOSIS — A498 Other bacterial infections of unspecified site: Secondary | ICD-10-CM | POA: Diagnosis not present

## 2011-12-09 DIAGNOSIS — L03319 Cellulitis of trunk, unspecified: Secondary | ICD-10-CM | POA: Diagnosis not present

## 2011-12-09 DIAGNOSIS — D630 Anemia in neoplastic disease: Secondary | ICD-10-CM | POA: Diagnosis not present

## 2011-12-09 DIAGNOSIS — I4891 Unspecified atrial fibrillation: Secondary | ICD-10-CM | POA: Diagnosis not present

## 2011-12-09 DIAGNOSIS — G4733 Obstructive sleep apnea (adult) (pediatric): Secondary | ICD-10-CM | POA: Diagnosis present

## 2011-12-09 DIAGNOSIS — B3781 Candidal esophagitis: Secondary | ICD-10-CM | POA: Diagnosis not present

## 2011-12-09 DIAGNOSIS — G4700 Insomnia, unspecified: Secondary | ICD-10-CM | POA: Diagnosis not present

## 2011-12-09 DIAGNOSIS — R5381 Other malaise: Secondary | ICD-10-CM | POA: Diagnosis not present

## 2011-12-09 DIAGNOSIS — R1314 Dysphagia, pharyngoesophageal phase: Secondary | ICD-10-CM | POA: Diagnosis not present

## 2011-12-09 DIAGNOSIS — K650 Generalized (acute) peritonitis: Secondary | ICD-10-CM | POA: Diagnosis not present

## 2011-12-09 DIAGNOSIS — J95821 Acute postprocedural respiratory failure: Secondary | ICD-10-CM | POA: Diagnosis present

## 2011-12-09 DIAGNOSIS — L0291 Cutaneous abscess, unspecified: Secondary | ICD-10-CM | POA: Diagnosis not present

## 2011-12-09 DIAGNOSIS — R5383 Other fatigue: Secondary | ICD-10-CM | POA: Diagnosis present

## 2011-12-09 DIAGNOSIS — J9819 Other pulmonary collapse: Secondary | ICD-10-CM | POA: Diagnosis not present

## 2011-12-09 DIAGNOSIS — K560 Paralytic ileus: Secondary | ICD-10-CM | POA: Diagnosis not present

## 2011-12-09 DIAGNOSIS — Z1639 Resistance to other specified antimicrobial drug: Secondary | ICD-10-CM | POA: Diagnosis not present

## 2011-12-09 DIAGNOSIS — N19 Unspecified kidney failure: Secondary | ICD-10-CM | POA: Diagnosis not present

## 2011-12-09 DIAGNOSIS — I10 Essential (primary) hypertension: Secondary | ICD-10-CM | POA: Diagnosis present

## 2011-12-09 DIAGNOSIS — R6520 Severe sepsis without septic shock: Secondary | ICD-10-CM | POA: Diagnosis not present

## 2011-12-09 DIAGNOSIS — Z792 Long term (current) use of antibiotics: Secondary | ICD-10-CM | POA: Diagnosis not present

## 2011-12-09 DIAGNOSIS — I5022 Chronic systolic (congestive) heart failure: Secondary | ICD-10-CM | POA: Diagnosis present

## 2011-12-09 DIAGNOSIS — R0602 Shortness of breath: Secondary | ICD-10-CM | POA: Diagnosis not present

## 2011-12-09 DIAGNOSIS — R197 Diarrhea, unspecified: Secondary | ICD-10-CM | POA: Diagnosis not present

## 2011-12-09 DIAGNOSIS — C8299 Follicular lymphoma, unspecified, extranodal and solid organ sites: Secondary | ICD-10-CM | POA: Diagnosis present

## 2011-12-09 DIAGNOSIS — G8928 Other chronic postprocedural pain: Secondary | ICD-10-CM | POA: Diagnosis not present

## 2011-12-09 DIAGNOSIS — T8140XA Infection following a procedure, unspecified, initial encounter: Secondary | ICD-10-CM | POA: Diagnosis not present

## 2011-12-09 DIAGNOSIS — D709 Neutropenia, unspecified: Secondary | ICD-10-CM | POA: Diagnosis not present

## 2011-12-09 DIAGNOSIS — K449 Diaphragmatic hernia without obstruction or gangrene: Secondary | ICD-10-CM | POA: Diagnosis not present

## 2011-12-09 DIAGNOSIS — Z430 Encounter for attention to tracheostomy: Secondary | ICD-10-CM | POA: Diagnosis not present

## 2011-12-09 DIAGNOSIS — N390 Urinary tract infection, site not specified: Secondary | ICD-10-CM | POA: Diagnosis not present

## 2011-12-09 DIAGNOSIS — G894 Chronic pain syndrome: Secondary | ICD-10-CM | POA: Diagnosis not present

## 2011-12-09 DIAGNOSIS — Z433 Encounter for attention to colostomy: Secondary | ICD-10-CM | POA: Diagnosis not present

## 2011-12-09 DIAGNOSIS — J438 Other emphysema: Secondary | ICD-10-CM | POA: Diagnosis not present

## 2011-12-09 DIAGNOSIS — F329 Major depressive disorder, single episode, unspecified: Secondary | ICD-10-CM | POA: Diagnosis not present

## 2011-12-09 DIAGNOSIS — A419 Sepsis, unspecified organism: Secondary | ICD-10-CM | POA: Diagnosis not present

## 2011-12-09 DIAGNOSIS — M6281 Muscle weakness (generalized): Secondary | ICD-10-CM | POA: Diagnosis not present

## 2011-12-09 LAB — BASIC METABOLIC PANEL
BUN: 21 mg/dL (ref 6–23)
BUN: 23 mg/dL (ref 6–23)
CO2: 26 mEq/L (ref 19–32)
Chloride: 97 mEq/L (ref 96–112)
Chloride: 97 mEq/L (ref 96–112)
Creatinine, Ser: 0.37 mg/dL — ABNORMAL LOW (ref 0.50–1.10)
GFR calc Af Amer: >90 mL/min (ref >90)
GFR calc non Af Amer: >90 mL/min (ref >90)
Potassium: 4.5 mEq/L (ref 3.5–5.1)
Potassium: 2.8 mEq/L — ABNORMAL LOW (ref 3.5–5.1)
Sodium: 137 mEq/L (ref 135–145)

## 2011-12-09 LAB — CBC
HCT: 24.6 % — ABNORMAL LOW (ref 36.0–46.0)
HCT: 26.8 % — ABNORMAL LOW (ref 36.0–46.0)
Hemoglobin: 8.2 g/dL — ABNORMAL LOW (ref 12.0–15.0)
Hemoglobin: 8.9 g/dL — ABNORMAL LOW (ref 12.0–15.0)
MCHC: 33.3 g/dL (ref 30.0–36.0)
MCHC: 33.2 g/dL (ref 30.0–36.0)
MCV: 93.9 fL (ref 78.0–100.0)
RBC: 2.91 MIL/uL — ABNORMAL LOW (ref 3.87–5.11)
RDW: 17.5 % — ABNORMAL HIGH (ref 11.5–15.5)

## 2011-12-09 LAB — GLUCOSE, CAPILLARY: Glucose-Capillary: 129 mg/dL — ABNORMAL HIGH (ref 70–99)

## 2011-12-09 LAB — PHOSPHORUS: Phosphorus: 3.2 mg/dL (ref 2.3–4.6)

## 2011-12-09 MED ORDER — FAT EMULSION 20 % IV EMUL: 240.0000 mL | INTRAVENOUS | Status: DC

## 2011-12-09 MED ORDER — IOHEXOL 300 MG/ML  SOLN
100.0000 mL | Freq: Once | INTRAMUSCULAR | Status: AC | PRN
  Administered 2011-12-09: 100 mL via INTRAVENOUS

## 2011-12-09 MED ORDER — FENTANYL CITRATE 0.05 MG/ML IJ SOLN: 25.0000 ug | INTRAMUSCULAR | Status: DC | PRN

## 2011-12-09 MED ORDER — IOHEXOL 300 MG/ML  SOLN: 20.0000 mL | Freq: Once | INTRAMUSCULAR | Status: AC | PRN

## 2011-12-09 NOTE — Clinical Social Work Note (Signed)
CSW met briefly with Pt and her husband. Both are doing well and are eager for d/c to [facility] later today.  CSW signing off. Family appreciative of CSW support while in ICU.   Doreen Salvage, LCSWA ICU/Stepdown Clinical Social Worker [facility] Cell [phone] Hours 8am-1200pm M-F

## 2011-12-09 NOTE — Progress Notes (Signed)
Nutrition Brief follow-up.  Pt currently NPO with TPN plus lipids. Pt with new trach. Discussed with RN and SLP. Pt not yet ready for swallow eval.  TF on hold since NG out 9/13.  Concern for possible pelvic abscess.  Would like to use gut to feed.    Will monitor nutrition plan of care.  Swallow eval with possible po in the next couple of days vs. TF.  Oran Rein, RD, LDN Clinical Inpatient Dietitian Pager:  [phone] Weekend and after hours pager:  [phone]

## 2011-12-09 NOTE — Progress Notes (Signed)
17 Days Post-Op  Subjective: Baseline anxiety Otherwise stable  Objective: Vital signs in last 24 hours: Temp:  [97.4 F (36.3 C)-99.8 F (37.7 C)] 98.6 F (37 C) (09/17 0800) Pulse Rate:  [72-105] 84  (09/17 0920) Resp:  [19-32] 23  (09/17 0920) BP: (101-167)/(43-85) 150/60 mmHg (09/17 0920) SpO2:  [96 %-100 %] 99 % (09/17 0920) FiO2 (%):  [21 %-28 %] 21 % (09/17 0800) Last BM Date: 12/09/11  Intake/Output from previous day: 09/16 0701 - 09/17 0700 In: 2213.3 [I.V.:260; IV Piggyback:795; TPN:1158.3] Out: 3130 [Urine:3100; Stool:30] Intake/Output this shift: Total I/O In: 160 [I.V.:40; TPN:120] Out: 150 [Urine:150]  Unchanged exam Handling secretions  Lab Results:   Basename 12/09/11 0500 12/08/11 0515  WBC 12.0* 12.1*  HGB 8.2* 8.6*  HCT 24.6* 25.8*  PLT 355 295   BMET  Basename 12/09/11 0500 12/08/11 1500  NA 130* 135  K 4.5 3.2*  CL 97 97  CO2 26 28  GLUCOSE 462* 171*  BUN 21 19  CREATININE 0.37* 0.32*  CALCIUM 7.7* 7.8*   PT/INR No results found for this basename: LABPROT:2,INR:2 in the last 72 hours ABG No results found for this basename: PHART:2,PCO2:2,PO2:2,HCO3:2 in the last 72 hours  Studies/Results: Dg Chest Port 1 View  12/08/2011  *RADIOLOGY REPORT*  Clinical Data: Emphysema.  PORTABLE CHEST - 1 VIEW  Comparison: CT chest 12/06/2011 and plain film chest 12/07/2011.  Findings: Support tubes lines are unchanged.  Bilateral pleural effusions persist.  Subcutaneous emphysema over the left chest is again identified. No pneumothorax is identified.  Compressive atelectasis is noted.  IMPRESSION: No interval change in bilateral pleural effusions and compressive atelectasis.   Original Report Authenticated By: Bernadene Bell. Maricela Curet, M.D.     Anti-infectives: Anti-infectives     Start     Dose/Rate Route Frequency Ordered Stop   12/01/11 2200   vancomycin (VANCOCIN) 500 mg in sodium chloride 0.9 % 100 mL IVPB  Status:  Discontinued        500 mg 100  mL/hr over 60 Minutes Intravenous Every 12 hours 12/01/11 1035 12/03/11 1159   12/01/11 1200   vancomycin (VANCOCIN) 1,250 mg in sodium chloride 0.9 % 250 mL IVPB        1,250 mg 166.7 mL/hr over 90 Minutes Intravenous  Once 12/01/11 1035 12/01/11 1305   11/29/11 1000   aztreonam (AZACTAM) 2 g in dextrose 5 % 50 mL IVPB  Status:  Discontinued        2 g 100 mL/hr over 30 Minutes Intravenous Every 8 hours 11/29/11 0907 12/07/11 0913   11/28/11 1200   aztreonam (AZACTAM) 2 g in dextrose 5 % 50 mL IVPB  Status:  Discontinued        2 g 100 mL/hr over 30 Minutes Intravenous Every 12 hours 11/28/11 0240 11/28/11 0903   11/28/11 1000   fluconazole (DIFLUCAN) IVPB 200 mg  Status:  Discontinued        200 mg 100 mL/hr over 60 Minutes Intravenous Every 24 hours 11/28/11 0915 12/03/11 1159   11/28/11 0200   micafungin (MYCAMINE) 100 mg in sodium chloride 0.9 % 100 mL IVPB  Status:  Discontinued     Comments: Pharmacy to verify the dose      100 mg 100 mL/hr over 1 Hours Intravenous Daily at bedtime 11/28/11 0133 11/28/11 0903   11/28/11 0130   aztreonam (AZACTAM) 2 g in dextrose 5 % 50 mL IVPB        2 g 100 mL/hr  over 30 Minutes Intravenous  Once 11/28/11 0130 11/28/11 0230   11/23/11 0300   fluconazole (DIFLUCAN) IVPB 200 mg  Status:  Discontinued        200 mg 100 mL/hr over 60 Minutes Intravenous Every 24 hours 11/22/11 0254 11/26/11 1442   11/22/11 0300   fluconazole (DIFLUCAN) IVPB 400 mg        400 mg 200 mL/hr over 60 Minutes Intravenous  Once 11/22/11 0254 11/22/11 0518   11/22/11 0230   metroNIDAZOLE (FLAGYL) IVPB 500 mg  Status:  Discontinued        500 mg 100 mL/hr over 60 Minutes Intravenous  Once 11/22/11 0229 11/22/11 0230   11/21/11 2230   ciprofloxacin (CIPRO) IVPB 400 mg  Status:  Discontinued        400 mg 200 mL/hr over 60 Minutes Intravenous 2 times daily 11/21/11 2207 11/29/11 0840   11/21/11 2230   metroNIDAZOLE (FLAGYL) IVPB 500 mg  Status:  Discontinued          500 mg 100 mL/hr over 60 Minutes Intravenous 3 times per day 11/21/11 2207 12/07/11 0913   11/21/11 2100   meropenem (MERREM) 1 g in sodium chloride 0.9 % 100 mL IVPB  Status:  Discontinued        1 g 200 mL/hr over 30 Minutes Intravenous 3 times per day 11/21/11 2013 11/21/11 2146          Assessment/Plan: s/p Procedure(s) (LRB) with comments: EXPLORATORY LAPAROTOMY (N/A) - drainage of pelvic abscess COLON RESECTION SIGMOID (N/A) COLOSTOMY (N/A) - desending colostomy  Ready for transfer to LTAC  LOS: 18 days    Kolin Erdahl A 12/09/2011

## 2011-12-09 NOTE — Progress Notes (Signed)
[facility] - Treatment Patient Details  Name: [name] MRN: [id number] Date of Birth: 1932/01/14  Today's Date: 12/09/2011 Time: 7829-5621 SLP Time Calculation (min): 38 min  Past Medical History:  Past Medical History  Diagnosis Date  . Cancer 07/08/11    follicular B cell lymphoma  . TIA (transient ischemic attack)   . Arthritis   . Hypertension   . Esophageal abnormality     strictures  . Arthritis 09/04/2011  . Dehydration 11/20/2011  . Diarrhea 11/20/2011  . Complication of anesthesia   . PONV (postoperative nausea and vomiting)     in the 1980's  . GERD (gastroesophageal reflux disease)   . Esophageal stricture     x 2   Past Surgical History:  Past Surgical History  Procedure Date  . Appendectomy   . Parotidectomy w/ neck dissection total   . Tonsillectomy   . Abdominal hysterectomy     left ovaries  . Laparotomy 11/22/2011    Procedure: EXPLORATORY LAPAROTOMY;  Surgeon: Velora Heckler, MD;  Location: [facility];  Service: General;  Laterality: N/A;  drainage of pelvic abscess  . Colostomy revision 11/22/2011    Procedure: COLON RESECTION SIGMOID;  Surgeon: Velora Heckler, MD;  Location: [facility];  Service: General;  Laterality: N/A;  . Colostomy 11/22/2011    Procedure: COLOSTOMY;  Surgeon: Velora Heckler, MD;  Location: [facility];  Service: General;  Laterality: N/A;  desending colostomy    Assessment / Plan / Recommendation Clinical Impression  Patient complains of fatigue and headache (level 8).  RN notified.  Still unable to obtain phonation, but tolerated PMSV in place for 35 minutes.  Patient indicated she wanted the PMSV removed before SLP left the room so she could sleep.  Intelligibility is poor, with little movement of the articulators spontaneously, but improved with instruction and cues.  Patient answered questions with one to two word whispered reponses, but became easily frustrated when not understood.  Instruction for coordination of respiration  with phonation ongoing.  Patient currently appears too weak to even tolerate a MBS at this point, and is not likely to swallow without aspiration.  It is doubtful that she will meet her nutritional needs with p.o.'s at this point.  Discussed this with Nutritionist, RN, and discussed case with RT as well.    Plan  Continue with current plan of care    Follow Up Recommendations       Pertinent Vitals/Pain VSS; Pain at level 8 (headache).  RN notified.    SLP Goals     PMSV Trial  PMSV was placed for: 35 minutes Able to redirect subglottic air through upper airway: Yes Able to Attain Phonation: No Voice Quality: Aphonic;Breathy Able to Expectorate Secretions: Yes Level of Secretion Expectoration with PMSV: Oral Breath Support for Phonation: Inadequate Intelligibility: Intelligibility reduced Word: 25-49% accurate Phrase: 0-24% accurate Sentence: 0-24% accurate Conversation: Not tested Respirations During Trial: 30  SpO2 During Trial: 98 % Pulse During Trial: 84  Behavior: Alert;Anxious   Tracheostomy Tube  Additional Tracheostomy Tube Assessment Secretion Description: White, frothy Level of Secretion Expectoration: Tracheal;Oral (Oral, with PMSV in place)    Vent Dependency  FiO2 (%): 21 %    Cuff Deflation Trial  GO     Length of Time for Cuff Deflation Trial: Cuff has remained deflated since initial PMSV trial on 9/16, and tolerated well. Behavior: Alert;Anxious;[name] T 12/09/2011, 10:39 AM

## 2011-12-09 NOTE — Progress Notes (Signed)
Name: [name] MRN: [id number] DOB: [birth year]    LOS: 18  Referring Provider:  Dr. Gerrit Friends  Reason for Referral:  Septic shock  PULMONARY / CRITICAL CARE MEDICINE  HPI:  78 yo female admitted 11/21/2011 following several days of abdominal pain.  Found to have perforated sigmoid diverticulum with peritonitis, and pelvic abscess.  Had laparotomy 8/31.  Developed respiratory failure and shock post-op.  PCCM consulted 8/31.  She has hx of B cell lymphoma s/p 4 cycles of CVP- rituxan last 8/13   Events Since Admission: 8/31 Laparotomy, sigmoid colectomy and ostomy 9/01 A fib with RVR 9/02 Off pressors 9/06 A fib RVR>>back to sinus; added precedex for agitation; hypotensive.  Rt thoracentesis (transudate). 9/9 Reintubated. 9/14 XR shows free air in abdomen & sub cut emphysema CT abdomen >> Large amount of emphysema overlying the soft tissues of the left chest wall without evidence of pneumomediastinum or pneumothorax. Bilateral pleural effusions and bibasilar atelectasis worse airspace disease. large amount of free intraperitoneal gas in the left lower quadrant and scattered free intraperitoneal gas in the upper  abdomen. Air-fluid level in the peritoneal space is present at the left lower quadrant. There is also a large amount of emphysema over the left abdominal wall in the overlying soft tissues 9/14 ATC    Subjective:  AFebrile last 24 h, tolerated ATC well last 3 ds, tolerating PMV Denies pain Secretions +, thick white  Vital Signs: Temp:  [97.4 F (36.3 C)-99.8 F (37.7 C)] 99 F (37.2 C) (09/17 0400) Pulse Rate:  [72-105] 94  (09/17 0600) Resp:  [19-32] 20  (09/17 0600) BP: (101-167)/(43-85) 123/59 mmHg (09/17 0600) SpO2:  [96 %-100 %] 99 % (09/17 0600) FiO2 (%):  [28 %] 28 % (09/17 0754)  Intake/Output Summary (Last 24 hours) at 12/09/11 0820 Last data filed at 12/09/11 0600  Gross per 24 hour  Intake 2143.33 ml  Output   2780 ml  Net -636.67 ml   Physical  Examination: General: Restless, trying to mouth words, able to clear secretions Neuro:, moves extremities, follows commands well HEENT: trach sutured,  mucus membranes intact but dry. Neck:  Supple Cardiovascular: s1s2 regular, no murmur Lungs: tachypnea, clear but decreased breath sounds Abdomen:  Wound dressings clean. Stoma red with air in bag.  Hypoactive bowel sounds, soft, non tender Musculoskeletal:  No edema, moves ext to command Skin:  Midline incision and Left sided colostomy placement, other wise intact. No rashes.   Crepitus over chest wall & LUE -decreased  ASSESSMENT AND PLAN  PULMONARY  Lab 12/06/11 0408 12/05/11 0420 12/04/11 0455 12/03/11 0630 12/03/11 0454  PHART 7.508* 7.509* 7.583* 7.581* 7.614*  PCO2ART 33.9* 39.0 36.0 34.7* 32.2*  PO2ART 115.0* 114.0* 113.0* 125.0* 140.0*  HCO3 26.7* 30.8* 34.1* 32.8* 32.9*  O2SAT 99.4 99.5 99.5 99.6 99.3   CXR:  9/16 no change in Emphysema over the left chest wall . No definite pneumothorax. Free intraperitoneal gas is resolved   Ventilator Settings: ATC   ETT:  8/31 >> 9/8 >>> 9/9 >>>9/13 Trach 9/13 >>  A: 1) Acute respiratory failure in setting of septic shock from peritonitis. Failed Extubation on 9/8. Clinically stable - hence free air & sub cut emphysema appear trach related rather than new bowel perf or pnthx    P:    ATC as tolerated Swallow evaln PT   CARDIOVASCULAR  Lab 12/05/11 0240  TROPONINI <0.30  LATICACIDVEN 1.2  PROBNP --   Lines:  Lt [place] CVL 8/31 >> out LUE  PICC 9/13 >>  Echo 9/01>>periapical akinesis, EF 25 to 30%, diffuse hypokinesis, grade 1 diastolic dysfx, mod AR, mild MR, mod TR, mild/mod RV systolic dysfx, PAS 37 mmHg  A:  1) Septic shock 2nd to peritonitis>>resolved, now HTN. 2) A fib with RVR>>back in sinus 9/02 and remained. 3) Acute systolic CHF>>?if this is sepsis related cardiomyopathy. 4) Hypotension 9/06 more likely related to diuresis, sedation and lopressor use rather  than recurrent sepsis    P: Changed to Lopressor 5 q 12h Resume plavix at some point  RENAL  Lab 12/09/11 0500 12/08/11 1500 12/08/11 0515 12/07/11 0645 12/06/11 2027 12/06/11 0400 12/05/11 0240 12/04/11 0510 12/03/11 0440  NA 130* 135 139 140 139 -- -- -- --  K 4.5 3.2* -- -- -- -- -- -- --  CL 97 97 103 106 105 -- -- -- --  CO2 26 28 29 27 27  -- -- -- --  BUN 21 19 20 22  24* -- -- -- --  CREATININE 0.37* 0.32* 0.32* 0.36* 0.40* -- -- -- --  CALCIUM 7.7* 7.8* 7.5* 7.6* 7.7* -- -- -- --  MG 1.6 -- 1.7 -- -- 2.1 1.8 1.8 --  PHOS -- -- 1.8* -- -- 2.9 2.8 3.0 3.6   Intake/Output      09/16 0701 - 09/17 0700 09/17 0701 - 09/18 0700   I.V. (mL/kg) 240 (4.1)    IV Piggyback 795    TPN 1158.3    Total Intake(mL/kg) 2193.3 (37.2)    Urine (mL/kg/hr) 3100 (2.2)    Stool 30    Total Output 3130    Net -936.7          Foley:  Placed 11/21/2011  A: Hypokalemia, hypomagnesemia, hypophosphatemia P:   Replaced Monitor renal fx, urine outpt resume lasix for neg balance   GASTROINTESTINAL  Lab 12/08/11 0515 12/07/11 0645 12/05/11 0240 12/04/11 0510  AST 17 17 18 15   ALT 11 12 12 10   ALKPHOS 166* 189* 183* 133*  BILITOT 0.4 0.5 0.3 0.3  PROT 4.2* 4.3* 4.0* 3.9*  ALBUMIN 1.5* 1.7* 1.5* 1.5*   A:  Perforated sigmoid diverticulum s/p laparotomy>>CT abd/pelvis 9/06 not impressive for abscess. Surgery is following.  Had swallow issues pre-op -required esophageal dilation -Buccini   P:   Post-op care per [facility]. TF started 9/08 per [facility]- on hold since ng out, Would like to use gut to feed Remain concerned about possible pelvic abscess LFTs periodically while on TNA   HEMATOLOGIC  Lab 12/09/11 0500 12/08/11 0515 12/07/11 0645 12/06/11 0400 12/05/11 0240  HGB 8.2* 8.6* 8.1* 8.3* 9.7*  HCT 24.6* 25.8* 24.4* 25.2* 29.1*  PLT 355 295 319 335 286  INR -- -- -- -- 1.10  APTT -- -- -- -- 25   A:  1)  Follicular B cell lymphoma, currently undergoing chemotherapy. 2)  Anemia of  critical illness, and chronic disease. 3)  Thrombocytopenia likely related to sepsis/critical illness>>improved. P:  F/u CBC Transfuse for Hb < 7   INFECTIOUS  Lab 12/09/11 0500 12/08/11 0515 12/07/11 0645 12/06/11 0400 12/05/11 0240  WBC 12.0* 12.1* 15.6* 15.5* 14.9*  PROCALCITON -- -- 0.10 -- --   Cultures: Blood 8/31 >>negative Urine 8/31 >>negative Sputum 9/05 >>Oral flora Rt pleural fluid 9/06>>neg Urine 9/06: E coli (pansens) C diff 9/06>>negative Blood 9/06>> no growth Urine culture 9/11>>ng  Antibiotics: Cipro 8/31 >>9/06 Flagyl 8/31 (peritonitis)>>9/15 Fluconazole 8/31(peritonitis) >>9/13 Aztreonam 9/07>>9/15 Vancomycin 9/9>>9/11  A:   1) Peritonitis 2nd to sigmoid perforation>>improved. 2)  Possible HCAP (no organisms specified)  3) leukocytosis>>she is getting solu cortef, WBCs trending down  E coli in urine cx 9/06.  Hx of PCN allergy.  Febrile but WC decreasing & pct low P:   Observe off abx  .  ENDOCRINE  Lab 12/09/11 0723 12/09/11 0342 12/08/11 2309 12/08/11 1949 12/08/11 1528  GLUCAP 129* 138* 154* 129* 161*   A:  Hyperglycemia, on chronic steroids P:  Blood sugar monitoring Q4H and SSI Dc Stress dose steroids & observe - unclear why she was on steroids- seems like short course rather than chronic   NEUROLOGIC  A:  Anxiety/ acute encephalopathy  most likely ventilator related.   P:   Dc  Seroquel  -  Dc versed fent for pain PT consult   LTAC placement  OK , PEG deferred until swallow prognosis clearer    Cyril Mourning MD. FCCP. [facility] Pager [phone] If no response call [phone]

## 2011-12-09 NOTE — Plan of Care (Signed)
Problem: Phase I Progression Outcomes Goal: OOB as tolerated unless otherwise ordered Outcome: Progressing Up in chair with PT 9/16

## 2011-12-10 DIAGNOSIS — Z930 Tracheostomy status: Secondary | ICD-10-CM

## 2011-12-10 DIAGNOSIS — J9600 Acute respiratory failure, unspecified whether with hypoxia or hypercapnia: Secondary | ICD-10-CM

## 2011-12-10 DIAGNOSIS — I5021 Acute systolic (congestive) heart failure: Secondary | ICD-10-CM

## 2011-12-10 LAB — TSH: TSH: 0.923 u[IU]/mL (ref 0.350–4.500)

## 2011-12-10 LAB — POTASSIUM
Potassium: 4.7 mEq/L (ref 3.5–5.1)
Potassium: 5.1 mEq/L (ref 3.5–5.1)

## 2011-12-10 LAB — CALCIUM, IONIZED: Calcium, Ion: 1.1 mmol/L — ABNORMAL LOW (ref 1.12–1.32)

## 2011-12-10 LAB — PREALBUMIN: Prealbumin: 26.6 mg/dL (ref 17.0–34.0)

## 2011-12-10 NOTE — Consult Note (Addendum)
Name: [name] MRN: [id number] DOB: December 09, 1931    LOS: 1  Referring Provider:  Carron Curie Reason for Referral:  Chronic respiratory failure  PULMONARY / CRITICAL CARE MEDICINE        Name: [name] MRN: [id number] DOB: 20-Nov-1931    LOS: 1    History of Present Illness:  HPI: 78 yo female admitted 11/21/2011 following several days of abdominal pain. Found to have perforated sigmoid diverticulum with peritonitis, and pelvic abscess. Had laparotomy 8/31. Developed respiratory failure and shock post-op. PCCM consulted 8/31. She has hx of B cell lymphoma s/p 4 cycles of CVP- rituxan last 8/13. Underwent a prolonged ICU course, with failure to wean being multifactorial, do to acute encephalopathy, agitation, and volume overload.[name] Ultimately required tracheostomy placement on 9/13. This allowed for rapid transition of sedating regimen, and liberation from mechanical ventilator. [facility], rehabilitation, and airway support. Pulmonary was asked to evaluate to assist with tracheostomy care, and timing of decannulation.  Events at [facility]:  8/31 Laparotomy, sigmoid colectomy and ostomy  9/01 A fib with RVR  9/02 Off pressors  9/06 A fib RVR>>back to sinus; added precedex for agitation; hypotensive. Rt thoracentesis (transudate).  9/9 Reintubated.  9/14 XR shows free air in abdomen & sub cut emphysema  CT abdomen >> Large amount of emphysema overlying the soft tissues of the left chest wall without evidence of pneumomediastinum or pneumothorax. Bilateral pleural effusions and bibasilar atelectasis worse airspace disease.  large amount of free intraperitoneal gas in the left lower quadrant and scattered free intraperitoneal gas in the upper  abdomen. Air-fluid level in the peritoneal space is present at the left lower quadrant. There is also a large amount of emphysema over the left abdominal wall in the overlying soft tissues    9/14 ATC   Lines / Drains: ETT: 8/31 >> 9/8 >>> 9/9 >>>9/13  Trach 9/13 >> Cultures:  Blood 8/31 >>negative  Urine 8/31 >>negative  Sputum 9/05 >>Oral flora  Rt pleural fluid 9/06>>neg  Urine 9/06: E coli (pansens)  C diff 9/06>>negative  Blood 9/06>> no growth  Urine culture 9/11>>ng   Antibiotics:  Cipro 8/31 >>9/06  Flagyl 8/31 (peritonitis)>>9/15  Fluconazole 8/31(peritonitis) >>9/13  Aztreonam 9/07>>9/15  Vancomycin 9/9>>9/11   Past Medical History  Diagnosis Date  . Cancer 07/08/11    follicular B cell lymphoma  . TIA (transient ischemic attack)   . Arthritis   . Hypertension   . Esophageal abnormality     strictures  . Arthritis 09/04/2011  . Dehydration 11/20/2011  . Diarrhea 11/20/2011  . Complication of anesthesia   . PONV (postoperative nausea and vomiting)     in the 1980's  . GERD (gastroesophageal reflux disease)   . Esophageal stricture     x 2   Past Surgical History  Procedure Date  . Appendectomy   . Parotidectomy w/ neck dissection total   . Tonsillectomy   . Abdominal hysterectomy     left ovaries  . Laparotomy 11/22/2011    Procedure: EXPLORATORY LAPAROTOMY;  Surgeon: Velora Heckler, MD;  Location: [facility];  Service: General;  Laterality: N/A;  drainage of pelvic abscess  . Colostomy revision 11/22/2011    Procedure: COLON RESECTION SIGMOID;  Surgeon: Velora Heckler, MD;  Location: [facility];  Service: General;  Laterality: N/A;  . Colostomy 11/22/2011    Procedure: COLOSTOMY;  Surgeon: Velora Heckler, MD;  Location: [facility];  Service: General;  Laterality:  N/A;  desending colostomy   Prior to Admission medications   Medication Sig Start Date End Date Taking? Authorizing Provider  fat emulsion 20 % infusion Inject 240 mLs into the vein continuous. 12/09/11   Blenda Mounts, NP  fentaNYL (SUBLIMAZE) 0.05 MG/ML injection Inject 0.5-1 mLs (25-50 mcg total) into the vein every 2 (two) hours as needed for severe pain. 12/09/11   Blenda Mounts, NP    Allergies Allergies  Allergen Reactions  . Codeine Hives  . Other Nausea And Vomiting    anesthesia  . Penicillins Hives  . Sulfa Antibiotics Hives    Family History Family History  Problem Relation Age of Onset  . Cancer Mother 47    gastric  . Hypertension Father     heart disease  . Cancer Maternal Aunt 63    breast cancer  . Cancer Maternal Uncle 71    colon    Social History  reports that she has never smoked. She has never used smokeless tobacco. She reports that she does not drink alcohol or use illicit drugs.  Review Of Systems  Unable to obtain full review of systems due to tracheostomy  Vital Signs:  Reviewed, saturations 98% on 21% aerosol trach collar     Physical Examination: General:  Elderly female patient currently awake, oriented, communicating with writing board. No acute distress. Neuro:  Awake, oriented, no focal deficits HEENT:  #6 tracheostomy, midline, frothy white secretions Cardiovascular:  Regular rate and rhythm Lungs:  Diffuse coarse rhonchi, respiratory effort is equal and not labored. Abdomen:  Nontender, colostomy pink, draining stool. Midabdominal wound VAC intact Musculoskeletal:  Generalized weakness throughout, has trace edema Skin:  intact  Ventilator settings:    Labs and Imaging:   Lab 12/09/11 1433 12/09/11 0500 12/08/11 1500  NA 137 130* 135  K 2.8* 4.5 3.2*  CL 97 97 97  CO2 30 26 28   BUN 23 21 19   CREATININE 0.35* 0.37* 0.32*  GLUCOSE 86 462* 171*    Lab 12/09/11 1433 12/09/11 0500 12/08/11 0515  HGB 8.9* 8.2* 8.6*  HCT 26.8* 24.6* 25.8*  WBC 14.7* 12.0* 12.1*  PLT 395 355 295    Assessment and Plan:  Tracheostomy dependence, following acute respiratory failure in the setting of septic shock, peritonitis, complicated by acute systolic heart failure / possible septic cardiomyopathy (EF 25 to 30%, diffuse hypokinesis, grade 1 diastolic dysfx, mod AR, mild MR, mod TR, mild/mod RV systolic dysfx, PAS 37  mmHg)and resultant pulmonary edema/volume excess. She is status post tracheostomy on 9/13. She is now liberated from mechanical ventilator. Recommendations Continue aggressive diuresis for negative fluid balance When necessary chest x-rays Continue routine tracheostomy care When secretions have improved would recommend speech valve trials Will need swallowing evaluation when cleared by surgery to use gut   Systolic heart failure. This was acute, a new diagnosis during this hospitalization. Did have paroxysmal atrial fibrillation during this stay. She is now normal sinus rhythm. There was some question as to whether or not her poor ejection fraction may be related to a septic cardiomyopathy. Recommendations Continue current diuretic regimen Would consider followup echocardiogram in 4-6 weeks.   Acute encephalopathy This was a major barrier while on mechanical ventilator. Following tracheostomy her agitation has been markedly decreased, and Her encephalopathy is now resolved. Recommendation Continue support measures BABCOCK,PETE 12/10/2011, 10:56 AM   Reviewed above, examined pt, and agree with assessment/plan.  She is well know to me from her extended stay at [facility].  She has made  good progress, and is now liberated from ventilatory requirements.  She is still debility, and this is may barrier to attempts at decannulation at present.  Will continue to assess her status, but will likely be able to proceed with decannulation once her physical conditioning improves.  Coralyn Helling, MD [facility] Pulmonary/Critical Care 12/10/2011, 11:22 AM Pager:  [phone] After 3pm call: [phone]

## 2011-12-11 LAB — BASIC METABOLIC PANEL
BUN: 26 mg/dL — ABNORMAL HIGH (ref 6–23)
Calcium: 8.2 mg/dL — ABNORMAL LOW (ref 8.4–10.5)
Chloride: 101 mEq/L (ref 96–112)
Creatinine, Ser: 0.37 mg/dL — ABNORMAL LOW (ref 0.50–1.10)
GFR calc Af Amer: >90 mL/min (ref >90)

## 2011-12-11 LAB — CBC
Hemoglobin: 8.3 g/dL — ABNORMAL LOW (ref 12.0–15.0)
MCHC: 32.8 g/dL (ref 30.0–36.0)
RBC: 2.7 MIL/uL — ABNORMAL LOW (ref 3.87–5.11)
RDW: 17 % — ABNORMAL HIGH (ref 11.5–15.5)

## 2011-12-11 NOTE — Procedures (Signed)
Patient seen and examined, agree with above note.  I dictated the care and orders written for this patient under my direction.  Milus Fritze, M.D. [phone] 

## 2011-12-12 ENCOUNTER — Other Ambulatory Visit (HOSPITAL_COMMUNITY): Payer: Medicare Other

## 2011-12-12 ENCOUNTER — Other Ambulatory Visit: Payer: Medicare Other | Admitting: Lab

## 2011-12-12 ENCOUNTER — Ambulatory Visit: Payer: Medicare Other | Admitting: Oncology

## 2011-12-12 DIAGNOSIS — I509 Heart failure, unspecified: Secondary | ICD-10-CM

## 2011-12-12 LAB — BASIC METABOLIC PANEL
CO2: 30 mEq/L (ref 19–32)
Calcium: 7.9 mg/dL — ABNORMAL LOW (ref 8.4–10.5)
Creatinine, Ser: 0.41 mg/dL — ABNORMAL LOW (ref 0.50–1.10)
Glucose, Bld: 127 mg/dL — ABNORMAL HIGH (ref 70–99)

## 2011-12-12 NOTE — Progress Notes (Signed)
[name] is a 78 y.o. female admitted to [facility] on 11/21/2011 with abdominal pain from perforated sigmoid diverticulum with peritonitis and pelvic abscess requiring laparotomy.  She has hx of B cell lymphoma (Last chemo 11/04/11).  Post-op course complicated by acute systolic CHF, prolonged stay on ventilator requiring tracheostomy, volume overload, agitated delirium, and A fib with RVR.  She was transferred 12/09/2011 to [facility] for wound care, rehab, and airway support.  PCCM consulted 9/18 to assist with tracheostomy management.  Line/tube: Trach 9/13 >>  Tests/events: Echo 9/02>>EF 25 to 30%, diffuse hypokinesis, grade 1 diastolic dysfx, mod AR, mild MR, mod TR, PAS 37 mmHg  SUBJECTIVE: Tolerating trach collar.  C/o cough causing difficulty with sleep.  Still has secretions.  OBJECTIVE: Vitals signs reviewed  General - No distress HEENT - trach site clean with clear secretions Cardiac - s1s2 regular Chest - scattered rhonchi Abd - soft, non tender Ext - ankle edema Neuro - awake, follows commands Psych - normal mood, behavior  Lab Results  Component Value Date   WBC 12.5* 12/11/2011   HGB 8.3* 12/11/2011   HCT 25.3* 12/11/2011   MCV 93.7 12/11/2011   PLT 415* 12/11/2011   Lab Results  Component Value Date   CREATININE 0.41* 12/12/2011   BUN 27* 12/12/2011   NA 137 12/12/2011   K 3.0* 12/12/2011   CL 101 12/12/2011   CO2 30 12/12/2011   Lab Results  Component Value Date   ALT 11 12/08/2011   AST 17 12/08/2011   ALKPHOS 166* 12/08/2011   BILITOT 0.4 12/08/2011   No results found.   ASSESSMENT/PLAN:  A: Acute respiratory failure in setting of peritonitis/septic shock complicated by acute systolic CHF, volume overload, agitated delirium.  This necessitated tracheostomy.  Since liberated from ventilator. P: Continue trach collar as tolerated  Add mucomyst for 48 hrs to help with airway secretions Continue scheduled BD's for now F/u CXR 9/23 Keep in negative fluid balance as  tolerated Speech eval for swallowing and speech valve Abx per primary team F/u Echo first week of October  Coralyn Helling, MD [facility] Pulmonary/Critical Care 12/12/2011, 10:04 AM Pager:  [phone] After 3pm call: [phone]

## 2011-12-13 LAB — CBC WITH DIFFERENTIAL/PLATELET
Eosinophils Absolute: 0.4 10*3/uL (ref 0.0–0.7)
Eosinophils Relative: 4 % (ref 0–5)
Hemoglobin: 10 g/dL — ABNORMAL LOW (ref 12.0–15.0)
Lymphs Abs: 0.7 10*3/uL (ref 0.7–4.0)
MCH: 31 pg (ref 26.0–34.0)
MCV: 94.7 fL (ref 78.0–100.0)
Monocytes Relative: 5 % (ref 3–12)
RBC: 3.23 MIL/uL — ABNORMAL LOW (ref 3.87–5.11)

## 2011-12-13 LAB — VANCOMYCIN, TROUGH: Vancomycin Tr: <5 ug/mL — ABNORMAL LOW (ref 10.0–20.0)

## 2011-12-13 LAB — POTASSIUM: Potassium: 3.7 mEq/L (ref 3.5–5.1)

## 2011-12-14 LAB — POTASSIUM: Potassium: 3.2 mEq/L — ABNORMAL LOW (ref 3.5–5.1)

## 2011-12-15 ENCOUNTER — Other Ambulatory Visit (HOSPITAL_COMMUNITY): Payer: Medicare Other

## 2011-12-15 DIAGNOSIS — J90 Pleural effusion, not elsewhere classified: Secondary | ICD-10-CM

## 2011-12-15 DIAGNOSIS — J984 Other disorders of lung: Secondary | ICD-10-CM | POA: Diagnosis not present

## 2011-12-15 DIAGNOSIS — J9819 Other pulmonary collapse: Secondary | ICD-10-CM | POA: Diagnosis not present

## 2011-12-15 LAB — CBC WITH DIFFERENTIAL/PLATELET
Basophils Relative: 0 % (ref 0–1)
Eosinophils Absolute: 0.2 10*3/uL (ref 0.0–0.7)
MCH: 30.8 pg (ref 26.0–34.0)
MCHC: 32.7 g/dL (ref 30.0–36.0)
Neutrophils Relative %: 83 % — ABNORMAL HIGH (ref 43–77)
Platelets: 367 10*3/uL (ref 150–400)
RDW: 16.5 % — ABNORMAL HIGH (ref 11.5–15.5)

## 2011-12-15 LAB — COMPREHENSIVE METABOLIC PANEL
ALT: 9 U/L (ref 0–35)
Albumin: 1.7 g/dL — ABNORMAL LOW (ref 3.5–5.2)
Alkaline Phosphatase: 144 U/L — ABNORMAL HIGH (ref 39–117)
Calcium: 7.9 mg/dL — ABNORMAL LOW (ref 8.4–10.5)
Potassium: 4.3 mEq/L (ref 3.5–5.1)
Sodium: 132 mEq/L — ABNORMAL LOW (ref 135–145)
Total Protein: 4.5 g/dL — ABNORMAL LOW (ref 6.0–8.3)

## 2011-12-15 NOTE — Progress Notes (Signed)
[name] is a 78 y.o. female admitted to [facility] on 11/21/2011 with abdominal pain from perforated sigmoid diverticulum with peritonitis and pelvic abscess requiring laparotomy.  She has hx of B cell lymphoma (Last chemo 11/04/11).  Post-op course complicated by acute systolic CHF, prolonged stay on ventilator requiring tracheostomy, volume overload, agitated delirium, and A fib with RVR.  She was transferred 12/09/2011 to [facility] for wound care, rehab, and airway support.  PCCM consulted 9/18 to assist with tracheostomy management.  Line/tube: Trach 9/13 >>  Tests/events: Echo 9/02>>EF 25 to 30%, diffuse hypokinesis, grade 1 diastolic dysfx, mod AR, mild MR, mod TR, PAS 37 mmHg  SUBJECTIVE: Tolerating trach collar.  C/o cough causing difficulty with sleep.  Still has secretions.not tolerating passing mirror valve.  OBJECTIVE: 28% aerosol trach collar, sats 95%.  General - No distress HEENT - trach site clean with clear secretions Cardiac - s1s2 regular Chest - scattered rhonchi Abd - soft, non tender Ext - ankle edema, This is decreased. Neuro - awake, follows commands Psych - normal mood, behavior  Lab Results  Component Value Date   WBC 10.3 12/15/2011   HGB 9.0* 12/15/2011   HCT 27.5* 12/15/2011   MCV 94.2 12/15/2011   PLT 367 12/15/2011   Lab Results  Component Value Date   CREATININE 0.41* 12/12/2011   BUN 27* 12/12/2011   NA 137 12/12/2011   K 3.2* 12/14/2011   CL 101 12/12/2011   CO2 30 12/12/2011   Lab Results  Component Value Date   ALT 11 12/08/2011   AST 17 12/08/2011   ALKPHOS 166* 12/08/2011   BILITOT 0.4 12/08/2011   Dg Chest Port 1 View  12/15/2011  *RADIOLOGY REPORT*  Clinical Data: Follow up of pleural effusion.  Tracheostomy.  PORTABLE CHEST - 1 VIEW  Comparison: 12/09/2011  Findings: Tracheostomy appropriately positioned.  Left-sided PICC line terminates at the low SVC.  Right-sided Port-A-Cath is unchanged, with tip at the caval/atrial junction.  Normal heart size.   Layering small left pleural effusion. No pneumothorax.  Slight improvement in patchy right base atelectasis. No change in left base air space disease.  Breathing apparatus projects over the left upper lobe/apex.  IMPRESSION:  1.  No change in left pleural effusion with adjacent atelectasis or infection. 2.  Minimal improvement in right base atelectasis.   Original Report Authenticated By: Consuello Bossier, M.D.   agree, aeration in the right has improved, persistent left-sided effusion/atelectasis   ASSESSMENT/PLAN:  A: Acute respiratory failure in setting of peritonitis/septic shock complicated by acute systolic CHF, volume overload, agitated delirium.  This necessitated tracheostomy.  Since liberated from ventilator. P: Continue trach collar as tolerated  Continue scheduled BD's for now F/u CXR 9/23 Keep in negative fluid balance as tolerated Speech eval for swallowing and speech valve Abx per primary team F/u Echo first week of October  Patient seen and examined, agree with above note.  I dictated the care and orders written for this patient under my direction.  Koren Bound, M.D. [phone]

## 2011-12-16 ENCOUNTER — Other Ambulatory Visit (HOSPITAL_COMMUNITY): Payer: Medicare Other

## 2011-12-16 LAB — BASIC METABOLIC PANEL
CO2: 26 mEq/L (ref 19–32)
Calcium: 8.1 mg/dL — ABNORMAL LOW (ref 8.4–10.5)
Chloride: 96 mEq/L (ref 96–112)
Creatinine, Ser: 0.38 mg/dL — ABNORMAL LOW (ref 0.50–1.10)
Glucose, Bld: 159 mg/dL — ABNORMAL HIGH (ref 70–99)
Sodium: 132 mEq/L — ABNORMAL LOW (ref 135–145)

## 2011-12-17 LAB — BASIC METABOLIC PANEL
BUN: 19 mg/dL (ref 6–23)
CO2: 27 mEq/L (ref 19–32)
Calcium: 7.9 mg/dL — ABNORMAL LOW (ref 8.4–10.5)
Creatinine, Ser: 0.45 mg/dL — ABNORMAL LOW (ref 0.50–1.10)
GFR calc non Af Amer: >90 mL/min (ref >90)
Glucose, Bld: 122 mg/dL — ABNORMAL HIGH (ref 70–99)
Sodium: 133 mEq/L — ABNORMAL LOW (ref 135–145)

## 2011-12-17 LAB — CBC WITH DIFFERENTIAL/PLATELET
Eosinophils Absolute: 0.1 10*3/uL (ref 0.0–0.7)
Eosinophils Relative: 1 % (ref 0–5)
HCT: 25.2 % — ABNORMAL LOW (ref 36.0–46.0)
Lymphs Abs: 0.7 10*3/uL (ref 0.7–4.0)
MCH: 31.3 pg (ref 26.0–34.0)
MCV: 94 fL (ref 78.0–100.0)
Monocytes Absolute: 1.1 10*3/uL — ABNORMAL HIGH (ref 0.1–1.0)
Monocytes Relative: 9 % (ref 3–12)
Platelets: 332 10*3/uL (ref 150–400)
RBC: 2.68 MIL/uL — ABNORMAL LOW (ref 3.87–5.11)

## 2011-12-17 LAB — VANCOMYCIN, TROUGH: Vancomycin Tr: 14.8 ug/mL (ref 10.0–20.0)

## 2011-12-17 NOTE — Progress Notes (Signed)
[name] is a 78 y.o. female admitted to [facility] on 11/21/2011 with abdominal pain from perforated sigmoid diverticulum with peritonitis and pelvic abscess requiring laparotomy.  She has hx of B cell lymphoma (Last chemo 11/04/11).  Post-op course complicated by acute systolic CHF, prolonged stay on ventilator requiring tracheostomy, volume overload, agitated delirium, and A fib with RVR.  She was transferred 12/09/2011 to [facility] for wound care, rehab, and airway support.  PCCM consulted 9/18 to assist with tracheostomy management.  Line/tube: Trach 9/13 >>  Tests/events: Echo 9/02>>EF 25 to 30%, diffuse hypokinesis, grade 1 diastolic dysfx, mod AR, mild MR, mod TR, PAS 37 mmHg  SUBJECTIVE: Tolerating trach collar.  C/o cough causing difficulty with sleep.  Still has secretions.not tolerating passing mirror valve.  OBJECTIVE: 28% aerosol trach collar, sats 95%.  General - No distress HEENT - trach site clean with clear secretions Cardiac - s1s2 regular Chest - scattered rhonchi Abd - soft, non tender Ext - ankle edema, This is decreased. Neuro - awake, follows commands Psych - normal mood, behavior  Lab Results  Component Value Date   WBC 11.8* 12/17/2011   HGB 8.4* 12/17/2011   HCT 25.2* 12/17/2011   MCV 94.0 12/17/2011   PLT 332 12/17/2011   Lab Results  Component Value Date   CREATININE 0.45* 12/17/2011   BUN 19 12/17/2011   NA 133* 12/17/2011   K 3.6 12/17/2011   CL 99 12/17/2011   CO2 27 12/17/2011   Lab Results  Component Value Date   ALT 9 12/15/2011   AST 13 12/15/2011   ALKPHOS 144* 12/15/2011   BILITOT 0.1* 12/15/2011   Dg Swallowing Func-speech Pathology  12/16/2011  CLINICAL DATA: swallowing function   FLUOROSCOPY FOR SWALLOWING FUNCTION STUDY:  Fluoroscopy was provided for swallowing function study, which was  administered by a speech pathologist.  Final results and recommendations  from this study are contained within the speech pathology report.    agree, aeration in the  right has improved, persistent left-sided effusion/atelectasis   ASSESSMENT/PLAN:  A: Acute respiratory failure in setting of peritonitis/septic shock complicated by acute systolic CHF, volume overload, agitated delirium.  This necessitated tracheostomy.  Since liberated from ventilator. P: Continue trach collar, remove vent from room. Change trach to 6 cuffless. Continue scheduled BD's for now Keep in negative fluid balance as tolerated. Failed speech evaluation, would recommend rechecking once trach is changed. Abx per primary team. F/u Echo first week of October. Once changed will consider capping trach if swallow evaluation is passed.  Alyson Reedy, M.D. [facility] Pulmonary/Critical Care Medicine. Pager: [phone]. After hours pager: [phone].

## 2011-12-18 ENCOUNTER — Ambulatory Visit: Payer: No Typology Code available for payment source | Admitting: Oncology

## 2011-12-18 ENCOUNTER — Other Ambulatory Visit: Payer: No Typology Code available for payment source | Admitting: Lab

## 2011-12-18 NOTE — Progress Notes (Signed)
[name] is a 78 y.o. female admitted to [facility] on 11/21/2011 with abdominal pain from perforated sigmoid diverticulum with peritonitis and pelvic abscess requiring laparotomy.  She has hx of B cell lymphoma (Last chemo 11/04/11).  Post-op course complicated by acute systolic CHF, prolonged stay on ventilator requiring tracheostomy, volume overload, agitated delirium, and A fib with RVR.  She was transferred 12/09/2011 to [facility] for wound care, rehab, and airway support.  PCCM consulted 9/18 to assist with tracheostomy management.  Line/tube: Trach 9/13 >>  Tests/events: Echo 9/02>>EF 25 to 30%, diffuse hypokinesis, grade 1 diastolic dysfx, mod AR, mild MR, mod TR, PAS 37 mmHg  SUBJECTIVE: Tolerating trach collar.  C/o cough causing difficulty with sleep.  Still has secretions.not tolerating passing mirror valve.  OBJECTIVE: 28% aerosol trach collar, sats 95%.  General - No distress HEENT - trach site clean with clear secretions Cardiac - s1s2 regular Chest - scattered rhonchi Abd - soft, non tender Ext - ankle edema, This is decreased. Neuro - awake, follows commands Psych - normal mood, behavior  Lab Results  Component Value Date   WBC 11.8* 12/17/2011   HGB 8.4* 12/17/2011   HCT 25.2* 12/17/2011   MCV 94.0 12/17/2011   PLT 332 12/17/2011   Lab Results  Component Value Date   CREATININE 0.45* 12/17/2011   BUN 19 12/17/2011   NA 133* 12/17/2011   K 3.6 12/17/2011   CL 99 12/17/2011   CO2 27 12/17/2011   Lab Results  Component Value Date   ALT 9 12/15/2011   AST 13 12/15/2011   ALKPHOS 144* 12/15/2011   BILITOT 0.1* 12/15/2011   No results found.agree, aeration in the right has improved, persistent left-sided effusion/atelectasis   ASSESSMENT/PLAN:  A: Acute respiratory failure in setting of peritonitis/septic shock complicated by acute systolic CHF, volume overload, agitated delirium.  This necessitated tracheostomy.  Since liberated from ventilator. P: Continue trach collar,  remove vent from room. Change trach to 6 cuffless. Continue scheduled BD's for now Keep in negative fluid balance as tolerated. Failed speech evaluation, would recommend rechecking once trach is changed. Abx per primary team. F/u Echo first week of October. F/U on swallow evaluation, if passes it today (after change of trach to a cuffless) then will consider capping.  Alyson Reedy, M.D. [facility] Pulmonary/Critical Care Medicine. Pager: [phone]. After hours pager: [phone].

## 2011-12-19 DIAGNOSIS — Z430 Encounter for attention to tracheostomy: Secondary | ICD-10-CM

## 2011-12-19 LAB — CBC WITH DIFFERENTIAL/PLATELET
Basophils Relative: 0 % (ref 0–1)
Eosinophils Absolute: 0.2 10*3/uL (ref 0.0–0.7)
HCT: 24 % — ABNORMAL LOW (ref 36.0–46.0)
Hemoglobin: 7.8 g/dL — ABNORMAL LOW (ref 12.0–15.0)
Lymphocytes Relative: 7 % — ABNORMAL LOW (ref 12–46)
Lymphs Abs: 0.7 10*3/uL (ref 0.7–4.0)
MCH: 30.1 pg (ref 26.0–34.0)
MCHC: 32.5 g/dL (ref 30.0–36.0)
MCV: 92.7 fL (ref 78.0–100.0)
Neutro Abs: 7.8 10*3/uL — ABNORMAL HIGH (ref 1.7–7.7)

## 2011-12-19 LAB — BASIC METABOLIC PANEL
BUN: 24 mg/dL — ABNORMAL HIGH (ref 6–23)
Chloride: 98 mEq/L (ref 96–112)
Creatinine, Ser: 0.43 mg/dL — ABNORMAL LOW (ref 0.50–1.10)
GFR calc Af Amer: >90 mL/min (ref >90)

## 2011-12-19 LAB — PREPARE RBC (CROSSMATCH)

## 2011-12-19 LAB — ABO/RH: ABO/RH(D): A POS

## 2011-12-19 NOTE — Progress Notes (Signed)
[name] is a 78 y.o. female admitted to [facility] on 11/21/2011 with abdominal pain from perforated sigmoid diverticulum with peritonitis and pelvic abscess requiring laparotomy.  She has hx of B cell lymphoma (Last chemo 11/04/11).  Post-op course complicated by acute systolic CHF, prolonged stay on ventilator requiring tracheostomy, volume overload, agitated delirium, and A fib with RVR.  She was transferred 12/09/2011 to [facility] for wound care, rehab, and airway support.  PCCM consulted 9/18 to assist with tracheostomy management.  Line/tube: Trach 9/13 >>  Tests/events: Echo 9/02>>EF 25 to 30%, diffuse hypokinesis, grade 1 diastolic dysfx, mod AR, mild MR, mod TR, PAS 37 mmHg  SUBJECTIVE: Tolerating trach collar.  C/o cough causing difficulty with sleep.  Still has secretions.not tolerating passing mirror valve.  OBJECTIVE: 28% aerosol trach collar, sats 95%.  General - No distress HEENT - trach site clean with clear secretions Cardiac - s1s2 regular Chest - scattered rhonchi Abd - soft, non tender Ext - ankle edema, This is decreased. Neuro - awake, follows commands Psych - normal mood, behavior  Lab Results  Component Value Date   WBC 9.5 12/19/2011   HGB 7.8* 12/19/2011   HCT 24.0* 12/19/2011   MCV 92.7 12/19/2011   PLT 308 12/19/2011   Lab Results  Component Value Date   CREATININE 0.43* 12/19/2011   BUN 24* 12/19/2011   NA 133* 12/19/2011   K 3.3* 12/19/2011   CL 98 12/19/2011   CO2 28 12/19/2011   Lab Results  Component Value Date   ALT 9 12/15/2011   AST 13 12/15/2011   ALKPHOS 144* 12/15/2011   BILITOT 0.1* 12/15/2011   No results found.agree, aeration in the right has improved, persistent left-sided effusion/atelectasis   ASSESSMENT/PLAN:  A: Acute respiratory failure in setting of peritonitis/septic shock complicated by acute systolic CHF, volume overload, agitated delirium.  This necessitated tracheostomy.  Since liberated from ventilator. P: Continue trach collar,  remove vent from room. Changed trach to 6 cuffless and well tolerated. Continue scheduled BD's for now Keep in negative fluid balance as tolerated. Failed speech evaluation, would recommend rechecking now that trach has been changed to a cuffless 6. Abx per primary team. F/u Echo first week of October. F/U on swallow evaluation, if passes it (after change of trach to a cuffless) then will consider capping progressing towards decannulation.  Alyson Reedy, M.D. [facility] Pulmonary/Critical Care Medicine. Pager: [phone]. After hours pager: [phone].

## 2011-12-19 NOTE — Procedures (Signed)
First Trach Change  Patient consent obtained verbally.  Size 6 cuffed trach removed with some difficulty then size 6 cuffless trach plaed without difficulty.  O2 sats normal and capnography positive.  Patient tolerated the procedure relatively well.  Alyson Reedy, M.D. [facility] Pulmonary/Critical Care Medicine. Pager: [phone]. After hours pager: [phone].

## 2011-12-20 LAB — HEMOGLOBIN AND HEMATOCRIT, BLOOD: Hemoglobin: 8 g/dL — ABNORMAL LOW (ref 12.0–15.0)

## 2011-12-22 LAB — BASIC METABOLIC PANEL
BUN: 21 mg/dL (ref 6–23)
Chloride: 95 mEq/L — ABNORMAL LOW (ref 96–112)
Glucose, Bld: 132 mg/dL — ABNORMAL HIGH (ref 70–99)
Potassium: 3.4 mEq/L — ABNORMAL LOW (ref 3.5–5.1)

## 2011-12-22 NOTE — Progress Notes (Signed)
[name] is a 78 y.o. female admitted to [facility] on 11/21/2011 with abdominal pain from perforated sigmoid diverticulum with peritonitis and pelvic abscess requiring laparotomy.  She has hx of B cell lymphoma (Last chemo 11/04/11).  Post-op course complicated by acute systolic CHF, prolonged stay on ventilator requiring tracheostomy, volume overload, agitated delirium, and A fib with RVR.  She was transferred 12/09/2011 to [facility] for wound care, rehab, and airway support.  PCCM consulted 9/18 to assist with tracheostomy management.  Line/tube: Trach 9/13 >>  Tests/events: Echo 9/02>>EF 25 to 30%, diffuse hypokinesis, grade 1 diastolic dysfx, mod AR, mild MR, mod TR, PAS 37 mmHg  SUBJECTIVE: Tolerating trach collar.  C/o cough causing difficulty with sleep.   tolerating PM -valve.  OBJECTIVE: 28% aerosol trach collar, sats 95%.  General - No distress HEENT - trach site clean with clear secretions Cardiac - s1s2 regular Chest - scattered rhonchi Abd - soft, non tender Ext - ankle edema, This is decreased. Neuro - awake, follows commands Psych - normal mood, behavior  Lab Results  Component Value Date   WBC 9.5 12/19/2011   HGB 6.3* 12/22/2011   HCT 22.8* 12/22/2011   MCV 92.7 12/19/2011   PLT 308 12/19/2011   Lab Results  Component Value Date   CREATININE 0.42* 12/22/2011   BUN 21 12/22/2011   NA 130* 12/22/2011   K 3.4* 12/22/2011   CL 95* 12/22/2011   CO2 28 12/22/2011   Lab Results  Component Value Date   ALT 9 12/15/2011   AST 13 12/15/2011   ALKPHOS 144* 12/15/2011   BILITOT 0.1* 12/15/2011   No results found.agree, aeration in the right has improved, persistent left-sided effusion/atelectasis   ASSESSMENT/PLAN:  A: Acute respiratory failure in setting of peritonitis/septic shock complicated by acute systolic CHF, volume overload, agitated delirium.  This necessitated tracheostomy.  Since liberated from ventilator. P: Continue trach collar,Changed trach to 6 cuffless  Continue  scheduled BD's for now Keep in negative fluid balance as tolerated. Abx per primary team. F/u Echo first week of October. F/U swallow evaluation -consider capping progressing towards decannulation once she makes progress with PT & swallowing  Anemia - Hb 6.3 Transfusion per primary service  Cyril Mourning MD. FCCP. [place] Pulmonary & Critical care Pager [phone] If no response call [phone]

## 2011-12-23 ENCOUNTER — Other Ambulatory Visit (HOSPITAL_COMMUNITY): Payer: Medicare Other

## 2011-12-23 DIAGNOSIS — K631 Perforation of intestine (nontraumatic): Secondary | ICD-10-CM | POA: Diagnosis not present

## 2011-12-23 DIAGNOSIS — L0390 Cellulitis, unspecified: Secondary | ICD-10-CM | POA: Diagnosis not present

## 2011-12-23 DIAGNOSIS — R1011 Right upper quadrant pain: Secondary | ICD-10-CM | POA: Diagnosis not present

## 2011-12-23 LAB — BASIC METABOLIC PANEL
Calcium: 8.6 mg/dL (ref 8.4–10.5)
Creatinine, Ser: 0.44 mg/dL — ABNORMAL LOW (ref 0.50–1.10)
GFR calc Af Amer: >90 mL/min (ref >90)
GFR calc non Af Amer: >90 mL/min (ref >90)

## 2011-12-23 LAB — CBC WITH DIFFERENTIAL/PLATELET
Basophils Absolute: 0 10*3/uL (ref 0.0–0.1)
Eosinophils Absolute: 0.2 10*3/uL (ref 0.0–0.7)
Lymphs Abs: 0.9 10*3/uL (ref 0.7–4.0)
MCH: 28.6 pg (ref 26.0–34.0)
MCHC: 33.9 g/dL (ref 30.0–36.0)
MCV: 84.3 fL (ref 78.0–100.0)
Monocytes Absolute: 0.5 10*3/uL (ref 0.1–1.0)
Monocytes Relative: 6 % (ref 3–12)
Platelets: 263 10*3/uL (ref 150–400)
RDW: 19.2 % — ABNORMAL HIGH (ref 11.5–15.5)
WBC: 8.8 10*3/uL (ref 4.0–10.5)

## 2011-12-23 LAB — FERRITIN: Ferritin: 837 ng/mL — ABNORMAL HIGH (ref 10–291)

## 2011-12-23 LAB — IRON AND TIBC
Saturation Ratios: 9 % — ABNORMAL LOW (ref 20–55)
TIBC: 171 ug/dL — ABNORMAL LOW (ref 250–470)
UIBC: 156 ug/dL (ref 125–400)

## 2011-12-23 LAB — URINALYSIS, ROUTINE W REFLEX MICROSCOPIC
Bilirubin Urine: NEGATIVE
Ketones, ur: NEGATIVE mg/dL
Leukocytes, UA: NEGATIVE
Nitrite: NEGATIVE
Protein, ur: NEGATIVE mg/dL
Urobilinogen, UA: 0.2 mg/dL (ref 0.0–1.0)
pH: 6 (ref 5.0–8.0)

## 2011-12-23 LAB — TYPE AND SCREEN

## 2011-12-23 MED ORDER — IOHEXOL 300 MG/ML  SOLN
100.0000 mL | Freq: Once | INTRAMUSCULAR | Status: AC | PRN
  Administered 2011-12-23: 100 mL via INTRAVENOUS

## 2011-12-24 LAB — COMPREHENSIVE METABOLIC PANEL
AST: 22 U/L (ref 0–37)
BUN: 23 mg/dL (ref 6–23)
CO2: 29 mEq/L (ref 19–32)
Calcium: 8.5 mg/dL (ref 8.4–10.5)
Chloride: 97 mEq/L (ref 96–112)
Creatinine, Ser: 0.45 mg/dL — ABNORMAL LOW (ref 0.50–1.10)
GFR calc Af Amer: >90 mL/min (ref >90)
GFR calc non Af Amer: >90 mL/min (ref >90)
Total Bilirubin: 0.3 mg/dL (ref 0.3–1.2)

## 2011-12-24 LAB — CBC WITH DIFFERENTIAL/PLATELET
Basophils Absolute: 0 10*3/uL (ref 0.0–0.1)
Eosinophils Relative: 3 % (ref 0–5)
HCT: 32.6 % — ABNORMAL LOW (ref 36.0–46.0)
Hemoglobin: 10.8 g/dL — ABNORMAL LOW (ref 12.0–15.0)
Lymphocytes Relative: 10 % — ABNORMAL LOW (ref 12–46)
MCHC: 33.1 g/dL (ref 30.0–36.0)
MCV: 85.6 fL (ref 78.0–100.0)
Monocytes Absolute: 0.7 10*3/uL (ref 0.1–1.0)
Monocytes Relative: 10 % (ref 3–12)
RDW: 18.7 % — ABNORMAL HIGH (ref 11.5–15.5)

## 2011-12-24 LAB — LIPASE, BLOOD: Lipase: 20 U/L (ref 11–59)

## 2011-12-24 LAB — URINE CULTURE

## 2011-12-24 LAB — AMYLASE: Amylase: 28 U/L (ref 0–105)

## 2011-12-24 NOTE — Progress Notes (Signed)
[name] is a 77 y.o. female admitted to [facility] on 11/21/2011 with abdominal pain from perforated sigmoid diverticulum with peritonitis and pelvic abscess requiring laparotomy.  She has hx of B cell lymphoma (Last chemo 11/04/11).  Post-op course complicated by acute systolic CHF, prolonged stay on ventilator requiring tracheostomy, volume overload, agitated delirium, and A fib with RVR.  She was transferred 12/09/2011 to [facility] for wound care, rehab, and airway support.  PCCM consulted 9/18 to assist with tracheostomy management.  Line/tube: Trach 9/13 >>  Tests/events: Echo 9/02>>EF 25 to 30%, diffuse hypokinesis, grade 1 diastolic dysfx, mod AR, mild MR, mod TR, PAS 37 mmHg Ct abd 10/1 Significant decrease in the volume of subcutaneous emphysema. Significant decrease in the size  of fluid collections adjacent to the left-sided ostomy, with one approximately 1 cm fluid collection remaining   SUBJECTIVE: Tolerating trach collar.    tolerating PM -valve. Denies pain  OBJECTIVE: 28% aerosol trach collar, sats 95%.  General - No distress HEENT - trach site clean with clear secretions Cardiac - s1s2 regular Chest - scattered rhonchi Abd - soft, non tender Ext - ankle edema, This is decreased. Neuro - awake, follows commands Psych - normal mood, behavior  Lab Results  Component Value Date   WBC 7.3 12/24/2011   HGB 10.8* 12/24/2011   HCT 32.6* 12/24/2011   MCV 85.6 12/24/2011   PLT 253 12/24/2011   Lab Results  Component Value Date   CREATININE 0.45* 12/24/2011   BUN 23 12/24/2011   NA 133* 12/24/2011   K 3.5 12/24/2011   CL 97 12/24/2011   CO2 29 12/24/2011   Lab Results  Component Value Date   ALT 29 12/24/2011   AST 22 12/24/2011   ALKPHOS 404* 12/24/2011   BILITOT 0.3 12/24/2011   Ct Abdomen Pelvis W Contrast  12/23/2011  *RADIOLOGY REPORT*  Clinical Data: Abdominal pain with history of abscess, complaining of right upper quadrant pain with history of bowel perforation; the patient  underwent sigmoid colectomy and ostomy; history of B-cell lymphoma  CT ABDOMEN AND PELVIS WITH CONTRAST  Technique:  Multidetector CT imaging of the abdomen and pelvis was performed following the standard protocol during bolus administration of intravenous contrast.  Contrast:  OMNIPAQUE IOHEXOL 300 MG/ML  SOLN 80 ml  Comparison: CT abdomen and pelvis performed 12/09/2011  Findings: There are continued bilateral moderate pleural effusions with underlying compressive atelectasis, similar to the prior study.  No acute findings involving the liver, with a tiny cyst in the anterior right lobe measuring about 4 mm, stable.  Gallbladder, spleen, and pancreas show no acute abnormalities.  The adrenal glands and kidneys demonstrate no significant abnormalities.  The aorta is not dilated.  Again identified is a left-sided colostomy.  Fluid collections adjacent to the subcutaneous portion of the colostomy have essentially resolved, with one tiny fluid collection remaining now measuring at most 1 cm.  There remains an element of subcutaneous emphysema along the anterolateral left abdominal wall, from the level of the diaphragm to the pelvic inlet.  The volume of subcutaneous air has significantly decreased.  There is a small focus of subcutaneous emphysema in the anterior left pelvic wall as well, also significantly smaller when compared to the prior study.  There are no significantly dilated loops of bowel. Oral contrast extends throughout the colon into the loop of ostomy.  There is minimal fluid in the pelvis.  There is no significant residual intraperitoneal gas.  There is a Foley catheter within the  bladder.  There is also a air within the bladder as previously described.  IMPRESSION:  1.  Continued moderate bilateral pleural effusions with lower lobe compressive atelectasis as a result. 2.  No evidence of bowel obstruction.  Significant decrease in the volume of subcutaneous emphysema.  Significant decrease in the size  of fluid collections adjacent to the left-sided ostomy, with one approximately 1 cm fluid collection remaining.   Original Report Authenticated By: Otilio Carpen, M.D.      ASSESSMENT/PLAN:  A: Acute respiratory failure in setting of peritonitis/septic shock complicated by acute systolic CHF, volume overload, agitated delirium.  This necessitated tracheostomy.  Since liberated from ventilator. P: Continue trach collar, trach  6 cuffless  Continue scheduled BD's for now Keep in negative fluid balance with lasix Abx per primary team - unclear why on vanc - can dc F/u Echo first week of October. F/U swallow evaluation -consider capping progressing towards decannulation once she makes progress with PT & swallowing  Anemia - improved Hb Malnutrition, protein calorie - alb 1.7   Cyril Mourning MD. FCCP. [place] Pulmonary & Critical care Pager [phone] If no response call [phone]

## 2011-12-25 ENCOUNTER — Telehealth: Payer: Self-pay | Admitting: Emergency Medicine

## 2011-12-25 LAB — POTASSIUM: Potassium: 3.8 mEq/L (ref 3.5–5.1)

## 2011-12-26 ENCOUNTER — Ambulatory Visit: Payer: Medicare Other | Admitting: Oncology

## 2011-12-26 ENCOUNTER — Other Ambulatory Visit: Payer: Medicare Other | Admitting: Lab

## 2011-12-26 ENCOUNTER — Ambulatory Visit: Payer: Medicare Other

## 2011-12-26 NOTE — Progress Notes (Signed)
[name] is a 78 y.o. female admitted to [facility] on 11/21/2011 with abdominal pain from perforated sigmoid diverticulum with peritonitis and pelvic abscess requiring laparotomy.  She has hx of B cell lymphoma (Last chemo 11/04/11).  Post-op course complicated by acute systolic CHF, prolonged stay on ventilator requiring tracheostomy, volume overload, agitated delirium, and A fib with RVR.  She was transferred 12/09/2011 to [facility] for wound care, rehab, and airway support.  PCCM consulted 9/18 to assist with tracheostomy management.  Line/tube: Trach 9/13 >>  Tests/events: Echo 9/02>>EF 25 to 30%, diffuse hypokinesis, grade 1 diastolic dysfx, mod AR, mild MR, mod TR, PAS 37 mmHg Ct abd 10/1 Significant decrease in the volume of subcutaneous emphysema. Significant decrease in the size  of fluid collections adjacent to the left-sided ostomy, with one approximately 1 cm fluid collection remaining   SUBJECTIVE: Tolerating trach collar.    tolerating PM -valve.   Vital signs reviewed. Abnormal values will appear under impression plan section.    General - No distress HEENT - trach site clean with clear secretions Cardiac - s1s2 regular Chest - scattered rhonchi Abd - soft, non tender Ext - ankle edema, This is decreased. Some early foot drop Neuro - awake, follows commands Psych - normal mood, behavior  Lab Results  Component Value Date   WBC 7.3 12/24/2011   HGB 10.8* 12/24/2011   HCT 32.6* 12/24/2011   MCV 85.6 12/24/2011   PLT 253 12/24/2011   Lab Results  Component Value Date   CREATININE 0.45* 12/24/2011   BUN 23 12/24/2011   NA 133* 12/24/2011   K 3.8 12/25/2011   CL 97 12/24/2011   CO2 29 12/24/2011   Lab Results  Component Value Date   ALT 29 12/24/2011   AST 22 12/24/2011   ALKPHOS 404* 12/24/2011   BILITOT 0.3 12/24/2011   No results found.   ASSESSMENT/PLAN:  A: Acute respiratory failure in setting of peritonitis/septic shock complicated by acute systolic CHF, volume  overload, agitated delirium.  This necessitated tracheostomy.  Since liberated from ventilator. P: Continue trach collar, trach  6 cuffless  Continue scheduled BD's for now Keep in negative fluid balance with lasix Abx per primary team  F/u Echo for recovery of LV fn F/U swallow evaluation - progressing towards decannulation once she makes progress with PT & swallowing  Brett Canales Minor ACNP [facility] Pager [phone] till 3 pm If no answer page [phone] 12/26/2011, 1:03 PM  Independently examined pt, evaluated data & formulated above care plan with NP  [facility]

## 2011-12-27 ENCOUNTER — Ambulatory Visit: Payer: Medicare Other

## 2011-12-29 ENCOUNTER — Other Ambulatory Visit (HOSPITAL_COMMUNITY): Payer: Medicare Other

## 2011-12-29 DIAGNOSIS — J9600 Acute respiratory failure, unspecified whether with hypoxia or hypercapnia: Secondary | ICD-10-CM | POA: Diagnosis not present

## 2011-12-29 LAB — HEMOGLOBIN AND HEMATOCRIT, BLOOD
HCT: 36.7 % (ref 36.0–46.0)
Hemoglobin: 12.1 g/dL (ref 12.0–15.0)

## 2011-12-29 LAB — BASIC METABOLIC PANEL
Calcium: 9.5 mg/dL (ref 8.4–10.5)
GFR calc non Af Amer: 86 mL/min — ABNORMAL LOW (ref >90)
Glucose, Bld: 92 mg/dL (ref 70–99)
Potassium: 3.5 mEq/L (ref 3.5–5.1)
Sodium: 136 mEq/L (ref 135–145)

## 2011-12-29 NOTE — Progress Notes (Signed)
[name] is a 78 y.o. female admitted to [facility] on 11/21/2011 with abdominal pain from perforated sigmoid diverticulum with peritonitis and pelvic abscess requiring laparotomy.  She has hx of B cell lymphoma (Last chemo 11/04/11).  Post-op course complicated by acute systolic CHF, prolonged stay on ventilator requiring tracheostomy, volume overload, agitated delirium, and A fib with RVR.  She was transferred 12/09/2011 to [facility] for wound care, rehab, and airway support.  PCCM consulted 9/18 to assist with tracheostomy management.  Line/tube: Trach 9/13 >>  Tests/events: Echo 9/02>>EF 25 to 30%, diffuse hypokinesis, grade 1 diastolic dysfx, mod AR, mild MR, mod TR, PAS 37 mmHg Ct abd 10/1 Significant decrease in the volume of subcutaneous emphysema. Significant decrease in the size  of fluid collections adjacent to the left-sided ostomy, with one approximately 1 cm fluid collection remaining   SUBJECTIVE: On RA, Trach capped most of weekend prior.  Tolerating well.  Able to clear secretions.     Vital signs: reviewed. Abnormal values will appear under impression plan section.    General - No distress HEENT - trach site clean with clear secretions Cardiac - s1s2 regular Chest - scattered rhonchi Abd - soft, non tender Ext - warm/dry Neuro - awake, follows commands Psych - normal mood, behavior  Lab Results  Component Value Date   WBC 7.3 12/24/2011   HGB 12.1 12/29/2011   HCT 36.7 12/29/2011   MCV 85.6 12/24/2011   PLT 253 12/24/2011   Lab Results  Component Value Date   CREATININE 0.59 12/29/2011   BUN 17 12/29/2011   NA 136 12/29/2011   K 3.5 12/29/2011   CL 98 12/29/2011   CO2 28 12/29/2011   Lab Results  Component Value Date   ALT 29 12/24/2011   AST 22 12/24/2011   ALKPHOS 404* 12/24/2011   BILITOT 0.3 12/24/2011   Dg Chest Port 1 View  12/29/2011  *RADIOLOGY REPORT*  Clinical Data: Respiratory failure  PORTABLE CHEST - 1 VIEW  Comparison: 12/15/2011  Findings: A tracheostomy  tube remains unchanged and well positioned.  There is a left PICC with its tip in the superior vena cava and a right anterior chest wall power Port-A-Cath with its tip in the superior vena cava, both also unchanged.  There is opacity at the left lung base mildly improved likely a combination of a small effusion with either atelectasis, infiltrate or a combination.  Mild scarring is noted in the upper lobes, stable.  The lungs are otherwise clear.  The cardiac silhouette is normal in size and configuration.  No mediastinal or hilar mass or adenopathy.  IMPRESSION: Mild improvement in the left lower lobe opacity, likely a combination of a small effusion with atelectasis or infiltrate. Lines and tubes remain well positioned.   Original Report Authenticated By: Domenic Moras, M.D.      ASSESSMENT/PLAN:  A:  Acute respiratory failure in setting of peritonitis/septic shock complicated by acute systolic CHF, volume overload, agitated delirium.  S/p tracheostomy.    P: Continue trach collar, trach  6 cuffless-->consider decannulation 10/7 as improving. Pulmonary Attending to review.  Continue scheduled BD's for now Keep in negative fluid balance with lasix Abx per primary team  F/u Echo for recovery of LV fn F/U swallow evaluation    Canary Brim, NP-C [place] Pulmonary & Critical Care Pgr: [phone] or [phone]  Recommend capping the trach from now til Wednesday AM with continuous O2 monitor.  If able to tolerate then will decannulate on Wednesday.  Patient  seen and examined, agree with above note.  I dictated the care and orders written for this patient under my direction.  Koren Bound, M.D. [phone]

## 2011-12-30 NOTE — Progress Notes (Signed)
[name] is a 78 y.o. female admitted to [facility] on 11/21/2011 with abdominal pain from perforated sigmoid diverticulum with peritonitis and pelvic abscess requiring laparotomy.  She has hx of B cell lymphoma (Last chemo 11/04/11).  Post-op course complicated by acute systolic CHF, prolonged stay on ventilator requiring tracheostomy, volume overload, agitated delirium, and A fib with RVR.  She was transferred 12/09/2011 to [facility] for wound care, rehab, and airway support.  PCCM consulted 9/18 to assist with tracheostomy management.  Line/tube: Trach 9/13 >>  Tests/events: Echo 9/02>>EF 25 to 30%, diffuse hypokinesis, grade 1 diastolic dysfx, mod AR, mild MR, mod TR, PAS 37 mmHg Ct abd 10/1 Significant decrease in the volume of subcutaneous emphysema. Significant decrease in the size  of fluid collections adjacent to the left-sided ostomy, with one approximately 1 cm fluid collection remaining   SUBJECTIVE: On RA, Trach capped most of weekend prior.  Tolerating well.  Able to clear secretions.     Vital signs: reviewed. Abnormal values will appear under impression plan section.    General - No distress HEENT - trach site clean with clear secretions Cardiac - s1s2 regular Chest - scattered rhonchi Abd - soft, non tender Ext - warm/dry Neuro - awake, follows commands Psych - normal mood, behavior  Lab Results  Component Value Date   WBC 7.3 12/24/2011   HGB 12.1 12/29/2011   HCT 36.7 12/29/2011   MCV 85.6 12/24/2011   PLT 253 12/24/2011   Lab Results  Component Value Date   CREATININE 0.59 12/29/2011   BUN 17 12/29/2011   NA 136 12/29/2011   K 3.5 12/29/2011   CL 98 12/29/2011   CO2 28 12/29/2011   Lab Results  Component Value Date   ALT 29 12/24/2011   AST 22 12/24/2011   ALKPHOS 404* 12/24/2011   BILITOT 0.3 12/24/2011   Dg Chest Port 1 View  12/29/2011  *RADIOLOGY REPORT*  Clinical Data: Respiratory failure  PORTABLE CHEST - 1 VIEW  Comparison: 12/15/2011  Findings: A tracheostomy  tube remains unchanged and well positioned.  There is a left PICC with its tip in the superior vena cava and a right anterior chest wall power Port-A-Cath with its tip in the superior vena cava, both also unchanged.  There is opacity at the left lung base mildly improved likely a combination of a small effusion with either atelectasis, infiltrate or a combination.  Mild scarring is noted in the upper lobes, stable.  The lungs are otherwise clear.  The cardiac silhouette is normal in size and configuration.  No mediastinal or hilar mass or adenopathy.  IMPRESSION: Mild improvement in the left lower lobe opacity, likely a combination of a small effusion with atelectasis or infiltrate. Lines and tubes remain well positioned.   Original Report Authenticated By: Domenic Moras, M.D.      ASSESSMENT/PLAN:  A:  Acute respiratory failure in setting of peritonitis/septic shock complicated by acute systolic CHF, volume overload, agitated delirium.  S/p tracheostomy.    P: De-cannulate 10/8 and will evaluate 10/9 with c x r. Continue scheduled BD's for now Keep in negative fluid balance with lasix Abx per primary team  F/u Echo for recovery of LV fn F/U swallow evaluation  Brett Canales Minor ACNP [facility] Pager [phone] till 3 pm If no answer page [phone] 12/30/2011, 1:35 PM  Will de-cannulate today, f/u CXR in AM and continue rehab.  Patient seen and examined, agree with above note.  I dictated the care and orders  written for this patient under my direction.  Koren Bound, M.D. [phone]

## 2011-12-31 ENCOUNTER — Other Ambulatory Visit (HOSPITAL_COMMUNITY): Payer: Medicare Other

## 2011-12-31 DIAGNOSIS — J9819 Other pulmonary collapse: Secondary | ICD-10-CM | POA: Diagnosis not present

## 2011-12-31 DIAGNOSIS — Z430 Encounter for attention to tracheostomy: Secondary | ICD-10-CM | POA: Diagnosis not present

## 2011-12-31 LAB — OCCULT BLOOD X 1 CARD TO LAB, STOOL: Fecal Occult Bld: NEGATIVE

## 2011-12-31 NOTE — Progress Notes (Signed)
[name] is a 78 y.o. female admitted to [facility] on 11/21/2011 with abdominal pain from perforated sigmoid diverticulum with peritonitis and pelvic abscess requiring laparotomy.  She has hx of B cell lymphoma (Last chemo 11/04/11).  Post-op course complicated by acute systolic CHF, prolonged stay on ventilator requiring tracheostomy, volume overload, agitated delirium, and A fib with RVR.  She was transferred 12/09/2011 to [facility] for wound care, rehab, and airway support.  PCCM consulted 9/18 to assist with tracheostomy management.  Line/tube: Trach 9/13 >>  Tests/events: Echo 9/02>>EF 25 to 30%, diffuse hypokinesis, grade 1 diastolic dysfx, mod AR, mild MR, mod TR, PAS 37 mmHg Ct abd 10/1 Significant decrease in the volume of subcutaneous emphysema. Significant decrease in the size  of fluid collections adjacent to the left-sided ostomy, with one approximately 1 cm fluid collection remaining   SUBJECTIVE: On RA, Trach capped most of weekend prior.  Tolerating well.  Able to clear secretions.     Vital signs: reviewed. Abnormal values will appear under impression plan section.    General - No distress HEENT - trach site clean with clear secretions Cardiac - s1s2 regular Chest - scattered rhonchi Abd - soft, non tender Ext - warm/dry Neuro - awake, follows commands Psych - normal mood, behavior  Lab Results  Component Value Date   WBC 7.3 12/24/2011   HGB 12.1 12/29/2011   HCT 36.7 12/29/2011   MCV 85.6 12/24/2011   PLT 253 12/24/2011   Lab Results  Component Value Date   CREATININE 0.59 12/29/2011   BUN 17 12/29/2011   NA 136 12/29/2011   K 3.5 12/29/2011   CL 98 12/29/2011   CO2 28 12/29/2011   Lab Results  Component Value Date   ALT 29 12/24/2011   AST 22 12/24/2011   ALKPHOS 404* 12/24/2011   BILITOT 0.3 12/24/2011   Dg Chest Port 1 View  12/31/2011  *RADIOLOGY REPORT*  Clinical Data: Tracheostomy tube removal.  PORTABLE CHEST - 1 VIEW  Comparison: 12/29/2011.  Findings: The  power port is stable.  The left PICC line is stable. The tracheostomy tube has been removed.  No complicating features. Minimal streaky left basilar atelectasis.  No infiltrates or effusions.  No pneumothorax.  IMPRESSION:  1.  Stable support apparatus. 2.  Removal of tracheostomy tube. 3.  Minimal streaky left basilar atelectasis   Original Report Authenticated By: P. Loralie Champagne, M.D.      ASSESSMENT/PLAN:  A:  Acute respiratory failure in setting of peritonitis/septic shock complicated by acute systolic CHF, volume overload, agitated delirium.  S/p tracheostomy.    P: De-cannulated 10/8 and doing well. CXR with some atelectasis, recommend IS and flutter valve. Continue scheduled BD's for now. Fluid even. Abx per primary team. Swallow evaluation noted.  PCCM will sign off, please call back if needed.  Alyson Reedy, M.D. [facility] Pulmonary/Critical Care Medicine. Pager: [phone]. After hours pager: [phone].

## 2012-01-01 ENCOUNTER — Other Ambulatory Visit: Payer: Medicare Other | Admitting: Lab

## 2012-01-01 ENCOUNTER — Ambulatory Visit: Payer: Medicare Other | Admitting: Oncology

## 2012-01-01 LAB — CBC WITH DIFFERENTIAL/PLATELET
Basophils Relative: 0 % (ref 0–1)
HCT: 38.6 % (ref 36.0–46.0)
Hemoglobin: 13.1 g/dL (ref 12.0–15.0)
Lymphocytes Relative: 4 % — ABNORMAL LOW (ref 12–46)
MCHC: 33.9 g/dL (ref 30.0–36.0)
Monocytes Absolute: 1 10*3/uL (ref 0.1–1.0)
Monocytes Relative: 9 % (ref 3–12)
Neutro Abs: 9.6 10*3/uL — ABNORMAL HIGH (ref 1.7–7.7)
Neutrophils Relative %: 86 % — ABNORMAL HIGH (ref 43–77)
RBC: 4.59 MIL/uL (ref 3.87–5.11)
WBC: 11.2 10*3/uL — ABNORMAL HIGH (ref 4.0–10.5)

## 2012-01-01 LAB — AMYLASE: Amylase: 39 U/L (ref 0–105)

## 2012-01-01 LAB — COMPREHENSIVE METABOLIC PANEL
AST: 24 U/L (ref 0–37)
Albumin: 2.7 g/dL — ABNORMAL LOW (ref 3.5–5.2)
BUN: 11 mg/dL (ref 6–23)
Calcium: 9.7 mg/dL (ref 8.4–10.5)
Chloride: 99 mEq/L (ref 96–112)
Creatinine, Ser: 0.69 mg/dL (ref 0.50–1.10)
Total Protein: 6.3 g/dL (ref 6.0–8.3)

## 2012-01-01 LAB — LIPASE, BLOOD: Lipase: 27 U/L (ref 11–59)

## 2012-01-02 ENCOUNTER — Other Ambulatory Visit (HOSPITAL_COMMUNITY): Payer: Medicare Other

## 2012-01-02 ENCOUNTER — Ambulatory Visit: Payer: Medicare Other

## 2012-01-02 DIAGNOSIS — R109 Unspecified abdominal pain: Secondary | ICD-10-CM | POA: Diagnosis not present

## 2012-01-02 LAB — BASIC METABOLIC PANEL
Chloride: 96 mEq/L (ref 96–112)
GFR calc Af Amer: >90 mL/min (ref >90)
GFR calc non Af Amer: 80 mL/min — ABNORMAL LOW (ref >90)
Potassium: 3.7 mEq/L (ref 3.5–5.1)
Sodium: 135 mEq/L (ref 135–145)

## 2012-01-02 LAB — CBC WITH DIFFERENTIAL/PLATELET
Basophils Absolute: 0 10*3/uL (ref 0.0–0.1)
Eosinophils Absolute: 0 10*3/uL (ref 0.0–0.7)
HCT: 37.3 % (ref 36.0–46.0)
Lymphocytes Relative: 2 % — ABNORMAL LOW (ref 12–46)
Monocytes Relative: 5 % (ref 3–12)
Neutrophils Relative %: 93 % — ABNORMAL HIGH (ref 43–77)
Platelets: 498 10*3/uL — ABNORMAL HIGH (ref 150–400)
RDW: 17 % — ABNORMAL HIGH (ref 11.5–15.5)
WBC: 29 10*3/uL — ABNORMAL HIGH (ref 4.0–10.5)

## 2012-01-02 LAB — CK: Total CK: 13 U/L (ref 7–177)

## 2012-01-03 LAB — CBC WITH DIFFERENTIAL/PLATELET
Basophils Absolute: 0 10*3/uL (ref 0.0–0.1)
Basophils Relative: 0 % (ref 0–1)
HCT: 34.9 % — ABNORMAL LOW (ref 36.0–46.0)
Hemoglobin: 11.5 g/dL — ABNORMAL LOW (ref 12.0–15.0)
Lymphocytes Relative: 5 % — ABNORMAL LOW (ref 12–46)
Monocytes Absolute: 1 10*3/uL (ref 0.1–1.0)
Monocytes Relative: 7 % (ref 3–12)
Neutro Abs: 12.4 10*3/uL — ABNORMAL HIGH (ref 1.7–7.7)
Neutrophils Relative %: 86 % — ABNORMAL HIGH (ref 43–77)
WBC: 14.5 10*3/uL — ABNORMAL HIGH (ref 4.0–10.5)

## 2012-01-03 LAB — BASIC METABOLIC PANEL
BUN: 15 mg/dL (ref 6–23)
CO2: 25 mEq/L (ref 19–32)
Chloride: 96 mEq/L (ref 96–112)
Creatinine, Ser: 0.65 mg/dL (ref 0.50–1.10)
GFR calc Af Amer: >90 mL/min (ref >90)
Potassium: 3.2 mEq/L — ABNORMAL LOW (ref 3.5–5.1)

## 2012-01-04 ENCOUNTER — Other Ambulatory Visit (HOSPITAL_COMMUNITY): Payer: Medicare Other

## 2012-01-04 DIAGNOSIS — R112 Nausea with vomiting, unspecified: Secondary | ICD-10-CM | POA: Diagnosis not present

## 2012-01-04 DIAGNOSIS — R109 Unspecified abdominal pain: Secondary | ICD-10-CM | POA: Diagnosis not present

## 2012-01-04 LAB — BASIC METABOLIC PANEL
CO2: 28 mEq/L (ref 19–32)
Calcium: 9.8 mg/dL (ref 8.4–10.5)
Chloride: 89 mEq/L — ABNORMAL LOW (ref 96–112)
Glucose, Bld: 123 mg/dL — ABNORMAL HIGH (ref 70–99)
Sodium: 133 mEq/L — ABNORMAL LOW (ref 135–145)

## 2012-01-04 MED ORDER — IOHEXOL 300 MG/ML  SOLN
80.0000 mL | Freq: Once | INTRAMUSCULAR | Status: AC | PRN
  Administered 2012-01-04: 80 mL via INTRAVENOUS

## 2012-01-04 MED ORDER — TECHNETIUM TC 99M MEBROFENIN IV KIT
5.0000 | PACK | Freq: Once | INTRAVENOUS | Status: AC | PRN
  Administered 2012-01-04: 5 via INTRAVENOUS

## 2012-01-04 MED ORDER — IOHEXOL 300 MG/ML  SOLN: 20.0000 mL | Freq: Once | INTRAMUSCULAR | Status: DC | PRN

## 2012-01-05 DIAGNOSIS — R1310 Dysphagia, unspecified: Secondary | ICD-10-CM

## 2012-01-05 DIAGNOSIS — B3781 Candidal esophagitis: Secondary | ICD-10-CM

## 2012-01-05 LAB — CBC
HCT: 39 % (ref 36.0–46.0)
Hemoglobin: 13.3 g/dL (ref 12.0–15.0)
MCH: 28.5 pg (ref 26.0–34.0)
MCV: 83.7 fL (ref 78.0–100.0)
RBC: 4.66 MIL/uL (ref 3.87–5.11)
WBC: 14.2 10*3/uL — ABNORMAL HIGH (ref 4.0–10.5)

## 2012-01-05 LAB — BASIC METABOLIC PANEL
BUN: 15 mg/dL (ref 6–23)
CO2: 27 mEq/L (ref 19–32)
Calcium: 9.5 mg/dL (ref 8.4–10.5)
Chloride: 92 mEq/L — ABNORMAL LOW (ref 96–112)
Creatinine, Ser: 0.6 mg/dL (ref 0.50–1.10)
Glucose, Bld: 106 mg/dL — ABNORMAL HIGH (ref 70–99)

## 2012-01-05 NOTE — Consult Note (Signed)
Gastroenterology Consult: 11:29 AM 01/05/2012   Referring Provider: Dr Gwenyth Bouillon. [facility]  Primary Care Physician:  Abner Greenspan, MD in [place] Primary Gastroenterologist:   Dr Clent Ridges who does not have privileges at [facility].  Onc:  Dr Park Breed  Reason for Consultation:  Nausea, vomiting and upper/epigastric abdominal and SS pain.   HPI: [name] is a 78 y.o. female.  Hx low grade B-cell lymphoma undergoing chemotherapy at time of 8/30 - 9/16 hospital admission for perforated diverticular disease.  Underwent exploratory lapartotomy, sigmoid colectomy, descending colostomy and drainage pelvic abscesses 11/22/11. Prolonged hospital stay due to sepsis. Required vent support, trach placement.  Grew e coli from urine in 11/28/11 but I do not find any positive cultures from other body fluids.  Went to [facility] 9/16 where she made steady progress and trach was decannulated last week.was consuming Dyspahgia 2/thin liquid diet without incidence. Was for discharge home on 10/11.  However on 12/31/10 began having c/o the above pain and n/v of clear/ bilious material.  Phenergen is more effective than Zofran.  Pt is not eating and takes only minimal liquids.  Alk phos is 452, but otherwise LFTs and Lipase are normal. CT scan shows persistent but improving presence of  gas in left lower anterior abdominal wall.  No abscesses seen.  HIDA scan shows GB EF of 33%, prompt hepatic uptake and SB contrast activity by 25 minutes.   Says she has chronic dysphagia and has undergone EGD/[place] dilatations by Dr Clent Ridges along with colonoscopies.  I see one EGD/dilt in 10/2003.     Past Medical History  Diagnosis Date  . Cancer 07/08/11    follicular B cell lymphoma  . TIA (transient ischemic attack)   . Arthritis   . Hypertension   . Esophageal abnormality     strictures  . Arthritis 09/04/2011  . Dehydration 11/20/2011  . Diarrhea 11/20/2011  .  Complication of anesthesia   . PONV (postoperative nausea and vomiting)     in the 1980's  . GERD (gastroesophageal reflux disease)   . Esophageal stricture     x 2    Past Surgical History  Procedure Date  . Appendectomy   . Parotidectomy w/ neck dissection total   . Tonsillectomy   . Abdominal hysterectomy     left ovaries  . Laparotomy 11/22/2011    Procedure: EXPLORATORY LAPAROTOMY;  Surgeon: Velora Heckler, MD;  Location: [facility];  Service: General;  Laterality: N/A;  drainage of pelvic abscess  . Colostomy revision 11/22/2011    Procedure: COLON RESECTION SIGMOID;  Surgeon: Velora Heckler, MD;  Location: [facility];  Service: General;  Laterality: N/A;  . Colostomy 11/22/2011    Procedure: COLOSTOMY;  Surgeon: Velora Heckler, MD;  Location: [facility];  Service: General;  Laterality: N/A;  desending colostomy    Prior to Admission medications   Medication Sig Start Date End Date Taking? Authorizing Provider  fat emulsion 20 % infusion Inject 240 mLs into the vein continuous. 12/09/11   Blenda Mounts, NP  fentaNYL (SUBLIMAZE) 0.05 MG/ML injection Inject 0.5-1 mLs (25-50 mcg total) into the vein every 2 (two) hours as needed for severe pain. 12/09/11   Blenda Mounts, NP    Scheduled Meds: Plavix, Fentanyl patch, Fe SO4, Folic acid, IV furosemide, Lidoderm patch, Lisinopril, Lorazepam at HS,  Metoprolol, Mirtazipine, Pottassium Chloride, Sertraline, MVI IV Protonix 40 mg BID,  Oxycodone prn,   Allergies as of 12/09/2011 - Review Complete 11/21/2011  Allergen Reaction Noted  .  Codeine Hives 07/22/2011  . Other Nausea And Vomiting 08/13/2011  . Penicillins Hives 07/22/2011  . Sulfa antibiotics Hives 07/22/2011    Family History  Problem Relation Age of Onset  . Cancer Mother 4    gastric  . Hypertension Father     heart disease  . Cancer Maternal Aunt 18    breast cancer  . Cancer Maternal Uncle 60    colon    History   Social History  . Marital Status: Married    Spouse  Name: N/A    Number of Children: N/A  . Years of Education: N/A   Occupational History  . Not on file.   Social History Main Topics  . Smoking status: Never Smoker   . Smokeless tobacco: Never Used  . Alcohol Use: No  . Drug Use: No  . Sexually Active: Yes    REVIEW OF SYSTEMS: Brown stool, volume is smaller as she is not taking pos No belly pain at present, last episode was early this AM. No dysuria. Productive cough, DOE    PHYSICAL EXAM: Vital signs in last 24 hours:    General: frail, ill, drowsy elderly WF Head:  No facial swelling or asymmetry  Eyes:  No icterus or pallor Ears:  Slightly HOH  Nose:  No discharge or congestion Mouth:  No lesions or exudates, lips are chapped, MM are dry.  No visible blood Neck:  No mass, former trach site bandaged Lungs:  Clear B.  Cough at times Heart: RRR.  No MRG Abdomen:  Soft, NT, ND, no mass.  Ostomy bag on left with soft, brown stool:  Benign.  Active BS.   Rectal: non done   Musc/Skeltl: no joint erythema or deformity Extremities:  No pedal edema  Neurologic:  Drowsy, difficult to arouse, but apropriate and not disoriented when awake.  No tremor Skin:  No rash, no significant bruising or bleeding Tattoos:  none Nodes:  No adenopathy at groins or neck   Psych:  Pleasant, not agitated.   Intake/Output from previous day:   Intake/Output this shift:    LAB RESULTS:  Basename 01/05/12 0500 01/03/12 0620 01/02/12 1618  WBC 14.2* 14.5* 29.0*  HGB 13.3 11.5* 12.7  HCT 39.0 34.9* 37.3  PLT 514* 414* 498*   BMET Lab Results  Component Value Date   NA 131* 01/05/2012   NA 133* 01/04/2012   NA 133* 01/03/2012   K 3.4* 01/05/2012   K 2.8* 01/04/2012   K 3.2* 01/03/2012   CL 92* 01/05/2012   CL 89* 01/04/2012   CL 96 01/03/2012   CO2 27 01/05/2012   CO2 28 01/04/2012   CO2 25 01/03/2012   GLUCOSE 106* 01/05/2012   GLUCOSE 123* 01/04/2012   GLUCOSE 83 01/03/2012   BUN 15 01/05/2012   BUN 16 01/04/2012   BUN  15 01/03/2012   CREATININE 0.60 01/05/2012   CREATININE 0.63 01/04/2012   CREATININE 0.65 01/03/2012   CALCIUM 9.5 01/05/2012   CALCIUM 9.8 01/04/2012   CALCIUM 9.4 01/03/2012   LFT  Ref. Range 01/01/2012 18:40  Alkaline Phosphatase Latest Range: 39-117 U/L 452 (H)  Albumin Latest Range: 3.5-5.2 g/dL 2.7 (L)  Amylase Latest Range: 0-105 U/L 39  Lipase Latest Range: 11-59 U/L 27  AST Latest Range: 0-37 U/L 24  ALT Latest Range: 0-35 U/L 30  Total Protein Latest Range: 6.4-8.3 g/dL 6.3  Total Bilirubin Latest Range: 0.3-1.2 mg/dL 0.3   PT/INR Lab Results  Component Value Date  INR 1.10 12/05/2011   UA   12/23/11    negative  C Diff negative on 9/6 and 12/01/11  RADIOLOGY STUDIES: Ct Abdomen W Contrast 01/04/2012 Comparison: Multiple priors, most recently 12/23/2011.  Findings:  Lung Bases: Small bilateral pleural effusions (left greater than right).  Dependent atelectasis in the lower lobes of the lungs bilaterally.  Infusion catheter tip terminating in the superior right atrium.  Abdomen:  Subcentimeter low attenuation lesions in the segments seven and eight of the lumbar are unchanged and remain too small to definitively characterize.  The enhanced appearance of the gallbladder, pancreas, spleen and, bilateral adrenal glands and bilateral kidneys is unremarkable.  Within the visualized abdomen there is no ascites or pneumoperitoneum and no pathologic distension of small bowel.  A colostomy is noted in the left lower quadrant of the abdomen.  There is a small amount of gas in the left anterior abdominal wall adjacent to the colostomy site, decreased compared to the prior study from 12/23/2011.  Musculoskeletal: There are no aggressive appearing lytic or blastic lesions noted in the visualized portions of the skeleton. Multilevel degenerative disc disease, most grade L2-L3 where there is 4 mm of retrolisthesis of L2 upon L3.  Multilevel facet arthropathy in the lumbar spine.  IMPRESSION: 1.   Persistent gas tracking along the lower left anterior abdominal wall.  This is of uncertain etiology and significance, but could to suggest potential communication with bowel, or alternatively the presence of gas-forming organisms within these soft tissues. Regardless, this finding has decreased over the prior several examinations. Additionally, there is no associated fluid collection is identified at this time suggest residual abscess. 2.  No other potentially acute findings within the abdomen to account for the patient's symptoms. 3. Small bilateral pleural effusions with mild dependent atelectasis in the lower lobes of the lungs bilaterally. 4.  Additional incidental findings, as above.   Original Report Authenticated By: Florencia Reasons, M.D.    Nm Hepato W/eject Fract 01/04/2012  *RADIOLOGY REPORT*  Clinical Data:  Abdominal pain.  Nausea and vomiting.  NUCLEAR MEDICINE HEPATOBILIARY IMAGING WITH GALLBLADDER EF  Technique:  Sequential images of the abdomen were obtained out to 60 minutes following intravenous administration of radiopharmaceutical. After oral ingestion of Ensure, gallbladder ejection fraction was determined.  Radiopharmaceutical:  5.0 mCi Tc-29m Choletec  Comparison: None.  Findings: Prompt radiopharmaceutical uptake by the liver is seen. The liver is normal appearance. There is prompt biliary excretion activity.  Biliary activity is seen within the small bowel initially on the 25- minute image.  Gallbladder activity is seen initially on the 35- minute image and shows progressive filling.  Gallbladder ejection fraction:  33%. Normal gallbladder ejection fraction with Ensure is greater than 33%.  The patient did not experience symptoms after oral ingestion of Ensure.  IMPRESSION:  1. Negative hepatobiliary scan.  No evidence of cystic duct or biliary obstruction. 2. Gallbladder ejection fraction of 33% is at the lower limit of normal.   Original Report Authenticated By: Danae Orleans, M.D.     12/31/2011    PORTABLE CHEST - 1 VIEW  1. Stable support apparatus.  2. Removal of tracheostomy tube.  3. Minimal streaky left basilar atelectasis    IMPRESSION: *  Nausea, emesis, upper abdominal pain Alk phos is elevated. Wonder if this has to do with her lymphoma? Alk phos in 70s to 107 in June through early August 2013. *  Hx esophageal strictures, dilated in past.  Not clear that she is having  significant dysphagia, though this could be possible.  However, given acute onset, infectious esophagitis is more likely.  *  Perforated diverticulum,  S/p exlap/sigmoid colectomy and colostomy 8/31.  ST shows continuing improvement.  *  Trach requiring respiratory failure, removed last week.  *  Low grade B-cell Lymphoma, was receiving Chemo prior to August admission.  *  Chronic Plavix.  Hx TIA   PLAN: *  Per Dr Christella Hartigan.    LOS: 27 days   [name]  01/05/2012, 11:29 AM Pager: [phone]      ________________________________________________________________________  Corinda G[facility]ler GI MD note:  I personally examined the patient, reviewed the data and agree with the assessment and plan described above.  Will plan on EGD tomorrow.  Will not be able to dilate due to current plavix use but EGD can see if she has candida infection, viral infection, other causes for dysphagia/odynophagia.  If stricture noted, would then have to hold plavix for 5 days and then proceed with EGD, dilation.   Rob Bunting, MD [facility] Pager [phone]

## 2012-01-06 ENCOUNTER — Encounter
Admission: AD | Disposition: A | Discharge status: Skilled Nursing Facility | Payer: Self-pay | Source: Ambulatory Visit | Attending: Internal Medicine

## 2012-01-06 ENCOUNTER — Encounter: Payer: Self-pay | Admitting: Gastroenterology

## 2012-01-06 DIAGNOSIS — B3781 Candidal esophagitis: Secondary | ICD-10-CM

## 2012-01-06 HISTORY — PX: ESOPHAGOGASTRODUODENOSCOPY: SHX5428

## 2012-01-06 HISTORY — DX: Candidal esophagitis: B37.81

## 2012-01-06 LAB — CBC WITH DIFFERENTIAL/PLATELET
Eosinophils Relative: 1 % (ref 0–5)
HCT: 40.7 % (ref 36.0–46.0)
Hemoglobin: 14.2 g/dL (ref 12.0–15.0)
Lymphocytes Relative: 4 % — ABNORMAL LOW (ref 12–46)
MCHC: 34.9 g/dL (ref 30.0–36.0)
MCV: 82.9 fL (ref 78.0–100.0)
Monocytes Absolute: 0.7 10*3/uL (ref 0.1–1.0)
Monocytes Relative: 5 % (ref 3–12)
Neutro Abs: 13 10*3/uL — ABNORMAL HIGH (ref 1.7–7.7)
WBC: 14.4 10*3/uL — ABNORMAL HIGH (ref 4.0–10.5)

## 2012-01-06 LAB — BASIC METABOLIC PANEL
BUN: 15 mg/dL (ref 6–23)
CO2: 26 mEq/L (ref 19–32)
Calcium: 9.6 mg/dL (ref 8.4–10.5)
Chloride: 92 mEq/L — ABNORMAL LOW (ref 96–112)
Creatinine, Ser: 0.63 mg/dL (ref 0.50–1.10)

## 2012-01-06 SURGERY — EGD (ESOPHAGOGASTRODUODENOSCOPY)
Anesthesia: Moderate Sedation

## 2012-01-06 MED ORDER — MIDAZOLAM HCL 5 MG/ML IJ SOLN
INTRAMUSCULAR | Status: AC
  Filled 2012-01-06: qty 2

## 2012-01-06 MED ORDER — FENTANYL CITRATE 0.05 MG/ML IJ SOLN
INTRAMUSCULAR | Status: AC
  Filled 2012-01-06: qty 2

## 2012-01-06 MED ORDER — MIDAZOLAM HCL 10 MG/2ML IJ SOLN
INTRAMUSCULAR | Status: DC | PRN
  Administered 2012-01-06 (×2): 1 mg via INTRAVENOUS
  Administered 2012-01-06: .5 mg via INTRAVENOUS

## 2012-01-06 MED ORDER — FENTANYL CITRATE 0.05 MG/ML IJ SOLN
INTRAMUSCULAR | Status: DC | PRN
  Administered 2012-01-06 (×2): 12.5 ug via INTRAVENOUS

## 2012-01-06 MED ORDER — SODIUM CHLORIDE 0.9 % IV SOLN: INTRAVENOUS | Status: DC

## 2012-01-06 MED ORDER — BUTAMBEN-TETRACAINE-BENZOCAINE 2-2-14 % EX AERO
INHALATION_SPRAY | CUTANEOUS | Status: DC | PRN
  Administered 2012-01-06: 1 via TOPICAL

## 2012-01-06 NOTE — Op Note (Signed)
[facility] [address] [place], [address]   ENDOSCOPY PROCEDURE REPORT  PATIENT: Rebecca, Jefferson  MR#: [id number] BIRTHDATE: 12/26/1931 , 79  yrs. old GENDER: Female ENDOSCOPIST: Rachael Fee, MD REFERRED BY:  [facility] MD PROCEDURE DATE:  01/06/2012 PROCEDURE:  EGD, diagnostic ASA CLASS:     Class III INDICATIONS:  dysphagia. MEDICATIONS: Fentanyl 25 mcg IV and Versed 2 mg IV TOPICAL ANESTHETIC: Cetacaine Spray  DESCRIPTION OF PROCEDURE: After the risks benefits and alternatives of the procedure were thoroughly explained, informed consent was obtained.  The Pentax Gastroscope [id number] endoscope was introduced through the mouth and advanced to the second portion of the duodenum. Without limitations.  The instrument was slowly withdrawn as the mucosa was fully examined.  There was yellowish plaque in distal esophagus consistent with candida infection.  There was a lot of food caught behind her epiglottis, this was suctioned clear.  The examination was otherwise normal.  Retroflexed views revealed no abnormalities. The scope was then withdrawn from the patient and the procedure completed. COMPLICATIONS: There were no complications.  ENDOSCOPIC IMPRESSION: Candida esophagitis.  RECOMMENDATIONS: Will start diflucan (10days) to treat candida esophagitis.   eSigned:  Rachael Fee, MD 01/06/2012 1:19 PM   CC: Charlotte Sanes, MD  (her usual gastroenterologist)

## 2012-01-06 NOTE — H&P (View-Only) (Signed)
[name] is a 79 y.o. female admitted to [facility] on 11/21/2011 with abdominal pain from perforated sigmoid diverticulum with peritonitis and pelvic abscess requiring laparotomy.  She has hx of B cell lymphoma (Last chemo 11/04/11).  Post-op course complicated by acute systolic CHF, prolonged stay on ventilator requiring tracheostomy, volume overload, agitated delirium, and A fib with RVR.  She was transferred 12/09/2011 to [facility] for wound care, rehab, and airway support.  PCCM consulted 9/18 to assist with tracheostomy management.  Line/tube: Trach 9/13 >>  Tests/events: Echo 9/02>>EF 25 to 30%, diffuse hypokinesis, grade 1 diastolic dysfx, mod AR, mild MR, mod TR, PAS 37 mmHg Ct abd 10/1 Significant decrease in the volume of subcutaneous emphysema. Significant decrease in the size  of fluid collections adjacent to the left-sided ostomy, with one approximately 1 cm fluid collection remaining   SUBJECTIVE: On RA, Trach capped most of weekend prior.  Tolerating well.  Able to clear secretions.     Vital signs: reviewed. Abnormal values will appear under impression plan section.    General - No distress HEENT - trach site clean with clear secretions Cardiac - s1s2 regular Chest - scattered rhonchi Abd - soft, non tender Ext - warm/dry Neuro - awake, follows commands Psych - normal mood, behavior  Lab Results  Component Value Date   WBC 7.3 12/24/2011   HGB 12.1 12/29/2011   HCT 36.7 12/29/2011   MCV 85.6 12/24/2011   PLT 253 12/24/2011   Lab Results  Component Value Date   CREATININE 0.59 12/29/2011   BUN 17 12/29/2011   NA 136 12/29/2011   K 3.5 12/29/2011   CL 98 12/29/2011   CO2 28 12/29/2011   Lab Results  Component Value Date   ALT 29 12/24/2011   AST 22 12/24/2011   ALKPHOS 404* 12/24/2011   BILITOT 0.3 12/24/2011   Dg Chest Port 1 View  12/31/2011  *RADIOLOGY REPORT*  Clinical Data: Tracheostomy tube removal.  PORTABLE CHEST - 1 VIEW  Comparison: 12/29/2011.  Findings: The  power port is stable.  The left PICC line is stable. The tracheostomy tube has been removed.  No complicating features. Minimal streaky left basilar atelectasis.  No infiltrates or effusions.  No pneumothorax.  IMPRESSION:  1.  Stable support apparatus. 2.  Removal of tracheostomy tube. 3.  Minimal streaky left basilar atelectasis   Original Report Authenticated By: P. MARK GALLERANI, M.D.      ASSESSMENT/PLAN:  A:  Acute respiratory failure in setting of peritonitis/septic shock complicated by acute systolic CHF, volume overload, agitated delirium.  S/p tracheostomy.    P: De-cannulated 10/8 and doing well. CXR with some atelectasis, recommend IS and flutter valve. Continue scheduled BD's for now. Fluid even. Abx per primary team. Swallow evaluation noted.  PCCM will sign off, please call back if needed.  Wesam G. Yacoub, M.D. [place] Pulmonary/Critical Care Medicine. Pager: [phone]. After hours pager: [phone]. 

## 2012-01-06 NOTE — Interval H&P Note (Signed)
History and Physical Interval Note:  01/06/2012 12:39 PM  [name]  has presented today for surgery, with the diagnosis of nausea, vomitting and abdominal pain.  The various methods of treatment have been discussed with the patient and family. After consideration of risks, benefits and other options for treatment, the patient has consented to  Procedure(s) (LRB) with comments: ESOPHAGOGASTRODUODENOSCOPY (EGD) (N/A) as a surgical intervention .  The patient's history has been reviewed, patient examined, no change in status, stable for surgery.  I have reviewed the patient's chart and labs.  Questions were answered to the patient's satisfaction.     Rob Bunting

## 2012-01-07 LAB — CBC WITH DIFFERENTIAL/PLATELET
Basophils Relative: 0 % (ref 0–1)
Eosinophils Absolute: 0.2 10*3/uL (ref 0.0–0.7)
Eosinophils Relative: 1 % (ref 0–5)
HCT: 41.2 % (ref 36.0–46.0)
Hemoglobin: 14 g/dL (ref 12.0–15.0)
MCH: 28.4 pg (ref 26.0–34.0)
MCHC: 34 g/dL (ref 30.0–36.0)
MCV: 83.6 fL (ref 78.0–100.0)
Monocytes Absolute: 1 10*3/uL (ref 0.1–1.0)
Monocytes Relative: 6 % (ref 3–12)

## 2012-01-07 LAB — URINE MICROSCOPIC-ADD ON

## 2012-01-07 LAB — BASIC METABOLIC PANEL
BUN: 20 mg/dL (ref 6–23)
Calcium: 9.6 mg/dL (ref 8.4–10.5)
Chloride: 92 mEq/L — ABNORMAL LOW (ref 96–112)
Creatinine, Ser: 0.62 mg/dL (ref 0.50–1.10)
GFR calc Af Amer: >90 mL/min (ref >90)
GFR calc non Af Amer: 85 mL/min — ABNORMAL LOW (ref >90)

## 2012-01-07 LAB — URINALYSIS, ROUTINE W REFLEX MICROSCOPIC
Ketones, ur: 15 mg/dL — AB
Nitrite: NEGATIVE
pH: 6 (ref 5.0–8.0)

## 2012-01-07 NOTE — Progress Notes (Signed)
     [name] Daily Rounding Note 01/07/2012, 9:35 AM  SUBJECTIVE:       Still having some nausea and dysphagia.   Husband provides hx of prior EGD/esophageal dilatation x 3 by Dr Clent Ridges and that pt has had chronic dysphagia but in past it was not as acute and not associated with nausea.   OBJECTIVE:         Vital signs in last 24 hours:    Temp:  98.6   Pulse Rate: 94    Resp: 18    BP:177/77     SpO2: 97%   General: Frail and chronically/acutely ill looking   Heart: RRR Chest: diminished BS B. Abdomen:  Thin, soft, NT, ND.  No mass.  Extremities: no CCE Neuro/Psych:  Depressed, somewhat anxious.   Lab Results:  Basename 01/07/12 0420 01/06/12 0500 01/05/12 0500  WBC 16.1* 14.4* 14.2*  HGB 14.0 14.2 13.3  HCT 41.2 40.7 39.0  PLT 400 542* 514*   BMET  Basename 01/07/12 0420 01/06/12 0500 01/05/12 0500  NA 131* 131* 131*  K 3.6 3.7 3.4*  CL 92* 92* 92*  CO2 24 26 27   GLUCOSE 114* 142* 106*  BUN 20 15 15   CREATININE 0.62 0.63 0.60  CALCIUM 9.6 9.6 9.5    ASSESMENT: *  Dysphagia, anorexia, upper abd pain and nausea.  EGD 01/06/12 with Candida esophagitis.  Day 1/10 of Diflucan, has not yet gotten dose of this.  Dr Christella Hartigan suctioned food debris caught behind epiglottis.  No evidence of recurrent esoph stricture.  Long hx of dysmotility.  Suspect esophageal dysmotility.  *  Leukocytosis. Last UA 12/23/11. Last CXR was 12/31/11 following removal of trach.  * Perforated diverticulum, S/p exlap/sigmoid colectomy and colostomy 8/31. ST shows continuing improvement.  * Trach requiring respiratory failure, removed last week.  * Low grade B-cell Lymphoma, was receiving Chemo prior to August admission.  * Chronic Plavix. Hx TIA   PLAN: *  Finish 10 days Diflucan, hopefully this will get her swallowing back to baseline where it was PTA and earlier last week.  May need to change to IV Diflucan if unable to tolerate the pill.  *  Jefferson follow up with Dr Matthias Hughs.    LOS: 29 days    [name]  01/07/2012, 9:35 AM Pager: [phone]    ________________________________________________________________________  [facility] G[facility] MD note:  I personally examined the patient, reviewed the data and agree with the assessment and plan described above.  Observe for response to diflucan.   Rob Bunting, MD [facility] Pager [phone]

## 2012-01-08 ENCOUNTER — Ambulatory Visit: Payer: No Typology Code available for payment source | Admitting: Oncology

## 2012-01-08 ENCOUNTER — Other Ambulatory Visit: Payer: No Typology Code available for payment source | Admitting: Lab

## 2012-01-08 LAB — CBC WITH DIFFERENTIAL/PLATELET
Basophils Absolute: 0 10*3/uL (ref 0.0–0.1)
Basophils Relative: 0 % (ref 0–1)
Eosinophils Absolute: 0.6 10*3/uL (ref 0.0–0.7)
MCH: 28.2 pg (ref 26.0–34.0)
MCHC: 33.6 g/dL (ref 30.0–36.0)
Neutrophils Relative %: 82 % — ABNORMAL HIGH (ref 43–77)
Platelets: 475 10*3/uL — ABNORMAL HIGH (ref 150–400)
RBC: 5.07 MIL/uL (ref 3.87–5.11)

## 2012-01-08 LAB — BASIC METABOLIC PANEL
GFR calc Af Amer: >90 mL/min (ref >90)
GFR calc non Af Amer: 80 mL/min — ABNORMAL LOW (ref >90)
Potassium: 2.6 mEq/L — CL (ref 3.5–5.1)
Sodium: 133 mEq/L — ABNORMAL LOW (ref 135–145)

## 2012-01-08 LAB — TROPONIN I
Troponin I: <0.3 ng/mL (ref <0.30)
Troponin I: <0.3 ng/mL (ref <0.30)

## 2012-01-08 LAB — CK TOTAL AND CKMB (NOT AT ARMC)
CK, MB: 3 ng/mL (ref 0.3–4.0)
Relative Index: INVALID (ref 0.0–2.5)
Total CK: 14 U/L (ref 7–177)

## 2012-01-08 LAB — MAGNESIUM: Magnesium: 1.5 mg/dL (ref 1.5–2.5)

## 2012-01-08 LAB — POTASSIUM: Potassium: 4.7 mEq/L (ref 3.5–5.1)

## 2012-01-08 NOTE — Progress Notes (Signed)
     [name], Rebecca AM  SUBJECTIVE:   Ate small amount of cheesecake this AM without problems but declining to eat her [place] breakfast tray.  No nausea currently.      OBJECTIVE:         Vital signs in last 24 hours:    T 97.8   Pulse 84   Resp 16   BP 129/78   Sats 95% General:  Frail and ill looking Heart:  RRR,  No MRG Chest:  Clear, diminished BS Abdomen:  Soft, NT, ND.  Colostomy mid/left abdomen with soft/liquid but not watery deep greenish stool.  Extremities: no pedal edema. Neuro/Psych:  Pleasant, seems depressed, anxious.    Lab Results:  Basename 01/08/12 0550 01/07/12 0420 01/06/12 0500  WBC 15.5* 16.1* 14.4*  HGB 14.3 14.0 14.2  HCT 42.6 41.2 40.7  PLT 475* 400 542*   BMET  Basename 01/08/12 0550 01/07/12 0420 01/06/12 0500  NA 133* 131* 131*  K 2.6* 3.6 3.7  CL 92* 92* 92*  CO2 24 24 26   GLUCOSE 92 114* 142*  BUN 19 20 15   CREATININE 0.74 0.62 0.63  CALCIUM 9.7 9.6 9.6    ASSESMENT: * Dysphagia, anorexia, upper abd pain and nausea. EGD 01/06/12 with Candida esophagitis. Day 2/10 of Diflucan, currently IV. Dr Christella Hartigan suctioned food debris caught behind epiglottis. No evidence of recurrent esoph stricture. Long hx of dysmotility. Suspect esophageal dysmotility.  * Leukocytosis.  Has multiple white cells, leukocytes in a contaminated urine spec (Many squams) Last CXR was 12/31/11 following removal of trach.  *  Hypokalemia.  * Perforated diverticulum, S/p exlap/sigmoid colectomy and colostomy 8/31. CT shows continuing improvement but persistent left lower anterior abdominal wall gas .  Abdominal exam benign.  * Trach requiring respiratory failure, removed last week.  * Low grade B-cell Lymphoma, was receiving Chemo prior to August admission.  * Chronic Plavix. Hx TIA    PLAN: *  Continue the Diflucan and Protonix, currently 40 mg po BID.  *  If swallowing does not improve, she may need Barium Esophagram to assess  presence/extent of dysmotility.  Additionally, CT imaging of the neck may be indicated given her history of Lymphoma.  *  Defer potassium supplementation to attending MD.   LOS: 30 days   Rebecca Moccasin  Jefferson, Rebecca AM Pager: [phone]    ________________________________________________________________________  [facility] MD note:  I personally examined the patient, reviewed the data and agree with the assessment and plan described above.  Should continue full course of diflucan.  If swallowing is not improving, then she needs swallow evaluation by speech (perhaps MBS) given retained food noted in oropharynx during EGD.   Rob Bunting, MD [facility] Pager [phone]

## 2012-01-09 LAB — CBC WITH DIFFERENTIAL/PLATELET
Basophils Absolute: 0 K/uL (ref 0.0–0.1)
Basophils Relative: 0 % (ref 0–1)
Eosinophils Absolute: 0.4 K/uL (ref 0.0–0.7)
Eosinophils Relative: 2 % (ref 0–5)
HCT: 41.4 % (ref 36.0–46.0)
Hemoglobin: 14.1 g/dL (ref 12.0–15.0)
Lymphocytes Relative: 7 % — ABNORMAL LOW (ref 12–46)
Lymphs Abs: 1.1 K/uL (ref 0.7–4.0)
MCH: 28.5 pg (ref 26.0–34.0)
MCHC: 34.1 g/dL (ref 30.0–36.0)
MCV: 83.8 fL (ref 78.0–100.0)
Monocytes Absolute: 1.5 K/uL — ABNORMAL HIGH (ref 0.1–1.0)
Monocytes Relative: 9 % (ref 3–12)
Neutro Abs: 13 K/uL — ABNORMAL HIGH (ref 1.7–7.7)
Neutrophils Relative %: 81 % — ABNORMAL HIGH (ref 43–77)
Platelets: 347 K/uL (ref 150–400)
RBC: 4.94 MIL/uL (ref 3.87–5.11)
RDW: 16.6 % — ABNORMAL HIGH (ref 11.5–15.5)
WBC: 16.1 K/uL — ABNORMAL HIGH (ref 4.0–10.5)

## 2012-01-09 LAB — BASIC METABOLIC PANEL
BUN: 23 mg/dL (ref 6–23)
Calcium: 9.7 mg/dL (ref 8.4–10.5)
Creatinine, Ser: 0.78 mg/dL (ref 0.50–1.10)
GFR calc Af Amer: >90 mL/min (ref >90)

## 2012-01-10 LAB — CBC
Hemoglobin: 12.9 g/dL (ref 12.0–15.0)
MCH: 28.2 pg (ref 26.0–34.0)
MCHC: 34.1 g/dL (ref 30.0–36.0)
Platelets: 403 10*3/uL — ABNORMAL HIGH (ref 150–400)

## 2012-01-10 LAB — COMPREHENSIVE METABOLIC PANEL
ALT: 24 U/L (ref 0–35)
AST: 24 U/L (ref 0–37)
Albumin: 2.9 g/dL — ABNORMAL LOW (ref 3.5–5.2)
Alkaline Phosphatase: 204 U/L — ABNORMAL HIGH (ref 39–117)
Calcium: 9.2 mg/dL (ref 8.4–10.5)
Potassium: 2.9 mEq/L — ABNORMAL LOW (ref 3.5–5.1)
Sodium: 130 mEq/L — ABNORMAL LOW (ref 135–145)
Total Protein: 5.7 g/dL — ABNORMAL LOW (ref 6.0–8.3)

## 2012-01-11 LAB — URINE CULTURE

## 2012-01-12 ENCOUNTER — Institutional Professional Consult (permissible substitution) (HOSPITAL_COMMUNITY): Payer: Medicare Other

## 2012-01-12 DIAGNOSIS — R5383 Other fatigue: Secondary | ICD-10-CM | POA: Diagnosis not present

## 2012-01-12 DIAGNOSIS — J90 Pleural effusion, not elsewhere classified: Secondary | ICD-10-CM | POA: Diagnosis not present

## 2012-01-12 DIAGNOSIS — J438 Other emphysema: Secondary | ICD-10-CM | POA: Diagnosis not present

## 2012-01-12 LAB — MAGNESIUM: Magnesium: 1.1 mg/dL — ABNORMAL LOW (ref 1.5–2.5)

## 2012-01-12 LAB — CBC
MCH: 27.9 pg (ref 26.0–34.0)
MCHC: 33.8 g/dL (ref 30.0–36.0)
MCV: 82.7 fL (ref 78.0–100.0)
Platelets: 337 10*3/uL (ref 150–400)
RDW: 16.7 % — ABNORMAL HIGH (ref 11.5–15.5)

## 2012-01-12 LAB — COMPREHENSIVE METABOLIC PANEL
ALT: 20 U/L (ref 0–35)
AST: 19 U/L (ref 0–37)
CO2: 23 mEq/L (ref 19–32)
Calcium: 9.2 mg/dL (ref 8.4–10.5)
Creatinine, Ser: 0.64 mg/dL (ref 0.50–1.10)
GFR calc non Af Amer: 84 mL/min — ABNORMAL LOW (ref >90)
Sodium: 128 mEq/L — ABNORMAL LOW (ref 135–145)
Total Protein: 5.6 g/dL — ABNORMAL LOW (ref 6.0–8.3)

## 2012-01-13 LAB — BASIC METABOLIC PANEL
CO2: 24 mEq/L (ref 19–32)
Calcium: 9.3 mg/dL (ref 8.4–10.5)
GFR calc non Af Amer: 83 mL/min — ABNORMAL LOW (ref >90)
Glucose, Bld: 102 mg/dL — ABNORMAL HIGH (ref 70–99)
Potassium: 3.2 mEq/L — ABNORMAL LOW (ref 3.5–5.1)
Sodium: 128 mEq/L — ABNORMAL LOW (ref 135–145)

## 2012-01-13 LAB — MAGNESIUM: Magnesium: 1.4 mg/dL — ABNORMAL LOW (ref 1.5–2.5)

## 2012-01-14 ENCOUNTER — Other Ambulatory Visit (HOSPITAL_COMMUNITY): Payer: Medicare Other

## 2012-01-14 DIAGNOSIS — E871 Hypo-osmolality and hyponatremia: Secondary | ICD-10-CM | POA: Diagnosis not present

## 2012-01-14 DIAGNOSIS — M129 Arthropathy, unspecified: Secondary | ICD-10-CM | POA: Diagnosis not present

## 2012-01-14 DIAGNOSIS — A499 Bacterial infection, unspecified: Secondary | ICD-10-CM | POA: Diagnosis not present

## 2012-01-14 DIAGNOSIS — R1310 Dysphagia, unspecified: Secondary | ICD-10-CM | POA: Diagnosis not present

## 2012-01-14 DIAGNOSIS — K219 Gastro-esophageal reflux disease without esophagitis: Secondary | ICD-10-CM | POA: Diagnosis not present

## 2012-01-14 DIAGNOSIS — K222 Esophageal obstruction: Secondary | ICD-10-CM | POA: Diagnosis not present

## 2012-01-14 DIAGNOSIS — K5732 Diverticulitis of large intestine without perforation or abscess without bleeding: Secondary | ICD-10-CM | POA: Diagnosis not present

## 2012-01-14 DIAGNOSIS — M6281 Muscle weakness (generalized): Secondary | ICD-10-CM | POA: Diagnosis not present

## 2012-01-14 DIAGNOSIS — F411 Generalized anxiety disorder: Secondary | ICD-10-CM | POA: Diagnosis not present

## 2012-01-14 DIAGNOSIS — N179 Acute kidney failure, unspecified: Secondary | ICD-10-CM | POA: Diagnosis not present

## 2012-01-14 DIAGNOSIS — N390 Urinary tract infection, site not specified: Secondary | ICD-10-CM | POA: Diagnosis not present

## 2012-01-14 DIAGNOSIS — G4700 Insomnia, unspecified: Secondary | ICD-10-CM | POA: Diagnosis not present

## 2012-01-14 DIAGNOSIS — N3000 Acute cystitis without hematuria: Secondary | ICD-10-CM | POA: Diagnosis not present

## 2012-01-14 DIAGNOSIS — C8299 Follicular lymphoma, unspecified, extranodal and solid organ sites: Secondary | ICD-10-CM | POA: Diagnosis not present

## 2012-01-14 DIAGNOSIS — R112 Nausea with vomiting, unspecified: Secondary | ICD-10-CM | POA: Diagnosis not present

## 2012-01-14 DIAGNOSIS — H1045 Other chronic allergic conjunctivitis: Secondary | ICD-10-CM | POA: Diagnosis not present

## 2012-01-14 DIAGNOSIS — E876 Hypokalemia: Secondary | ICD-10-CM | POA: Diagnosis not present

## 2012-01-14 DIAGNOSIS — Z8739 Personal history of other diseases of the musculoskeletal system and connective tissue: Secondary | ICD-10-CM | POA: Diagnosis not present

## 2012-01-14 DIAGNOSIS — C8377 Burkitt lymphoma, spleen: Secondary | ICD-10-CM | POA: Diagnosis not present

## 2012-01-14 DIAGNOSIS — Z933 Colostomy status: Secondary | ICD-10-CM | POA: Diagnosis not present

## 2012-01-14 DIAGNOSIS — T8140XA Infection following a procedure, unspecified, initial encounter: Secondary | ICD-10-CM | POA: Diagnosis not present

## 2012-01-14 DIAGNOSIS — D709 Neutropenia, unspecified: Secondary | ICD-10-CM | POA: Diagnosis not present

## 2012-01-14 DIAGNOSIS — B3781 Candidal esophagitis: Secondary | ICD-10-CM | POA: Diagnosis not present

## 2012-01-14 DIAGNOSIS — J029 Acute pharyngitis, unspecified: Secondary | ICD-10-CM | POA: Diagnosis not present

## 2012-01-14 DIAGNOSIS — F329 Major depressive disorder, single episode, unspecified: Secondary | ICD-10-CM | POA: Diagnosis not present

## 2012-01-14 DIAGNOSIS — R6889 Other general symptoms and signs: Secondary | ICD-10-CM | POA: Diagnosis not present

## 2012-01-14 DIAGNOSIS — S31109A Unspecified open wound of abdominal wall, unspecified quadrant without penetration into peritoneal cavity, initial encounter: Secondary | ICD-10-CM | POA: Diagnosis not present

## 2012-01-14 DIAGNOSIS — J9600 Acute respiratory failure, unspecified whether with hypoxia or hypercapnia: Secondary | ICD-10-CM | POA: Diagnosis not present

## 2012-01-14 DIAGNOSIS — R0602 Shortness of breath: Secondary | ICD-10-CM | POA: Diagnosis not present

## 2012-01-14 DIAGNOSIS — Z79899 Other long term (current) drug therapy: Secondary | ICD-10-CM | POA: Diagnosis not present

## 2012-01-14 DIAGNOSIS — R1314 Dysphagia, pharyngoesophageal phase: Secondary | ICD-10-CM | POA: Diagnosis not present

## 2012-01-14 DIAGNOSIS — E46 Unspecified protein-calorie malnutrition: Secondary | ICD-10-CM | POA: Diagnosis not present

## 2012-01-14 DIAGNOSIS — K5289 Other specified noninfective gastroenteritis and colitis: Secondary | ICD-10-CM | POA: Diagnosis not present

## 2012-01-14 DIAGNOSIS — Z8673 Personal history of transient ischemic attack (TIA), and cerebral infarction without residual deficits: Secondary | ICD-10-CM | POA: Diagnosis not present

## 2012-01-14 DIAGNOSIS — G894 Chronic pain syndrome: Secondary | ICD-10-CM | POA: Diagnosis not present

## 2012-01-14 DIAGNOSIS — I10 Essential (primary) hypertension: Secondary | ICD-10-CM | POA: Diagnosis not present

## 2012-01-14 DIAGNOSIS — R5381 Other malaise: Secondary | ICD-10-CM | POA: Diagnosis not present

## 2012-01-14 DIAGNOSIS — H01009 Unspecified blepharitis unspecified eye, unspecified eyelid: Secondary | ICD-10-CM | POA: Diagnosis not present

## 2012-01-14 DIAGNOSIS — T18108A Unspecified foreign body in esophagus causing other injury, initial encounter: Secondary | ICD-10-CM | POA: Diagnosis not present

## 2012-01-14 DIAGNOSIS — R1084 Generalized abdominal pain: Secondary | ICD-10-CM | POA: Diagnosis not present

## 2012-01-14 DIAGNOSIS — H109 Unspecified conjunctivitis: Secondary | ICD-10-CM | POA: Diagnosis not present

## 2012-01-14 DIAGNOSIS — Z8544 Personal history of malignant neoplasm of other female genital organs: Secondary | ICD-10-CM | POA: Diagnosis not present

## 2012-01-14 DIAGNOSIS — E440 Moderate protein-calorie malnutrition: Secondary | ICD-10-CM | POA: Diagnosis not present

## 2012-01-14 DIAGNOSIS — Z7902 Long term (current) use of antithrombotics/antiplatelets: Secondary | ICD-10-CM | POA: Diagnosis not present

## 2012-01-14 DIAGNOSIS — I4891 Unspecified atrial fibrillation: Secondary | ICD-10-CM | POA: Diagnosis not present

## 2012-01-14 DIAGNOSIS — G8929 Other chronic pain: Secondary | ICD-10-CM | POA: Diagnosis not present

## 2012-01-14 DIAGNOSIS — I509 Heart failure, unspecified: Secondary | ICD-10-CM | POA: Diagnosis not present

## 2012-01-14 DIAGNOSIS — K117 Disturbances of salivary secretion: Secondary | ICD-10-CM | POA: Diagnosis not present

## 2012-01-14 DIAGNOSIS — R109 Unspecified abdominal pain: Secondary | ICD-10-CM | POA: Diagnosis not present

## 2012-01-14 DIAGNOSIS — R300 Dysuria: Secondary | ICD-10-CM | POA: Diagnosis not present

## 2012-01-14 DIAGNOSIS — C8589 Other specified types of non-Hodgkin lymphoma, extranodal and solid organ sites: Secondary | ICD-10-CM | POA: Diagnosis not present

## 2012-01-14 DIAGNOSIS — Z8719 Personal history of other diseases of the digestive system: Secondary | ICD-10-CM | POA: Diagnosis not present

## 2012-01-14 DIAGNOSIS — G8928 Other chronic postprocedural pain: Secondary | ICD-10-CM | POA: Diagnosis not present

## 2012-01-14 DIAGNOSIS — Z862 Personal history of diseases of the blood and blood-forming organs and certain disorders involving the immune mechanism: Secondary | ICD-10-CM | POA: Diagnosis not present

## 2012-01-14 LAB — URINE MICROSCOPIC-ADD ON

## 2012-01-14 LAB — BASIC METABOLIC PANEL
CO2: 23 mEq/L (ref 19–32)
Calcium: 9.4 mg/dL (ref 8.4–10.5)
GFR calc non Af Amer: 81 mL/min — ABNORMAL LOW (ref >90)
Potassium: 3.6 mEq/L (ref 3.5–5.1)
Sodium: 128 mEq/L — ABNORMAL LOW (ref 135–145)

## 2012-01-14 LAB — URINALYSIS, ROUTINE W REFLEX MICROSCOPIC
Leukocytes, UA: NEGATIVE
Nitrite: NEGATIVE
Protein, ur: NEGATIVE mg/dL
Specific Gravity, Urine: 1.013 (ref 1.005–1.030)
Urobilinogen, UA: 0.2 mg/dL (ref 0.0–1.0)

## 2012-01-15 ENCOUNTER — Other Ambulatory Visit: Payer: Self-pay | Admitting: Neurology

## 2012-01-15 DIAGNOSIS — I4891 Unspecified atrial fibrillation: Secondary | ICD-10-CM | POA: Diagnosis not present

## 2012-01-15 DIAGNOSIS — Z933 Colostomy status: Secondary | ICD-10-CM | POA: Diagnosis not present

## 2012-01-15 DIAGNOSIS — B3781 Candidal esophagitis: Secondary | ICD-10-CM | POA: Diagnosis not present

## 2012-01-15 DIAGNOSIS — K5732 Diverticulitis of large intestine without perforation or abscess without bleeding: Secondary | ICD-10-CM | POA: Diagnosis not present

## 2012-01-15 LAB — URINE CULTURE

## 2012-01-16 DIAGNOSIS — H109 Unspecified conjunctivitis: Secondary | ICD-10-CM | POA: Diagnosis not present

## 2012-01-16 DIAGNOSIS — I4891 Unspecified atrial fibrillation: Secondary | ICD-10-CM | POA: Diagnosis not present

## 2012-01-16 DIAGNOSIS — E876 Hypokalemia: Secondary | ICD-10-CM | POA: Diagnosis not present

## 2012-01-16 DIAGNOSIS — K5732 Diverticulitis of large intestine without perforation or abscess without bleeding: Secondary | ICD-10-CM | POA: Diagnosis not present

## 2012-01-18 DIAGNOSIS — J9600 Acute respiratory failure, unspecified whether with hypoxia or hypercapnia: Secondary | ICD-10-CM | POA: Diagnosis not present

## 2012-01-18 DIAGNOSIS — F329 Major depressive disorder, single episode, unspecified: Secondary | ICD-10-CM | POA: Diagnosis not present

## 2012-01-18 DIAGNOSIS — N179 Acute kidney failure, unspecified: Secondary | ICD-10-CM | POA: Diagnosis not present

## 2012-01-18 DIAGNOSIS — E46 Unspecified protein-calorie malnutrition: Secondary | ICD-10-CM | POA: Diagnosis not present

## 2012-01-19 DIAGNOSIS — E876 Hypokalemia: Secondary | ICD-10-CM | POA: Diagnosis not present

## 2012-01-19 DIAGNOSIS — H109 Unspecified conjunctivitis: Secondary | ICD-10-CM | POA: Diagnosis not present

## 2012-01-19 DIAGNOSIS — B3781 Candidal esophagitis: Secondary | ICD-10-CM | POA: Diagnosis not present

## 2012-01-20 DIAGNOSIS — K5732 Diverticulitis of large intestine without perforation or abscess without bleeding: Secondary | ICD-10-CM | POA: Diagnosis not present

## 2012-01-20 DIAGNOSIS — K117 Disturbances of salivary secretion: Secondary | ICD-10-CM | POA: Diagnosis not present

## 2012-01-23 DIAGNOSIS — H01009 Unspecified blepharitis unspecified eye, unspecified eyelid: Secondary | ICD-10-CM | POA: Diagnosis not present

## 2012-01-25 ENCOUNTER — Emergency Department (HOSPITAL_COMMUNITY): Payer: Medicare Other

## 2012-01-25 ENCOUNTER — Emergency Department (HOSPITAL_COMMUNITY)
Admission: EM | Admit: 2012-01-25 | Discharge: 2012-01-25 | Disposition: A | Discharge status: Home / Self Care | Payer: Medicare Other | Attending: Emergency Medicine | Admitting: Emergency Medicine

## 2012-01-25 ENCOUNTER — Encounter (HOSPITAL_COMMUNITY): Payer: Self-pay

## 2012-01-25 DIAGNOSIS — T18108A Unspecified foreign body in esophagus causing other injury, initial encounter: Secondary | ICD-10-CM | POA: Insufficient documentation

## 2012-01-25 DIAGNOSIS — R1310 Dysphagia, unspecified: Secondary | ICD-10-CM | POA: Diagnosis not present

## 2012-01-25 DIAGNOSIS — J029 Acute pharyngitis, unspecified: Secondary | ICD-10-CM | POA: Diagnosis not present

## 2012-01-25 DIAGNOSIS — K219 Gastro-esophageal reflux disease without esophagitis: Secondary | ICD-10-CM | POA: Insufficient documentation

## 2012-01-25 DIAGNOSIS — Z8544 Personal history of malignant neoplasm of other female genital organs: Secondary | ICD-10-CM | POA: Insufficient documentation

## 2012-01-25 DIAGNOSIS — Y9389 Activity, other specified: Secondary | ICD-10-CM | POA: Insufficient documentation

## 2012-01-25 DIAGNOSIS — Y929 Unspecified place or not applicable: Secondary | ICD-10-CM | POA: Insufficient documentation

## 2012-01-25 DIAGNOSIS — Z8639 Personal history of other endocrine, nutritional and metabolic disease: Secondary | ICD-10-CM | POA: Insufficient documentation

## 2012-01-25 DIAGNOSIS — Z8673 Personal history of transient ischemic attack (TIA), and cerebral infarction without residual deficits: Secondary | ICD-10-CM | POA: Insufficient documentation

## 2012-01-25 DIAGNOSIS — I10 Essential (primary) hypertension: Secondary | ICD-10-CM | POA: Insufficient documentation

## 2012-01-25 DIAGNOSIS — Z79899 Other long term (current) drug therapy: Secondary | ICD-10-CM | POA: Insufficient documentation

## 2012-01-25 DIAGNOSIS — K222 Esophageal obstruction: Secondary | ICD-10-CM | POA: Diagnosis not present

## 2012-01-25 DIAGNOSIS — Z862 Personal history of diseases of the blood and blood-forming organs and certain disorders involving the immune mechanism: Secondary | ICD-10-CM | POA: Insufficient documentation

## 2012-01-25 DIAGNOSIS — J9600 Acute respiratory failure, unspecified whether with hypoxia or hypercapnia: Secondary | ICD-10-CM | POA: Diagnosis not present

## 2012-01-25 DIAGNOSIS — Z8719 Personal history of other diseases of the digestive system: Secondary | ICD-10-CM | POA: Insufficient documentation

## 2012-01-25 DIAGNOSIS — IMO0002 Reserved for concepts with insufficient information to code with codable children: Secondary | ICD-10-CM | POA: Insufficient documentation

## 2012-01-25 DIAGNOSIS — M129 Arthropathy, unspecified: Secondary | ICD-10-CM | POA: Insufficient documentation

## 2012-01-25 DIAGNOSIS — T18128A Food in esophagus causing other injury, initial encounter: Secondary | ICD-10-CM

## 2012-01-25 MED ORDER — GLUCAGON HCL (RDNA) 1 MG IJ SOLR
1.0000 mg | Freq: Once | INTRAMUSCULAR | Status: AC
  Administered 2012-01-25: 1 mg via INTRAVENOUS
  Filled 2012-01-25: qty 1

## 2012-01-25 NOTE — ED Notes (Signed)
PATIENT IS NOW REPORTING SOMETHING IN ESOPHAGUS.  NO DIFFICULTY BREATHING.  HUSBAND AT BEDSIDE.

## 2012-01-25 NOTE — ED Notes (Signed)
Patient eating eggs this morning and felt them stick in her throat, has an esophageal stricture, primary care dr wanted her to come to ed to be evaluated.  Pt feels as if this has resolved.

## 2012-01-25 NOTE — ED Notes (Signed)
NO TRACH OR FOLEY CATHETER PRESENT AT THIS ADMISSION

## 2012-01-25 NOTE — ED Notes (Signed)
From [facility]

## 2012-01-25 NOTE — ED Provider Notes (Signed)
History     CSN: [id number]  Arrival date & time 01/25/12  1101   First MD Initiated Contact with Patient 01/25/12 1104      Chief Complaint  Patient presents with  . Sore Throat    (Consider location/radiation/quality/duration/timing/severity/associated sxs/prior treatment) HPI Comments: Patient with a history of Esophageal Stricture presents today with a chief complaint of food impaction.  She states that she feels like a piece of egg that she was eating this morning became stuck.  She reports that initially felt stuck in the proximal portion of the esophagus, but now feels that the food has moved more distally.  When asked where she feels the food she points to the lower portion of the neck.  She denies any difficulty breathing.  She has been able to drink liquids since the food became stuck, but has not attempted to eat food.  She reports that she has had issues with fecal impaction in the past.  Her GI physician is Dr. Matthias Hughs.    The history is provided by the patient.    Past Medical History  Diagnosis Date  . Cancer 07/08/11    follicular B cell lymphoma  . TIA (transient ischemic attack)   . Arthritis   . Hypertension   . Esophageal abnormality     strictures  . Arthritis 09/04/2011  . Dehydration 11/20/2011  . Diarrhea 11/20/2011  . Complication of anesthesia   . PONV (postoperative nausea and vomiting)     in the 1980's  . GERD (gastroesophageal reflux disease)   . Esophageal stricture     x 2    Past Surgical History  Procedure Date  . Appendectomy   . Parotidectomy w/ neck dissection total   . Tonsillectomy   . Abdominal hysterectomy     left ovaries  . Laparotomy 11/22/2011    Procedure: EXPLORATORY LAPAROTOMY;  Surgeon: Velora Heckler, MD;  Location: [facility];  Service: General;  Laterality: N/A;  drainage of pelvic abscess  . Colostomy revision 11/22/2011    Procedure: COLON RESECTION SIGMOID;  Surgeon: Velora Heckler, MD;  Location: [facility];  Service: General;   Laterality: N/A;  . Colostomy 11/22/2011    Procedure: COLOSTOMY;  Surgeon: Velora Heckler, MD;  Location: [facility];  Service: General;  Laterality: N/A;  desending colostomy  . Esophagogastroduodenoscopy 01/06/2012    Procedure: ESOPHAGOGASTRODUODENOSCOPY (EGD);  Surgeon: Rachael Fee, MD;  Location: [facility];  Service: Endoscopy;  Laterality: N/A;    Family History  Problem Relation Age of Onset  . Cancer Mother 62    gastric  . Hypertension Father     heart disease  . Cancer Maternal Aunt 81    breast cancer  . Cancer Maternal Uncle 58    colon    History  Substance Use Topics  . Smoking status: Never Smoker   . Smokeless tobacco: Never Used  . Alcohol Use: No    OB History    Grav Para Term Preterm Abortions TAB SAB Ect Mult Living                  Review of Systems  Constitutional: Negative for fever and chills.  HENT: Positive for trouble swallowing. Negative for drooling.   Respiratory: Negative for choking and chest tightness.   Cardiovascular: Negative for chest pain.  Gastrointestinal: Negative for nausea and vomiting.  All other systems reviewed and are negative.    Allergies  Codeine; Other; Penicillins; and Sulfa antibiotics  Home Medications  Current Outpatient Rx  Name  Route  Sig  Dispense  Refill  . ATENOLOL 25 MG PO TABS   Oral   Take 50 mg by mouth 2 (two) times daily.         [name] CLOPIDOGREL BISULFATE 75 MG PO TABS   Oral   Take 75 mg by mouth daily.         . FENTANYL 12 MCG/HR TD PT72   Transdermal   Place 1 patch onto the skin every 3 (three) days.         Di Kindle SULFATE 325 (65 FE) MG PO TABS   Oral   Take 325 mg by mouth daily with breakfast.         . LIDOCAINE 5 % EX PTCH   Transdermal   Place 1 patch onto the skin daily. Remove & Discard patch within 12 hours or as directed by MD         . LISINOPRIL 2.5 MG PO TABS   Oral   Take 2.5 mg by mouth daily.         [name] LORAZEPAM 0.5 MG PO TABS   Oral    Take 0.5 mg by mouth at bedtime as needed. For anxiety         . METOCLOPRAMIDE HCL 5 MG PO TABS   Oral   Take 5 mg by mouth 3 (three) times daily as needed. For nausea         . OXYCODONE HCL 5 MG PO CAPS   Oral   Take 5 mg by mouth every 4 (four) hours as needed. For pain           BP 142/72  Pulse 107  Temp 98 F (36.7 C) (Oral)  SpO2 99%  Physical Exam  Nursing note and vitals reviewed. Constitutional: She appears well-developed and well-nourished. No distress.  HENT:  Head: Normocephalic and atraumatic.  Mouth/Throat: Uvula is midline, oropharynx is clear and moist and mucous membranes are normal.       Airway patent Patient handling secretions well  Neck: Normal range of motion. Neck supple.  Cardiovascular: Normal rate, regular rhythm and normal heart sounds.   Pulmonary/Chest: Effort normal and breath sounds normal. No respiratory distress. She has no decreased breath sounds. She has no wheezes. She has no rales.       No stridor Patient speaking in full sentences No evidence of respiratory distress  Neurological: She is alert.  Skin: Skin is warm and dry. She is not diaphoretic.  Psychiatric: She has a normal mood and affect.    ED Course  Procedures (including critical care time)  Labs Reviewed - No data to display Dg Neck Soft Tissue  01/25/2012  *RADIOLOGY REPORT*  Clinical Data: Sore throat.  Difficulty swallowing.  NECK SOFT TISSUES - 1+ VIEW  Comparison: None  Findings: The epiglottis appears normal.  The aryepiglottic folds are smooth.  No air in the vallecular airspace.  No abnormal prevertebral soft tissue swelling or worrisome air collections. The cervical spine demonstrates degenerative changes.  No subglottic tracheal narrowing is appreciated.  IMPRESSION: Unremarkable lateral soft tissue view of the neck.   Original Report Authenticated By: Rudie Meyer, M.D.    Dg Chest 2 View  01/25/2012  *RADIOLOGY REPORT*  Clinical Data: Food impaction.   History of esophageal stricture.  CHEST - 2 VIEW  Comparison: 01/12/2012.  Findings: The power port is stable.  The PICC line is stable.  The cardiac silhouette, mediastinal and  hilar contours are normal and stable.  The lungs are clear.  No pleural effusion.  No pneumothorax.  The bony thorax is intact.  IMPRESSION: No acute cardiopulmonary findings. Stable support apparatus.   Original Report Authenticated By: Rudie Meyer, M.D.      No diagnosis found.  Discussed with physician on call for [facility].  He feels that since the patient is able to swallow and her xrays are negative, the patient can be discharged home and follow up in the office tomorrow.    MDM  Patient presenting with food impaction.  Patient not drooling.  She is able to swallow liquids without difficulty.  Witnessed the patient drinking water without difficulty.  Airway patent.  No respiratory distress.  Patient given Glucagon and felt that symptoms improved.  I have discussed with GI physician on call.  He feels that the patient can follow up in the office tomorrow.  Feel that this is appropriate because the patient is able to drink without difficulty and no compromise of the airway.  Soft tissue neck xray and CXR negative.        Pascal Lux Chilcoot-V[place], PA-C 01/25/12 2044

## 2012-01-26 DIAGNOSIS — H01009 Unspecified blepharitis unspecified eye, unspecified eyelid: Secondary | ICD-10-CM | POA: Diagnosis not present

## 2012-01-26 NOTE — ED Provider Notes (Signed)
Medical screening examination/treatment/procedure(s) were conducted as a shared visit with non-physician practitioner(s) and myself.  I personally evaluated the patient during the encounter  [name] is a 78 y.o. female hx of esophageal stricture here with possible food impaction. She felt a piece of egg was stuck in her throat this AM. Denies eating anything hard or bones. No vomiting. She was comfortable in the ED. She was given glucagon and felt better. CXR, neck xray showed no visible foreign bodies. The PA discussed with Dr. Buccini's partner (GI), who mentioned that since she is able to tolerate PO, they will see her in the office tomorrow. Patient agreeable with the plan.    Richardean Canal, MD 01/26/12 0700

## 2012-01-29 ENCOUNTER — Other Ambulatory Visit: Payer: No Typology Code available for payment source | Admitting: Lab

## 2012-01-29 ENCOUNTER — Ambulatory Visit: Payer: No Typology Code available for payment source | Admitting: Oncology

## 2012-01-29 DIAGNOSIS — N3000 Acute cystitis without hematuria: Secondary | ICD-10-CM | POA: Diagnosis not present

## 2012-02-03 DIAGNOSIS — H1045 Other chronic allergic conjunctivitis: Secondary | ICD-10-CM | POA: Diagnosis not present

## 2012-02-03 DIAGNOSIS — I509 Heart failure, unspecified: Secondary | ICD-10-CM | POA: Diagnosis not present

## 2012-02-03 DIAGNOSIS — R0602 Shortness of breath: Secondary | ICD-10-CM | POA: Diagnosis not present

## 2012-02-04 DIAGNOSIS — R1310 Dysphagia, unspecified: Secondary | ICD-10-CM | POA: Diagnosis not present

## 2012-02-04 DIAGNOSIS — R0602 Shortness of breath: Secondary | ICD-10-CM | POA: Diagnosis not present

## 2012-02-04 DIAGNOSIS — B3781 Candidal esophagitis: Secondary | ICD-10-CM | POA: Diagnosis not present

## 2012-02-05 DIAGNOSIS — N390 Urinary tract infection, site not specified: Secondary | ICD-10-CM | POA: Diagnosis not present

## 2012-02-06 DIAGNOSIS — H1045 Other chronic allergic conjunctivitis: Secondary | ICD-10-CM | POA: Diagnosis not present

## 2012-02-06 DIAGNOSIS — R0602 Shortness of breath: Secondary | ICD-10-CM | POA: Diagnosis not present

## 2012-02-08 DIAGNOSIS — R6889 Other general symptoms and signs: Secondary | ICD-10-CM | POA: Diagnosis not present

## 2012-02-12 DIAGNOSIS — A499 Bacterial infection, unspecified: Secondary | ICD-10-CM | POA: Diagnosis not present

## 2012-02-12 DIAGNOSIS — R300 Dysuria: Secondary | ICD-10-CM | POA: Diagnosis not present

## 2012-02-12 DIAGNOSIS — N390 Urinary tract infection, site not specified: Secondary | ICD-10-CM | POA: Diagnosis not present

## 2012-02-13 DIAGNOSIS — R300 Dysuria: Secondary | ICD-10-CM | POA: Diagnosis not present

## 2012-02-13 DIAGNOSIS — R5381 Other malaise: Secondary | ICD-10-CM | POA: Diagnosis not present

## 2012-02-13 DIAGNOSIS — N390 Urinary tract infection, site not specified: Secondary | ICD-10-CM | POA: Diagnosis not present

## 2012-02-17 DIAGNOSIS — N390 Urinary tract infection, site not specified: Secondary | ICD-10-CM | POA: Diagnosis not present

## 2012-02-17 DIAGNOSIS — R300 Dysuria: Secondary | ICD-10-CM | POA: Diagnosis not present

## 2012-02-17 DIAGNOSIS — H1045 Other chronic allergic conjunctivitis: Secondary | ICD-10-CM | POA: Diagnosis not present

## 2012-02-20 ENCOUNTER — Other Ambulatory Visit: Payer: No Typology Code available for payment source | Admitting: Lab

## 2012-02-20 ENCOUNTER — Ambulatory Visit: Payer: No Typology Code available for payment source | Admitting: Family

## 2012-02-22 DIAGNOSIS — G894 Chronic pain syndrome: Secondary | ICD-10-CM | POA: Diagnosis not present

## 2012-02-22 DIAGNOSIS — M6281 Muscle weakness (generalized): Secondary | ICD-10-CM | POA: Diagnosis not present

## 2012-02-22 DIAGNOSIS — R300 Dysuria: Secondary | ICD-10-CM | POA: Diagnosis not present

## 2012-02-22 DIAGNOSIS — I4891 Unspecified atrial fibrillation: Secondary | ICD-10-CM | POA: Diagnosis not present

## 2012-02-22 DIAGNOSIS — K222 Esophageal obstruction: Secondary | ICD-10-CM | POA: Diagnosis not present

## 2012-02-22 DIAGNOSIS — T8140XA Infection following a procedure, unspecified, initial encounter: Secondary | ICD-10-CM | POA: Diagnosis not present

## 2012-02-22 DIAGNOSIS — B3781 Candidal esophagitis: Secondary | ICD-10-CM | POA: Diagnosis not present

## 2012-02-22 DIAGNOSIS — R109 Unspecified abdominal pain: Secondary | ICD-10-CM | POA: Diagnosis not present

## 2012-02-22 DIAGNOSIS — F329 Major depressive disorder, single episode, unspecified: Secondary | ICD-10-CM | POA: Diagnosis not present

## 2012-02-22 DIAGNOSIS — D709 Neutropenia, unspecified: Secondary | ICD-10-CM | POA: Diagnosis not present

## 2012-02-22 DIAGNOSIS — I10 Essential (primary) hypertension: Secondary | ICD-10-CM | POA: Diagnosis not present

## 2012-02-22 DIAGNOSIS — N390 Urinary tract infection, site not specified: Secondary | ICD-10-CM | POA: Diagnosis not present

## 2012-02-22 DIAGNOSIS — E46 Unspecified protein-calorie malnutrition: Secondary | ICD-10-CM | POA: Diagnosis not present

## 2012-02-22 DIAGNOSIS — G4700 Insomnia, unspecified: Secondary | ICD-10-CM | POA: Diagnosis not present

## 2012-02-22 DIAGNOSIS — E876 Hypokalemia: Secondary | ICD-10-CM | POA: Diagnosis not present

## 2012-02-22 DIAGNOSIS — J9600 Acute respiratory failure, unspecified whether with hypoxia or hypercapnia: Secondary | ICD-10-CM | POA: Diagnosis not present

## 2012-02-22 DIAGNOSIS — H1045 Other chronic allergic conjunctivitis: Secondary | ICD-10-CM | POA: Diagnosis not present

## 2012-02-22 DIAGNOSIS — K5289 Other specified noninfective gastroenteritis and colitis: Secondary | ICD-10-CM | POA: Diagnosis not present

## 2012-02-22 DIAGNOSIS — C8589 Other specified types of non-Hodgkin lymphoma, extranodal and solid organ sites: Secondary | ICD-10-CM | POA: Diagnosis not present

## 2012-02-22 DIAGNOSIS — H01009 Unspecified blepharitis unspecified eye, unspecified eyelid: Secondary | ICD-10-CM | POA: Diagnosis not present

## 2012-02-22 DIAGNOSIS — I509 Heart failure, unspecified: Secondary | ICD-10-CM | POA: Diagnosis not present

## 2012-02-22 DIAGNOSIS — E871 Hypo-osmolality and hyponatremia: Secondary | ICD-10-CM | POA: Diagnosis not present

## 2012-02-22 DIAGNOSIS — R1084 Generalized abdominal pain: Secondary | ICD-10-CM | POA: Diagnosis not present

## 2012-02-22 DIAGNOSIS — F411 Generalized anxiety disorder: Secondary | ICD-10-CM | POA: Diagnosis not present

## 2012-02-22 DIAGNOSIS — R1314 Dysphagia, pharyngoesophageal phase: Secondary | ICD-10-CM | POA: Diagnosis not present

## 2012-02-24 DIAGNOSIS — H01009 Unspecified blepharitis unspecified eye, unspecified eyelid: Secondary | ICD-10-CM | POA: Diagnosis not present

## 2012-02-26 DIAGNOSIS — J9600 Acute respiratory failure, unspecified whether with hypoxia or hypercapnia: Secondary | ICD-10-CM | POA: Diagnosis not present

## 2012-02-26 DIAGNOSIS — I10 Essential (primary) hypertension: Secondary | ICD-10-CM | POA: Diagnosis not present

## 2012-02-26 DIAGNOSIS — B3781 Candidal esophagitis: Secondary | ICD-10-CM | POA: Diagnosis not present

## 2012-02-26 DIAGNOSIS — E46 Unspecified protein-calorie malnutrition: Secondary | ICD-10-CM | POA: Diagnosis not present

## 2012-03-01 ENCOUNTER — Telehealth (INDEPENDENT_AMBULATORY_CARE_PROVIDER_SITE_OTHER): Payer: Self-pay

## 2012-03-01 NOTE — Telephone Encounter (Signed)
The patient called after surgery 8/31 for perforated diverticulitis.  She was in icu and extensive rehab afterwards.  She is now in [facility].  She has not had a follow up with Dr Gerrit Friends.  She is reporting a lower abdominal pain x 3 days that comes and goes.  She has no fever.  She has an ostomy but doesn't know if it's working right or not.  I asked if there was a nurse checking in on her and managing the ostomy.  She doesn't think they know much about what she had done.  She has a small appetite but eats ok without vomiting.  She reports she was found to have yeast infection in her esophagus.  I told her she may just need to come in for a post op follow up with Dr Gerrit Friends but I will send a note to him and his nurse.  You can reach her on her cell [phone] or her husband [phone].

## 2012-03-02 NOTE — Telephone Encounter (Signed)
Huntley Dec: I have not seen this patient since August when I operated on her emergently on the LDOW service at [facility].  If she is having problems, she should see her primary MD or her physician at the nursing home.  I have never seen her in the office and have not received any follow up materials from any of her physicians.  tmg  Velora Heckler, MD, [facility] Office: [phone]

## 2012-03-02 NOTE — Telephone Encounter (Signed)
I called the pt and let her know to see her medical md or the physician at [facility] if there is one that looks over her.  Pt agrees and is appreciative.

## 2012-03-04 ENCOUNTER — Emergency Department (HOSPITAL_COMMUNITY): Payer: Medicare Other

## 2012-03-04 ENCOUNTER — Emergency Department (HOSPITAL_COMMUNITY)
Admission: EM | Admit: 2012-03-04 | Discharge: 2012-03-04 | Disposition: A | Discharge status: Home / Self Care | Payer: Medicare Other | Attending: Emergency Medicine | Admitting: Emergency Medicine

## 2012-03-04 ENCOUNTER — Encounter (HOSPITAL_COMMUNITY): Payer: Self-pay | Admitting: Emergency Medicine

## 2012-03-04 DIAGNOSIS — C8299 Follicular lymphoma, unspecified, extranodal and solid organ sites: Secondary | ICD-10-CM | POA: Insufficient documentation

## 2012-03-04 DIAGNOSIS — R1084 Generalized abdominal pain: Secondary | ICD-10-CM | POA: Insufficient documentation

## 2012-03-04 DIAGNOSIS — E876 Hypokalemia: Secondary | ICD-10-CM | POA: Diagnosis not present

## 2012-03-04 DIAGNOSIS — C8589 Other specified types of non-Hodgkin lymphoma, extranodal and solid organ sites: Secondary | ICD-10-CM | POA: Diagnosis not present

## 2012-03-04 DIAGNOSIS — Z933 Colostomy status: Secondary | ICD-10-CM | POA: Insufficient documentation

## 2012-03-04 DIAGNOSIS — R300 Dysuria: Secondary | ICD-10-CM | POA: Insufficient documentation

## 2012-03-04 DIAGNOSIS — Z8719 Personal history of other diseases of the digestive system: Secondary | ICD-10-CM | POA: Insufficient documentation

## 2012-03-04 DIAGNOSIS — Z7902 Long term (current) use of antithrombotics/antiplatelets: Secondary | ICD-10-CM | POA: Insufficient documentation

## 2012-03-04 DIAGNOSIS — Z79899 Other long term (current) drug therapy: Secondary | ICD-10-CM | POA: Insufficient documentation

## 2012-03-04 DIAGNOSIS — R109 Unspecified abdominal pain: Secondary | ICD-10-CM

## 2012-03-04 DIAGNOSIS — K5289 Other specified noninfective gastroenteritis and colitis: Secondary | ICD-10-CM | POA: Insufficient documentation

## 2012-03-04 DIAGNOSIS — I10 Essential (primary) hypertension: Secondary | ICD-10-CM | POA: Insufficient documentation

## 2012-03-04 DIAGNOSIS — Z8739 Personal history of other diseases of the musculoskeletal system and connective tissue: Secondary | ICD-10-CM | POA: Insufficient documentation

## 2012-03-04 DIAGNOSIS — Z8673 Personal history of transient ischemic attack (TIA), and cerebral infarction without residual deficits: Secondary | ICD-10-CM | POA: Insufficient documentation

## 2012-03-04 DIAGNOSIS — K529 Noninfective gastroenteritis and colitis, unspecified: Secondary | ICD-10-CM

## 2012-03-04 DIAGNOSIS — R112 Nausea with vomiting, unspecified: Secondary | ICD-10-CM | POA: Insufficient documentation

## 2012-03-04 LAB — URINE MICROSCOPIC-ADD ON

## 2012-03-04 LAB — CBC WITH DIFFERENTIAL/PLATELET
Basophils Absolute: 0 10*3/uL (ref 0.0–0.1)
Eosinophils Absolute: 0.2 10*3/uL (ref 0.0–0.7)
Eosinophils Relative: 2 % (ref 0–5)
MCH: 28.6 pg (ref 26.0–34.0)
MCV: 82.1 fL (ref 78.0–100.0)
Platelets: 403 10*3/uL — ABNORMAL HIGH (ref 150–400)
RDW: 19.7 % — ABNORMAL HIGH (ref 11.5–15.5)

## 2012-03-04 LAB — URINALYSIS, ROUTINE W REFLEX MICROSCOPIC
Glucose, UA: NEGATIVE mg/dL
Nitrite: NEGATIVE
Protein, ur: NEGATIVE mg/dL

## 2012-03-04 LAB — COMPREHENSIVE METABOLIC PANEL
ALT: 10 U/L (ref 0–35)
AST: 22 U/L (ref 0–37)
Calcium: 8.7 mg/dL (ref 8.4–10.5)
GFR calc Af Amer: >90 mL/min (ref >90)
Glucose, Bld: 96 mg/dL (ref 70–99)
Sodium: 138 mEq/L (ref 135–145)
Total Protein: 4.3 g/dL — ABNORMAL LOW (ref 6.0–8.3)

## 2012-03-04 MED ORDER — METRONIDAZOLE 500 MG PO TABS: 500.0000 mg | ORAL_TABLET | Freq: Two times a day (BID) | ORAL | Status: DC

## 2012-03-04 MED ORDER — CIPROFLOXACIN IN D5W 400 MG/200ML IV SOLN
400.0000 mg | Freq: Once | INTRAVENOUS | Status: AC
  Administered 2012-03-04: 400 mg via INTRAVENOUS
  Filled 2012-03-04: qty 200

## 2012-03-04 MED ORDER — POTASSIUM CHLORIDE CRYS ER 20 MEQ PO TBCR
20.0000 meq | EXTENDED_RELEASE_TABLET | Freq: Once | ORAL | Status: AC
  Administered 2012-03-04: 20 meq via ORAL
  Filled 2012-03-04: qty 1

## 2012-03-04 MED ORDER — METRONIDAZOLE 500 MG PO TABS
500.0000 mg | ORAL_TABLET | Freq: Once | ORAL | Status: AC
Start: 2012-03-04 — End: 2012-03-04
  Administered 2012-03-04: 500 mg via ORAL
  Filled 2012-03-04: qty 1

## 2012-03-04 MED ORDER — TRAMADOL HCL 50 MG PO TABS: 50.0000 mg | ORAL_TABLET | Freq: Four times a day (QID) | ORAL | Status: DC | PRN

## 2012-03-04 MED ORDER — POTASSIUM CHLORIDE ER 10 MEQ PO TBCR: 10.0000 meq | EXTENDED_RELEASE_TABLET | Freq: Two times a day (BID) | ORAL | Status: DC

## 2012-03-04 MED ORDER — POTASSIUM CHLORIDE 20 MEQ/15ML (10%) PO LIQD
40.0000 meq | Freq: Once | ORAL | Status: AC
  Administered 2012-03-04: 20 meq via ORAL
  Filled 2012-03-04: qty 30

## 2012-03-04 MED ORDER — CIPROFLOXACIN HCL 500 MG PO TABS: 500.0000 mg | ORAL_TABLET | Freq: Two times a day (BID) | ORAL | Status: DC

## 2012-03-04 MED ORDER — SODIUM CHLORIDE 0.9 % IV SOLN
Freq: Once | INTRAVENOUS | Status: AC
  Administered 2012-03-04: 04:00:00 via INTRAVENOUS

## 2012-03-04 MED ORDER — IOHEXOL 300 MG/ML  SOLN
100.0000 mL | Freq: Once | INTRAMUSCULAR | Status: AC | PRN
  Administered 2012-03-04: 100 mL via INTRAVENOUS

## 2012-03-04 MED ORDER — ONDANSETRON HCL 4 MG/2ML IJ SOLN
4.0000 mg | Freq: Once | INTRAMUSCULAR | Status: AC
  Administered 2012-03-04: 4 mg via INTRAVENOUS
  Filled 2012-03-04: qty 2

## 2012-03-04 NOTE — ED Provider Notes (Signed)
History     CSN: [id number]  Arrival date & time 03/04/12  0251   First MD Initiated Contact with Patient 03/04/12 [phone]      Chief Complaint  Patient presents with  . Abdominal Pain  . Nausea    (Consider location/radiation/quality/duration/timing/severity/associated sxs/prior treatment) HPI  78 year old female with history of peripheral T-cell lymphoma, esophageal abnormality, and prior history of perforated diverticulitis presents complaining of abdominal pain. Patient was brought via EMS from [facility].  Patient reports having abdominal pain with nausea and vomiting for the past 4 days.  Onset was gradual, pain is described as a sharp sensation, usually lasting about 30-45 minutes and resolved without any intervention. She felt nauseated and has gagged a few times during the day sometimes with nonbilious nonbloody vomitus. No diarrhea. She has no appetite. She has a history of cystitis and has been complaining of burning urination for 2 weeks. She denies fever, chills, chest pain, shortness of breath, back pain, diarrhea or constipation. Patient has a ostomy bag and sts it has function appropriately.  She is here today because when she requesting for pain medication, her nurse felt she may need further evaluation in ER due to her pain x 4 days.  Pt currently denies any abd pain.    Past Medical History  Diagnosis Date  . Cancer 07/08/11    follicular B cell lymphoma  . TIA (transient ischemic attack)   . Arthritis   . Hypertension   . Esophageal abnormality     strictures  . Arthritis 09/04/2011  . Dehydration 11/20/2011  . Diarrhea 11/20/2011  . Complication of anesthesia   . PONV (postoperative nausea and vomiting)     in the 1980's  . GERD (gastroesophageal reflux disease)   . Esophageal stricture     x 2    Past Surgical History  Procedure Date  . Appendectomy   . Parotidectomy w/ neck dissection total   . Tonsillectomy   . Abdominal  hysterectomy     left ovaries  . Laparotomy 11/22/2011    Procedure: EXPLORATORY LAPAROTOMY;  Surgeon: Velora Heckler, MD;  Location: [facility];  Service: General;  Laterality: N/A;  drainage of pelvic abscess  . Colostomy revision 11/22/2011    Procedure: COLON RESECTION SIGMOID;  Surgeon: Velora Heckler, MD;  Location: [facility];  Service: General;  Laterality: N/A;  . Colostomy 11/22/2011    Procedure: COLOSTOMY;  Surgeon: Velora Heckler, MD;  Location: [facility];  Service: General;  Laterality: N/A;  desending colostomy  . Esophagogastroduodenoscopy 01/06/2012    Procedure: ESOPHAGOGASTRODUODENOSCOPY (EGD);  Surgeon: Rachael Fee, MD;  Location: [facility];  Service: Endoscopy;  Laterality: N/A;    Family History  Problem Relation Age of Onset  . Cancer Mother 38    gastric  . Hypertension Father     heart disease  . Cancer Maternal Aunt 91    breast cancer  . Cancer Maternal Uncle 58    colon    History  Substance Use Topics  . Smoking status: Never Smoker   . Smokeless tobacco: Never Used  . Alcohol Use: No    OB History    Grav Para Term Preterm Abortions TAB SAB Ect Mult Living                  Review of Systems  All other systems reviewed and are negative.    Allergies  Codeine; Other; Penicillins; and Sulfa antibiotics  Home Medications  Current Outpatient Rx  Name  Route  Sig  Dispense  Refill  . ATENOLOL 25 MG PO TABS   Oral   Take 50 mg by mouth 2 (two) times daily.         [name] CLOPIDOGREL BISULFATE 75 MG PO TABS   Oral   Take 75 mg by mouth daily.         . FENTANYL 12 MCG/HR TD PT72   Transdermal   Place 1 patch onto the skin every 3 (three) days.         Di Kindle SULFATE 325 (65 FE) MG PO TABS   Oral   Take 325 mg by mouth daily with breakfast.         . LIDOCAINE 5 % EX PTCH   Transdermal   Place 1 patch onto the skin daily. Remove & Discard patch within 12 hours or as directed by MD         . LISINOPRIL 2.5 MG PO TABS   Oral    Take 2.5 mg by mouth daily.         [name] LORAZEPAM 0.5 MG PO TABS   Oral   Take 0.5 mg by mouth at bedtime as needed. For anxiety         . METOCLOPRAMIDE HCL 5 MG PO TABS   Oral   Take 5 mg by mouth 3 (three) times daily as needed. For nausea         . OXYCODONE HCL 5 MG PO CAPS   Oral   Take 5 mg by mouth every 4 (four) hours as needed. For pain           BP 108/53  Pulse 76  Temp 98.9 F (37.2 C) (Oral)  Resp 16  SpO2 99%  Physical Exam  Nursing note and vitals reviewed. Constitutional: She is oriented to person, place, and time. No distress.       Thin and frail appearing female in no apparent distress  HENT:  Head: Atraumatic.       Tongue is dry  Eyes: Conjunctivae normal are normal. Right eye exhibits no discharge. Left eye exhibits no discharge.  Neck: Neck supple.  Cardiovascular: Normal rate and regular rhythm.  Exam reveals no gallop and no friction rub.   No murmur heard. Pulmonary/Chest: Effort normal. No respiratory distress. She exhibits no tenderness.  Abdominal: Soft. There is tenderness (Diffuse abdominal tenderness without point tenderness, no guarding, no rebound tenderness. Ostomy bag with normal appearing stoma to L abdomen.). There is no rebound and no guarding.  Musculoskeletal: She exhibits no tenderness.       ROM appears intact, no obvious focal weakness  Neurological: She is alert and oriented to person, place, and time.       Mental status and motor strength appears intact  Skin: No rash noted.  Psychiatric: She has a normal mood and affect.    ED Course  Procedures (including critical care time)  Labs Reviewed  CBC WITH DIFFERENTIAL - Abnormal; Notable for the following:    WBC 11.3 (*)     Hemoglobin 11.2 (*)     HCT 32.1 (*)     RDW 19.7 (*)     Platelets 403 (*)     Neutrophils Relative 81 (*)     Neutro Abs 9.2 (*)     Lymphocytes Relative 7 (*)     Monocytes Absolute 1.1 (*)     All other components within normal limits  COMPREHENSIVE METABOLIC PANEL  LIPASE, BLOOD  TROPONIN I  URINALYSIS, ROUTINE W REFLEX MICROSCOPIC   No results found.   No diagnosis found.   Date: 03/04/2012  Rate: 74  Rhythm: normal sinus rhythm  QRS Axis: normal  Intervals: normal  ST/T Wave abnormalities: nonspecific T wave changes  Conduction Disutrbances:none  Narrative Interpretation:   Old EKG Reviewed: unchanged  Results for orders placed during the hospital encounter of 03/04/12  CBC WITH DIFFERENTIAL      Component Value Range   WBC 11.3 (*) 4.0 - 10.5 K/uL   RBC 3.91  3.87 - 5.11 MIL/uL   Hemoglobin 11.2 (*) 12.0 - 15.0 g/dL   HCT 45.4 (*) 09.8 - 11.9 %   MCV 82.1  78.0 - 100.0 fL   MCH 28.6  26.0 - 34.0 pg   MCHC 34.9  30.0 - 36.0 g/dL   RDW 14.7 (*) 82.9 - 56.2 %   Platelets 403 (*) 150 - 400 K/uL   Neutrophils Relative 81 (*) 43 - 77 %   Neutro Abs 9.2 (*) 1.7 - 7.7 K/uL   Lymphocytes Relative 7 (*) 12 - 46 %   Lymphs Abs 0.8  0.7 - 4.0 K/uL   Monocytes Relative 10  3 - 12 %   Monocytes Absolute 1.1 (*) 0.1 - 1.0 K/uL   Eosinophils Relative 2  0 - 5 %   Eosinophils Absolute 0.2  0.0 - 0.7 K/uL   Basophils Relative 0  0 - 1 %   Basophils Absolute 0.0  0.0 - 0.1 K/uL  COMPREHENSIVE METABOLIC PANEL      Component Value Range   Sodium 138  135 - 145 mEq/L   Potassium 3.0 (*) 3.5 - 5.1 mEq/L   Chloride 103  96 - 112 mEq/L   CO2 25  19 - 32 mEq/L   Glucose, Bld 96  70 - 99 mg/dL   BUN 6  6 - 23 mg/dL   Creatinine, Ser 1.30  0.50 - 1.10 mg/dL   Calcium 8.7  8.4 - 86.5 mg/dL   Total Protein 4.3 (*) 6.0 - 8.3 g/dL   Albumin 2.2 (*) 3.5 - 5.2 g/dL   AST 22  0 - 37 U/L   ALT 10  0 - 35 U/L   Alkaline Phosphatase 80  39 - 117 U/L   Total Bilirubin 0.3  0.3 - 1.2 mg/dL   GFR calc non Af Amer 86 (*) >90 mL/min   GFR calc Af Amer >90  >90 mL/min  LIPASE, BLOOD      Component Value Range   Lipase 19  11 - 59 U/L  TROPONIN I      Component Value Range   Troponin I <0.30  <0.30 ng/mL  URINALYSIS,  ROUTINE W REFLEX MICROSCOPIC      Component Value Range   Color, Urine YELLOW  YELLOW   APPearance CLEAR  CLEAR   Specific Gravity, Urine 1.012  1.005 - 1.030   pH 6.5  5.0 - 8.0   Glucose, UA NEGATIVE  NEGATIVE mg/dL   Hgb urine dipstick NEGATIVE  NEGATIVE   Bilirubin Urine NEGATIVE  NEGATIVE   Ketones, ur NEGATIVE  NEGATIVE mg/dL   Protein, ur NEGATIVE  NEGATIVE mg/dL   Urobilinogen, UA 0.2  0.0 - 1.0 mg/dL   Nitrite NEGATIVE  NEGATIVE   Leukocytes, UA TRACE (*) NEGATIVE  URINE MICROSCOPIC-ADD ON      Component Value Range   Squamous Epithelial / LPF RARE  RARE  WBC, UA 0-2  <3 WBC/hpf   Crystals CA OXALATE CRYSTALS (*) NEGATIVE   No results found.  1. Abdominal pain  MDM  78 year old female with several comorbidities, including cancer, prior history of perforated diverticulitis status post colostomy. She has had intermittent abdominal pain for 4 days. Will check labs, UA, and we'll obtain abdominal CT scan for further evaluation. Patient denies any significant pain however pain is reproducible on abdominal examination.  Care discussed with my attending.  Pt currently pain free, will continue to monitor.     4:57 AM K+ 3.0, will give supplementation.  Will continua with CT scan.     6:19 AM My attending will continue pt care.    Fayrene Helper, PA-C 03/04/12 [phone]

## 2012-03-04 NOTE — Clinical Social Work Note (Signed)
CSW confirmed that pt is from [facility].  Their number is [phone].  CSW is available for assistance if needed.  Vickii Penna, LCSWA [phone]  Clinical Social Work

## 2012-03-04 NOTE — ED Provider Notes (Signed)
Medical screening examination/treatment/procedure(s) were conducted as a shared visit with non-physician practitioner(s) and myself.  I personally evaluated the patient during the encounter  Please see my separate respective documentation pertaining to this patient encounter   Vida Roller, MD 03/04/12 [phone]

## 2012-03-04 NOTE — ED Notes (Signed)
Pt requesting to have ptar bring her back to nursing facility instead of husband - called ptar. On route

## 2012-03-04 NOTE — ED Provider Notes (Addendum)
78 year old female with a history of abdominal surgery status post colostomy, status post lymphoma who presents with a complaint of ongoing intermittent chronic abdominal pain. She states that she does not have the pain at this time but it is intermittent over time. She has not had any vomiting but she does have nausea and a poor appetite which is also a chronic problem for her. She is currently in a rehabilitation facility. On exam the patient has a soft abdomen with mild tenderness in the periumbilical area, colostomy appears to be draining green-colored stool, the patient does not have any focal tenderness around the ostomy. There is no guarding, there is no paraspinal signs and her heart and lungs are clear. There is a mild systolic murmur. She has no peripheral edema, she appears in no acute distress and states that she only has pain after my abdominal exam. Labs have been ordered, CT scan to rule out any other postsurgical complications.  Ct results d/w pt and family - C dif ordered, Flagyl, Cipro ordered, K supp   Medical screening examination/treatment/procedure(s) were conducted as a shared visit with non-physician practitioner(s) and myself.  I personally evaluated the patient during the encounter    Vida Roller, MD 03/04/12 1610  Vida Roller, MD 03/04/12 [phone]

## 2012-03-04 NOTE — ED Notes (Signed)
Pt. Awaiting CT pt. Is finished with 1 cup.

## 2012-03-04 NOTE — ED Notes (Signed)
Patient transported to CT 

## 2012-03-04 NOTE — ED Notes (Signed)
WJX:[id number]<WG> Expected date:03/04/12<BR> Expected time: 2:46 AM<BR> Means of arrival:Ambulance<BR> Comments:<BR> N/V/D

## 2012-03-04 NOTE — ED Notes (Signed)
Brought in by EMS from [facility] with c/o abdominal pain with nausea for 4 days now.  Pt presents to ED A/Ox4, with abdominal pain 5/10, also c/o nausea; pt has colostomy to LUQ-- colostomy appliance in place and intact with small amount of liquid stool.  Pt has hx of perforated diverticulitis-- pt states pain is similar as the pain she had before.

## 2012-03-05 DIAGNOSIS — N390 Urinary tract infection, site not specified: Secondary | ICD-10-CM | POA: Diagnosis not present

## 2012-03-05 DIAGNOSIS — H1045 Other chronic allergic conjunctivitis: Secondary | ICD-10-CM | POA: Diagnosis not present

## 2012-03-05 DIAGNOSIS — R300 Dysuria: Secondary | ICD-10-CM | POA: Diagnosis not present

## 2012-03-11 ENCOUNTER — Ambulatory Visit: Payer: No Typology Code available for payment source | Admitting: Oncology

## 2012-03-11 ENCOUNTER — Other Ambulatory Visit: Payer: No Typology Code available for payment source | Admitting: Lab

## 2012-03-12 DIAGNOSIS — N390 Urinary tract infection, site not specified: Secondary | ICD-10-CM | POA: Diagnosis not present

## 2012-03-12 DIAGNOSIS — R300 Dysuria: Secondary | ICD-10-CM | POA: Diagnosis not present

## 2012-03-12 DIAGNOSIS — H1045 Other chronic allergic conjunctivitis: Secondary | ICD-10-CM | POA: Diagnosis not present

## 2012-03-21 DIAGNOSIS — E46 Unspecified protein-calorie malnutrition: Secondary | ICD-10-CM | POA: Diagnosis not present

## 2012-03-21 DIAGNOSIS — F329 Major depressive disorder, single episode, unspecified: Secondary | ICD-10-CM | POA: Diagnosis not present

## 2012-03-21 DIAGNOSIS — F411 Generalized anxiety disorder: Secondary | ICD-10-CM | POA: Diagnosis not present

## 2012-03-21 DIAGNOSIS — Z433 Encounter for attention to colostomy: Secondary | ICD-10-CM | POA: Diagnosis not present

## 2012-03-21 DIAGNOSIS — I5021 Acute systolic (congestive) heart failure: Secondary | ICD-10-CM | POA: Diagnosis not present

## 2012-03-21 DIAGNOSIS — C8589 Other specified types of non-Hodgkin lymphoma, extranodal and solid organ sites: Secondary | ICD-10-CM | POA: Diagnosis not present

## 2012-03-22 ENCOUNTER — Telehealth: Payer: Self-pay | Admitting: Medical Oncology

## 2012-03-22 DIAGNOSIS — Z8673 Personal history of transient ischemic attack (TIA), and cerebral infarction without residual deficits: Secondary | ICD-10-CM | POA: Diagnosis not present

## 2012-03-22 DIAGNOSIS — I10 Essential (primary) hypertension: Secondary | ICD-10-CM | POA: Diagnosis not present

## 2012-03-22 DIAGNOSIS — C8299 Follicular lymphoma, unspecified, extranodal and solid organ sites: Secondary | ICD-10-CM | POA: Diagnosis not present

## 2012-03-22 NOTE — Telephone Encounter (Signed)
Patient LVMOM requesting to see MD for an appt this week if possible. State she just came from nursing home. Is also requesting refill for Fentanyl. Patient has no appts scheduled at this time. Will review with MD/NP.

## 2012-03-22 NOTE — Telephone Encounter (Signed)
Spoke with patient regarding NP's questions. Patient states the ED physician ordered her fentanyl patches for "stomach pain." When asked patient how many she had left, patient stated she has 5 patches left. Re educated patient on directions of/use of patches as written on prescription and she should have enough until f/u appt with MD/NP, pt expressed understanding and no further questions at this time. Patient also requested to see Dr. Welton Flakes for f/u since she hasn't had an appt with her since August 2013. Will review appt with NP again. Informed pt scheduling will contact her re next appt.[name]

## 2012-03-22 NOTE — Telephone Encounter (Signed)
Why does she take Fentanyl?  Who normally refills this for her?  Next available appt is next week 1/8 or 1/9 on NP schedule.

## 2012-03-23 DIAGNOSIS — C8589 Other specified types of non-Hodgkin lymphoma, extranodal and solid organ sites: Secondary | ICD-10-CM | POA: Diagnosis not present

## 2012-03-23 DIAGNOSIS — Z433 Encounter for attention to colostomy: Secondary | ICD-10-CM | POA: Diagnosis not present

## 2012-03-23 DIAGNOSIS — F329 Major depressive disorder, single episode, unspecified: Secondary | ICD-10-CM | POA: Diagnosis not present

## 2012-03-23 DIAGNOSIS — F411 Generalized anxiety disorder: Secondary | ICD-10-CM | POA: Diagnosis not present

## 2012-03-23 DIAGNOSIS — I5021 Acute systolic (congestive) heart failure: Secondary | ICD-10-CM | POA: Diagnosis not present

## 2012-03-23 NOTE — Telephone Encounter (Signed)
If she only wants to see Dr. Welton Flakes, then put on first available appt with her, which may be several weeks away.  We can wait and review with her when she returns.

## 2012-03-25 DIAGNOSIS — Z433 Encounter for attention to colostomy: Secondary | ICD-10-CM | POA: Diagnosis not present

## 2012-03-25 DIAGNOSIS — I5021 Acute systolic (congestive) heart failure: Secondary | ICD-10-CM | POA: Diagnosis not present

## 2012-03-25 DIAGNOSIS — C8589 Other specified types of non-Hodgkin lymphoma, extranodal and solid organ sites: Secondary | ICD-10-CM | POA: Diagnosis not present

## 2012-03-25 DIAGNOSIS — F329 Major depressive disorder, single episode, unspecified: Secondary | ICD-10-CM | POA: Diagnosis not present

## 2012-03-25 DIAGNOSIS — F411 Generalized anxiety disorder: Secondary | ICD-10-CM | POA: Diagnosis not present

## 2012-03-26 DIAGNOSIS — F411 Generalized anxiety disorder: Secondary | ICD-10-CM | POA: Diagnosis not present

## 2012-03-26 DIAGNOSIS — Z433 Encounter for attention to colostomy: Secondary | ICD-10-CM | POA: Diagnosis not present

## 2012-03-26 DIAGNOSIS — C8589 Other specified types of non-Hodgkin lymphoma, extranodal and solid organ sites: Secondary | ICD-10-CM | POA: Diagnosis not present

## 2012-03-26 DIAGNOSIS — F329 Major depressive disorder, single episode, unspecified: Secondary | ICD-10-CM | POA: Diagnosis not present

## 2012-03-26 DIAGNOSIS — I5021 Acute systolic (congestive) heart failure: Secondary | ICD-10-CM | POA: Diagnosis not present

## 2012-03-26 NOTE — Telephone Encounter (Signed)
Per MD, patient to be schedule to see Dr Welton Flakes 03/30/12. Scheduling notified to schedule appt and notify pt.

## 2012-03-27 ENCOUNTER — Telehealth: Payer: Self-pay | Admitting: Oncology

## 2012-03-27 NOTE — Telephone Encounter (Signed)
lmonvm adviisng the pt of her appts on 03/30/2012

## 2012-03-29 ENCOUNTER — Telehealth: Payer: Self-pay | Admitting: *Deleted

## 2012-03-29 DIAGNOSIS — I5021 Acute systolic (congestive) heart failure: Secondary | ICD-10-CM | POA: Diagnosis not present

## 2012-03-29 DIAGNOSIS — F411 Generalized anxiety disorder: Secondary | ICD-10-CM | POA: Diagnosis not present

## 2012-03-29 DIAGNOSIS — Z433 Encounter for attention to colostomy: Secondary | ICD-10-CM | POA: Diagnosis not present

## 2012-03-29 DIAGNOSIS — C8589 Other specified types of non-Hodgkin lymphoma, extranodal and solid organ sites: Secondary | ICD-10-CM | POA: Diagnosis not present

## 2012-03-29 DIAGNOSIS — F329 Major depressive disorder, single episode, unspecified: Secondary | ICD-10-CM | POA: Diagnosis not present

## 2012-03-29 NOTE — Telephone Encounter (Signed)
Pt called states " I need a refill on the pain patch. I'm not sure if you know about what's happened to me but since I saw Dr. Arville Lime been out and on my back. My stomach was hurting and I went to the ED and woke up 5 weeks later from a coma and had holes in my rectum because of the cancer and now I have a colostomy. So I guess this is where the pain is coming from." Discussed with pt she has an appt to see MD 03/30/12 at 11am. Pt advised she will be here tomorrow and has enough until tomorrow, will discuss with MD at that time.

## 2012-03-30 ENCOUNTER — Telehealth: Payer: Self-pay | Admitting: Oncology

## 2012-03-30 ENCOUNTER — Other Ambulatory Visit (HOSPITAL_BASED_OUTPATIENT_CLINIC_OR_DEPARTMENT_OTHER): Payer: Medicare Other | Admitting: Lab

## 2012-03-30 ENCOUNTER — Encounter: Payer: Self-pay | Admitting: Oncology

## 2012-03-30 ENCOUNTER — Ambulatory Visit (HOSPITAL_BASED_OUTPATIENT_CLINIC_OR_DEPARTMENT_OTHER): Payer: Medicare Other | Admitting: Oncology

## 2012-03-30 VITALS — BP 106/73 | HR 73 | Temp 97.1°F | Resp 20 | Ht 59.0 in | Wt 93.4 lb

## 2012-03-30 DIAGNOSIS — C8280 Other types of follicular lymphoma, unspecified site: Secondary | ICD-10-CM

## 2012-03-30 DIAGNOSIS — C8298 Follicular lymphoma, unspecified, lymph nodes of multiple sites: Secondary | ICD-10-CM | POA: Diagnosis not present

## 2012-03-30 LAB — CBC WITH DIFFERENTIAL/PLATELET
BASO%: 1.1 % (ref 0.0–2.0)
Eosinophils Absolute: 0.1 10*3/uL (ref 0.0–0.5)
HCT: 34.4 % — ABNORMAL LOW (ref 34.8–46.6)
LYMPH%: 24.9 % (ref 14.0–49.7)
MCHC: 34.1 g/dL (ref 31.5–36.0)
MONO#: 0.5 10*3/uL (ref 0.1–0.9)
MONO%: 13.1 % (ref 0.0–14.0)
NEUT#: 2.2 10*3/uL (ref 1.5–6.5)
Platelets: 274 10*3/uL (ref 145–400)
RBC: 3.79 10*6/uL (ref 3.70–5.45)
RDW: 20.2 % — ABNORMAL HIGH (ref 11.2–14.5)
WBC: 3.9 10*3/uL (ref 3.9–10.3)

## 2012-03-30 LAB — COMPREHENSIVE METABOLIC PANEL (CC13)
ALT: 12 U/L (ref 0–55)
Albumin: 2.9 g/dL — ABNORMAL LOW (ref 3.5–5.0)
Alkaline Phosphatase: 73 U/L (ref 40–150)
CO2: 24 mEq/L (ref 22–29)
Potassium: 3.5 mEq/L (ref 3.5–5.1)
Sodium: 138 mEq/L (ref 136–145)
Total Bilirubin: 0.62 mg/dL (ref 0.20–1.20)
Total Protein: 5.1 g/dL — ABNORMAL LOW (ref 6.4–8.3)

## 2012-03-30 MED ORDER — FENTANYL 12 MCG/HR TD PT72: 1.0000 | MEDICATED_PATCH | TRANSDERMAL | Status: DC

## 2012-03-30 NOTE — Telephone Encounter (Signed)
gv pt appt schedule for April.  °

## 2012-03-30 NOTE — Patient Instructions (Addendum)
Fentanyl patches renewed  I will see you back in 3 -4 months

## 2012-03-30 NOTE — Progress Notes (Signed)
OFFICE PROGRESS NOTE  CC Dr. Tommie Ard Dr. 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  PRIOR THERAPY:  #1 patient had a PET/CT performed that revealed Upper normal sized axillary and subpectoral nodes, corresponding to hypermetabolism at PET.CT of the abdomen and pelvis revealed Pelvic adenopathy, consistent with active disease. Equivocal abdominal retroperitoneal findings. Borderline adenopathy without hypermetabolism superiorly. A small more inferior retroperitoneal node demonstrates mild nonspecific hypermetabolism at PET. Scattered liver lesions, technically too small to characterize  but most likely benign cysts or hemangiomas.  #2 patient will begin systemic chemotherapy as she is symptomatic from her lymphoma. I have recommended CVP Rituxan combination given every 21 days for a total of 4 cycles. Started on 09/05/11-11/2011  #3 patient with bowel perforation requiring emergent surgery followed by prolonged intensive care unit stay and most recently rehabilitation. Patient with lower back pain on fentanyl patches.  CURRENT THERAPY: Observation and pain management  INTERVAL HISTORY: [name] 79 y.o. female returns for followup visit . Her last visit with me was back in September 2013. She has had a very eventful course since then. She apparently coupled days after my visit developed abdominal pain subsequently had perforation of her bowels requiring emergent surgery. She was admitted to the intensive care urinalysis at [facility] requiring intubation. She also developed sepsis requiring broad spectrum antibiotics and fluid resuscitation as well as vasopressors. She eventually required a tracheostomy. After aggressive management she was able to come off the vent. She was transferred to physical therapy and rehabilitation inpatient. She was discharged recently to home. Patient is in a wheelchair today. She is accompanied by her  husband. She has lots significant amount of weight. She is on fentanyl patches for pain control. I will refill these. There are no plans for aggressive chemotherapy at this point.  MEDICAL HISTORY: Past Medical History  Diagnosis Date  . Cancer 07/08/11    follicular B cell lymphoma  . TIA (transient ischemic attack)   . Arthritis   . Hypertension   . Esophageal abnormality     strictures  . Arthritis 09/04/2011  . Dehydration 11/20/2011  . Diarrhea 11/20/2011  . Complication of anesthesia   . PONV (postoperative nausea and vomiting)     in the 1980's  . GERD (gastroesophageal reflux disease)   . Esophageal stricture     x 2    ALLERGIES:  is allergic to codeine; other; penicillins; and sulfa antibiotics.  MEDICATIONS:  Current Outpatient Prescriptions  Medication Sig Dispense Refill  . atenolol (TENORMIN) 25 MG tablet Take 25 mg by mouth 2 (two) times daily.       . clopidogrel (PLAVIX) 75 MG tablet Take 75 mg by mouth daily.      . fentaNYL (DURAGESIC - DOSED MCG/HR) 12 MCG/HR Place 1 patch (12.5 mcg total) onto the skin every 3 (three) days.  20 patch  0  . lisinopril (PRINIVIL,ZESTRIL) 2.5 MG tablet Take 2.5 mg by mouth daily.      . mirtazapine (REMERON) 15 MG tablet Take 7.5 mg by mouth at bedtime.      . Olopatadine HCl (PATADAY) 0.2 % SOLN Apply 1 drop to eye daily.      [name] oxycodone (OXY-IR) 5 MG capsule Take 5 mg by mouth every 4 (four) hours as needed. For pain        SURGICAL HISTORY:  Past Surgical History  Procedure Date  . Appendectomy   . Parotidectomy w/ neck dissection  total   . Tonsillectomy   . Abdominal hysterectomy     left ovaries  . Laparotomy 11/22/2011    Procedure: EXPLORATORY LAPAROTOMY;  Surgeon: Velora Heckler, MD;  Location: [facility];  Service: General;  Laterality: N/A;  drainage of pelvic abscess  . Colostomy revision 11/22/2011    Procedure: COLON RESECTION SIGMOID;  Surgeon: Velora Heckler, MD;  Location: [facility];  Service: General;  Laterality:  N/A;  . Colostomy 11/22/2011    Procedure: COLOSTOMY;  Surgeon: Velora Heckler, MD;  Location: [facility];  Service: General;  Laterality: N/A;  desending colostomy  . Esophagogastroduodenoscopy 01/06/2012    Procedure: ESOPHAGOGASTRODUODENOSCOPY (EGD);  Surgeon: Rachael Fee, MD;  Location: [facility] ENDOSCOPY;  Service: Endoscopy;  Laterality: N/A;    REVIEW OF SYSTEMS:  Pertinent items are noted in HPI.   PHYSICAL EXAMINATION: Lymph nodes: Cervical, supraclavicular, and axillary nodes normal. Cardio: regular rate and rhythm, S1, S2 normal, no murmur, click, rub or gallop GI: soft, non-tender; bowel sounds normal; no masses,  no organomegaly Extremities: extremities normal, atraumatic, no cyanosis or edema Neurologic: Grossly normal  ECOG PERFORMANCE STATUS: 1 - Symptomatic but completely ambulatory  Blood pressure 106/73, pulse 73, temperature 97.1 F (36.2 C), resp. rate 20, height 4\' 11"  (1.499 m), weight 93 lb 6.4 oz (42.366 kg).  LABORATORY DATA: Lab Results  Component Value Date   WBC 3.9 03/30/2012   HGB 11.7 03/30/2012   HCT 34.4* 03/30/2012   MCV 90.9 03/30/2012   PLT 274 03/30/2012      Chemistry      Component Value Date/Time   NA 138 03/30/2012 1045   NA 138 03/04/2012 0330   K 3.5 03/30/2012 1045   K 3.0* 03/04/2012 0330   CL 104 03/30/2012 1045   CL 103 03/04/2012 0330   CO2 24 03/30/2012 1045   CO2 25 03/04/2012 0330   BUN 10.0 03/30/2012 1045   BUN 6 03/04/2012 0330   CREATININE 0.8 03/30/2012 1045   CREATININE 0.59 03/04/2012 0330      Component Value Date/Time   CALCIUM 9.2 03/30/2012 1045   CALCIUM 8.7 03/04/2012 0330   ALKPHOS 73 03/30/2012 1045   ALKPHOS 80 03/04/2012 0330   AST 28 03/30/2012 1045   AST 22 03/04/2012 0330   ALT 12 03/30/2012 1045   ALT 10 03/04/2012 0330   BILITOT 0.62 03/30/2012 1045   BILITOT 0.3 03/04/2012 0330       RADIOGRAPHIC STUDIES:  ASSESSMENT: 79 year old female with  #1 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 She is status post  staging scans which does reveals disease in the chest axilla as well as pelvic region. Patient is also symptomatic with abdominal and pelvic pain.Patient went on to receive 3 cycles of CVP/R.  #2 Patient's had a prolonged hospitalization after she perforated her:. She underwent treatment surgically resection of the gut. She had a extended stay in the intensive care unit and most recently she was in long-term facility. She was recently discharged to home.Goals of care at this time are pain management and palliation. We are not planning on treating her with chemotherapy at this point.   PLAN:  #1 patient will receive Fentanyl patch refills.  #2 she will be seen back in about 3-4 months time for followup. Of course I can see her sooner if need arises.  All questions were answered. The patient knows to call the clinic with any problems, questions or concerns. We can certainly see the patient much  sooner if necessary.  I spent 15 minutes counseling the patient face to face. The total time spent in the appointment was 30 minutes.    Drue Second, MD Medical/Oncology [facility] [phone] (beeper) [phone] (Office)  03/30/2012, 11:52 AM

## 2012-03-31 DIAGNOSIS — Z433 Encounter for attention to colostomy: Secondary | ICD-10-CM | POA: Diagnosis not present

## 2012-03-31 DIAGNOSIS — C8589 Other specified types of non-Hodgkin lymphoma, extranodal and solid organ sites: Secondary | ICD-10-CM | POA: Diagnosis not present

## 2012-03-31 DIAGNOSIS — I5021 Acute systolic (congestive) heart failure: Secondary | ICD-10-CM | POA: Diagnosis not present

## 2012-03-31 DIAGNOSIS — F329 Major depressive disorder, single episode, unspecified: Secondary | ICD-10-CM | POA: Diagnosis not present

## 2012-03-31 DIAGNOSIS — F411 Generalized anxiety disorder: Secondary | ICD-10-CM | POA: Diagnosis not present

## 2012-04-01 ENCOUNTER — Ambulatory Visit: Payer: No Typology Code available for payment source | Admitting: Family

## 2012-04-01 ENCOUNTER — Other Ambulatory Visit: Payer: No Typology Code available for payment source | Admitting: Lab

## 2012-04-01 DIAGNOSIS — Z433 Encounter for attention to colostomy: Secondary | ICD-10-CM | POA: Diagnosis not present

## 2012-04-01 DIAGNOSIS — I5021 Acute systolic (congestive) heart failure: Secondary | ICD-10-CM | POA: Diagnosis not present

## 2012-04-01 DIAGNOSIS — F411 Generalized anxiety disorder: Secondary | ICD-10-CM | POA: Diagnosis not present

## 2012-04-01 DIAGNOSIS — C8589 Other specified types of non-Hodgkin lymphoma, extranodal and solid organ sites: Secondary | ICD-10-CM | POA: Diagnosis not present

## 2012-04-01 DIAGNOSIS — F329 Major depressive disorder, single episode, unspecified: Secondary | ICD-10-CM | POA: Diagnosis not present

## 2012-04-02 DIAGNOSIS — C8589 Other specified types of non-Hodgkin lymphoma, extranodal and solid organ sites: Secondary | ICD-10-CM | POA: Diagnosis not present

## 2012-04-02 DIAGNOSIS — I5021 Acute systolic (congestive) heart failure: Secondary | ICD-10-CM | POA: Diagnosis not present

## 2012-04-02 DIAGNOSIS — Z433 Encounter for attention to colostomy: Secondary | ICD-10-CM | POA: Diagnosis not present

## 2012-04-02 DIAGNOSIS — F329 Major depressive disorder, single episode, unspecified: Secondary | ICD-10-CM | POA: Diagnosis not present

## 2012-04-02 DIAGNOSIS — F411 Generalized anxiety disorder: Secondary | ICD-10-CM | POA: Diagnosis not present

## 2012-04-05 DIAGNOSIS — C8589 Other specified types of non-Hodgkin lymphoma, extranodal and solid organ sites: Secondary | ICD-10-CM | POA: Diagnosis not present

## 2012-04-05 DIAGNOSIS — I5021 Acute systolic (congestive) heart failure: Secondary | ICD-10-CM | POA: Diagnosis not present

## 2012-04-05 DIAGNOSIS — F411 Generalized anxiety disorder: Secondary | ICD-10-CM | POA: Diagnosis not present

## 2012-04-05 DIAGNOSIS — Z433 Encounter for attention to colostomy: Secondary | ICD-10-CM | POA: Diagnosis not present

## 2012-04-05 DIAGNOSIS — F329 Major depressive disorder, single episode, unspecified: Secondary | ICD-10-CM | POA: Diagnosis not present

## 2012-04-06 DIAGNOSIS — I5021 Acute systolic (congestive) heart failure: Secondary | ICD-10-CM | POA: Diagnosis not present

## 2012-04-06 DIAGNOSIS — F411 Generalized anxiety disorder: Secondary | ICD-10-CM | POA: Diagnosis not present

## 2012-04-06 DIAGNOSIS — Z433 Encounter for attention to colostomy: Secondary | ICD-10-CM | POA: Diagnosis not present

## 2012-04-06 DIAGNOSIS — Z23 Encounter for immunization: Secondary | ICD-10-CM | POA: Diagnosis not present

## 2012-04-06 DIAGNOSIS — F329 Major depressive disorder, single episode, unspecified: Secondary | ICD-10-CM | POA: Diagnosis not present

## 2012-04-06 DIAGNOSIS — C8589 Other specified types of non-Hodgkin lymphoma, extranodal and solid organ sites: Secondary | ICD-10-CM | POA: Diagnosis not present

## 2012-04-07 DIAGNOSIS — C8589 Other specified types of non-Hodgkin lymphoma, extranodal and solid organ sites: Secondary | ICD-10-CM | POA: Diagnosis not present

## 2012-04-07 DIAGNOSIS — F329 Major depressive disorder, single episode, unspecified: Secondary | ICD-10-CM | POA: Diagnosis not present

## 2012-04-07 DIAGNOSIS — Z433 Encounter for attention to colostomy: Secondary | ICD-10-CM | POA: Diagnosis not present

## 2012-04-07 DIAGNOSIS — I5021 Acute systolic (congestive) heart failure: Secondary | ICD-10-CM | POA: Diagnosis not present

## 2012-04-07 DIAGNOSIS — F411 Generalized anxiety disorder: Secondary | ICD-10-CM | POA: Diagnosis not present

## 2012-04-08 DIAGNOSIS — I5021 Acute systolic (congestive) heart failure: Secondary | ICD-10-CM | POA: Diagnosis not present

## 2012-04-08 DIAGNOSIS — Z433 Encounter for attention to colostomy: Secondary | ICD-10-CM | POA: Diagnosis not present

## 2012-04-08 DIAGNOSIS — F329 Major depressive disorder, single episode, unspecified: Secondary | ICD-10-CM | POA: Diagnosis not present

## 2012-04-08 DIAGNOSIS — C8589 Other specified types of non-Hodgkin lymphoma, extranodal and solid organ sites: Secondary | ICD-10-CM | POA: Diagnosis not present

## 2012-04-08 DIAGNOSIS — F411 Generalized anxiety disorder: Secondary | ICD-10-CM | POA: Diagnosis not present

## 2012-04-09 DIAGNOSIS — F411 Generalized anxiety disorder: Secondary | ICD-10-CM | POA: Diagnosis not present

## 2012-04-09 DIAGNOSIS — C8589 Other specified types of non-Hodgkin lymphoma, extranodal and solid organ sites: Secondary | ICD-10-CM | POA: Diagnosis not present

## 2012-04-09 DIAGNOSIS — I5021 Acute systolic (congestive) heart failure: Secondary | ICD-10-CM | POA: Diagnosis not present

## 2012-04-09 DIAGNOSIS — Z433 Encounter for attention to colostomy: Secondary | ICD-10-CM | POA: Diagnosis not present

## 2012-04-09 DIAGNOSIS — F329 Major depressive disorder, single episode, unspecified: Secondary | ICD-10-CM | POA: Diagnosis not present

## 2012-04-12 DIAGNOSIS — Z433 Encounter for attention to colostomy: Secondary | ICD-10-CM | POA: Diagnosis not present

## 2012-04-12 DIAGNOSIS — F411 Generalized anxiety disorder: Secondary | ICD-10-CM | POA: Diagnosis not present

## 2012-04-12 DIAGNOSIS — C8589 Other specified types of non-Hodgkin lymphoma, extranodal and solid organ sites: Secondary | ICD-10-CM | POA: Diagnosis not present

## 2012-04-12 DIAGNOSIS — I5021 Acute systolic (congestive) heart failure: Secondary | ICD-10-CM | POA: Diagnosis not present

## 2012-04-12 DIAGNOSIS — F329 Major depressive disorder, single episode, unspecified: Secondary | ICD-10-CM | POA: Diagnosis not present

## 2012-04-13 DIAGNOSIS — C8589 Other specified types of non-Hodgkin lymphoma, extranodal and solid organ sites: Secondary | ICD-10-CM | POA: Diagnosis not present

## 2012-04-13 DIAGNOSIS — F329 Major depressive disorder, single episode, unspecified: Secondary | ICD-10-CM | POA: Diagnosis not present

## 2012-04-13 DIAGNOSIS — I5021 Acute systolic (congestive) heart failure: Secondary | ICD-10-CM | POA: Diagnosis not present

## 2012-04-13 DIAGNOSIS — F411 Generalized anxiety disorder: Secondary | ICD-10-CM | POA: Diagnosis not present

## 2012-04-13 DIAGNOSIS — Z433 Encounter for attention to colostomy: Secondary | ICD-10-CM | POA: Diagnosis not present

## 2012-04-14 ENCOUNTER — Inpatient Hospital Stay (HOSPITAL_COMMUNITY)
Admission: EM | Admit: 2012-04-14 | Discharge: 2012-04-19 | DRG: 391 | Disposition: A | Discharge status: Home / Self Care | Payer: Medicare Other | Attending: Internal Medicine | Admitting: Internal Medicine

## 2012-04-14 ENCOUNTER — Encounter (HOSPITAL_COMMUNITY): Payer: Self-pay | Admitting: *Deleted

## 2012-04-14 ENCOUNTER — Emergency Department (HOSPITAL_COMMUNITY): Payer: Medicare Other

## 2012-04-14 ENCOUNTER — Telehealth: Payer: Self-pay | Admitting: *Deleted

## 2012-04-14 DIAGNOSIS — I4891 Unspecified atrial fibrillation: Secondary | ICD-10-CM | POA: Diagnosis not present

## 2012-04-14 DIAGNOSIS — Z433 Encounter for attention to colostomy: Secondary | ICD-10-CM | POA: Diagnosis not present

## 2012-04-14 DIAGNOSIS — Z681 Body mass index (BMI) 19 or less, adult: Secondary | ICD-10-CM

## 2012-04-14 DIAGNOSIS — Z933 Colostomy status: Secondary | ICD-10-CM | POA: Diagnosis not present

## 2012-04-14 DIAGNOSIS — C8589 Other specified types of non-Hodgkin lymphoma, extranodal and solid organ sites: Secondary | ICD-10-CM | POA: Diagnosis present

## 2012-04-14 DIAGNOSIS — Z8673 Personal history of transient ischemic attack (TIA), and cerebral infarction without residual deficits: Secondary | ICD-10-CM | POA: Diagnosis not present

## 2012-04-14 DIAGNOSIS — J9600 Acute respiratory failure, unspecified whether with hypoxia or hypercapnia: Secondary | ICD-10-CM

## 2012-04-14 DIAGNOSIS — M1990 Unspecified osteoarthritis, unspecified site: Secondary | ICD-10-CM

## 2012-04-14 DIAGNOSIS — E860 Dehydration: Secondary | ICD-10-CM | POA: Diagnosis not present

## 2012-04-14 DIAGNOSIS — C8280 Other types of follicular lymphoma, unspecified site: Secondary | ICD-10-CM

## 2012-04-14 DIAGNOSIS — R110 Nausea: Secondary | ICD-10-CM

## 2012-04-14 DIAGNOSIS — J90 Pleural effusion, not elsewhere classified: Secondary | ICD-10-CM

## 2012-04-14 DIAGNOSIS — E43 Unspecified severe protein-calorie malnutrition: Secondary | ICD-10-CM | POA: Diagnosis present

## 2012-04-14 DIAGNOSIS — B3781 Candidal esophagitis: Secondary | ICD-10-CM

## 2012-04-14 DIAGNOSIS — Z79899 Other long term (current) drug therapy: Secondary | ICD-10-CM

## 2012-04-14 DIAGNOSIS — R112 Nausea with vomiting, unspecified: Secondary | ICD-10-CM | POA: Diagnosis present

## 2012-04-14 DIAGNOSIS — A419 Sepsis, unspecified organism: Secondary | ICD-10-CM

## 2012-04-14 DIAGNOSIS — Z9049 Acquired absence of other specified parts of digestive tract: Secondary | ICD-10-CM

## 2012-04-14 DIAGNOSIS — Z8719 Personal history of other diseases of the digestive system: Secondary | ICD-10-CM

## 2012-04-14 DIAGNOSIS — K56600 Partial intestinal obstruction, unspecified as to cause: Secondary | ICD-10-CM

## 2012-04-14 DIAGNOSIS — E876 Hypokalemia: Secondary | ICD-10-CM | POA: Diagnosis not present

## 2012-04-14 DIAGNOSIS — I5021 Acute systolic (congestive) heart failure: Secondary | ICD-10-CM

## 2012-04-14 DIAGNOSIS — I509 Heart failure, unspecified: Secondary | ICD-10-CM | POA: Diagnosis not present

## 2012-04-14 DIAGNOSIS — K219 Gastro-esophageal reflux disease without esophagitis: Secondary | ICD-10-CM | POA: Diagnosis present

## 2012-04-14 DIAGNOSIS — K229 Disease of esophagus, unspecified: Secondary | ICD-10-CM

## 2012-04-14 DIAGNOSIS — Z930 Tracheostomy status: Secondary | ICD-10-CM

## 2012-04-14 DIAGNOSIS — F329 Major depressive disorder, single episode, unspecified: Secondary | ICD-10-CM | POA: Diagnosis not present

## 2012-04-14 DIAGNOSIS — R1310 Dysphagia, unspecified: Secondary | ICD-10-CM

## 2012-04-14 DIAGNOSIS — D638 Anemia in other chronic diseases classified elsewhere: Secondary | ICD-10-CM | POA: Diagnosis present

## 2012-04-14 DIAGNOSIS — F411 Generalized anxiety disorder: Secondary | ICD-10-CM | POA: Diagnosis not present

## 2012-04-14 DIAGNOSIS — K5720 Diverticulitis of large intestine with perforation and abscess without bleeding: Secondary | ICD-10-CM

## 2012-04-14 DIAGNOSIS — I10 Essential (primary) hypertension: Secondary | ICD-10-CM | POA: Diagnosis present

## 2012-04-14 DIAGNOSIS — R109 Unspecified abdominal pain: Secondary | ICD-10-CM | POA: Diagnosis not present

## 2012-04-14 DIAGNOSIS — K56609 Unspecified intestinal obstruction, unspecified as to partial versus complete obstruction: Secondary | ICD-10-CM | POA: Diagnosis not present

## 2012-04-14 DIAGNOSIS — R197 Diarrhea, unspecified: Secondary | ICD-10-CM

## 2012-04-14 DIAGNOSIS — C8590 Non-Hodgkin lymphoma, unspecified, unspecified site: Secondary | ICD-10-CM

## 2012-04-14 LAB — CBC WITH DIFFERENTIAL/PLATELET
Basophils Absolute: 0 10*3/uL (ref 0.0–0.1)
Basophils Relative: 0 % (ref 0–1)
Eosinophils Absolute: 0.1 10*3/uL (ref 0.0–0.7)
Eosinophils Relative: 1 % (ref 0–5)
MCH: 30.7 pg (ref 26.0–34.0)
MCV: 88.5 fL (ref 78.0–100.0)
Neutrophils Relative %: 71 % (ref 43–77)
Platelets: 268 10*3/uL (ref 150–400)
RDW: 17.5 % — ABNORMAL HIGH (ref 11.5–15.5)

## 2012-04-14 LAB — URINALYSIS, ROUTINE W REFLEX MICROSCOPIC
Glucose, UA: NEGATIVE mg/dL
Leukocytes, UA: NEGATIVE
Protein, ur: NEGATIVE mg/dL
Specific Gravity, Urine: 1.011 (ref 1.005–1.030)

## 2012-04-14 LAB — COMPREHENSIVE METABOLIC PANEL
ALT: 11 U/L (ref 0–35)
AST: 24 U/L (ref 0–37)
Albumin: 2.7 g/dL — ABNORMAL LOW (ref 3.5–5.2)
Calcium: 8.3 mg/dL — ABNORMAL LOW (ref 8.4–10.5)
GFR calc Af Amer: >90 mL/min (ref >90)
Glucose, Bld: 85 mg/dL (ref 70–99)
Potassium: 2.5 mEq/L — CL (ref 3.5–5.1)
Sodium: 139 mEq/L (ref 135–145)
Total Protein: 4.6 g/dL — ABNORMAL LOW (ref 6.0–8.3)

## 2012-04-14 MED ORDER — ONDANSETRON HCL 4 MG/2ML IJ SOLN
INTRAMUSCULAR | Status: AC
  Administered 2012-04-14: 4 mg via INTRAVENOUS
  Filled 2012-04-14: qty 2

## 2012-04-14 MED ORDER — MORPHINE SULFATE 4 MG/ML IJ SOLN
4.0000 mg | Freq: Once | INTRAMUSCULAR | Status: AC
  Administered 2012-04-14: 4 mg via INTRAVENOUS
  Filled 2012-04-14: qty 1

## 2012-04-14 MED ORDER — MORPHINE SULFATE 4 MG/ML IJ SOLN
4.0000 mg | Freq: Once | INTRAMUSCULAR | Status: AC
  Administered 2012-04-14: 4 mg via INTRAVENOUS

## 2012-04-14 MED ORDER — ONDANSETRON HCL 4 MG/2ML IJ SOLN
4.0000 mg | Freq: Once | INTRAMUSCULAR | Status: AC
  Administered 2012-04-14: 4 mg via INTRAVENOUS

## 2012-04-14 MED ORDER — ONDANSETRON HCL 4 MG/2ML IJ SOLN
4.0000 mg | Freq: Three times a day (TID) | INTRAMUSCULAR | Status: DC | PRN
  Administered 2012-04-14: 4 mg via INTRAVENOUS
  Filled 2012-04-14: qty 2

## 2012-04-14 MED ORDER — SODIUM CHLORIDE 0.9 % IV SOLN: INTRAVENOUS | Status: DC

## 2012-04-14 MED ORDER — IOHEXOL 300 MG/ML  SOLN: 50.0000 mL | Freq: Once | INTRAMUSCULAR | Status: AC | PRN

## 2012-04-14 MED ORDER — ONDANSETRON HCL 4 MG/2ML IJ SOLN
4.0000 mg | Freq: Once | INTRAMUSCULAR | Status: AC
  Administered 2012-04-14: 4 mg via INTRAVENOUS
  Filled 2012-04-14: qty 2

## 2012-04-14 MED ORDER — IOHEXOL 300 MG/ML  SOLN
80.0000 mL | Freq: Once | INTRAMUSCULAR | Status: AC | PRN
  Administered 2012-04-14: 80 mL via INTRAVENOUS

## 2012-04-14 MED ORDER — SODIUM CHLORIDE 0.9 % IV SOLN
INTRAVENOUS | Status: DC
  Administered 2012-04-14: 13:00:00 via INTRAVENOUS
  Administered 2012-04-14: 125 mL/h via INTRAVENOUS

## 2012-04-14 MED ORDER — POTASSIUM CHLORIDE 10 MEQ/100ML IV SOLN
INTRAVENOUS | Status: AC
  Administered 2012-04-14: 10 meq via INTRAVENOUS
  Filled 2012-04-14: qty 200

## 2012-04-14 MED ORDER — MORPHINE SULFATE 4 MG/ML IJ SOLN
INTRAMUSCULAR | Status: AC
  Administered 2012-04-14: 4 mg via INTRAVENOUS
  Filled 2012-04-14: qty 1

## 2012-04-14 MED ORDER — POTASSIUM CHLORIDE 10 MEQ/100ML IV SOLN
10.0000 meq | INTRAVENOUS | Status: AC
  Administered 2012-04-14 (×2): 10 meq via INTRAVENOUS
  Filled 2012-04-14 (×2): qty 100

## 2012-04-14 MED ORDER — POTASSIUM CHLORIDE CRYS ER 20 MEQ PO TBCR: 60.0000 meq | EXTENDED_RELEASE_TABLET | Freq: Once | ORAL | Status: DC

## 2012-04-14 MED ORDER — POTASSIUM CHLORIDE 10 MEQ/100ML IV SOLN
10.0000 meq | INTRAVENOUS | Status: AC
  Administered 2012-04-15 (×2): 10 meq via INTRAVENOUS
  Filled 2012-04-14 (×3): qty 100

## 2012-04-14 MED ORDER — POTASSIUM CHLORIDE 10 MEQ/100ML IV SOLN
10.0000 meq | INTRAVENOUS | Status: AC
  Administered 2012-04-14 (×2): 10 meq via INTRAVENOUS

## 2012-04-14 NOTE — Telephone Encounter (Signed)
Aniceto Boss, RN with [facility] called [facility], pt has been "sick "and throwing up, pt sent to ED for further evaluation. Message forward to Providers for review.

## 2012-04-14 NOTE — ED Provider Notes (Signed)
History     CSN: [id number]  Arrival date & time 04/14/12  1243   First MD Initiated Contact with Patient 04/14/12 1252      Chief Complaint  Patient presents with  . Abdominal Pain    (Consider location/radiation/quality/duration/timing/severity/associated sxs/prior treatment) HPI Comments: Patient brought to the ER by husband for evaluation of abdominal pain. Symptoms began this morning. She has had progressively worsening diffuse abdominal pain, moderate to severe. She has had nausea and has vomited once. She still feels very nauseated. Patient has a history of colostomy, placed in August of last year. Colostomy was performed after colectomy secondary to acute diverticulitis. Patient also has a history of lymphoma.  Patient is a 79 y.o. female presenting with abdominal pain.  Abdominal Pain The primary symptoms of the illness include abdominal pain, nausea and vomiting. The primary symptoms of the illness do not include fever.    Past Medical History  Diagnosis Date  . Cancer 07/08/11    follicular B cell lymphoma  . TIA (transient ischemic attack)   . Arthritis   . Hypertension   . Esophageal abnormality     strictures  . Arthritis 09/04/2011  . Dehydration 11/20/2011  . Diarrhea 11/20/2011  . Complication of anesthesia   . PONV (postoperative nausea and vomiting)     in the 1980's  . GERD (gastroesophageal reflux disease)   . Esophageal stricture     x 2    Past Surgical History  Procedure Date  . Appendectomy   . Parotidectomy w/ neck dissection total   . Tonsillectomy   . Abdominal hysterectomy     left ovaries  . Laparotomy 11/22/2011    Procedure: EXPLORATORY LAPAROTOMY;  Surgeon: Velora Heckler, MD;  Location: [facility];  Service: General;  Laterality: N/A;  drainage of pelvic abscess  . Colostomy revision 11/22/2011    Procedure: COLON RESECTION SIGMOID;  Surgeon: Velora Heckler, MD;  Location: [facility];  Service: General;  Laterality: N/A;  . Colostomy 11/22/2011    Procedure: COLOSTOMY;  Surgeon: Velora Heckler, MD;  Location: [facility];  Service: General;  Laterality: N/A;  desending colostomy  . Esophagogastroduodenoscopy 01/06/2012    Procedure: ESOPHAGOGASTRODUODENOSCOPY (EGD);  Surgeon: Rachael Fee, MD;  Location: [facility] ENDOSCOPY;  Service: Endoscopy;  Laterality: N/A;    Family History  Problem Relation Age of Onset  . Cancer Mother 99    gastric  . Hypertension Father     heart disease  . Cancer Maternal Aunt 20    breast cancer  . Cancer Maternal Uncle 58    colon    History  Substance Use Topics  . Smoking status: Never Smoker   . Smokeless tobacco: Never Used  . Alcohol Use: No    OB History    Grav Para Term Preterm Abortions TAB SAB Ect Mult Living                  Review of Systems  Constitutional: Negative for fever.  Gastrointestinal: Positive for nausea, vomiting and abdominal pain.  All other systems reviewed and are negative.    Allergies  Codeine; Other; Penicillins; and Sulfa antibiotics  Home Medications   Current Outpatient Rx  Name  Route  Sig  Dispense  Refill  . ATENOLOL 25 MG PO TABS   Oral   Take 25 mg by mouth 2 (two) times daily.          [name] CLOPIDOGREL BISULFATE 75 MG PO TABS   Oral  Take 75 mg by mouth daily.         . FENTANYL 12 MCG/HR TD PT72   Transdermal   Place 1 patch (12.5 mcg total) onto the skin every 3 (three) days.   20 patch   0   . LISINOPRIL 2.5 MG PO TABS   Oral   Take 2.5 mg by mouth daily.         [name] MIRTAZAPINE 15 MG PO TABS   Oral   Take 7.5 mg by mouth at bedtime.         . OLOPATADINE HCL 0.2 % OP SOLN   Ophthalmic   Apply 1 drop to eye daily.         . OXYCODONE HCL 5 MG PO CAPS   Oral   Take 5 mg by mouth every 4 (four) hours as needed. For pain           BP 152/65  Pulse 80  Temp 97.6 F (36.4 C) (Oral)  Resp 23  SpO2 99%  Physical Exam  Constitutional: She is oriented to person, place, and time. She appears well-developed and  well-nourished. She appears distressed.  HENT:  Head: Normocephalic and atraumatic.  Right Ear: Hearing normal.  Nose: Nose normal.  Mouth/Throat: Oropharynx is clear and moist and mucous membranes are normal.  Eyes: Conjunctivae normal and EOM are normal. Pupils are equal, round, and reactive to light.  Neck: Normal range of motion. Neck supple.  Cardiovascular: Normal rate, regular rhythm, S1 normal and S2 normal.  Exam reveals no gallop and no friction rub.   No murmur heard. Pulmonary/Chest: Effort normal and breath sounds normal. No respiratory distress. She exhibits no tenderness.  Abdominal: Soft. Normal appearance and bowel sounds are normal. There is no hepatosplenomegaly. There is generalized tenderness. There is no rebound, no guarding, no tenderness at McBurney's point and negative Murphy's sign. No hernia.  Musculoskeletal: Normal range of motion.  Neurological: She is alert and oriented to person, place, and time. She has normal strength. No cranial nerve deficit or sensory deficit. Coordination normal. GCS eye subscore is 4. GCS verbal subscore is 5. GCS motor subscore is 6.  Skin: Skin is warm, dry and intact. No rash noted. No cyanosis.  Psychiatric: She has a normal mood and affect. Her speech is normal and behavior is normal. Thought content normal.    ED Course  Procedures (including critical care time)   Labs Reviewed  CBC WITH DIFFERENTIAL  COMPREHENSIVE METABOLIC PANEL  LIPASE, BLOOD  LACTIC ACID, PLASMA  URINALYSIS, ROUTINE W REFLEX MICROSCOPIC   No results found.   1. Abdominal pain       MDM  Patient presented to the ER with complaints of abdominal pain. She has a history of diverticulitis requiring emergent surgery due to perforation. Patient was administered analgesia and workup was initiated. Work did not delineate etiology of the pain and therefore a CAT scan was ordered. At the end of my shift the CAT scan was still pending. Case was signed out to  oncoming ER physician, Dr. Ranae Palms to followup the CAT scan and disposition the patient.        Gilda Crease, MD 04/15/12 [phone]

## 2012-04-14 NOTE — H&P (Addendum)
[facility] History and Physical  [name]:[id number] DOB: 1932/01/20 DOA: 04/14/2012  Referring physician: Judith Blonder. PCP: Abner Greenspan, MD  Specialists: Biagio Quint.  Chief Complaint: Abdominal Pain.  HPI: [name] is a 79 y.o. female with past medical history significant for B-cell lymphoma, she was admitted to [facility] long 10-2011 with abdominal pain from perforated sigmoid diverticulum with peritonitis and pelvic abscess requiring laparotomy which required sigmoid colectomy and end colostomy.Prolonged stay on ventilator requiring tracheostomy. She went to rehab and then home.  She presents today to ED complaining of generalized abdominal pain that wake her up. She has some abdominal pain on and off but never persist. This pain has been constant, severe, 10/10 in intensity, sharp. She vomits 3 times, fluid content, greenish. Pain has improved with IV morphine.   She denies diarrhea or increase out put from colostomy    Review of Systems: The patient denies anorexia, fever,  vision loss, decreased hearing, hoarseness, chest pain, syncope, dyspnea on exertion, peripheral edema, balance deficits, hemoptysis,  melena,, hematuria, incontinence, genital sores, muscle weakness, suspicious skin lesions, transient blindness, difficulty walking, depression, unusual weight change, abnormal bleeding,   Past Medical History  Diagnosis Date  . Cancer 07/08/11    follicular B cell lymphoma  . TIA (transient ischemic attack)   . Arthritis   . Hypertension   . Esophageal abnormality     strictures  . Arthritis 09/04/2011  . Dehydration 11/20/2011  . Diarrhea 11/20/2011  . Complication of anesthesia   . PONV (postoperative nausea and vomiting)     in the 1980's  . GERD (gastroesophageal reflux disease)   . Esophageal stricture     x 2   Past Surgical History  Procedure Date  . Appendectomy   . Parotidectomy w/ neck dissection total   . Tonsillectomy   . Abdominal  hysterectomy     left ovaries  . Laparotomy 11/22/2011    Procedure: EXPLORATORY LAPAROTOMY;  Surgeon: Velora Heckler, MD;  Location: [facility];  Service: General;  Laterality: N/A;  drainage of pelvic abscess  . Colostomy revision 11/22/2011    Procedure: COLON RESECTION SIGMOID;  Surgeon: Velora Heckler, MD;  Location: [facility];  Service: General;  Laterality: N/A;  . Colostomy 11/22/2011    Procedure: COLOSTOMY;  Surgeon: Velora Heckler, MD;  Location: [facility];  Service: General;  Laterality: N/A;  desending colostomy  . Esophagogastroduodenoscopy 01/06/2012    Procedure: ESOPHAGOGASTRODUODENOSCOPY (EGD);  Surgeon: Rachael Fee, MD;  Location: [facility] ENDOSCOPY;  Service: Endoscopy;  Laterality: N/A;   Social History:  reports that she has never smoked. She has never used smokeless tobacco. She reports that she does not drink alcohol or use illicit drugs. she is married. She lives at home with husband. She has 2 son, both Doctors.    Allergies  Allergen Reactions  . Codeine Hives  . Other Nausea And Vomiting    anesthesia  . Penicillins Hives  . Sulfa Antibiotics Hives    Family History  Problem Relation Age of Onset  . Cancer Mother 41    gastric  . Hypertension Father     heart disease  . Cancer Maternal Aunt 76    breast cancer  . Cancer Maternal Uncle 33    colon    Prior to Admission medications   Medication Sig Start Date End Date Taking? Authorizing Provider  atenolol (TENORMIN) 25 MG tablet Take 12.5 mg by mouth 2 (two) times daily.    Yes Historical Provider,  MD  clopidogrel (PLAVIX) 75 MG tablet Take 75 mg by mouth daily.   Yes Historical Provider, MD  doxycycline (VIBRAMYCIN) 100 MG capsule Take 100 mg by mouth 2 (two) times daily.   Yes Historical Provider, MD  fentaNYL (DURAGESIC - DOSED MCG/HR) 12 MCG/HR Place 1 patch (12.5 mcg total) onto the skin every 3 (three) days. 03/30/12  Yes Victorino December, MD  Olopatadine HCl (PATADAY) 0.2 % SOLN Apply 1 drop to eye daily.   Yes  Historical Provider, MD   Physical Exam: Filed Vitals:   04/14/12 1346 04/14/12 1809 04/14/12 2002 04/14/12 2202  BP:  137/66 124/62 115/56  Pulse:  89 100 86  Temp:  98.1 F (36.7 C)  98.2 F (36.8 C)  TempSrc:  Oral  Oral  Resp:  20 18 18   SpO2: 99% 99% 98% 98%   General Appearance:    Alert, cooperative, no distress, appears stated age  Head:    Normocephalic, without obvious abnormality, atraumatic  Eyes:    PERRL, conjunctiva/corneas clear, EOM's intact,     Ears:    Normal TM's and external ear canals, both ears  Nose:   Nares normal, septum midline, mucosa normal, no drainage    or sinus tenderness  Throat:   Lips, mucosa, and tongue normal; teeth and gums normal  Neck:   Supple, symmetrical, trachea midline, no adenopathy;    thyroid:  no enlargement/tenderness/nodules; no carotid   bruit or JVD  Back:     Symmetric, no curvature, ROM normal, no CVA tenderness  Lungs:     Clear to auscultation bilaterally, respirations unlabored      Heart:    Regular rate and rhythm, S1 and S2 normal, no murmur, rub   or gallop     Abdomen:     Soft, -tender generalized, bowel sounds active all four quadrants,    no masses, no organomegaly, colostomy bag with  stool. No guarding, no rigidity.        Extremities:   Extremities normal, atraumatic, no cyanosis or edema  Pulses:   2+ and symmetric all extremities  Skin:   Skin color, texture, turgor normal, no rashes or lesions     Neurologic:   CNII-XII intact, normal strength, sensation and reflexes    throughout      Labs on Admission:  Basic Metabolic Panel:  Lab 04/14/12 9562  NA 139  K 2.5*  CL 100  CO2 23  GLUCOSE 85  BUN 9  CREATININE 0.55  CALCIUM 8.3*  MG --  PHOS --   Liver Function Tests:  Lab 04/14/12 1309  AST 24  ALT 11  ALKPHOS 49  BILITOT 0.6  PROT 4.6*  ALBUMIN 2.7*    Lab 04/14/12 1309  LIPASE 21  AMYLASE --   No results found for this basename: AMMONIA:5 in the last 168  hours CBC:  Lab 04/14/12 1309  WBC 5.4  NEUTROABS 3.8  HGB 11.2*  HCT 32.3*  MCV 88.5  PLT 268   Cardiac Enzymes: No results found for this basename: CKTOTAL:5,CKMB:5,CKMBINDEX:5,TROPONINI:5 in the last 168 hours  BNP (last 3 results) No results found for this basename: PROBNP:3 in the last 8760 hours CBG: No results found for this basename: GLUCAP:5 in the last 168 hours  Radiological Exams on Admission: Ct Abdomen Pelvis W Contrast  04/14/2012  *RADIOLOGY REPORT*  Clinical Data: Abdominal pain.  History of lymphoma and colostomy.  CT ABDOMEN AND PELVIS WITH CONTRAST  Technique:  Multidetector CT imaging  of the abdomen and pelvis was performed following the standard protocol during bolus administration of intravenous contrast.  Contrast: 80mL OMNIPAQUE IOHEXOL 300 MG/ML  SOLN the  Comparison: CT abdomen and pelvis 03/04/2012.  Findings: Small bilateral pleural effusions, larger on the left, are unchanged.  There is some mild basilar atelectasis.  Lung bases otherwise clear.  Tiny low attenuating lesion in the periphery of the right lobe of the liver on image 13 is unchanged.  Second low attenuating lesion in the dome of the liver on image 8 is also stable in appearance. No new liver lesion is identified.  The gallbladder, adrenal glands, spleen and pancreas are unremarkable.  Mild fullness of the right renal collecting system is new since the prior examination but no obstructing lesion is identified.  The kidneys are otherwise unremarkable.  Left lower quadrant colostomy is identified.  The patient has a Hartmann's pouch.  The stomach and small bowel appear normal.  No lymphadenopathy is identified.  There is no fluid collection.  No focal bony abnormality.  IMPRESSION:  1.  Mild fullness of the right renal collecting system is new since the comparison studies but no obstructing lesion is identified. 2.  Wall thickening of the descending colon seen on the most recent CT scan is not appreciated  on today's study compatible with resolved colitis. 3.  No change in small bilateral pleural effusions, larger on the left.   Original Report Authenticated By: Holley Dexter, M.D.     EKG: Independently reviewed. None.   Assessment/Plan Active Problems:  Abdominal pain  Hypokalemia  Lymphoma  1-Abdominal Pain: CT abdomen negative. Will treat for possible SBO. NPO, NG tube. IV fluids. Appreciate surgery help.  Lactic acid negative, lipase negative.  2-Hypokalemia: Has ordered 10 meq times 4 runs. I will add 2 more runs. Check Mg level. Repeat B-met.  3-Lymphoma: needs to follow up with oncologist after discharge.  4-History of Candida esophagitis. No oral candidiasis on physical exam.  5-HTN: Holding atenolol. Hold plavix also.   Code Status: Will need to be address in am. Patient wants to speak with family.  Family Communication: Plan of care discussed with patient directly. No family at bedside.  Disposition Plan: Expect 3 to 4 days inpatient.  Time spent: more than 60 minutes.   Smrithi Pigford [facility] Pager [phone]  If 7PM-7AM, please contact night-coverage [email or web address] Password [facility] 04/14/2012, 10:52 PM

## 2012-04-14 NOTE — ED Notes (Signed)
Pt arrives by [facility] from home with c/o abd pain since this am. Pt has lymphoma B and has a colostomy. Pt c/o generalized abd pain. Pt had 1 episode of vomiting pta. Denies diarrhea.

## 2012-04-14 NOTE — Consult Note (Signed)
Reason for Consult:abdominal pain Referring Physician: Dr. Fredderick Erb is an 79 y.o. female.  HPI: this patient presents to the emergency room for evaluation and diffuse abdominal discomfort and nausea with 2 episodes of bilious vomiting. She has a history of lymphoma in about 4 months ago was treated with chemotherapy for this but she also developed a perforated sigmoid diverticulitis which required sigmoid colectomy and end colostomy. She had a complicated and prolonged hospital stay as well as recovery and she has been in a rehabilitation facility recovering from this. She still has not fully recovered and has a poor appetite.  She has chronic abdominal pain for which she takes a Duragesic patch. She says that over the last one to 2 days she has had increasing upper abdominal pain with associated nausea and occasional vomiting. She says that she has not noticed any change in her ostomy in her ostomy is still with normal output.  Past Medical History  Diagnosis Date  . Cancer 07/08/11    follicular B cell lymphoma  . TIA (transient ischemic attack)   . Arthritis   . Hypertension   . Esophageal abnormality     strictures  . Arthritis 09/04/2011  . Dehydration 11/20/2011  . Diarrhea 11/20/2011  . Complication of anesthesia   . PONV (postoperative nausea and vomiting)     in the 1980's  . GERD (gastroesophageal reflux disease)   . Esophageal stricture     x 2    Past Surgical History  Procedure Date  . Appendectomy   . Parotidectomy w/ neck dissection total   . Tonsillectomy   . Abdominal hysterectomy     left ovaries  . Laparotomy 11/22/2011    Procedure: EXPLORATORY LAPAROTOMY;  Surgeon: Velora Heckler, MD;  Location: [facility];  Service: General;  Laterality: N/A;  drainage of pelvic abscess  . Colostomy revision 11/22/2011    Procedure: COLON RESECTION SIGMOID;  Surgeon: Velora Heckler, MD;  Location: [facility];  Service: General;  Laterality: N/A;  . Colostomy 11/22/2011    Procedure: COLOSTOMY;  Surgeon: Velora Heckler, MD;  Location: [facility];  Service: General;  Laterality: N/A;  desending colostomy  . Esophagogastroduodenoscopy 01/06/2012    Procedure: ESOPHAGOGASTRODUODENOSCOPY (EGD);  Surgeon: Rachael Fee, MD;  Location: [facility] ENDOSCOPY;  Service: Endoscopy;  Laterality: N/A;    Family History  Problem Relation Age of Onset  . Cancer Mother 80    gastric  . Hypertension Father     heart disease  . Cancer Maternal Aunt 88    breast cancer  . Cancer Maternal Uncle 77    colon    Social History:  reports that she has never smoked. She has never used smokeless tobacco. She reports that she does not drink alcohol or use illicit drugs.  Allergies:  Allergies  Allergen Reactions  . Codeine Hives  . Other Nausea And Vomiting    anesthesia  . Penicillins Hives  . Sulfa Antibiotics Hives    Medications: I have reviewed the patient's current medications.  Results for orders placed during the hospital encounter of 04/14/12 (from the past 48 hour(s))  CBC WITH DIFFERENTIAL     Status: Abnormal   Collection Time   04/14/12  1:09 PM      Component Value Range Comment   WBC 5.4  4.0 - 10.5 K/uL    RBC 3.65 (*) 3.87 - 5.11 MIL/uL    Hemoglobin 11.2 (*) 12.0 - 15.0 g/dL  HCT 32.3 (*) 36.0 - 46.0 %    MCV 88.5  78.0 - 100.0 fL    MCH 30.7  26.0 - 34.0 pg    MCHC 34.7  30.0 - 36.0 g/dL    RDW 40.9 (*) 81.1 - 15.5 %    Platelets 268  150 - 400 K/uL    Neutrophils Relative 71  43 - 77 %    Neutro Abs 3.8  1.7 - 7.7 K/uL    Lymphocytes Relative 17  12 - 46 %    Lymphs Abs 0.9  0.7 - 4.0 K/uL    Monocytes Relative 11  3 - 12 %    Monocytes Absolute 0.6  0.1 - 1.0 K/uL    Eosinophils Relative 1  0 - 5 %    Eosinophils Absolute 0.1  0.0 - 0.7 K/uL    Basophils Relative 0  0 - 1 %    Basophils Absolute 0.0  0.0 - 0.1 K/uL   COMPREHENSIVE METABOLIC PANEL     Status: Abnormal   Collection Time   04/14/12  1:09 PM      Component Value Range Comment    Sodium 139  135 - 145 mEq/L    Potassium 2.5 (*) 3.5 - 5.1 mEq/L    Chloride 100  96 - 112 mEq/L    CO2 23  19 - 32 mEq/L    Glucose, Bld 85  70 - 99 mg/dL    BUN 9  6 - 23 mg/dL    Creatinine, Ser 9.14  0.50 - 1.10 mg/dL    Calcium 8.3 (*) 8.4 - 10.5 mg/dL    Total Protein 4.6 (*) 6.0 - 8.3 g/dL    Albumin 2.7 (*) 3.5 - 5.2 g/dL    AST 24  0 - 37 U/L    ALT 11  0 - 35 U/L    Alkaline Phosphatase 49  39 - 117 U/L    Total Bilirubin 0.6  0.3 - 1.2 mg/dL    GFR calc non Af Amer 88 (*) >90 mL/min    GFR calc Af Amer >90  >90 mL/min   LIPASE, BLOOD     Status: Normal   Collection Time   04/14/12  1:09 PM      Component Value Range Comment   Lipase 21  11 - 59 U/L   LACTIC ACID, PLASMA     Status: Normal   Collection Time   04/14/12  1:09 PM      Component Value Range Comment   Lactic Acid, Venous 1.5  0.5 - 2.2 mmol/L   URINALYSIS, ROUTINE W REFLEX MICROSCOPIC     Status: Abnormal   Collection Time   04/14/12  1:27 PM      Component Value Range Comment   Color, Urine YELLOW  YELLOW    APPearance CLEAR  CLEAR    Specific Gravity, Urine 1.011  1.005 - 1.030    pH 7.5  5.0 - 8.0    Glucose, UA NEGATIVE  NEGATIVE mg/dL    Hgb urine dipstick NEGATIVE  NEGATIVE    Bilirubin Urine NEGATIVE  NEGATIVE    Ketones, ur 15 (*) NEGATIVE mg/dL    Protein, ur NEGATIVE  NEGATIVE mg/dL    Urobilinogen, UA 0.2  0.0 - 1.0 mg/dL    Nitrite NEGATIVE  NEGATIVE    Leukocytes, UA NEGATIVE  NEGATIVE MICROSCOPIC NOT DONE ON URINES WITH NEGATIVE PROTEIN, BLOOD, LEUKOCYTES, NITRITE, OR GLUCOSE <1000 mg/dL.    Ct Abdomen  Pelvis W Contrast  04/14/2012  *RADIOLOGY REPORT*  Clinical Data: Abdominal pain.  History of lymphoma and colostomy.  CT ABDOMEN AND PELVIS WITH CONTRAST  Technique:  Multidetector CT imaging of the abdomen and pelvis was performed following the standard protocol during bolus administration of intravenous contrast.  Contrast: 80mL OMNIPAQUE IOHEXOL 300 MG/ML  SOLN the  Comparison: CT  abdomen and pelvis 03/04/2012.  Findings: Small bilateral pleural effusions, larger on the left, are unchanged.  There is some mild basilar atelectasis.  Lung bases otherwise clear.  Tiny low attenuating lesion in the periphery of the right lobe of the liver on image 13 is unchanged.  Second low attenuating lesion in the dome of the liver on image 8 is also stable in appearance. No new liver lesion is identified.  The gallbladder, adrenal glands, spleen and pancreas are unremarkable.  Mild fullness of the right renal collecting system is new since the prior examination but no obstructing lesion is identified.  The kidneys are otherwise unremarkable.  Left lower quadrant colostomy is identified.  The patient has a Hartmann's pouch.  The stomach and small bowel appear normal.  No lymphadenopathy is identified.  There is no fluid collection.  No focal bony abnormality.  IMPRESSION:  1.  Mild fullness of the right renal collecting system is new since the comparison studies but no obstructing lesion is identified. 2.  Wall thickening of the descending colon seen on the most recent CT scan is not appreciated on today's study compatible with resolved colitis. 3.  No change in small bilateral pleural effusions, larger on the left.   Original Report Authenticated By: Holley Dexter, M.D.     All other review of systems negative or noncontributory except as stated in the HPI  Blood pressure 124/62, pulse 100, temperature 98.1 F (36.7 C), temperature source Oral, resp. rate 18, SpO2 98.00%. General appearance: alert, cooperative and no distress Neck: no JVD and supple, symmetrical, trachea midline Resp: nonlabored Cardio: normal rate, regular GI: soft, minimal upper abdominal tenderness, mild distension and tympany, ostomy looks okay with feculent output, no incisional or inguinal hernias identified, no peritoneal signs Extremities: mild LE edema Skin: Skin color, texture, turgor normal. No rashes or  lesions Neurologic: Grossly normal  Assessment/Plan: Abdominal pain and nausea Not certain what is causing her abdominal pain and nausea but she appears to have a significant amount of gastric distention on imaging and on exam. This may be due to a fairly proximal small bowel obstruction or gastroenteritis or ischemic bowel (but no evidence of this currently) or gastric outlet obstruction although she does have contrast in the small bowel and it actually appears to reach the ostomy. I do not see any evidence of any obvious surgical cause at this time and I have reviewed the images with the patient and her family including her son who is at the bedside. Again, I do not see any need for emergent surgery but I would recommend NG tube placement and IV hydration and electrolyte replacement and admission for observation. Surgery would be happy to follow along while she is admitted  Lodema Pilot DAVID 04/14/2012, 8:40 PM

## 2012-04-14 NOTE — ED Notes (Signed)
[id number]<VO> Expected date:<BR> Expected time:<BR> Means of arrival:Ambulance<BR> Comments:<BR> Abdominal pain

## 2012-04-14 NOTE — ED Notes (Signed)
Patient transported to CT 

## 2012-04-15 DIAGNOSIS — J9600 Acute respiratory failure, unspecified whether with hypoxia or hypercapnia: Secondary | ICD-10-CM | POA: Diagnosis not present

## 2012-04-15 DIAGNOSIS — I509 Heart failure, unspecified: Secondary | ICD-10-CM | POA: Diagnosis not present

## 2012-04-15 DIAGNOSIS — I5021 Acute systolic (congestive) heart failure: Secondary | ICD-10-CM

## 2012-04-15 DIAGNOSIS — R110 Nausea: Secondary | ICD-10-CM | POA: Diagnosis not present

## 2012-04-15 DIAGNOSIS — R109 Unspecified abdominal pain: Secondary | ICD-10-CM | POA: Diagnosis not present

## 2012-04-15 DIAGNOSIS — M129 Arthropathy, unspecified: Secondary | ICD-10-CM

## 2012-04-15 LAB — COMPREHENSIVE METABOLIC PANEL
Albumin: 2.2 g/dL — ABNORMAL LOW (ref 3.5–5.2)
Alkaline Phosphatase: 42 U/L (ref 39–117)
BUN: 6 mg/dL (ref 6–23)
Chloride: 104 mEq/L (ref 96–112)
Creatinine, Ser: 0.57 mg/dL (ref 0.50–1.10)
GFR calc Af Amer: >90 mL/min (ref >90)
GFR calc non Af Amer: 87 mL/min — ABNORMAL LOW (ref >90)
Glucose, Bld: 77 mg/dL (ref 70–99)
Potassium: 3.6 mEq/L (ref 3.5–5.1)
Total Bilirubin: 0.4 mg/dL (ref 0.3–1.2)

## 2012-04-15 LAB — CBC
Platelets: 264 10*3/uL (ref 150–400)
RBC: 3.27 MIL/uL — ABNORMAL LOW (ref 3.87–5.11)
RDW: 17.9 % — ABNORMAL HIGH (ref 11.5–15.5)
WBC: 6.4 10*3/uL (ref 4.0–10.5)

## 2012-04-15 MED ORDER — ACETAMINOPHEN 650 MG RE SUPP: 650.0000 mg | Freq: Four times a day (QID) | RECTAL | Status: DC | PRN

## 2012-04-15 MED ORDER — KCL IN DEXTROSE-NACL 20-5-0.45 MEQ/L-%-% IV SOLN
INTRAVENOUS | Status: DC
  Administered 2012-04-15 – 2012-04-17 (×6): via INTRAVENOUS
  Administered 2012-04-18: 1000 mL via INTRAVENOUS
  Administered 2012-04-18: 50 mL/h via INTRAVENOUS
  Filled 2012-04-15 (×12): qty 1000

## 2012-04-15 MED ORDER — MAGIC MOUTHWASH
10.0000 mL | ORAL | Status: DC | PRN
  Filled 2012-04-15: qty 10

## 2012-04-15 MED ORDER — SODIUM CHLORIDE 0.9 % IJ SOLN
10.0000 mL | INTRAMUSCULAR | Status: DC | PRN
  Administered 2012-04-15 – 2012-04-19 (×6): 10 mL

## 2012-04-15 MED ORDER — ONDANSETRON HCL 4 MG PO TABS: 4.0000 mg | ORAL_TABLET | Freq: Four times a day (QID) | ORAL | Status: DC | PRN

## 2012-04-15 MED ORDER — ENOXAPARIN SODIUM 30 MG/0.3ML ~~LOC~~ SOLN
30.0000 mg | SUBCUTANEOUS | Status: DC
  Administered 2012-04-15 – 2012-04-19 (×5): 30 mg via SUBCUTANEOUS
  Filled 2012-04-15 (×5): qty 0.3

## 2012-04-15 MED ORDER — HYDROCODONE-ACETAMINOPHEN 5-325 MG PO TABS
1.0000 | ORAL_TABLET | ORAL | Status: DC | PRN
  Filled 2012-04-15: qty 2

## 2012-04-15 MED ORDER — OLOPATADINE HCL 0.1 % OP SOLN
1.0000 [drp] | Freq: Every day | OPHTHALMIC | Status: DC
  Administered 2012-04-15 – 2012-04-19 (×5): 1 [drp] via OPHTHALMIC
  Filled 2012-04-15 (×2): qty 5

## 2012-04-15 MED ORDER — ACETAMINOPHEN 325 MG PO TABS: 650.0000 mg | ORAL_TABLET | Freq: Four times a day (QID) | ORAL | Status: DC | PRN

## 2012-04-15 MED ORDER — ONDANSETRON HCL 4 MG/2ML IJ SOLN: 4.0000 mg | Freq: Four times a day (QID) | INTRAMUSCULAR | Status: DC | PRN

## 2012-04-15 MED ORDER — MORPHINE SULFATE 2 MG/ML IJ SOLN
2.0000 mg | INTRAMUSCULAR | Status: DC | PRN
  Administered 2012-04-15 – 2012-04-16 (×3): 2 mg via INTRAVENOUS
  Filled 2012-04-15 (×3): qty 1

## 2012-04-15 MED ORDER — SODIUM CHLORIDE 0.9 % IJ SOLN
3.0000 mL | Freq: Two times a day (BID) | INTRAMUSCULAR | Status: DC
  Administered 2012-04-15 – 2012-04-18 (×3): 3 mL via INTRAVENOUS

## 2012-04-15 MED ORDER — FENTANYL 12 MCG/HR TD PT72
12.5000 ug | MEDICATED_PATCH | TRANSDERMAL | Status: DC
  Administered 2012-04-17: 12.5 ug via TRANSDERMAL
  Filled 2012-04-15: qty 1

## 2012-04-15 MED ORDER — PANTOPRAZOLE SODIUM 40 MG IV SOLR
40.0000 mg | Freq: Every day | INTRAVENOUS | Status: DC
  Administered 2012-04-15 – 2012-04-18 (×5): 40 mg via INTRAVENOUS
  Filled 2012-04-15 (×6): qty 40

## 2012-04-15 NOTE — Progress Notes (Signed)
Gastroparesis? , No obstruction, she can get up and I wrote for pt they are concerned about her losing gains after last surgery.   Continue ng drainage.  Her ostomy is working and she does not appear to really have obstruction.  May very well need gi to see tomorrow also.  She is followed by [facility], Dr. Matthias Hughs

## 2012-04-15 NOTE — Progress Notes (Signed)
Subjective: Major complaint is her NG, no further nausea, her NG was on straight suction not intermittent.    Objective: Vital signs in last 24 hours: Temp:  [97.6 F (36.4 C)-99.2 F (37.3 C)] 99.2 F (37.3 C) (01/23 0600) Pulse Rate:  [80-100] 98  (01/23 0600) Resp:  [11-23] 16  (01/23 0600) BP: (106-152)/(26-69) 114/62 mmHg (01/23 0600) SpO2:  [95 %-100 %] 95 % (01/23 0600) Weight:  [95 lb 14.4 oz (43.5 kg)-99 lb 3.3 oz (45 kg)] 99 lb 3.3 oz (45 kg) (01/23 0600) Last BM Date: 04/15/12 600-700 ml in NG cannister, afebrile, VSS, labs OK  Intake/Output from previous day: 01/22 0701 - 01/23 0700 In: 473.3 [I.V.:463.3; IV Piggyback:10] Out: 500 [Emesis/NG output:500] Intake/Output this shift:    General appearance: alert, cooperative and no distress GI: soft, non-tender; bowel sounds normal; no masses,  no organomegaly and some stool in the ostomy bag just a smear, but I don't how long it's been there.  Lab Results:   Basename 04/15/12 0541 04/14/12 1309  WBC 6.4 5.4  HGB 10.0* 11.2*  HCT 29.6* 32.3*  PLT 264 268    BMET  Basename 04/15/12 0541 04/14/12 1309  NA 137 139  K 3.6 2.5*  CL 104 100  CO2 23 23  GLUCOSE 77 85  BUN 6 9  CREATININE 0.57 0.55  CALCIUM 7.4* 8.3*   PT/INR No results found for this basename: LABPROT:2,INR:2 in the last 72 hours   Lab 04/15/12 0541 04/14/12 1309  AST 18 24  ALT 9 11  ALKPHOS 42 49  BILITOT 0.4 0.6  PROT 4.1* 4.6*  ALBUMIN 2.2* 2.7*     Lipase     Component Value Date/Time   LIPASE 21 04/14/2012 1309     Studies/Results: Ct Abdomen Pelvis W Contrast  04/14/2012  *RADIOLOGY REPORT*  Clinical Data: Abdominal pain.  History of lymphoma and colostomy.  CT ABDOMEN AND PELVIS WITH CONTRAST  Technique:  Multidetector CT imaging of the abdomen and pelvis was performed following the standard protocol during bolus administration of intravenous contrast.  Contrast: 80mL OMNIPAQUE IOHEXOL 300 MG/ML  SOLN the  Comparison: CT  abdomen and pelvis 03/04/2012.  Findings: Small bilateral pleural effusions, larger on the left, are unchanged.  There is some mild basilar atelectasis.  Lung bases otherwise clear.  Tiny low attenuating lesion in the periphery of the right lobe of the liver on image 13 is unchanged.  Second low attenuating lesion in the dome of the liver on image 8 is also stable in appearance. No new liver lesion is identified.  The gallbladder, adrenal glands, spleen and pancreas are unremarkable.  Mild fullness of the right renal collecting system is new since the prior examination but no obstructing lesion is identified.  The kidneys are otherwise unremarkable.  Left lower quadrant colostomy is identified.  The patient has a Hartmann's pouch.  The stomach and small bowel appear normal.  No lymphadenopathy is identified.  There is no fluid collection.  No focal bony abnormality.  IMPRESSION:  1.  Mild fullness of the right renal collecting system is new since the comparison studies but no obstructing lesion is identified. 2.  Wall thickening of the descending colon seen on the most recent CT scan is not appreciated on today's study compatible with resolved colitis. 3.  No change in small bilateral pleural effusions, larger on the left.   Original Report Authenticated By: Holley Dexter, M.D.     Medications:    . enoxaparin (LOVENOX)  injection  30 mg Subcutaneous Q24H  . fentaNYL  12.5 mcg Transdermal Q72H  . olopatadine  1 drop Both Eyes Daily  . pantoprazole (PROTONIX) IV  40 mg Intravenous QHS  . sodium chloride  3 mL Intravenous Q12H    Assessment/Plan Abdominal pain, nausea and vomiting; s/p perforated sigmoid diverticulitis with colectomy/colostomy, prolonged course with tracheostomy 10/2011. CT scan shows no sign of obstruction B cell lymphoma Hx of TIA Hypertension Hx of Esophageal strictures Gerd   Plan: I don't see any surgical issue at this time, will follow with you.    LOS: 1 day     [name] 04/15/2012

## 2012-04-15 NOTE — Progress Notes (Signed)
Patient ID: [name], female   DOB: 1932-02-23, 79 y.o.   MRN: [id number]  [facility] PROGRESS NOTE  [name] VHQ:[id number] DOB: 27-May-1931 DOA: 04/14/2012 PCP: Abner Greenspan, MD  Brief narrative: Pt is 79 y.o. female with past medical history significant for B-cell lymphoma, she was admitted to [facility] long 10-2011 with abdominal pain from perforated sigmoid diverticulum with peritonitis and pelvic abscess requiring laparotomy and sigmoid colectomy, and colostomy. Prolonged stay on ventilator requiring tracheostomy. She went to rehab and then home. She presented to [facility] ED with main concern of generalized abdominal pain, intermittent and sharp in nature and 10/10 in severity when present, radiating to lower abdominal quadrants, aggravated by food intake and with no specific alleviating factors, associated with 3 episodes of non bloody, bilious vomiting.  In ED, pt has received morphine and NGT placed. [facility] asked to admit for further evaluation. Surgery consulted.   Active Problems:  Abdominal pain, nausea and vomiting - per surgery this is unlikely to be caused by an obstruction, ? Gastroparesis - will ask Gi for recommendations - continue NG tube for now - supportive care and supplement electrolytes as needed  Hypokalemia - secondary to vomiting - supplemented and within normal limits this AM - BMP in AM  Anemia of chronic disease - in pt with lymphoma  - slight drop in Hg but still at baseline - CBC In AM  Consultants:  Surgery   Procedures/Studies: Ct Abdomen Pelvis W Contrast 04/14/2012   1.  Mild fullness of the right renal collecting system is new since the comparison studies but no obstructing lesion is identified.  2.  Wall thickening of the descending colon seen on the most recent CT scan is not appreciated on today's study compatible with resolved colitis.  3.  No change in small bilateral pleural effusions, larger on the left.      Antibiotics:  None  Code Status: Full Family Communication: Pt at bedside Disposition Plan: Home when medically stable  HPI/Subjective: No events overnight.   Objective: Filed Vitals:   04/14/12 2330 04/15/12 0015 04/15/12 0600 04/15/12 1345  BP: 122/56 129/69 114/62 117/63  Pulse: 96 100 98 98  Temp:  98 F (36.7 C) 99.2 F (37.3 C) 99 F (37.2 C)  TempSrc:  Oral Axillary Oral  Resp: 14 16 16 18   Height:  4\' 11"  (1.499 m)    Weight:  43.5 kg (95 lb 14.4 oz) 45 kg (99 lb 3.3 oz)   SpO2: 96% 100% 95% 96%    Intake/Output Summary (Last 24 hours) at 04/15/12 1745 Last data filed at 04/15/12 1500  Gross per 24 hour  Intake 1373.33 ml  Output    780 ml  Net 593.33 ml    Exam:   General:  Pt is alert, follows commands appropriately, not in acute distress  Cardiovascular: Regular rate and rhythm, S1/S2, no murmurs, no rubs, no gallops  Respiratory: Clear to auscultation bilaterally, no wheezing, no crackles, no rhonchi  Abdomen: Soft, slightly tender in epigastric area, mildly distended, NGT with bilious drainage, ostomy in place  Extremities: No edema, pulses DP and PT palpable bilaterally  Neuro: Grossly nonfocal  Data Reviewed: Basic Metabolic Panel:  Lab 04/15/12 4132 04/14/12 2340 04/14/12 1309  NA 137 -- 139  K 3.6 -- 2.5*  CL 104 -- 100  CO2 23 -- 23  GLUCOSE 77 -- 85  BUN 6 -- 9  CREATININE 0.57 -- 0.55  CALCIUM 7.4* -- 8.3*  MG -- 1.3* --  PHOS -- -- --   Liver Function Tests:  Lab 04/15/12 0541 04/14/12 1309  AST 18 24  ALT 9 11  ALKPHOS 42 49  BILITOT 0.4 0.6  PROT 4.1* 4.6*  ALBUMIN 2.2* 2.7*    Lab 04/14/12 1309  LIPASE 21  AMYLASE --   CBC:  Lab 04/15/12 0541 04/14/12 1309  WBC 6.4 5.4  NEUTROABS -- 3.8  HGB 10.0* 11.2*  HCT 29.6* 32.3*  MCV 90.5 88.5  PLT 264 268   Scheduled Meds:   . enoxaparin (LOVENOX) injection  30 mg Subcutaneous Q24H  . fentaNYL  12.5 mcg Transdermal Q72H  . olopatadine  1 drop Both Eyes  Daily  . pantoprazole (PROTONIX) IV  40 mg Intravenous QHS  . sodium chloride  3 mL Intravenous Q12H   Continuous Infusions:   . dextrose 5 % and 0.45 % NaCl with KCl 20 mEq/L 100 mL/hr at 04/15/12 1129     Rebecca Presto, MD  [facility] Pager [phone]  If 7PM-7AM, please contact night-coverage [email or web address] Password [facility] 04/15/2012, 5:45 PM   LOS: 1 day

## 2012-04-15 NOTE — Progress Notes (Signed)
INITIAL NUTRITION ASSESSMENT  DOCUMENTATION CODES Per approved criteria  -Severe malnutrition in the context of chronic illness   Pt meets criteria for SEVERE MALNUTRITION in the context of chronic illness as evidenced by estimated intake of </= 75% of her estimated needs, 15% weight loss x 6 months.  INTERVENTION: None at this time  NUTRITION DIAGNOSIS: Inadequate oral intake related to inability to eat as evidenced by NPO status.  Goal: Pt to meet >/= 90% of their estimated nutrition needs.  Monitor:  Plan of care, diet advancement  Reason for Assessment: Malnutrition Screening Tool  79 y.o. female  Admitting Dx: Abdominal Pain  ASSESSMENT: Pt with past medical history significant for B-cell lymphoma, she was admitted to [facility] long 10-2011 with abdominal pain from perforated sigmoid diverticulum with peritonitis and pelvic abscess requiring laparotomy which required sigmoid colectomy and end colostomy. Per pt since surgery she has had early satiety and unable to eat more than a few bites at one time. Pt hates ensure but recently had tried some ensure clear and tolerated a few sips of that. Pt reports that post op she had N/V off and on. Pt had been at a SNF until 3 weeks ago. Pt had been very debilitated, unable to walk until more recently.  Per MD note pt with possible gastroparesis, likely to have GI eval.  Pt remains NPO with NGT in place to suction. Per MD note pt with 600-700 ml this am.     Height: Ht Readings from Last 1 Encounters:  04/15/12 4\' 11"  (1.499 m)    Weight: Wt Readings from Last 1 Encounters:  04/15/12 99 lb 3.3 oz (45 kg)    Ideal Body Weight: 43.2 kg  % Ideal Body Weight: 104%  Wt Readings from Last 10 Encounters:  04/15/12 99 lb 3.3 oz (45 kg)  03/30/12 93 lb 6.4 oz (42.366 kg)  12/06/11 129 lb 13.6 oz (58.9 kg)  12/06/11 129 lb 13.6 oz (58.9 kg)  11/20/11 110 lb 12.8 oz (50.259 kg)  11/14/11 114 lb (51.71 kg)  10/31/11 119 lb 14.4 oz  (54.386 kg)  10/24/11 119 lb 4.8 oz (54.114 kg)  09/26/11 119 lb 6.4 oz (54.159 kg)  09/12/11 123 lb 14.4 oz (56.201 kg)    Usual Body Weight: 52.9 kg 10/2011  % Usual Body Weight: 85%  BMI:  Body mass index is 20.04 kg/(m^2). WNL  Estimated Nutritional Needs: Kcal: 1300-1500 Protein: 65-80 grams Fluid: >1.5 L/day  Skin: intact  Nutrition Focused Physical Exam: Subcutaneous Fat:  Orbital Region: WNL Upper Arm Region: WNL Thoracic and Lumbar Region: WNL  Muscle:  Temple Region: moderate wasting Clavicle Bone Region: mild wasting Clavicle and Acromion Bone Region: mild wasting Scapular Bone Region: WNL Dorsal Hand: moderate wasting Patellar Region: WNL Anterior Thigh Region: mild wasting Posterior Calf Region: mild wasting   Edema: not present    Diet Order: NPO  EDUCATION NEEDS: -No education needs identified at this time   Intake/Output Summary (Last 24 hours) at 04/15/12 1633 Last data filed at 04/15/12 1500  Gross per 24 hour  Intake 1373.33 ml  Output    780 ml  Net 593.33 ml    Last BM: 1/23 per ostomy   Labs:   Lab 04/15/12 0541 04/14/12 2340 04/14/12 1309  NA 137 -- 139  K 3.6 -- 2.5*  CL 104 -- 100  CO2 23 -- 23  BUN 6 -- 9  CREATININE 0.57 -- 0.55  CALCIUM 7.4* -- 8.3*  MG -- 1.3* --  PHOS -- -- --  GLUCOSE 77 -- 85    CBG (last 3)  No results found for this basename: GLUCAP:3 in the last 72 hours  Scheduled Meds:   . enoxaparin (LOVENOX) injection  30 mg Subcutaneous Q24H  . fentaNYL  12.5 mcg Transdermal Q72H  . olopatadine  1 drop Both Eyes Daily  . pantoprazole (PROTONIX) IV  40 mg Intravenous QHS  . sodium chloride  3 mL Intravenous Q12H    Continuous Infusions:   . dextrose 5 % and 0.45 % NaCl with KCl 20 mEq/L 100 mL/hr at 04/15/12 1129    Past Medical History  Diagnosis Date  . Cancer 07/08/11    follicular B cell lymphoma  . TIA (transient ischemic attack)   . Arthritis   . Hypertension   . Esophageal  abnormality     strictures  . Arthritis 09/04/2011  . Dehydration 11/20/2011  . Diarrhea 11/20/2011  . Complication of anesthesia   . PONV (postoperative nausea and vomiting)     in the 1980's  . GERD (gastroesophageal reflux disease)   . Esophageal stricture     x 2    Past Surgical History  Procedure Date  . Appendectomy   . Parotidectomy w/ neck dissection total   . Tonsillectomy   . Abdominal hysterectomy     left ovaries  . Laparotomy 11/22/2011    Procedure: EXPLORATORY LAPAROTOMY;  Surgeon: Velora Heckler, MD;  Location: [facility] ORS;  Service: General;  Laterality: N/A;  drainage of pelvic abscess  . Colostomy revision 11/22/2011    Procedure: COLON RESECTION SIGMOID;  Surgeon: Velora Heckler, MD;  Location: [facility] ORS;  Service: General;  Laterality: N/A;  . Colostomy 11/22/2011    Procedure: COLOSTOMY;  Surgeon: Velora Heckler, MD;  Location: [facility] ORS;  Service: General;  Laterality: N/A;  desending colostomy  . Esophagogastroduodenoscopy 01/06/2012    Procedure: ESOPHAGOGASTRODUODENOSCOPY (EGD);  Surgeon: Rachael Fee, MD;  Location: [facility] ENDOSCOPY;  Service: Endoscopy;  Laterality: N/A;    Kendell Bane RD, LDN, CNSC [phone] Pager [phone] After Hours Pager

## 2012-04-16 ENCOUNTER — Inpatient Hospital Stay (HOSPITAL_COMMUNITY): Payer: Medicare Other

## 2012-04-16 DIAGNOSIS — J9600 Acute respiratory failure, unspecified whether with hypoxia or hypercapnia: Secondary | ICD-10-CM | POA: Diagnosis not present

## 2012-04-16 DIAGNOSIS — R110 Nausea: Secondary | ICD-10-CM | POA: Diagnosis not present

## 2012-04-16 DIAGNOSIS — I5021 Acute systolic (congestive) heart failure: Secondary | ICD-10-CM | POA: Diagnosis not present

## 2012-04-16 DIAGNOSIS — I509 Heart failure, unspecified: Secondary | ICD-10-CM | POA: Diagnosis not present

## 2012-04-16 DIAGNOSIS — R109 Unspecified abdominal pain: Secondary | ICD-10-CM | POA: Diagnosis not present

## 2012-04-16 LAB — CBC
HCT: 29.8 % — ABNORMAL LOW (ref 36.0–46.0)
Hemoglobin: 10.2 g/dL — ABNORMAL LOW (ref 12.0–15.0)
MCHC: 34.2 g/dL (ref 30.0–36.0)
RBC: 3.29 MIL/uL — ABNORMAL LOW (ref 3.87–5.11)
WBC: 5.2 10*3/uL (ref 4.0–10.5)

## 2012-04-16 LAB — BASIC METABOLIC PANEL
BUN: 3 mg/dL — ABNORMAL LOW (ref 6–23)
CO2: 27 mEq/L (ref 19–32)
Chloride: 104 mEq/L (ref 96–112)
GFR calc non Af Amer: 87 mL/min — ABNORMAL LOW (ref >90)
Glucose, Bld: 102 mg/dL — ABNORMAL HIGH (ref 70–99)
Potassium: 3.2 mEq/L — ABNORMAL LOW (ref 3.5–5.1)
Sodium: 139 mEq/L (ref 135–145)

## 2012-04-16 MED ORDER — MAGNESIUM SULFATE 40 MG/ML IJ SOLN
2.0000 g | Freq: Once | INTRAMUSCULAR | Status: AC
  Administered 2012-04-16: 2 g via INTRAVENOUS
  Filled 2012-04-16: qty 50

## 2012-04-16 MED ORDER — POTASSIUM CHLORIDE 10 MEQ/100ML IV SOLN
10.0000 meq | INTRAVENOUS | Status: AC
  Administered 2012-04-16 (×4): 10 meq via INTRAVENOUS
  Filled 2012-04-16 (×4): qty 100

## 2012-04-16 NOTE — Clinical Documentation Improvement (Addendum)
MALNUTRITION DOCUMENTATION CLARIFICATION  THIS DOCUMENT IS NOT A PERMANENT PART OF THE MEDICAL RECORD  TO RESPOND TO THE THIS QUERY, FOLLOW THE INSTRUCTIONS BELOW:  1. If needed, update documentation for the patient's encounter via the notes activity.  2. Access this query again and click edit on the In [employer].  3. After updating, or not, click F2 to complete all highlighted (required) fields concerning your review. Select "additional documentation in the medical record" OR "no additional documentation provided".  4. Click Sign note button.  5. The deficiency will fall out of your In Basket *Please let us know if you are not able to complete this workflow by phone or e-mail (listed below).  Please update your documentation within the medical record to reflect your response to this query.                                                                                        04/16/12   Dear Dr. Izola Price / Associates,  In a better effort to capture your patient's severity of illness, reflect appropriate length of stay and utilization of resources, a review of the patient medical record has revealed the following indicators.    Based on your clinical judgment, please clarify and document in a progress note and/or discharge summary the clinical condition associated with the following supporting information:  In responding to this query please exercise your independent judgment.  The fact that a query is asked, does not imply that any particular answer is desired or expected.Please clarify, if possible,  initial nutritional assessment on 04/15/12 could meet one of the following :  Possible Clinical Conditions?   _______Severe Malnutrition    _______Severe Protein Calorie Malnutrition _______Other Condition________________ _______Cannot clinically determine     Supporting Information:  Risk Factors:Abdominal Pain, history significant for B-cell lymphoma, colostomy. had been at a  SNF until 3 weeks ago. Pt had been very debilitated, unable to walk until more recently.     Signs & Symptoms:Muscle:  Temple Region: moderate wasting Clavicle Bone Region: mild wasting Clavicle and Acromion Bone Region: mild wasting Scapular Bone Region: WNL Dorsal Hand: moderate wasting Patellar Region: WNL Anterior Thigh Region: mild wasting Posterior Calf Region: mild wasting  -Ht: 4 ft 11in      Wt: 99 lb  -BMI: 20  -Albumin level: 2.7  -Total Protein:4.6  -Calcium level: 04/15/12  results  7.4  Treatments: -Enteral Feeding:  -Medications: Protonix IV QHS  -Nutrition Consult:  Pt meets criteria for SEVERE MALNUTRITION in the context of chronic illness as evidenced by estimated intake of </= 75% of her estimated needs, 15% weight loss x 6 months      You may use possible, probable, or suspect with inpatient documentation. possible, probable, suspected diagnoses MUST be documented at the time of discharge  Reviewed: documented in progress note 1/24 Thank you  Thank You,  Andy Gauss  Clinical Documentation Specialist:  Pager [phone]   /# [phone]  Health Information Management [facility]

## 2012-04-16 NOTE — Progress Notes (Signed)
Subjective: She seems to feel better, still a lot of drainage from NG.  She was down for xray on my first visit.  Back now, says they just changed her ostomy bag because it was to full.  Objective: Vital signs in last 24 hours: Temp:  [98.1 F (36.7 C)-99 F (37.2 C)] 98.1 F (36.7 C) (01/24 0500) Pulse Rate:  [98-103] 103  (01/24 0500) Resp:  [18-20] 20  (01/24 0500) BP: (117-123)/(63-72) 123/72 mmHg (01/24 0500) SpO2:  [96 %] 96 % (01/24 0500) Weight:  [94 lb 2.2 oz (42.7 kg)] 94 lb 2.2 oz (42.7 kg) (01/24 0500) Last BM Date: 04/15/12  1275 ml from NG recorded yesterday!! Afebrile, TM 99, K+3.2, CBC is stable. Film shows no evidence of bowel obstruction, no free air. Intake/Output from previous day: 01/23 0701 - 01/24 0700 In: 2300 [I.V.:2300] Out: 2555 [Urine:1280; Emesis/NG output:1275] Intake/Output this shift:    General appearance: alert, cooperative, no distress and she feels very warm. GI: soft, non-tender; bowel sounds normal; no masses,  no organomegaly  Lab Results:   Basename 04/16/12 0505 04/15/12 0541  WBC 5.2 6.4  HGB 10.2* 10.0*  HCT 29.8* 29.6*  PLT 254 264    BMET  Basename 04/16/12 0505 04/15/12 0541  NA 139 137  K 3.2* 3.6  CL 104 104  CO2 27 23  GLUCOSE 102* 77  BUN 3* 6  CREATININE 0.56 0.57  CALCIUM 7.7* 7.4*   PT/INR No results found for this basename: LABPROT:2,INR:2 in the last 72 hours   Lab 04/15/12 0541 04/14/12 1309  AST 18 24  ALT 9 11  ALKPHOS 42 49  BILITOT 0.4 0.6  PROT 4.1* 4.6*  ALBUMIN 2.2* 2.7*     Lipase     Component Value Date/Time   LIPASE 21 04/14/2012 1309     Studies/Results: Ct Abdomen Pelvis W Contrast  04/14/2012  *RADIOLOGY REPORT*  Clinical Data: Abdominal pain.  History of lymphoma and colostomy.  CT ABDOMEN AND PELVIS WITH CONTRAST  Technique:  Multidetector CT imaging of the abdomen and pelvis was performed following the standard protocol during bolus administration of intravenous contrast.   Contrast: 80mL OMNIPAQUE IOHEXOL 300 MG/ML  SOLN the  Comparison: CT abdomen and pelvis 03/04/2012.  Findings: Small bilateral pleural effusions, larger on the left, are unchanged.  There is some mild basilar atelectasis.  Lung bases otherwise clear.  Tiny low attenuating lesion in the periphery of the right lobe of the liver on image 13 is unchanged.  Second low attenuating lesion in the dome of the liver on image 8 is also stable in appearance. No new liver lesion is identified.  The gallbladder, adrenal glands, spleen and pancreas are unremarkable.  Mild fullness of the right renal collecting system is new since the prior examination but no obstructing lesion is identified.  The kidneys are otherwise unremarkable.  Left lower quadrant colostomy is identified.  The patient has a Hartmann's pouch.  The stomach and small bowel appear normal.  No lymphadenopathy is identified.  There is no fluid collection.  No focal bony abnormality.  IMPRESSION:  1.  Mild fullness of the right renal collecting system is new since the comparison studies but no obstructing lesion is identified. 2.  Wall thickening of the descending colon seen on the most recent CT scan is not appreciated on today's study compatible with resolved colitis. 3.  No change in small bilateral pleural effusions, larger on the left.   Original Report Authenticated By: Holley Dexter,  M.D.     Medications:    . enoxaparin (LOVENOX) injection  30 mg Subcutaneous Q24H  . fentaNYL  12.5 mcg Transdermal Q72H  . olopatadine  1 drop Both Eyes Daily  . pantoprazole (PROTONIX) IV  40 mg Intravenous QHS  . sodium chloride  3 mL Intravenous Q12H    Assessment/Plan Abdominal pain, nausea and vomiting; s/p perforated sigmoid diverticulitis with colectomy/colostomy, prolonged course with tracheostomy - 10/2011 - T. [name]  CT scan shows no sign of obstruction   KUB today (04/16/2012) look okay. B cell lymphoma  Hx of TIA  Hypertension  Hx of Esophageal  strictures  Gerd    Plan:  I would try to clamp NG and see how she does.  Nothing on exam, or xray to indicated she has an obstruction.  She has stool coming thru her ostomy.  If she continues to have nausea and high NG output you may want to consider Gi consult for this.   LOS: 2 days    JENNINGS,WILLARD 04/16/2012  Agree with above. Husband in room.  Dr. Denna Haggard in room. Colostomy working.  No abdominal pain. Will clamp NGT.  If tolerated, may remove it later today. Cause of her abdominal pain is unknown, but she is getting better.  Ovidio Kin, MD, [facility] Pager: [phone] Office phone:  [phone]

## 2012-04-16 NOTE — Evaluation (Signed)
Physical Therapy Evaluation Patient Details Name: [name] MRN: [id number] DOB: 29-Jul-1931 Today's Date: 04/16/2012 Time: 1135-1205 PT Time Calculation (min): 30 min  PT Assessment / Plan / Recommendation Clinical Impression  79 yo female admitted with abdominal pain. On eval, pt was Supervision-modified independent with mobility. Able to ambulate ~350 feet with RW. Do not feel pt has any acute PT needs at this time. Recommend daily mobility with RW with nursing supervision. PT will sign off. Encouraged pt to ambulate several times/day as tolerated.     PT Assessment  Patent does not need any further PT services    Follow Up Recommendations  No PT follow up;Supervision for mobility/OOB    Does the patient have the potential to tolerate intense rehabilitation      Barriers to Discharge        Equipment Recommendations  None recommended by PT    Recommendations for Other Services     Frequency      Precautions / Restrictions Precautions Precautions: None Restrictions Weight Bearing Restrictions: No   Pertinent Vitals/Pain No c/o pain      Mobility  Bed Mobility Bed Mobility: Supine to Sit;Sit to Supine Supine to Sit: 6: Modified independent (Device/Increase time) Sit to Supine: 6: Modified independent (Device/Increase time) Details for Bed Mobility Assistance: Increased time.  Transfers Transfers: Stand to Sit;Sit to Stand Sit to Stand: From bed;5: Supervision Stand to Sit: 6: Modified independent (Device/Increase time) Ambulation/Gait Ambulation/Gait Assistance: 5: Supervision Ambulation Distance (Feet): 350 Feet Assistive device: Rolling walker    Shoulder Instructions     Exercises General Exercises - Lower Extremity Ankle Circles/Pumps: AROM;Both;10 reps;Seated Long Arc Quad: AROM;Both;10 reps;Seated Hip ABduction/ADduction: AROM;10 reps;Seated   PT Diagnosis:    PT Problem List:   PT Treatment Interventions:     PT Goals    Visit  Information  Last PT Received On: 04/16/12 Assistance Needed: +1    Subjective Data  Subjective: "I've been in bed for 3 days now" Patient Stated Goal: Home   Prior Functioning  Home Living Lives With: Spouse Home Adaptive Equipment: Walker - rolling Prior Function Level of Independence: Independent with assistive device(s) Communication Communication: No difficulties    Cognition  Overall Cognitive Status: Appears within functional limits for tasks assessed/performed Orientation Level: Appears intact for tasks assessed Behavior During Session: [facility] for tasks performed    Extremity/Trunk Assessment Right Lower Extremity Assessment RLE ROM/Strength/Tone: [facility] for tasks assessed Left Lower Extremity Assessment LLE ROM/Strength/Tone: [facility] for tasks assessed   Balance    End of Session PT - End of Session Activity Tolerance: Patient tolerated treatment well Patient left: in chair;with call bell/phone within reach  GP     [name] Alert [facility] 04/16/2012, 12:19 PM [phone]

## 2012-04-16 NOTE — Progress Notes (Addendum)
Patient ID: [name], female   DOB: 02-Mar-1932, 79 y.o.   MRN: [id number]  [facility] PROGRESS NOTE  [name] WUJ:[id number] DOB: 1931-06-27 DOA: 04/14/2012 PCP: Abner Greenspan, MD  Brief narrative:  Pt is 79 y.o. female with past medical history significant for B-cell lymphoma, she was admitted to [facility] long 10-2011 with abdominal pain from perforated sigmoid diverticulum with peritonitis and pelvic abscess requiring laparotomy and sigmoid colectomy, and colostomy. Prolonged stay on ventilator requiring tracheostomy. She went to rehab and then home. She presented to [facility] ED with main concern of generalized abdominal pain, intermittent and sharp in nature and 10/10 in severity when present, radiating to lower abdominal quadrants, aggravated by food intake and with no specific alleviating factors, associated with 3 episodes of non bloody, bilious vomiting.   In ED, pt has received morphine and NGT placed. [facility] asked to admit for further evaluation. Surgery consulted.   Active Problems:  Abdominal pain, nausea and vomiting  - per surgery this is unlikely to be caused by an obstruction, ? Gastroparesis  - Abdominal XRAY unremarkable - clamp NGT today for several hours and attempt clear liquids - supportive care and supplement electrolytes as needed  Hypokalemia  - secondary to vomiting and hypomagnesemia  - supplement today with KCL and Mg 2 gm IV - BMP in AM, Mg in AM Anemia of chronic disease  - pt with lymphoma  - Hg still at baseline  - CBC In AM  Malnutrition, severe - in the setting of chronic illness, lymphoma, recent hospitalization with complications and discharged to SNF - will plan on advancing diet once pt able to tolerate  Consultants:  Surgery  Procedures/Studies:  Abdomina XRAY 01//24/2013 No evidence of bowel obstruction or acute finding. Ct Abdomen Pelvis W Contrast 04/14/2012  1. Mild fullness of the right renal collecting system is new since the  comparison studies but no obstructing lesion is identified.  2. Wall thickening of the descending colon seen on the most recent CT scan is not appreciated on today's study compatible with resolved colitis.  3. No change in small bilateral pleural effusions, larger on the left. Antibiotics:  None  Code Status: Full  Family Communication: Pt at bedside  Disposition Plan: Home when medically stable    HPI/Subjective: No events overnight.   Objective: Filed Vitals:   04/15/12 0015 04/15/12 0600 04/15/12 1345 04/16/12 0500  BP: 129/69 114/62 117/63 123/72  Pulse: 100 98 98 103  Temp: 98 F (36.7 C) 99.2 F (37.3 C) 99 F (37.2 C) 98.1 F (36.7 C)  TempSrc: Oral Axillary Oral Oral  Resp: 16 16 18 20   Height: 4\' 11"  (1.499 m)     Weight: 43.5 kg (95 lb 14.4 oz) 45 kg (99 lb 3.3 oz)  42.7 kg (94 lb 2.2 oz)  SpO2: 100% 95% 96% 96%    Intake/Output Summary (Last 24 hours) at 04/16/12 1043 Last data filed at 04/16/12 0700  Gross per 24 hour  Intake   2300 ml  Output   2555 ml  Net   -255 ml    Exam:   General:  Pt is alert, follows commands appropriately, not in acute distress  Cardiovascular: Regular rhythm, tachycardic, S1/S2, no murmurs, no rubs, no gallops  Respiratory: Clear to auscultation bilaterally, no wheezing, no crackles, no rhonchi  Abdomen: Soft, slightly tender in epigastric area, non distended, bowel sounds present, no guarding  Extremities: No edema, pulses DP and PT palpable bilaterally  Neuro: Grossly nonfocal  Data  Reviewed: Basic Metabolic Panel:  Lab 04/16/12 1610 04/15/12 0541 04/14/12 2340 04/14/12 1309  NA 139 137 -- 139  K 3.2* 3.6 -- 2.5*  CL 104 104 -- 100  CO2 27 23 -- 23  GLUCOSE 102* 77 -- 85  BUN 3* 6 -- 9  CREATININE 0.56 0.57 -- 0.55  CALCIUM 7.7* 7.4* -- 8.3*  MG -- -- 1.3* --  PHOS -- -- -- --   Liver Function Tests:  Lab 04/15/12 0541 04/14/12 1309  AST 18 24  ALT 9 11  ALKPHOS 42 49  BILITOT 0.4 0.6  PROT 4.1* 4.6*   ALBUMIN 2.2* 2.7*    Lab 04/14/12 1309  LIPASE 21  AMYLASE --   CBC:  Lab 04/16/12 0505 04/15/12 0541 04/14/12 1309  WBC 5.2 6.4 5.4  NEUTROABS -- -- 3.8  HGB 10.2* 10.0* 11.2*  HCT 29.8* 29.6* 32.3*  MCV 90.6 90.5 88.5  PLT 254 264 268    Scheduled Meds:   . enoxaparin  injection  30 mg Subcutaneous Q24H  . fentaNYL  12.5 mcg Transdermal Q72H  . olopatadine  1 drop Both Eyes Daily  . pantoprazole IV  40 mg Intravenous QHS  . sodium chloride  3 mL Intravenous Q12H   Continuous Infusions:   . dextrose 5 % and 0.45 % NaCl with KCl 20 mEq/L 100 mL/hr at 04/16/12 0947     Debbora Presto, MD  [facility] Pager [phone]  If 7PM-7AM, please contact night-coverage [email or web address] Password [facility] 04/16/2012, 10:43 AM   LOS: 2 days

## 2012-04-17 DIAGNOSIS — I509 Heart failure, unspecified: Secondary | ICD-10-CM | POA: Diagnosis not present

## 2012-04-17 DIAGNOSIS — R109 Unspecified abdominal pain: Secondary | ICD-10-CM | POA: Diagnosis not present

## 2012-04-17 DIAGNOSIS — J9600 Acute respiratory failure, unspecified whether with hypoxia or hypercapnia: Secondary | ICD-10-CM | POA: Diagnosis not present

## 2012-04-17 DIAGNOSIS — R110 Nausea: Secondary | ICD-10-CM | POA: Diagnosis not present

## 2012-04-17 DIAGNOSIS — I5021 Acute systolic (congestive) heart failure: Secondary | ICD-10-CM | POA: Diagnosis not present

## 2012-04-17 LAB — CBC
HCT: 30.3 % — ABNORMAL LOW (ref 36.0–46.0)
Hemoglobin: 10.3 g/dL — ABNORMAL LOW (ref 12.0–15.0)
MCH: 30.9 pg (ref 26.0–34.0)
RBC: 3.33 MIL/uL — ABNORMAL LOW (ref 3.87–5.11)

## 2012-04-17 LAB — BASIC METABOLIC PANEL
BUN: <3 mg/dL — ABNORMAL LOW (ref 6–23)
CO2: 25 mEq/L (ref 19–32)
Calcium: 7.9 mg/dL — ABNORMAL LOW (ref 8.4–10.5)
Glucose, Bld: 108 mg/dL — ABNORMAL HIGH (ref 70–99)
Sodium: 134 mEq/L — ABNORMAL LOW (ref 135–145)

## 2012-04-17 NOTE — Progress Notes (Signed)
I find her to be chipper and in good spirits.  She denies n/v, and has a quantity of flatus in her ostomy bag with a sm. Amt. Of brown stool.  Her N.G. Tube remains clamped.  Her color is good, and tells me in general she is feeling well.

## 2012-04-17 NOTE — Progress Notes (Signed)
Patient ID: [name], female   DOB: 10-23-1931, 79 y.o.   MRN: [id number] [facility] PROGRESS NOTE  JULLIAN PREVITI NWG:[id number] DOB: 1931-12-21 DOA: 04/14/2012 PCP: Abner Greenspan, MD  Brief narrative:  Pt is 79 y.o. female with past medical history significant for B-cell lymphoma, she was admitted to [facility] long 10-2011 with abdominal pain from perforated sigmoid diverticulum with peritonitis and pelvic abscess requiring laparotomy and sigmoid colectomy, and colostomy. Prolonged stay on ventilator requiring tracheostomy. She went to rehab and then home. She presented to [facility] ED with main concern of generalized abdominal pain, intermittent and sharp in nature and 10/10 in severity when present, radiating to lower abdominal quadrants, aggravated by food intake and with no specific alleviating factors, associated with 3 episodes of non bloody, bilious vomiting.  In ED, pt has received morphine and NGT placed. [facility] asked to admit for further evaluation.  Active Problems:  Abdominal pain, nausea and vomiting  - unclear etiology but now resolved, no N/V over 24 hour period  - Abdominal XRAY 01/24 unremarkable  - per surgery, NGT out today and advance diet to clear liquid  - supportive care Hypokalemia  - secondary to vomiting and hypomagnesemia  - supplemented and within normal limits this AM - BMP in AM, Mg in AM  Anemia of chronic disease  - pt with lymphoma  - Hg still at baseline  - CBC In AM  Malnutrition, severe  - in the setting of chronic illness, lymphoma, recent hospitalization with complications and discharged to SNF  - will plan on advancing diet to clear liquid today   Consultants:  Surgery   Procedures/Studies:   Abdomina XRAY 01//24/2013 No evidence of bowel obstruction or acute finding.   Ct Abdomen Pelvis W Contrast 04/14/2012  1. Mild fullness of the right renal collecting system is new since the comparison studies but no obstructing lesion is identified.    2. Wall thickening of the descending colon seen on the most recent CT scan is not appreciated on today's study compatible with resolved colitis.  3. No change in small bilateral pleural effusions, larger on the left.   Antibiotics:  None  Code Status: Full  Family Communication: Pt at bedside  Disposition Plan: Home when medically stable   HPI/Subjective: No events overnight.   Objective: Filed Vitals:   04/16/12 0500 04/16/12 1500 04/16/12 2120 04/17/12 0611  BP: 123/72 124/69 125/73 129/77  Pulse: 103 94 104 96  Temp: 98.1 F (36.7 C) 98.4 F (36.9 C) 98.2 F (36.8 C) 97.9 F (36.6 C)  TempSrc: Oral Oral Oral Oral  Resp: 20 18 18 16   Height:      Weight: 42.7 kg (94 lb 2.2 oz)   41.5 kg (91 lb 7.9 oz)  SpO2: 96% 97% 97% 98%    Intake/Output Summary (Last 24 hours) at 04/17/12 1154 Last data filed at 04/17/12 1016  Gross per 24 hour  Intake    800 ml  Output   1300 ml  Net   -500 ml    Exam:   General:  Pt is alert, follows commands appropriately, not in acute distress  Cardiovascular: Regular rhythm, tachycardic, S1/S2, no murmurs, no rubs, no gallops  Respiratory: Clear to auscultation bilaterally, no wheezing, no crackles, no rhonchi  Abdomen: Soft, non tender, non distended, bowel sounds present, no guarding, little stool in ostomy bag   Extremities: No edema, pulses DP and PT palpable bilaterally  Neuro: Grossly nonfocal  Data Reviewed: Basic Metabolic Panel:  Lab  04/17/12 0425 04/16/12 0505 04/15/12 0541 04/14/12 2340 04/14/12 1309  NA 134* 139 137 -- 139  K 3.8 3.2* 3.6 -- 2.5*  CL 105 104 104 -- 100  CO2 25 27 23  -- 23  GLUCOSE 108* 102* 77 -- 85  BUN <3* 3* 6 -- 9  CREATININE 0.56 0.56 0.57 -- 0.55  CALCIUM 7.9* 7.7* 7.4* -- 8.3*  MG 1.8 -- -- 1.3* --  PHOS -- -- -- -- --   Liver Function Tests:  Lab 04/15/12 0541 04/14/12 1309  AST 18 24  ALT 9 11  ALKPHOS 42 49  BILITOT 0.4 0.6  PROT 4.1* 4.6*  ALBUMIN 2.2* 2.7*    Lab  04/14/12 1309  LIPASE 21  AMYLASE --   No results found for this basename: AMMONIA:5 in the last 168 hours CBC:  Lab 04/17/12 0425 04/16/12 0505 04/15/12 0541 04/14/12 1309  WBC 5.4 5.2 6.4 5.4  NEUTROABS -- -- -- 3.8  HGB 10.3* 10.2* 10.0* 11.2*  HCT 30.3* 29.8* 29.6* 32.3*  MCV 91.0 90.6 90.5 88.5  PLT 254 254 264 268   Scheduled Meds:   . enoxaparin (LOVENOX) injection  30 mg Subcutaneous Q24H  . fentaNYL  12.5 mcg Transdermal Q72H  . olopatadine  1 drop Both Eyes Daily  . pantoprazole (PROTONIX) IV  40 mg Intravenous QHS  . sodium chloride  3 mL Intravenous Q12H   Continuous Infusions:   . dextrose 5 % and 0.45 % NaCl with KCl 20 mEq/L 100 mL/hr at 04/17/12 6213     Debbora Presto, MD  [facility] Pager [phone]  If 7PM-7AM, please contact night-coverage [email or web address] Password [facility] 04/17/2012, 11:54 AM   LOS: 3 days

## 2012-04-17 NOTE — Progress Notes (Signed)
General Surgery Note  LOS: 3 days  Room - 1518  Assessment/Plan: 1.  Acute Abdominal pain   Etiology unclear.  Clearly better.  To remove NGT and start clear liquids.  2.  S/P colectomy/colostomy - 10/2011 - T. [name] 3.  History of B Cell Lymphoma  4.  History of TIA 5.  Hypertension  6.  History of GERD with esophageal stricture 7.  DVT prophylaxis - on lovenox  Subjective:  Passed a lot of gas in colostomy bag, but little stool.  No nausea.  Husband is room  Objective:   Filed Vitals:   04/17/12 0611  BP: 129/77  Pulse: 96  Temp: 97.9 F (36.6 C)  Resp: 16     Intake/Output from previous day:  01/24 0701 - 01/25 0700 In: 800 [I.V.:800] Out: 1250 [Urine:1250]  Intake/Output this shift:  Total I/O In: -  Out: 200 [Urine:200]   Physical Exam:   General: Older WF who is alert and oriented.    HEENT: Normal. Pupils equal. .   Lungs: Clear   Abdomen: Soft, good BS.  Ostomy okay.   Lab Results:    Basename 04/17/12 0425 04/16/12 0505  WBC 5.4 5.2  HGB 10.3* 10.2*  HCT 30.3* 29.8*  PLT 254 254    BMET   Basename 04/17/12 0425 04/16/12 0505  NA 134* 139  K 3.8 3.2*  CL 105 104  CO2 25 27  GLUCOSE 108* 102*  BUN <3* 3*  CREATININE 0.56 0.56  CALCIUM 7.9* 7.7*    PT/INR  No results found for this basename: LABPROT:2,INR:2 in the last 72 hours  ABG  No results found for this basename: PHART:2,PCO2:2,PO2:2,HCO3:2 in the last 72 hours   Studies/Results:  Dg Abd 2 Views  04/16/2012  *RADIOLOGY REPORT*  Clinical Data: Follow up of small bowel obstruction.  Improved abdominal pain since 2 days ago.  ABDOMEN - 2 VIEW  Comparison: 04/14/2012 CT  Findings: Supine and right-side up decubitus views.  The right- sided decubitus view demonstrates no free intraperitoneal air.  No significant air fluid levels.  The supine view demonstrates gas within the upper abdomen is favored to be within normal caliber stomach.  No bowel distention.  A nasogastric tube terminates  at the body of the stomach.  There is a left abdominal colostomy. Low pelvis partially excluded.  No pneumatosis.  IMPRESSION: No evidence of bowel obstruction or acute finding.   Original Report Authenticated By: Jeronimo Greaves, M.D.      Anti-infectives:   Anti-infectives    None      Ovidio Kin, MD, FACS Pager: [phone],   [facility] Surgery Office: [phone] 04/17/2012

## 2012-04-18 DIAGNOSIS — R109 Unspecified abdominal pain: Secondary | ICD-10-CM | POA: Diagnosis not present

## 2012-04-18 DIAGNOSIS — R110 Nausea: Secondary | ICD-10-CM | POA: Diagnosis not present

## 2012-04-18 DIAGNOSIS — I5021 Acute systolic (congestive) heart failure: Secondary | ICD-10-CM | POA: Diagnosis not present

## 2012-04-18 DIAGNOSIS — J9600 Acute respiratory failure, unspecified whether with hypoxia or hypercapnia: Secondary | ICD-10-CM | POA: Diagnosis not present

## 2012-04-18 DIAGNOSIS — I509 Heart failure, unspecified: Secondary | ICD-10-CM | POA: Diagnosis not present

## 2012-04-18 LAB — CBC
HCT: 29.5 % — ABNORMAL LOW (ref 36.0–46.0)
MCH: 31.1 pg (ref 26.0–34.0)
MCV: 90.8 fL (ref 78.0–100.0)
RBC: 3.25 MIL/uL — ABNORMAL LOW (ref 3.87–5.11)
RDW: 16.5 % — ABNORMAL HIGH (ref 11.5–15.5)
WBC: 3.5 10*3/uL — ABNORMAL LOW (ref 4.0–10.5)

## 2012-04-18 LAB — BASIC METABOLIC PANEL
BUN: <3 mg/dL — ABNORMAL LOW (ref 6–23)
CO2: 23 mEq/L (ref 19–32)
Chloride: 105 mEq/L (ref 96–112)
Creatinine, Ser: 0.56 mg/dL (ref 0.50–1.10)

## 2012-04-18 NOTE — Progress Notes (Signed)
General Surgery Note  LOS: 4 days  Room - 1518  Assessment/Plan: 1.  Acute Abdominal pain   Etiology unclear.  Doing well.  Tolerated reg diet.  To advance to reg diet.  Probably home tomorrow.  2.  S/P colectomy/colostomy - 10/2011 - T. [name] 3.  History of B Cell Lymphoma  4.  History of TIA 5.  Hypertension  6.  History of GERD with esophageal stricture 7.  DVT prophylaxis - on lovenox  Subjective:  Feels good.  Stool in colostomy bag.  No pain.  Objective:   Filed Vitals:   04/18/12 0628  BP: 124/70  Pulse: 93  Temp: 98 F (36.7 C)  Resp: 18     Intake/Output from previous day:  01/25 0701 - 01/26 0700 In: -  Out: 800 [Urine:800]  Intake/Output this shift:  Total I/O In: -  Out: 200 [Urine:200]   Physical Exam:   General: Thin older WF who is alert and oriented.    HEENT: Normal. Pupils equal. .   Lungs: Clear   Abdomen: Soft, good BS.  Ostomy with stool in bag.   Lab Results:     Basename 04/18/12 0640 04/17/12 0425  WBC 3.5* 5.4  HGB 10.1* 10.3*  HCT 29.5* 30.3*  PLT 274 254    BMET    Basename 04/17/12 0425 04/16/12 0505  NA 134* 139  K 3.8 3.2*  CL 105 104  CO2 25 27  GLUCOSE 108* 102*  BUN <3* 3*  CREATININE 0.56 0.56  CALCIUM 7.9* 7.7*    PT/INR  No results found for this basename: LABPROT:2,INR:2 in the last 72 hours  ABG  No results found for this basename: PHART:2,PCO2:2,PO2:2,HCO3:2 in the last 72 hours   Studies/Results:  Dg Abd 2 Views  04/16/2012  *RADIOLOGY REPORT*  Clinical Data: Follow up of small bowel obstruction.  Improved abdominal pain since 2 days ago.  ABDOMEN - 2 VIEW  Comparison: 04/14/2012 CT  Findings: Supine and right-side up decubitus views.  The right- sided decubitus view demonstrates no free intraperitoneal air.  No significant air fluid levels.  The supine view demonstrates gas within the upper abdomen is favored to be within normal caliber stomach.  No bowel distention.  A nasogastric tube terminates at  the body of the stomach.  There is a left abdominal colostomy. Low pelvis partially excluded.  No pneumatosis.  IMPRESSION: No evidence of bowel obstruction or acute finding.   Original Report Authenticated By: Jeronimo Greaves, M.D.      Anti-infectives:   Anti-infectives    None      Ovidio Kin, MD, FACS Pager: [phone],   [facility] Surgery Office: [phone] 04/18/2012

## 2012-04-18 NOTE — Progress Notes (Signed)
Patient ID: [name], female   DOB: 1931-04-09, 79 y.o.   MRN: [id number] [facility] PROGRESS NOTE  [name] WUJ:[id number] DOB: Oct 10, 1931 DOA: 04/14/2012 PCP: Abner Greenspan, MD Brief narrative:  Pt is 79 y.o. female with past medical history significant for B-cell lymphoma, she was admitted to [facility] long 10-2011 with abdominal pain from perforated sigmoid diverticulum with peritonitis and pelvic abscess requiring laparotomy and sigmoid colectomy, and colostomy. Prolonged stay on ventilator requiring tracheostomy. She went to rehab and then home. She presented to [facility] ED with main concern of generalized abdominal pain, intermittent and sharp in nature and 10/10 in severity when present, radiating to lower abdominal quadrants, aggravated by food intake and with no specific alleviating factors, associated with 3 episodes of non bloody, bilious vomiting.  In ED, pt has received morphine and NGT placed. [facility] asked to admit for further evaluation.   Active Problems:  Abdominal pain, nausea and vomiting  - unclear etiology but now resolved, no N/V over 48 hour period  - Abdominal XRAY 01/24 unremarkable  - advance diet as pt tolerating Hypokalemia  - secondary to vomiting and hypomagnesemia  - supplemented and within normal limits this AM  - BMP in AM Anemia of chronic disease  - pt with lymphoma  - Hg still at baseline  - CBC In AM  Malnutrition, severe  - in the setting of chronic illness, lymphoma, recent hospitalization with complications and discharged to SNF  - will plan on advancing diet to clear liquid today   Consultants:  Surgery  Procedures/Studies:  Abdomina XRAY 01//24/2013 No evidence of bowel obstruction or acute finding.  Ct Abdomen Pelvis W Contrast 04/14/2012  Mild fullness of the right renal collecting system is new since the comparison studies but no obstructing lesion is identified.  Wall thickening of the descending colon seen on the most recent CT scan  is not appreciated on today's study compatible with resolved colitis.  No change in small bilateral pleural effusions, larger on the left.  Antibiotics:  None  Code Status: Full  Family Communication: Pt at bedside  Disposition Plan: Home when medically stable   HPI/Subjective: No events overnight.   Objective: Filed Vitals:   04/17/12 0611 04/17/12 1506 04/17/12 2209 04/18/12 0628  BP: 129/77 111/73 111/63 124/70  Pulse: 96 101 88 93  Temp: 97.9 F (36.6 C) 98.2 F (36.8 C) 98.8 F (37.1 C) 98 F (36.7 C)  TempSrc: Oral Oral Oral Oral  Resp: 16 18 18 18   Height:      Weight: 41.5 kg (91 lb 7.9 oz)     SpO2: 98% 99% 97% 97%    Intake/Output Summary (Last 24 hours) at 04/18/12 1044 Last data filed at 04/18/12 0814  Gross per 24 hour  Intake      0 ml  Output    650 ml  Net   -650 ml    Exam:   General:  Pt is alert, follows commands appropriately, not in acute distress  Cardiovascular: Regular rate and rhythm, S1/S2, no murmurs, no rubs, no gallops  Respiratory: Clear to auscultation bilaterally, no wheezing, no crackles, no rhonchi  Abdomen: Soft, non tender, non distended, bowel sounds present, no guarding, stool in ostomy bag   Extremities: No edema, pulses DP and PT palpable bilaterally  Neuro: Grossly nonfocal  Data Reviewed: Basic Metabolic Panel:  Lab 04/18/12 9562 04/17/12 0425 04/16/12 0505 04/15/12 0541 04/14/12 2340 04/14/12 1309  NA 135 134* 139 137 -- 139  K 3.7  3.8 3.2* 3.6 -- 2.5*  CL 105 105 104 104 -- 100  CO2 23 25 27 23  -- 23  GLUCOSE 92 108* 102* 77 -- 85  BUN <3* <3* 3* 6 -- 9  CREATININE 0.56 0.56 0.56 0.57 -- 0.55  CALCIUM 8.0* 7.9* 7.7* 7.4* -- 8.3*  MG 1.6 1.8 -- -- 1.3* --  PHOS -- -- -- -- -- --   Liver Function Tests:  Lab 04/15/12 0541 04/14/12 1309  AST 18 24  ALT 9 11  ALKPHOS 42 49  BILITOT 0.4 0.6  PROT 4.1* 4.6*  ALBUMIN 2.2* 2.7*    Lab 04/14/12 1309  LIPASE 21  AMYLASE --   No results found for this  basename: AMMONIA:5 in the last 168 hours CBC:  Lab 04/18/12 0640 04/17/12 0425 04/16/12 0505 04/15/12 0541 04/14/12 1309  WBC 3.5* 5.4 5.2 6.4 5.4  NEUTROABS -- -- -- -- 3.8  HGB 10.1* 10.3* 10.2* 10.0* 11.2*  HCT 29.5* 30.3* 29.8* 29.6* 32.3*  MCV 90.8 91.0 90.6 90.5 88.5  PLT 274 254 254 264 268   Scheduled Meds:   . enoxaparin (LOVENOX) injection  30 mg Subcutaneous Q24H  . fentaNYL  12.5 mcg Transdermal Q72H  . olopatadine  1 drop Both Eyes Daily  . pantoprazole (PROTONIX) IV  40 mg Intravenous QHS  . sodium chloride  3 mL Intravenous Q12H   Continuous Infusions:   . dextrose 5 % and 0.45 % NaCl with KCl 20 mEq/L 50 mL/hr at 04/18/12 0953     Debbora Presto, MD  [facility] Pager [phone]  If 7PM-7AM, please contact night-coverage [email or web address] Password [facility] 04/18/2012, 10:44 AM   LOS: 4 days

## 2012-04-19 DIAGNOSIS — I509 Heart failure, unspecified: Secondary | ICD-10-CM | POA: Diagnosis not present

## 2012-04-19 DIAGNOSIS — R109 Unspecified abdominal pain: Secondary | ICD-10-CM | POA: Diagnosis not present

## 2012-04-19 DIAGNOSIS — I5021 Acute systolic (congestive) heart failure: Secondary | ICD-10-CM | POA: Diagnosis not present

## 2012-04-19 DIAGNOSIS — J9600 Acute respiratory failure, unspecified whether with hypoxia or hypercapnia: Secondary | ICD-10-CM | POA: Diagnosis not present

## 2012-04-19 LAB — CBC
HCT: 30.4 % — ABNORMAL LOW (ref 36.0–46.0)
Hemoglobin: 10.6 g/dL — ABNORMAL LOW (ref 12.0–15.0)
MCH: 31.3 pg (ref 26.0–34.0)
MCHC: 34.9 g/dL (ref 30.0–36.0)
MCV: 89.7 fL (ref 78.0–100.0)

## 2012-04-19 LAB — BASIC METABOLIC PANEL
BUN: <3 mg/dL — ABNORMAL LOW (ref 6–23)
Calcium: 8.1 mg/dL — ABNORMAL LOW (ref 8.4–10.5)
Creatinine, Ser: 0.56 mg/dL (ref 0.50–1.10)
GFR calc non Af Amer: 87 mL/min — ABNORMAL LOW (ref >90)
Glucose, Bld: 86 mg/dL (ref 70–99)

## 2012-04-19 MED ORDER — HYDROCODONE-ACETAMINOPHEN 5-325 MG PO TABS: 1.0000 | ORAL_TABLET | ORAL | Status: DC | PRN

## 2012-04-19 MED ORDER — ONDANSETRON HCL 4 MG PO TABS: 4.0000 mg | ORAL_TABLET | Freq: Four times a day (QID) | ORAL | Status: DC | PRN

## 2012-04-19 MED ORDER — HEPARIN SOD (PORK) LOCK FLUSH 100 UNIT/ML IV SOLN
500.0000 [IU] | Freq: Once | INTRAVENOUS | Status: AC
  Administered 2012-04-19: 500 [IU] via INTRAVENOUS
  Filled 2012-04-19: qty 5

## 2012-04-19 NOTE — Discharge Summary (Signed)
Physician Discharge Summary  [name] WUJ:[id number] DOB: [birth year] DOA: 04/14/2012  PCP: Abner Greenspan, MD  Admit date: 04/14/2012 Discharge date: 04/19/2012  Recommendations for Outpatient Follow-up:  1. Pt will need to follow up with PCP in 2-3 weeks post discharge 2. Please obtain BMP to evaluate electrolytes and kidney function 3. Please also check CBC to evaluate Hg and Hct levels. WBC 4. Please note that pt has an appointment scheduled with Dr. Matthias Hughs, February 25th, 2014 at 3 pm  Discharge Diagnoses: Abdominal pain Principal Problem:  *Abdominal pain Active Problems:  Hypokalemia  Lymphoma  Discharge Condition: Stable  Diet recommendation: Heart healthy diet discussed in details   Brief narrative:  Pt is 79 y.o. female with past medical history significant for B-cell lymphoma, she was admitted to [facility] long 10-2011 with abdominal pain from perforated sigmoid diverticulum with peritonitis and pelvic abscess requiring laparotomy and sigmoid colectomy, and colostomy. Prolonged stay on ventilator requiring tracheostomy. She went to rehab and then home. She presented to [facility] ED with main concern of generalized abdominal pain, intermittent and sharp in nature and 10/10 in severity when present, radiating to lower abdominal quadrants, aggravated by food intake and with no specific alleviating factors, associated with 3 episodes of non bloody, bilious vomiting.   In ED, pt has received morphine and NGT placed. TRH asked to admit for further evaluation.  Active Problems:  Abdominal pain, nausea and vomiting  - unclear etiology but now resolved, no N/V over 72 hour period  - Abdominal XRAY 01/24 unremarkable  - pt tolerating current diet well Hypokalemia  - secondary to vomiting and hypomagnesemia  - supplemented and within normal limits this AM  Anemia of chronic disease  - pt with lymphoma  - Hg still at baseline  - pt will need to have CBC checked in 1-2 weeks to ensure  that blood counts are stable and at pt's baseline  Malnutrition, severe  - in the setting of chronic illness, lymphoma, recent hospitalization with complications and discharged to SNF  - pt tolerating current diet with no nausea or vomiting   Consultants:  Surgery  Procedures/Studies:  Abdomina XRAY 01//24/2013 No evidence of bowel obstruction or acute finding.  Ct Abdomen Pelvis W Contrast 04/14/2012  Mild fullness of the right renal collecting system is new since the comparison studies but no obstructing lesion is identified.  Wall thickening of the descending colon seen on the most recent CT scan is not appreciated on today's study compatible with resolved colitis.  No change in small bilateral pleural effusions, larger on the left.  Antibiotics:  None  Code Status: Full  Family Communication: Pt at bedside   Discharge Exam: Filed Vitals:   04/19/12 0625  BP: 141/82  Pulse: 98  Temp: 98.4 F (36.9 C)  Resp: 18   Filed Vitals:   04/18/12 0628 04/18/12 1400 04/18/12 2148 04/19/12 0625  BP: 124/70 115/69 135/59 141/82  Pulse: 93 100 88 98  Temp: 98 F (36.7 C) 98.3 F (36.8 C) 98.2 F (36.8 C) 98.4 F (36.9 C)  TempSrc: Oral Oral Oral Oral  Resp: 18 18 18 18   Height:      Weight:      SpO2: 97% 97% 97% 100%    General: Pt is alert, follows commands appropriately, not in acute distress Cardiovascular: Regular rate and rhythm, S1/S2 +, no murmurs, no rubs, no gallops Respiratory: Clear to auscultation bilaterally, no wheezing, no crackles, no rhonchi Abdominal: Soft, non tender, non distended, bowel sounds +,  no guarding Extremities: no edema, no cyanosis, pulses palpable bilaterally DP and PT Neuro: Grossly nonfocal  Discharge Instructions  Discharge Orders    Future Appointments: Provider: Department: Dept Phone: Center:   06/28/2012 11:30 AM Krista Blue [facility] [phone] None   06/28/2012 12:00 PM Victorino December, MD [facility] MEDICAL [facility]COL[facility] [phone] None     Future Orders Please Complete By Expires   Diet - low sodium heart healthy      Increase activity slowly          Medication List     As of 04/19/2012 10:51 AM    STOP taking these medications         doxycycline 100 MG capsule   Commonly known as: VIBRAMYCIN      TAKE these medications         atenolol 25 MG tablet   Commonly known as: TENORMIN   Take 12.5 mg by mouth 2 (two) times daily.      clopidogrel 75 MG tablet   Commonly known as: PLAVIX   Take 75 mg by mouth daily.      fentaNYL 12 MCG/HR   Commonly known as: DURAGESIC - dosed mcg/hr   Place 1 patch (12.5 mcg total) onto the skin every 3 (three) days.      HYDROcodone-acetaminophen 5-325 MG per tablet   Commonly known as: NORCO/VICODIN   Take 1-2 tablets by mouth every 4 (four) hours as needed.      ondansetron 4 MG tablet   Commonly known as: ZOFRAN   Take 1 tablet (4 mg total) by mouth every 6 (six) hours as needed for nausea.      PATADAY 0.2 % Soln   Generic drug: Olopatadine HCl   Apply 1 drop to eye daily.           Follow-up Information    Follow up with HODGES,BETH, MD. In 2 weeks.   Contact information:   [address] [place] [address] [phone]       Follow up with Florencia Reasons, MD.   Contact information:   [address]                         Moshe Cipro [place] [address] [phone]           The results of significant diagnostics from this hospitalization (including imaging, microbiology, ancillary and laboratory) are listed below for reference.     Microbiology: No results found for this or any previous visit (from the past 240 hour(s)).   Labs: Basic Metabolic Panel:  Lab 04/19/12 0272 04/18/12 0640 04/17/12 0425 04/16/12 0505 04/15/12 0541 04/14/12 2340  NA 137 135 134* 139 137 --  K 3.6 3.7 3.8 3.2* 3.6 --  CL 106 105 105 104 104 --  CO2 24 23 25 27 23  --  GLUCOSE  86 92 108* 102* 77 --  BUN <3* <3* <3* 3* 6 --  CREATININE 0.56 0.56 0.56 0.56 0.57 --  CALCIUM 8.1* 8.0* 7.9* 7.7* 7.4* --  MG -- 1.6 1.8 -- -- 1.3*  PHOS -- -- -- -- -- --   Liver Function Tests:  Lab 04/15/12 0541 04/14/12 1309  AST 18 24  ALT 9 11  ALKPHOS 42 49  BILITOT 0.4 0.6  PROT 4.1* 4.6*  ALBUMIN 2.2* 2.7*    Lab 04/14/12 1309  LIPASE 21  AMYLASE --  CBC:  Lab 04/19/12 0655 04/18/12 0640 04/17/12 0425 04/16/12 0505 04/15/12 0541 04/14/12 1309  WBC 2.9* 3.5* 5.4 5.2 6.4 --  NEUTROABS -- -- -- -- -- 3.8  HGB 10.6* 10.1* 10.3* 10.2* 10.0* --  HCT 30.4* 29.5* 30.3* 29.8* 29.6* --  MCV 89.7 90.8 91.0 90.6 90.5 --  PLT 284 274 254 254 264 --   SIGNED: Time coordinating discharge: Over 30 minutes  Debbora Presto, MD  [facility] 04/19/2012, 10:51 AM Pager [phone]  If 7PM-7AM, please contact night-coverage [email or web address] Password [email or web address]

## 2012-04-19 NOTE — Progress Notes (Signed)
  Subjective: She feels fine had regular supper, stool soft but fairly dense in her ostomy along with a good deal of gas.  Objective: Vital signs in last 24 hours: Temp:  [98.2 F (36.8 C)-98.4 F (36.9 C)] 98.4 F (36.9 C) (01/27 0625) Pulse Rate:  [88-100] 98  (01/27 0625) Resp:  [18] 18  (01/27 0625) BP: (115-141)/(59-82) 141/82 mmHg (01/27 0625) SpO2:  [97 %-100 %] 100 % (01/27 0625) Last BM Date: 04/18/12  PO not recorded, stool not recorded, Diet regular, afebrile, WBC is low, H/H is stable.  Intake/Output from previous day: 01/26 0701 - 01/27 0700 In: -  Out: 900 [Urine:900] Intake/Output this shift:    General appearance: alert, cooperative and no distress GI: soft, non-tender; bowel sounds normal; no masses,  no organomegaly and soft, solid brown stool in ostomy, good deal of gas too.  Lab Results:   Basename 04/19/12 0655 04/18/12 0640  WBC 2.9* 3.5*  HGB 10.6* 10.1*  HCT 30.4* 29.5*  PLT 284 274    BMET  Basename 04/18/12 0640 04/17/12 0425  NA 135 134*  K 3.7 3.8  CL 105 105  CO2 23 25  GLUCOSE 92 108*  BUN <3* <3*  CREATININE 0.56 0.56  CALCIUM 8.0* 7.9*   PT/INR No results found for this basename: LABPROT:2,INR:2 in the last 72 hours   Lab 04/15/12 0541 04/14/12 1309  AST 18 24  ALT 9 11  ALKPHOS 42 49  BILITOT 0.4 0.6  PROT 4.1* 4.6*  ALBUMIN 2.2* 2.7*     Lipase     Component Value Date/Time   LIPASE 21 04/14/2012 1309     Studies/Results: No results found.  Medications:    . enoxaparin (LOVENOX) injection  30 mg Subcutaneous Q24H  . fentaNYL  12.5 mcg Transdermal Q72H  . olopatadine  1 drop Both Eyes Daily  . pantoprazole (PROTONIX) IV  40 mg Intravenous QHS  . sodium chloride  3 mL Intravenous Q12H    Assessment/Plan 1. Acute Abdominal pain  Etiology unclear.  Doing well. Tolerated reg diet. To advance to reg diet. Probably home tomorrow.  2. S/P colectomy/colostomy for perforated diverticulitis - 10/2011 - T.  [name] 3. History of B Cell Lymphoma  4. History of TIA  5. Hypertension  6. History of GERD with esophageal stricture  7. DVT prophylaxis - on lovenox   Plan:  No surgical issues at this point.     LOS: 5 days    Lavon Horn 04/19/2012

## 2012-04-19 NOTE — Progress Notes (Signed)
Tolerating a regular diet. No n/v. Stool and gas in bag  Alert, nad Soft, nd, nt. Ostomy functioning  Ok with d/c to home F/u Dr Carylon Perches. Andrey Campanile, MD, FACS General, Bariatric, & Minimally Invasive Surgery [facility], [place]

## 2012-04-19 NOTE — Progress Notes (Signed)
Discharge instructions given to pt, verbalized understanding. Left the unit in stable condition. 

## 2012-04-22 ENCOUNTER — Ambulatory Visit: Payer: No Typology Code available for payment source | Admitting: Oncology

## 2012-04-22 ENCOUNTER — Other Ambulatory Visit: Payer: No Typology Code available for payment source | Admitting: Lab

## 2012-04-22 DIAGNOSIS — I5021 Acute systolic (congestive) heart failure: Secondary | ICD-10-CM | POA: Diagnosis not present

## 2012-04-22 DIAGNOSIS — F329 Major depressive disorder, single episode, unspecified: Secondary | ICD-10-CM | POA: Diagnosis not present

## 2012-04-22 DIAGNOSIS — C8589 Other specified types of non-Hodgkin lymphoma, extranodal and solid organ sites: Secondary | ICD-10-CM | POA: Diagnosis not present

## 2012-04-22 DIAGNOSIS — Z433 Encounter for attention to colostomy: Secondary | ICD-10-CM | POA: Diagnosis not present

## 2012-04-22 DIAGNOSIS — F411 Generalized anxiety disorder: Secondary | ICD-10-CM | POA: Diagnosis not present

## 2012-04-24 DIAGNOSIS — I5021 Acute systolic (congestive) heart failure: Secondary | ICD-10-CM | POA: Diagnosis not present

## 2012-04-24 DIAGNOSIS — F329 Major depressive disorder, single episode, unspecified: Secondary | ICD-10-CM | POA: Diagnosis not present

## 2012-04-24 DIAGNOSIS — C8589 Other specified types of non-Hodgkin lymphoma, extranodal and solid organ sites: Secondary | ICD-10-CM | POA: Diagnosis not present

## 2012-04-24 DIAGNOSIS — F411 Generalized anxiety disorder: Secondary | ICD-10-CM | POA: Diagnosis not present

## 2012-04-24 DIAGNOSIS — Z433 Encounter for attention to colostomy: Secondary | ICD-10-CM | POA: Diagnosis not present

## 2012-04-27 DIAGNOSIS — C8589 Other specified types of non-Hodgkin lymphoma, extranodal and solid organ sites: Secondary | ICD-10-CM | POA: Diagnosis not present

## 2012-04-27 DIAGNOSIS — F329 Major depressive disorder, single episode, unspecified: Secondary | ICD-10-CM | POA: Diagnosis not present

## 2012-04-27 DIAGNOSIS — Z433 Encounter for attention to colostomy: Secondary | ICD-10-CM | POA: Diagnosis not present

## 2012-04-27 DIAGNOSIS — I5021 Acute systolic (congestive) heart failure: Secondary | ICD-10-CM | POA: Diagnosis not present

## 2012-04-27 DIAGNOSIS — F411 Generalized anxiety disorder: Secondary | ICD-10-CM | POA: Diagnosis not present

## 2012-04-28 DIAGNOSIS — C8589 Other specified types of non-Hodgkin lymphoma, extranodal and solid organ sites: Secondary | ICD-10-CM | POA: Diagnosis not present

## 2012-04-28 DIAGNOSIS — F411 Generalized anxiety disorder: Secondary | ICD-10-CM | POA: Diagnosis not present

## 2012-04-28 DIAGNOSIS — I5021 Acute systolic (congestive) heart failure: Secondary | ICD-10-CM | POA: Diagnosis not present

## 2012-04-28 DIAGNOSIS — F329 Major depressive disorder, single episode, unspecified: Secondary | ICD-10-CM | POA: Diagnosis not present

## 2012-04-28 DIAGNOSIS — Z433 Encounter for attention to colostomy: Secondary | ICD-10-CM | POA: Diagnosis not present

## 2012-04-29 DIAGNOSIS — F329 Major depressive disorder, single episode, unspecified: Secondary | ICD-10-CM | POA: Diagnosis not present

## 2012-04-29 DIAGNOSIS — C8589 Other specified types of non-Hodgkin lymphoma, extranodal and solid organ sites: Secondary | ICD-10-CM | POA: Diagnosis not present

## 2012-04-29 DIAGNOSIS — F411 Generalized anxiety disorder: Secondary | ICD-10-CM | POA: Diagnosis not present

## 2012-04-29 DIAGNOSIS — Z433 Encounter for attention to colostomy: Secondary | ICD-10-CM | POA: Diagnosis not present

## 2012-04-29 DIAGNOSIS — I5021 Acute systolic (congestive) heart failure: Secondary | ICD-10-CM | POA: Diagnosis not present

## 2012-04-30 DIAGNOSIS — H04129 Dry eye syndrome of unspecified lacrimal gland: Secondary | ICD-10-CM | POA: Diagnosis not present

## 2012-04-30 DIAGNOSIS — H01009 Unspecified blepharitis unspecified eye, unspecified eyelid: Secondary | ICD-10-CM | POA: Diagnosis not present

## 2012-05-03 DIAGNOSIS — I5021 Acute systolic (congestive) heart failure: Secondary | ICD-10-CM | POA: Diagnosis not present

## 2012-05-03 DIAGNOSIS — Z433 Encounter for attention to colostomy: Secondary | ICD-10-CM | POA: Diagnosis not present

## 2012-05-03 DIAGNOSIS — C8589 Other specified types of non-Hodgkin lymphoma, extranodal and solid organ sites: Secondary | ICD-10-CM | POA: Diagnosis not present

## 2012-05-03 DIAGNOSIS — F411 Generalized anxiety disorder: Secondary | ICD-10-CM | POA: Diagnosis not present

## 2012-05-03 DIAGNOSIS — F329 Major depressive disorder, single episode, unspecified: Secondary | ICD-10-CM | POA: Diagnosis not present

## 2012-05-04 DIAGNOSIS — R300 Dysuria: Secondary | ICD-10-CM | POA: Diagnosis not present

## 2012-05-04 DIAGNOSIS — R350 Frequency of micturition: Secondary | ICD-10-CM | POA: Diagnosis not present

## 2012-05-04 DIAGNOSIS — N3946 Mixed incontinence: Secondary | ICD-10-CM | POA: Diagnosis not present

## 2012-05-04 DIAGNOSIS — R351 Nocturia: Secondary | ICD-10-CM | POA: Diagnosis not present

## 2012-05-04 DIAGNOSIS — N390 Urinary tract infection, site not specified: Secondary | ICD-10-CM | POA: Diagnosis not present

## 2012-05-04 DIAGNOSIS — N3941 Urge incontinence: Secondary | ICD-10-CM | POA: Diagnosis not present

## 2012-05-04 DIAGNOSIS — N35919 Unspecified urethral stricture, male, unspecified site: Secondary | ICD-10-CM | POA: Diagnosis not present

## 2012-05-07 DIAGNOSIS — I5021 Acute systolic (congestive) heart failure: Secondary | ICD-10-CM | POA: Diagnosis not present

## 2012-05-07 DIAGNOSIS — F329 Major depressive disorder, single episode, unspecified: Secondary | ICD-10-CM | POA: Diagnosis not present

## 2012-05-07 DIAGNOSIS — Z433 Encounter for attention to colostomy: Secondary | ICD-10-CM | POA: Diagnosis not present

## 2012-05-07 DIAGNOSIS — F411 Generalized anxiety disorder: Secondary | ICD-10-CM | POA: Diagnosis not present

## 2012-05-07 DIAGNOSIS — C8589 Other specified types of non-Hodgkin lymphoma, extranodal and solid organ sites: Secondary | ICD-10-CM | POA: Diagnosis not present

## 2012-05-08 DIAGNOSIS — C8589 Other specified types of non-Hodgkin lymphoma, extranodal and solid organ sites: Secondary | ICD-10-CM | POA: Diagnosis not present

## 2012-05-08 DIAGNOSIS — Z433 Encounter for attention to colostomy: Secondary | ICD-10-CM | POA: Diagnosis not present

## 2012-05-08 DIAGNOSIS — F411 Generalized anxiety disorder: Secondary | ICD-10-CM | POA: Diagnosis not present

## 2012-05-08 DIAGNOSIS — F329 Major depressive disorder, single episode, unspecified: Secondary | ICD-10-CM | POA: Diagnosis not present

## 2012-05-08 DIAGNOSIS — I5021 Acute systolic (congestive) heart failure: Secondary | ICD-10-CM | POA: Diagnosis not present

## 2012-05-10 DIAGNOSIS — H01009 Unspecified blepharitis unspecified eye, unspecified eyelid: Secondary | ICD-10-CM | POA: Diagnosis not present

## 2012-05-10 DIAGNOSIS — H04129 Dry eye syndrome of unspecified lacrimal gland: Secondary | ICD-10-CM | POA: Diagnosis not present

## 2012-05-11 DIAGNOSIS — I5021 Acute systolic (congestive) heart failure: Secondary | ICD-10-CM | POA: Diagnosis not present

## 2012-05-11 DIAGNOSIS — Z433 Encounter for attention to colostomy: Secondary | ICD-10-CM | POA: Diagnosis not present

## 2012-05-11 DIAGNOSIS — F411 Generalized anxiety disorder: Secondary | ICD-10-CM | POA: Diagnosis not present

## 2012-05-11 DIAGNOSIS — F329 Major depressive disorder, single episode, unspecified: Secondary | ICD-10-CM | POA: Diagnosis not present

## 2012-05-11 DIAGNOSIS — C8589 Other specified types of non-Hodgkin lymphoma, extranodal and solid organ sites: Secondary | ICD-10-CM | POA: Diagnosis not present

## 2012-05-12 DIAGNOSIS — F329 Major depressive disorder, single episode, unspecified: Secondary | ICD-10-CM | POA: Diagnosis not present

## 2012-05-12 DIAGNOSIS — C8589 Other specified types of non-Hodgkin lymphoma, extranodal and solid organ sites: Secondary | ICD-10-CM | POA: Diagnosis not present

## 2012-05-12 DIAGNOSIS — Z433 Encounter for attention to colostomy: Secondary | ICD-10-CM | POA: Diagnosis not present

## 2012-05-12 DIAGNOSIS — F411 Generalized anxiety disorder: Secondary | ICD-10-CM | POA: Diagnosis not present

## 2012-05-12 DIAGNOSIS — I5021 Acute systolic (congestive) heart failure: Secondary | ICD-10-CM | POA: Diagnosis not present

## 2012-05-13 ENCOUNTER — Other Ambulatory Visit: Payer: No Typology Code available for payment source | Admitting: Lab

## 2012-05-13 ENCOUNTER — Ambulatory Visit: Payer: No Typology Code available for payment source | Admitting: Family

## 2012-05-13 DIAGNOSIS — I5021 Acute systolic (congestive) heart failure: Secondary | ICD-10-CM | POA: Diagnosis not present

## 2012-05-13 DIAGNOSIS — F411 Generalized anxiety disorder: Secondary | ICD-10-CM | POA: Diagnosis not present

## 2012-05-13 DIAGNOSIS — Z433 Encounter for attention to colostomy: Secondary | ICD-10-CM | POA: Diagnosis not present

## 2012-05-13 DIAGNOSIS — C8589 Other specified types of non-Hodgkin lymphoma, extranodal and solid organ sites: Secondary | ICD-10-CM | POA: Diagnosis not present

## 2012-05-13 DIAGNOSIS — F329 Major depressive disorder, single episode, unspecified: Secondary | ICD-10-CM | POA: Diagnosis not present

## 2012-05-18 DIAGNOSIS — I5021 Acute systolic (congestive) heart failure: Secondary | ICD-10-CM | POA: Diagnosis not present

## 2012-05-18 DIAGNOSIS — F411 Generalized anxiety disorder: Secondary | ICD-10-CM | POA: Diagnosis not present

## 2012-05-18 DIAGNOSIS — Z433 Encounter for attention to colostomy: Secondary | ICD-10-CM | POA: Diagnosis not present

## 2012-05-18 DIAGNOSIS — F329 Major depressive disorder, single episode, unspecified: Secondary | ICD-10-CM | POA: Diagnosis not present

## 2012-05-18 DIAGNOSIS — R1033 Periumbilical pain: Secondary | ICD-10-CM | POA: Diagnosis not present

## 2012-05-18 DIAGNOSIS — C8589 Other specified types of non-Hodgkin lymphoma, extranodal and solid organ sites: Secondary | ICD-10-CM | POA: Diagnosis not present

## 2012-05-24 ENCOUNTER — Other Ambulatory Visit: Payer: Self-pay

## 2012-05-24 ENCOUNTER — Telehealth: Payer: Self-pay | Admitting: Medical Oncology

## 2012-05-24 DIAGNOSIS — Z1231 Encounter for screening mammogram for malignant neoplasm of breast: Secondary | ICD-10-CM

## 2012-05-24 NOTE — Telephone Encounter (Signed)
Patient wishing to inform office that she wishes for her next fentanyl prescriptions to be with [facility]. Informed patient that I would put that request/note in her chart. No further questions at this time. Patient knows to call office with questions and concerns.

## 2012-05-31 ENCOUNTER — Other Ambulatory Visit: Payer: Self-pay | Admitting: Emergency Medicine

## 2012-05-31 MED ORDER — FENTANYL 12 MCG/HR TD PT72: 1.0000 | MEDICATED_PATCH | TRANSDERMAL | Status: DC

## 2012-06-24 ENCOUNTER — Ambulatory Visit
Admission: RE | Admit: 2012-06-24 | Discharge: 2012-06-24 | Disposition: A | Discharge status: Home / Self Care | Payer: Medicare Other | Source: Ambulatory Visit

## 2012-06-24 DIAGNOSIS — Z1231 Encounter for screening mammogram for malignant neoplasm of breast: Secondary | ICD-10-CM

## 2012-06-24 DIAGNOSIS — Z0000 Encounter for general adult medical examination without abnormal findings: Secondary | ICD-10-CM | POA: Diagnosis not present

## 2012-06-24 DIAGNOSIS — Z01419 Encounter for gynecological examination (general) (routine) without abnormal findings: Secondary | ICD-10-CM | POA: Diagnosis not present

## 2012-06-28 ENCOUNTER — Ambulatory Visit (INDEPENDENT_AMBULATORY_CARE_PROVIDER_SITE_OTHER): Payer: Medicare Other | Admitting: Surgery

## 2012-06-28 ENCOUNTER — Encounter (INDEPENDENT_AMBULATORY_CARE_PROVIDER_SITE_OTHER): Payer: Self-pay | Admitting: Surgery

## 2012-06-28 ENCOUNTER — Telehealth (INDEPENDENT_AMBULATORY_CARE_PROVIDER_SITE_OTHER): Payer: Self-pay | Admitting: *Deleted

## 2012-06-28 ENCOUNTER — Telehealth: Payer: Self-pay | Admitting: Oncology

## 2012-06-28 ENCOUNTER — Other Ambulatory Visit: Payer: Medicare Other | Admitting: Lab

## 2012-06-28 ENCOUNTER — Encounter: Payer: Self-pay | Admitting: Oncology

## 2012-06-28 ENCOUNTER — Ambulatory Visit (HOSPITAL_BASED_OUTPATIENT_CLINIC_OR_DEPARTMENT_OTHER): Payer: Medicare Other | Admitting: Oncology

## 2012-06-28 VITALS — BP 119/64 | HR 66 | Temp 97.6°F | Resp 20 | Ht 59.0 in | Wt 96.7 lb

## 2012-06-28 VITALS — BP 144/82 | HR 78 | Resp 18 | Ht 59.0 in | Wt 98.0 lb

## 2012-06-28 DIAGNOSIS — R109 Unspecified abdominal pain: Secondary | ICD-10-CM

## 2012-06-28 DIAGNOSIS — G8929 Other chronic pain: Secondary | ICD-10-CM

## 2012-06-28 DIAGNOSIS — C8280 Other types of follicular lymphoma, unspecified site: Secondary | ICD-10-CM

## 2012-06-28 DIAGNOSIS — C8298 Follicular lymphoma, unspecified, lymph nodes of multiple sites: Secondary | ICD-10-CM

## 2012-06-28 DIAGNOSIS — M549 Dorsalgia, unspecified: Secondary | ICD-10-CM | POA: Diagnosis not present

## 2012-06-28 DIAGNOSIS — K469 Unspecified abdominal hernia without obstruction or gangrene: Secondary | ICD-10-CM

## 2012-06-28 DIAGNOSIS — K435 Parastomal hernia without obstruction or  gangrene: Secondary | ICD-10-CM | POA: Insufficient documentation

## 2012-06-28 LAB — CBC WITH DIFFERENTIAL/PLATELET
BASO%: 0.6 % (ref 0.0–2.0)
Basophils Absolute: 0 10*3/uL (ref 0.0–0.1)
EOS%: 2.7 % (ref 0.0–7.0)
HCT: 35.6 % (ref 34.8–46.6)
HGB: 11.7 g/dL (ref 11.6–15.9)
LYMPH%: 14.9 % (ref 14.0–49.7)
MCH: 30.6 pg (ref 25.1–34.0)
MCHC: 32.9 g/dL (ref 31.5–36.0)
NEUT%: 71.5 % (ref 38.4–76.8)
Platelets: 257 10*3/uL (ref 145–400)

## 2012-06-28 LAB — COMPREHENSIVE METABOLIC PANEL (CC13)
ALT: 11 U/L (ref 0–55)
BUN: 23.7 mg/dL (ref 7.0–26.0)
CO2: 26 mEq/L (ref 22–29)
Calcium: 9.6 mg/dL (ref 8.4–10.4)
Creatinine: 0.9 mg/dL (ref 0.6–1.1)
Total Bilirubin: 0.49 mg/dL (ref 0.20–1.20)

## 2012-06-28 MED ORDER — FENTANYL 12 MCG/HR TD PT72: 1.0000 | MEDICATED_PATCH | TRANSDERMAL | Status: DC

## 2012-06-28 NOTE — Patient Instructions (Addendum)
Consider getting an ostomy consult with Page Spiro at [facility] To help troubleshoot collagen fittings and other issues with your ostomy: [phone]  Ostomy Support Information An ostomy is an opening in the belly (abdominal wall) made by surgery. Ostomates are people who have had this procedure. The opening (stoma) allows the kidney or bowel to discharge waste. An external pouch covers the stoma to collect waste. Pouches are are a simple bag and are odor free. Different companies have disposable or reusable pouches to fit one's lifestyle. An ostomy can either be temporary or permanent.  THERE ARE THREE MAIN TYPES OF OSTOMIES  Colostomy. A colostomy is a surgically created opening in the large intestine (colon).  Ileostomy. An ileostomy is a surgically created opening in the small intestine.  Urostomy. A urostomy is a surgically created opening to divert urine away from the bladder. FREQUENTLY ASKED QUESTIONS Will I need to be on a special diet? Most people return to their normal diet when they have recovered from surgery. Be sure to chew your food well, eat a well-balanced diet and drink plenty of fluids. If you experience problems with a certain food, wait a couple of weeks and try it again. Will there be odor and noises? Pouching systems are designed to be odor-proof or odor-resistant. There are deodorants that can be used in the pouch. Medications are also available to help reduce odor. Limit gas-producing foods and carbonated beverages. You will experience less gas and fewer noises as you heal from surgery. How much time will it take to care for my ostomy? At first, you may spend a lot of time learning about your ostomy and how to take care of it. As you become more comfortable and skilled at changing the pouching system, it will take very little time to care for it.  Will I be able to return to work? People with ostomies can perform most jobs. As soon as you have healed  from surgery, you should be able to return to work. Heavy lifting (more than 10 pounds) may be discouraged.  What about intimacy? Sexual relationships and intimacy are important and fulfilling aspects of your life. They should continue after ostomy surgery. Intimacy-related concerns should be discussed openly between you and your partner.  Can I wear regular clothing? You do not need to wear special clothing. Ostomy pouches are fairly flat and barely noticeable. Elastic undergarments will not hurt the stoma or prevent the ostomy from functioning.  Can I participate in sports? An ostomy should not limit your involvement in sports. Many people with ostomies are runners, skiers, swimmers or participate in other active lifestyles. Talk with your caregiver first before doing heavy physical activity. PROFESSIONAL HELP  Resources are available if you need help or have questions about your ostomy.   Specially trained nurses called Wound, Ostomy Continence Nurses (WOCN) are available for consultation in most major medical centers.  The [employer] ([employer]OA) is a group made up of many local chapters throughout the [place]. These local groups hold meetings and provide support to prospective and existing ostomates. They sponsor educational events and have qualified visitors to make personal or telephone visits. Contact the UOA for the chapter nearest you and for other educational publications.  More detailed information can be found in Colostomy Guide, a publication of the [employer] (UOA). Contact UOA at 1-262-716-0619 or visit their web site at YellowSpecialist.at. The website contains links to other sites, suppliers and resources. Document Released: 03/13/2003 Document Revised:  06/02/2011 Document Reviewed: 07/12/2008 [facility] Patient Information 2013 [place], [place].

## 2012-06-28 NOTE — Progress Notes (Signed)
OFFICE PROGRESS NOTE  CC Dr. Tommie Ard Dr. 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  PRIOR THERAPY:  #1 patient had a PET/CT performed that revealed Upper normal sized axillary and subpectoral nodes, corresponding to hypermetabolism at PET.CT of the abdomen and pelvis revealed Pelvic adenopathy, consistent with active disease. Equivocal abdominal retroperitoneal findings. Borderline adenopathy without hypermetabolism superiorly. A small more inferior retroperitoneal node demonstrates mild nonspecific hypermetabolism at PET. Scattered liver lesions, technically too small to characterize  but most likely benign cysts or hemangiomas.  #2 patient will begin systemic chemotherapy as she is symptomatic from her lymphoma. I have recommended CVP Rituxan combination given every 21 days for a total of 4 cycles. Started on 09/05/11-11/2011  #3 patient with bowel perforation requiring emergent surgery followed by prolonged intensive care unit stay and most recently rehabilitation. Patient with lower back pain on fentanyl patches.  CURRENT THERAPY: Observation/pain management  INTERVAL HISTORY: Vevelyn Jefferson 79 y.o. female returns for followup visit . She seems to be making good progress. She is now walking with a cane. She also has gained a few pounds. However she is still concerned about the colostomy and a stoma. She thinks that it is herniating. I have contacted Dr. Ardine Eng office for him to see the patient as soon as possible even today. Her pain is well-controlled with fentanyl patches she is only on 12 mcg every 3 days. This just takes the edge off for her. She otherwise denies any nausea or vomiting. She does have some neuropathic pains here and there. She has no bleeding problems. She has not noticed any fevers chills or night sweats. Remainder of the 10 point review of systems is negative. MEDICAL HISTORY: Past Medical History  Diagnosis Date   . Cancer 07/08/11    follicular B cell lymphoma  . TIA (transient ischemic attack)   . Arthritis   . Hypertension   . Esophageal abnormality     strictures  . Arthritis 09/04/2011  . Dehydration 11/20/2011  . Diarrhea 11/20/2011  . Complication of anesthesia   . PONV (postoperative nausea and vomiting)     in the 1980's  . GERD (gastroesophageal reflux disease)   . Esophageal stricture     x 2    ALLERGIES:  is allergic to codeine; other; penicillins; and sulfa antibiotics.  MEDICATIONS:  Current Outpatient Prescriptions  Medication Sig Dispense Refill  . clopidogrel (PLAVIX) 75 MG tablet Take 75 mg by mouth daily.      . fentaNYL (DURAGESIC - DOSED MCG/HR) 12 MCG/HR Place 1 patch (12.5 mcg total) onto the skin every 3 (three) days.  15 patch  0  . HYDROcodone-acetaminophen (NORCO/VICODIN) 5-325 MG per tablet Take 1-2 tablets by mouth every 4 (four) hours as needed.  45 tablet  0  . megestrol (MEGACE) 40 MG/ML suspension       . metoprolol (LOPRESSOR) 100 MG tablet       . ondansetron (ZOFRAN) 4 MG tablet Take 1 tablet (4 mg total) by mouth every 6 (six) hours as needed for nausea.  45 tablet  0  . atenolol (TENORMIN) 25 MG tablet Take 12.5 mg by mouth 2 (two) times daily.       . Olopatadine HCl (PATADAY) 0.2 % SOLN Apply 1 drop to eye daily.       No current facility-administered medications for this visit.    SURGICAL HISTORY:  Past Surgical History  Procedure Laterality Date  . Appendectomy    .  Parotidectomy w/ neck dissection total    . Tonsillectomy    . Abdominal hysterectomy      left ovaries  . Laparotomy  11/22/2011    Procedure: EXPLORATORY LAPAROTOMY;  Surgeon: Velora Heckler, MD;  Location: [facility] ORS;  Service: General;  Laterality: N/A;  drainage of pelvic abscess  . Colostomy revision  11/22/2011    Procedure: COLON RESECTION SIGMOID;  Surgeon: Velora Heckler, MD;  Location: [facility] ORS;  Service: General;  Laterality: N/A;  . Colostomy  11/22/2011    Procedure:  COLOSTOMY;  Surgeon: Velora Heckler, MD;  Location: [facility] ORS;  Service: General;  Laterality: N/A;  desending colostomy  . Esophagogastroduodenoscopy  01/06/2012    Procedure: ESOPHAGOGASTRODUODENOSCOPY (EGD);  Surgeon: Rachael Fee, MD;  Location: [facility] ENDOSCOPY;  Service: Endoscopy;  Laterality: N/A;    REVIEW OF SYSTEMS:  Pertinent items are noted in HPI.   PHYSICAL EXAMINATION: Lymph nodes: Cervical, supraclavicular, and axillary nodes normal. Cardio: regular rate and rhythm, S1, S2 normal, no murmur, click, rub or gallop GI: soft, non-tender; bowel sounds normal; no masses,  no organomegaly Extremities: extremities normal, atraumatic, no cyanosis or edema Neurologic: Grossly normal  ECOG PERFORMANCE STATUS: 1 - Symptomatic but completely ambulatory  Blood pressure 119/64, pulse 66, temperature 97.6 F (36.4 C), temperature source Oral, resp. rate 20, height 4\' 11"  (1.499 m), weight 96 lb 11.2 oz (43.863 kg).  LABORATORY DATA: Lab Results  Component Value Date   WBC 5.9 06/28/2012   HGB 11.7 06/28/2012   HCT 35.6 06/28/2012   MCV 93.1 06/28/2012   PLT 257 06/28/2012      Chemistry      Component Value Date/Time   NA 137 04/19/2012 0655   NA 138 03/30/2012 1045   K 3.6 04/19/2012 0655   K 3.5 03/30/2012 1045   CL 106 04/19/2012 0655   CL 104 03/30/2012 1045   CO2 24 04/19/2012 0655   CO2 24 03/30/2012 1045   BUN <3* 04/19/2012 0655   BUN 10.0 03/30/2012 1045   CREATININE 0.56 04/19/2012 0655   CREATININE 0.8 03/30/2012 1045      Component Value Date/Time   CALCIUM 8.1* 04/19/2012 0655   CALCIUM 9.2 03/30/2012 1045   ALKPHOS 42 04/15/2012 0541   ALKPHOS 73 03/30/2012 1045   AST 18 04/15/2012 0541   AST 28 03/30/2012 1045   ALT 9 04/15/2012 0541   ALT 12 03/30/2012 1045   BILITOT 0.4 04/15/2012 0541   BILITOT 0.62 03/30/2012 1045       RADIOGRAPHIC STUDIES:  ASSESSMENT: 79 year old female with  #1 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 She is status post staging scans which does reveals  disease in the chest axilla as well as pelvic region. Patient is also symptomatic with abdominal and pelvic pain.Patient went on to receive 3 cycles of CVP/R.  #2 s/p bowel perforation with resection and colostomy. Now with possible herniation of stoma  #3 chronic back pain on fentanyl patches.  PLAN:  #1 patient will receive Fentanyl patch refills.  #2 patient needs to be seen by Dr. Gerrit Friends for the possible stoma herniation  #3 she will be seen back in about 6 months time for followup. Of course I can see her sooner if need arises.  All questions were answered. The patient knows to call the clinic with any problems, questions or concerns. We can certainly see the patient much sooner if necessary.  I spent 25 minutes counseling the patient face to face.  The total time spent in the appointment was 30 minutes.    Drue Second, MD Medical/Oncology [facility] [phone] (beeper) [phone] (Office)  06/28/2012, 12:13 PM

## 2012-06-28 NOTE — Telephone Encounter (Signed)
Dr. Welton Flakes called to ask that patient be seen hopefully today due to a possible herniated stoma.  Spoke to [name] MD who asked for patient to be placed in urgent office.  Appt made in urgent office.  Dr. Welton Flakes updated and will update patient.

## 2012-06-28 NOTE — Patient Instructions (Addendum)
Refer to Dr. Gerrit Friends for follow up regarding the colostomy  I will see her back in 6 months

## 2012-06-28 NOTE — Progress Notes (Signed)
Subjective:     Patient ID: Domingo Mend, female   DOB: 05-13-1931, 79 y.o.   MRN: [id number]  HPI  Adaora Mchaney  [birth year] [id number]  Patient Care Team: Abner Greenspan as PCP - General (Family Medicine) Velora Heckler, MD as Consulting Physician (General Surgery) Florencia Reasons, MD as Consulting Physician (Gastroenterology) Victorino December, MD as Consulting Physician (Medical Oncology)  This patient is a 79 y.o.female who presents today for surgical evaluation at the request of Dr. Welton Flakes.   Reason for visit: Pain and colostomy.  Question of parastomal hernia.  Is an elderly woman who had perforated diverticulitis.  Required emergency laparotomy colectomy and colostomy by Dr. Gerrit Friends in 11/22/2011.  Had a rough postoperative course.  In the ICU.  Tracheostomy.  LTAC.  Prolonged rehabilitation.  Eventually improved several months later.  Returned in the hospital with question of gastroparesis vs. Partial small bowel obstruction but improved.  She has been home for several months now.  She changes the colostomy appliance every other day.  Notes it is annoying to have to fit the bag.  No leaking.  However, she will get some discomfort.  They have noticed bulging as well.  They point this out to Dr. Welton Flakes her oncologist.  Dr. Welton Flakes wanted the patient to be seen by Dr. Gerrit Friends today.  Dr. Gerrit Friends was not available, so I am seeing the patient urgently.  No nausea or vomiting.  No fevers chills or sweats.  Her weight has been in the mid-90s since she was discharged in December.  That has not changed.  She walks with a cane.  She is walking much more now.  Feeling more independent.  Her husband says it is hard to keep up with her now.  Energy level getting better as well.  Appetite good.  Patient Active Problem List  Diagnosis  . Follicular Low Grade B-Cell Lymphoma  . Esophageal abnormality  . Arthritis  . Atrial fibrillation  . Acute systolic CHF (congestive heart failure)  . Pleural effusion on  right  . Dysphagia  . Abdominal pain  . Hypokalemia  . 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 Cancer 07/08/11    follicular B cell lymphoma  . TIA (transient ischemic attack)   . Arthritis   . Hypertension   . Esophageal abnormality     strictures  . Arthritis 09/04/2011  . Dehydration 11/20/2011  . Diarrhea 11/20/2011  . Complication of anesthesia   . PONV (postoperative nausea and vomiting)     in the 1980's  . GERD (gastroesophageal reflux disease)   . Esophageal stricture     x 2  . Septic shock(785.52) 11/22/2011  . Candida esophagitis 01/06/2012  . Diverticulitis of colon with perforation 11/21/2011    Past Surgical History  Procedure Laterality Date  . Appendectomy    . Parotidectomy w/ neck dissection total    . Tonsillectomy    . Abdominal hysterectomy      left ovaries  . Laparotomy  11/22/2011    Procedure: EXPLORATORY LAPAROTOMY;  Surgeon: Velora Heckler, MD;  Location: [facility];  Service: General;  Laterality: N/A;  drainage of pelvic abscess  . Colostomy revision  11/22/2011    Procedure: COLON RESECTION SIGMOID;  Surgeon: Velora Heckler, MD;  Location: [facility];  Service: General;  Laterality: N/A;  . Colostomy  11/22/2011    Procedure: COLOSTOMY;  Surgeon: Velora Heckler, MD;  Location: [facility];  Service: General;  Laterality: N/A;  desending colostomy  . Esophagogastroduodenoscopy  01/06/2012    Procedure: ESOPHAGOGASTRODUODENOSCOPY (EGD);  Surgeon: Rachael Fee, MD;  Location[facility] END[facility];  Service: Endoscopy;  Laterality: N/A;    History   Social History  . Marital Status: Married    Spouse Name: N/A    Number of Children: N/A  . Years of Education: N/A   Occupational History  . Not on file.   Social History Main Topics  . Smoking status: Never Smoker   . Smokeless tobacco: Never Used  . Alcohol Use: No  . Drug Use: No  . Sexually Active: Yes   Other Topics Concern  . Not on file    Social History Narrative  . No narrative on file    Family History  Problem Relation Age of Onset  . Cancer Mother 6    gastric  . Hypertension Father     heart disease  . Cancer Maternal Aunt 28    breast cancer  . Cancer Maternal Uncle 101    colon    Current Outpatient Prescriptions  Medication Sig Dispense Refill  . atenolol (TENORMIN) 25 MG tablet Take 12.5 mg by mouth 2 (two) times daily.       . clopidogrel (PLAVIX) 75 MG tablet Take 75 mg by mouth daily.      . fentaNYL (DURAGESIC - DOSED MCG/HR) 12 MCG/HR Place 1 patch (12.5 mcg total) onto the skin every 3 (three) days.  15 patch  0  . HYDROcodone-acetaminophen (NORCO/VICODIN) 5-325 MG per tablet Take 1-2 tablets by mouth every 4 (four) hours as needed.  45 tablet  0  . megestrol (MEGACE) 40 MG/ML suspension       . metoprolol (LOPRESSOR) 100 MG tablet       . Olopatadine HCl (PATADAY) 0.2 % SOLN Apply 1 drop to eye daily.      . ondansetron (ZOFRAN) 4 MG tablet Take 1 tablet (4 mg total) by mouth every 6 (six) hours as needed for nausea.  45 tablet  0   No current facility-administered medications for this visit.     Allergies  Allergen Reactions  . Codeine Hives  . Other Nausea And Vomiting    anesthesia  . Penicillins Hives  . Sulfa Antibiotics Hives    BP 144/82  Pulse 78  Resp 18  Ht 4\' 11"  (1.499 m)  Wt 98 lb (44.453 kg)  BMI 19.78 kg/m2  Mm Digital Screening  06/24/2012  *RADIOLOGY REPORT*  Clinical Data: Screening.  DIGITAL BILATERAL SCREENING MAMMOGRAM WITH CAD DIGITAL BREAST TOMOSYNTHESIS  Digital breast tomosynthesis images are acquired in two projections.  These images are reviewed in combination with the digital mammogram, confirming the findings below.  Comparison:  11/04/2010, 11/02/2009, 10/25/2008, 10/25/2007  FINDINGS:  ACR Breast Density Category 2: There are scattered fibroglandular densities.  There is no suspicious dominant mass, architectural distortion, or calcification to suggest  malignancy.  Images were processed with CAD.  IMPRESSION: No mammographic evidence of malignancy.  A result letter of this screening mammogram will be mailed directly to the patient.  RECOMMENDATION: Screening mammogram in one year. (Code:SM-B-01Y)  BI-RADS CATEGORY 1:  Negative.   Original Report Authenticated By: Cain Saupe, M.D.      Review of Systems  Constitutional: Positive for activity change. Negative for fever, chills, diaphoresis, appetite change, fatigue and unexpected weight change.  HENT: Negative for ear pain, sore throat, trouble swallowing, neck pain and ear discharge.   Eyes:  Negative for photophobia, discharge and visual disturbance.  Respiratory: Negative for cough, choking, chest tightness and shortness of breath.   Cardiovascular: Negative for chest pain and palpitations.  Gastrointestinal: Positive for abdominal pain. Negative for nausea, vomiting, diarrhea, constipation, anal bleeding and rectal pain.  Genitourinary: Positive for flank pain. Negative for dysuria, frequency and difficulty urinating.  Musculoskeletal: Positive for back pain and arthralgias. Negative for myalgias and gait problem.  Skin: Negative for color change, pallor and rash.  Neurological: Negative for dizziness, speech difficulty, weakness, light-headedness, numbness and headaches.  Hematological: Negative for adenopathy.  Psychiatric/Behavioral: Negative for confusion and agitation. The patient is not nervous/anxious.        Objective:   Physical Exam  Constitutional: She is oriented to person, place, and time. She appears well-developed and well-nourished. No distress.  HENT:  Head: Normocephalic.  Mouth/Throat: Oropharynx is clear and moist. No oropharyngeal exudate.  Eyes: Conjunctivae and EOM are normal. Pupils are equal, round, and reactive to light. No scleral icterus.  Neck: Normal range of motion. Neck supple. No tracheal deviation present.  Cardiovascular: Normal rate, regular  rhythm and intact distal pulses.   Pulmonary/Chest: Effort normal and breath sounds normal. No respiratory distress. She exhibits no tenderness.  Abdominal: Soft. She exhibits no distension and no mass. There is no tenderness. Hernia confirmed negative in the right inguinal area and confirmed negative in the left inguinal area.    Genitourinary: No vaginal discharge found.  Musculoskeletal: Normal range of motion. She exhibits no tenderness.       Thoracic back: She exhibits deformity.  Moderate kyphosis thoracic spine  Lymphadenopathy:    She has no cervical adenopathy.       Right: No inguinal adenopathy present.       Left: No inguinal adenopathy present.  Neurological: She is alert and oriented to person, place, and time. No cranial nerve deficit. She exhibits normal muscle tone. Coordination normal.  Skin: Skin is warm and dry. No rash noted. She is not diaphoretic. No erythema.  Psychiatric: She has a normal mood and affect. Her behavior is normal. Judgment and thought content normal.       Assessment:     Parastomal hernia lateral to her left lower quadrant colostomy.  No evidence of obstruction.    Plan:     I see no indication for surgery at this time.  The most definitive treatment for this is colostomy takedown.  She and her husband do not seem to be particularly interested in this now.  They have been told that that was not an option in the past.  Certainly with her prolonged tracheostomy and LTAC and rehabilitation Aug-Dec 2013 To recover from her colon perforation at the end of August; and, readmission in January with question of bowel obstruction, I can understand why that would not be an option.  However, since she is left the skilled facility she is made marked improvements the past three months.   Her albumin is 3.6.  Her exercise tolerance is better.  I do not think that she is absolutely contraindicated.  However, I would want medical and cardiac clearance first.    Perhaps it is an even better option a few months if she even gets better.  However, not going to force the issue.  Another option would be a laparoscopic underlay repair with Gore mesh and primary repair of the hernia..  However, I would think she probably should just have the colostomy taken down and she is going to  have anesthesia.  Another option option is observation which is reasonable.  Bowel regimen.  Have her see [facility], Page Spiro.  They can see if there is a better way To manage the stoma  that is more comfortable for her.  The lack of leaking or skin problems is encouraging that that her stoma is not severely difficult.  Preps all she needs is a more convex wafer.  At this point, they will think about things but are happy to have ostomy training and guidance.  We have her handouts, ostomy training care box, and information about [facility].   If anything changes, they will let us know.

## 2012-07-01 DIAGNOSIS — H524 Presbyopia: Secondary | ICD-10-CM | POA: Diagnosis not present

## 2012-07-01 DIAGNOSIS — Z961 Presence of intraocular lens: Secondary | ICD-10-CM | POA: Diagnosis not present

## 2012-07-01 DIAGNOSIS — H52209 Unspecified astigmatism, unspecified eye: Secondary | ICD-10-CM | POA: Diagnosis not present

## 2012-07-01 DIAGNOSIS — H35379 Puckering of macula, unspecified eye: Secondary | ICD-10-CM | POA: Diagnosis not present

## 2012-07-07 NOTE — Telephone Encounter (Signed)
Erroneous

## 2012-07-12 DIAGNOSIS — I10 Essential (primary) hypertension: Secondary | ICD-10-CM | POA: Diagnosis not present

## 2012-07-12 DIAGNOSIS — Z8673 Personal history of transient ischemic attack (TIA), and cerebral infarction without residual deficits: Secondary | ICD-10-CM | POA: Diagnosis not present

## 2012-07-12 DIAGNOSIS — R609 Edema, unspecified: Secondary | ICD-10-CM | POA: Diagnosis not present

## 2012-07-12 DIAGNOSIS — C8299 Follicular lymphoma, unspecified, extranodal and solid organ sites: Secondary | ICD-10-CM | POA: Diagnosis not present

## 2012-07-14 DIAGNOSIS — R609 Edema, unspecified: Secondary | ICD-10-CM | POA: Diagnosis not present

## 2012-07-16 DIAGNOSIS — R609 Edema, unspecified: Secondary | ICD-10-CM | POA: Diagnosis not present

## 2012-07-16 DIAGNOSIS — I517 Cardiomegaly: Secondary | ICD-10-CM | POA: Diagnosis not present

## 2012-07-16 DIAGNOSIS — M7989 Other specified soft tissue disorders: Secondary | ICD-10-CM | POA: Diagnosis not present

## 2012-07-18 DIAGNOSIS — L0291 Cutaneous abscess, unspecified: Secondary | ICD-10-CM | POA: Diagnosis not present

## 2012-08-05 DIAGNOSIS — R195 Other fecal abnormalities: Secondary | ICD-10-CM | POA: Diagnosis not present

## 2012-09-07 ENCOUNTER — Other Ambulatory Visit: Payer: Self-pay | Admitting: Emergency Medicine

## 2012-09-07 MED ORDER — FENTANYL 12 MCG/HR TD PT72: 1.0000 | MEDICATED_PATCH | TRANSDERMAL | Status: DC

## 2012-09-07 NOTE — Telephone Encounter (Signed)
Patient walked in to pick up fentanyl script.  Collaborative nurse notified as no script ready.  Patient says she will call to make an appt. with Dr. Welton Flakes as she is still having pain despite wearing patch.  Next f/u scheduled in October 2014.  Denies having Hydrocodone-acetaminophen 5-325 noted from January 2014.  In [facility] after hospital d/c for several months.  Pain comes and goes.  Pain is to her abdomen, under ribs and when she lies down at night it's worse & when she eats it moves around especially on her left side.  Collaborative nurse will discuss this with Dr. Welton Flakes and contact patient as needed.

## 2012-09-09 DIAGNOSIS — K222 Esophageal obstruction: Secondary | ICD-10-CM | POA: Diagnosis not present

## 2012-09-09 DIAGNOSIS — Z1331 Encounter for screening for depression: Secondary | ICD-10-CM | POA: Diagnosis not present

## 2012-09-09 DIAGNOSIS — Z681 Body mass index (BMI) 19 or less, adult: Secondary | ICD-10-CM | POA: Diagnosis not present

## 2012-09-09 DIAGNOSIS — C8589 Other specified types of non-Hodgkin lymphoma, extranodal and solid organ sites: Secondary | ICD-10-CM | POA: Diagnosis not present

## 2012-09-09 DIAGNOSIS — G459 Transient cerebral ischemic attack, unspecified: Secondary | ICD-10-CM | POA: Diagnosis not present

## 2012-09-10 NOTE — Telephone Encounter (Signed)
e

## 2012-09-15 DIAGNOSIS — Z933 Colostomy status: Secondary | ICD-10-CM | POA: Diagnosis not present

## 2012-09-15 DIAGNOSIS — M159 Polyosteoarthritis, unspecified: Secondary | ICD-10-CM | POA: Diagnosis not present

## 2012-09-15 DIAGNOSIS — M6530 Trigger finger, unspecified finger: Secondary | ICD-10-CM | POA: Diagnosis not present

## 2012-09-15 DIAGNOSIS — C8299 Follicular lymphoma, unspecified, extranodal and solid organ sites: Secondary | ICD-10-CM | POA: Diagnosis not present

## 2012-09-15 DIAGNOSIS — K630 Abscess of intestine: Secondary | ICD-10-CM | POA: Diagnosis not present

## 2012-09-15 DIAGNOSIS — M479 Spondylosis, unspecified: Secondary | ICD-10-CM | POA: Diagnosis not present

## 2012-09-15 DIAGNOSIS — M7710 Lateral epicondylitis, unspecified elbow: Secondary | ICD-10-CM | POA: Diagnosis not present

## 2012-09-15 DIAGNOSIS — K5730 Diverticulosis of large intestine without perforation or abscess without bleeding: Secondary | ICD-10-CM | POA: Diagnosis not present

## 2012-10-04 ENCOUNTER — Telehealth: Payer: Self-pay | Admitting: Oncology

## 2012-10-04 NOTE — Telephone Encounter (Signed)
Sent medical records to [facility].

## 2012-10-18 DIAGNOSIS — H00019 Hordeolum externum unspecified eye, unspecified eyelid: Secondary | ICD-10-CM | POA: Diagnosis not present

## 2012-10-29 ENCOUNTER — Other Ambulatory Visit: Payer: Self-pay | Admitting: Medical Oncology

## 2012-10-29 DIAGNOSIS — I10 Essential (primary) hypertension: Secondary | ICD-10-CM | POA: Diagnosis not present

## 2012-10-29 DIAGNOSIS — I4891 Unspecified atrial fibrillation: Secondary | ICD-10-CM | POA: Diagnosis not present

## 2012-10-29 DIAGNOSIS — E785 Hyperlipidemia, unspecified: Secondary | ICD-10-CM | POA: Diagnosis not present

## 2012-10-29 DIAGNOSIS — R82998 Other abnormal findings in urine: Secondary | ICD-10-CM | POA: Diagnosis not present

## 2012-10-29 MED ORDER — FENTANYL 12 MCG/HR TD PT72: 1.0000 | MEDICATED_PATCH | TRANSDERMAL | Status: DC

## 2012-11-09 ENCOUNTER — Other Ambulatory Visit: Payer: Self-pay | Admitting: Internal Medicine

## 2012-11-09 DIAGNOSIS — R109 Unspecified abdominal pain: Secondary | ICD-10-CM | POA: Diagnosis not present

## 2012-11-09 DIAGNOSIS — G459 Transient cerebral ischemic attack, unspecified: Secondary | ICD-10-CM | POA: Diagnosis not present

## 2012-11-09 DIAGNOSIS — Z0000 Encounter for general adult medical examination without abnormal findings: Secondary | ICD-10-CM | POA: Diagnosis not present

## 2012-11-09 DIAGNOSIS — E785 Hyperlipidemia, unspecified: Secondary | ICD-10-CM | POA: Diagnosis not present

## 2012-11-09 DIAGNOSIS — I10 Essential (primary) hypertension: Secondary | ICD-10-CM | POA: Diagnosis not present

## 2012-11-09 DIAGNOSIS — Z87898 Personal history of other specified conditions: Secondary | ICD-10-CM | POA: Diagnosis not present

## 2012-11-09 DIAGNOSIS — K222 Esophageal obstruction: Secondary | ICD-10-CM | POA: Diagnosis not present

## 2012-11-09 DIAGNOSIS — R319 Hematuria, unspecified: Secondary | ICD-10-CM | POA: Diagnosis not present

## 2012-11-11 ENCOUNTER — Ambulatory Visit
Admission: RE | Admit: 2012-11-11 | Discharge: 2012-11-11 | Disposition: A | Discharge status: Home / Self Care | Payer: Medicare Other | Source: Ambulatory Visit | Attending: Internal Medicine | Admitting: Internal Medicine

## 2012-11-11 DIAGNOSIS — K3189 Other diseases of stomach and duodenum: Secondary | ICD-10-CM | POA: Diagnosis not present

## 2012-11-11 DIAGNOSIS — R109 Unspecified abdominal pain: Secondary | ICD-10-CM

## 2012-11-11 MED ORDER — IOHEXOL 300 MG/ML  SOLN
100.0000 mL | Freq: Once | INTRAMUSCULAR | Status: AC | PRN
  Administered 2012-11-11: 100 mL via INTRAVENOUS

## 2012-11-16 DIAGNOSIS — Z780 Asymptomatic menopausal state: Secondary | ICD-10-CM | POA: Diagnosis not present

## 2012-11-23 DIAGNOSIS — Z1212 Encounter for screening for malignant neoplasm of rectum: Secondary | ICD-10-CM | POA: Diagnosis not present

## 2012-11-23 IMAGING — CT CT ABDOMEN W/ CM
2 of 5 series · 12 of 32 positions shown, 17 images · IV contrast (80ml omni 300)
Comparison: Multiple priors, most recently 12/23/2011.

CLINICAL DATA: Abdominal pain.

CT ABDOMEN WITH CONTRAST
TECHNIQUE: Multidetector CT imaging of the abdomen was performed
following the standard protocol during bolus administration of
intravenous contrast.
Contrast:  80 ml of Omnipaque 300.

[Series 2: routine abdomen · axial · 0.70mm/px · z∈[-272,-107]mm · 4 of 55 slices shown, 9 images]
[im 11/55  soft-tissue]
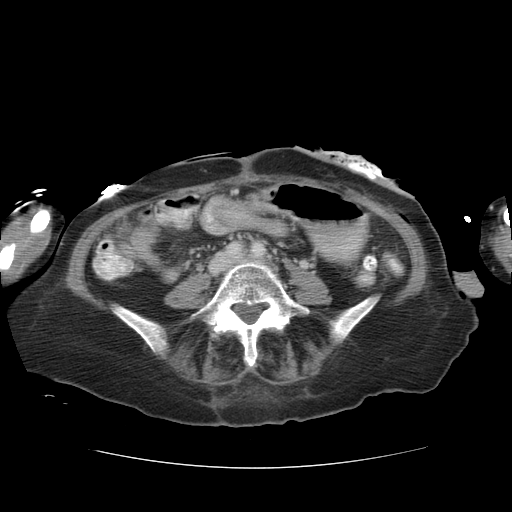
[im 11/55  lung]
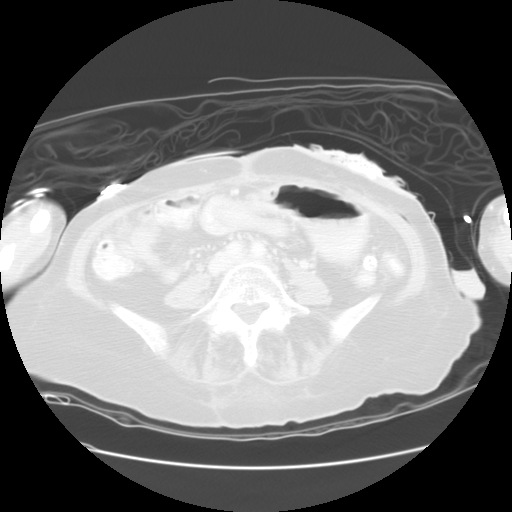
[im 11/55  bone]
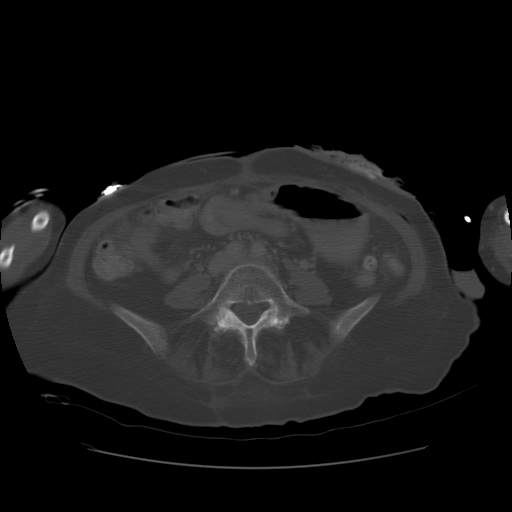
[im 22/55  soft-tissue]
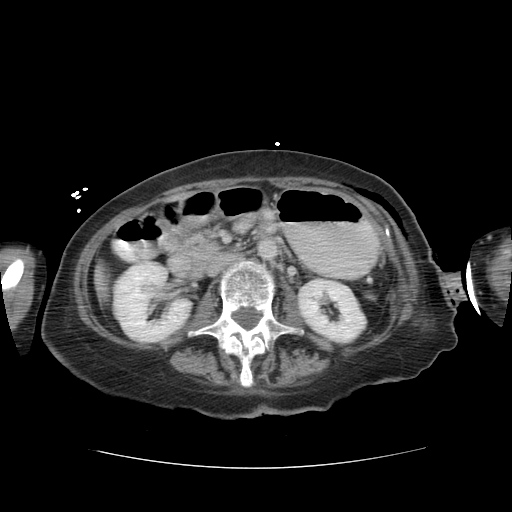
[im 22/55  lung]
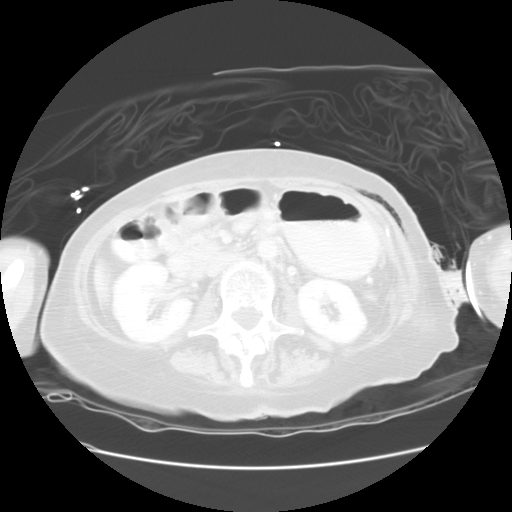
[im 33/55  soft-tissue]
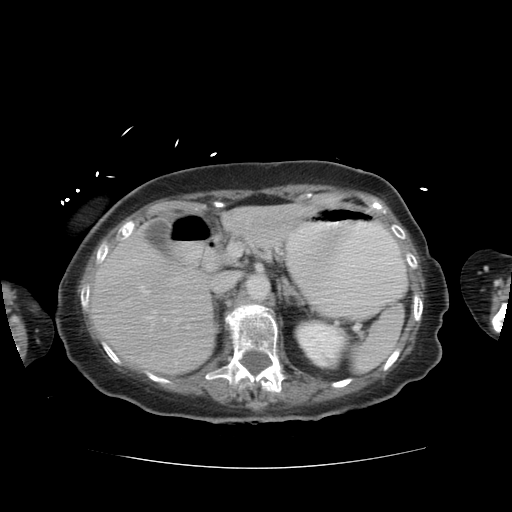
[im 33/55  lung]
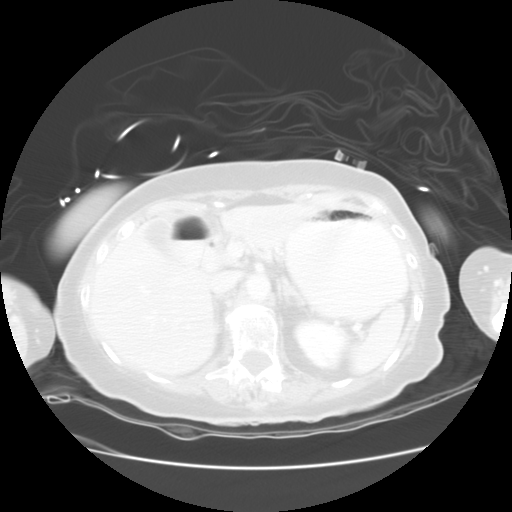
[im 44/55  soft-tissue]
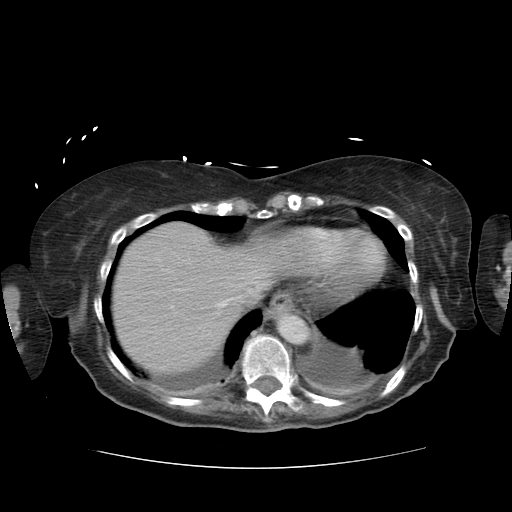
[im 44/55  lung]
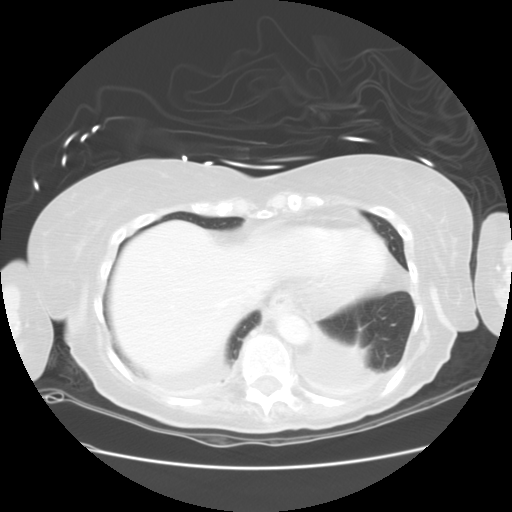

[Series 401: sagitals · sagittal · 0.70mm/px · 8 of 88 slices shown]
[im 9/88  soft-tissue]
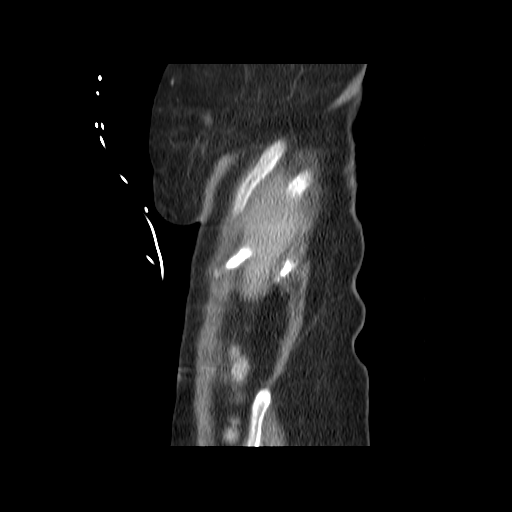
[im 18/88  soft-tissue]
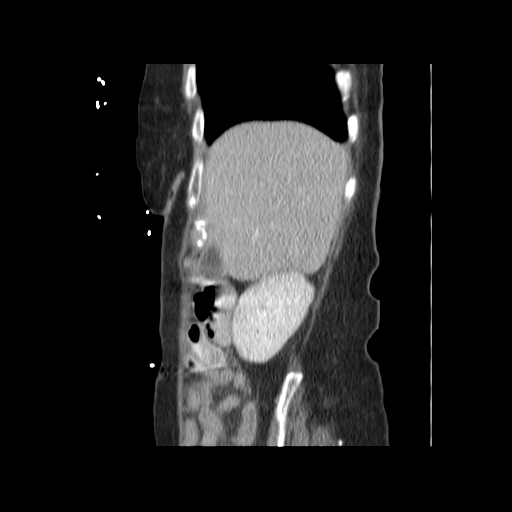
[im 27/88  soft-tissue]
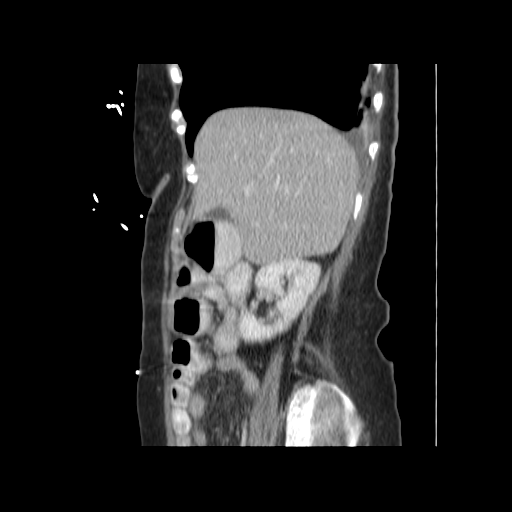
[im 35/88  soft-tissue]
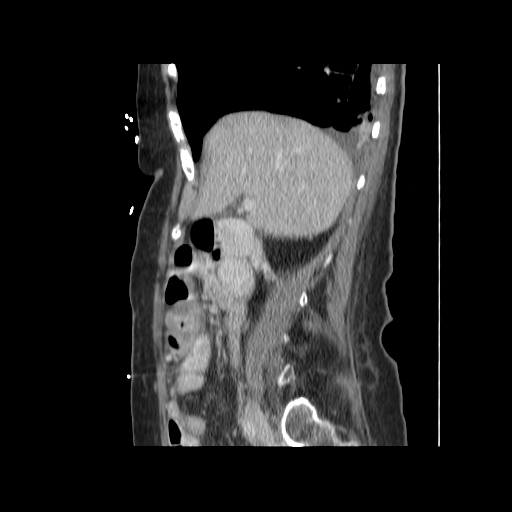
[im 53/88  soft-tissue]
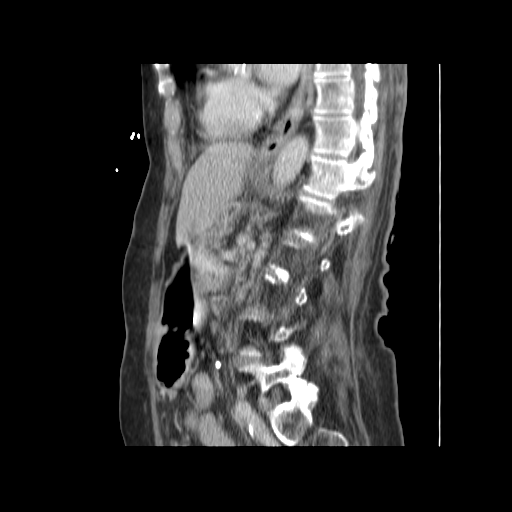
[im 61/88  soft-tissue]
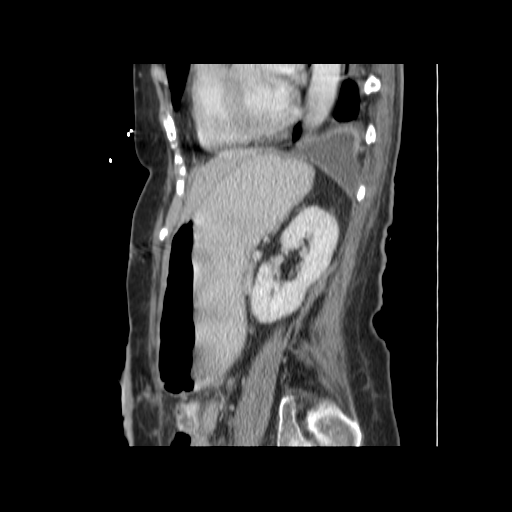
[im 70/88  soft-tissue]
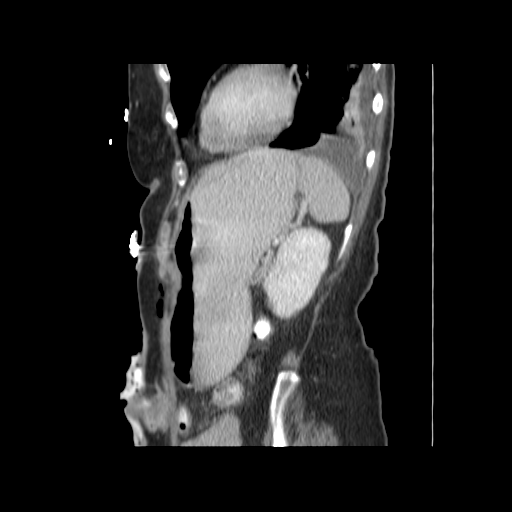
[im 79/88  soft-tissue]
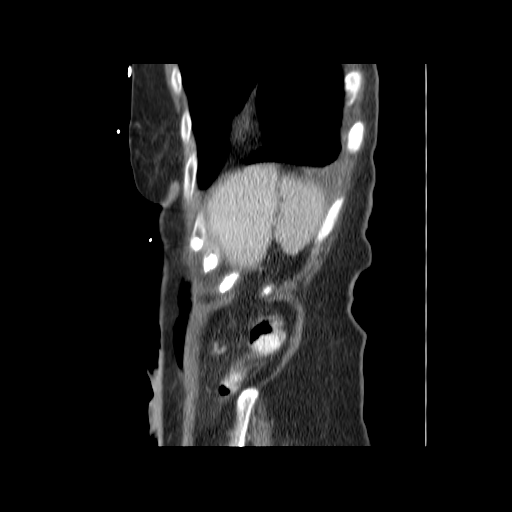

[12 of 32 positions shown; findings below may reference images not displayed]

FINDINGS: Lung Bases: Small bilateral pleural effusions (left greater than
right).  Dependent atelectasis in the lower lobes of the lungs
bilaterally.  Infusion catheter tip terminating in the superior
right atrium.

Abdomen:  Subcentimeter low attenuation lesions in the segments
seven and eight of the lumbar are unchanged and remain too small to
definitively characterize.  The enhanced appearance of the
gallbladder, pancreas, spleen and, bilateral adrenal glands and
bilateral kidneys is unremarkable.  Within the visualized abdomen
there is no ascites or pneumoperitoneum and no pathologic
distension of small bowel.  A colostomy is noted in the left lower
quadrant of the abdomen.  There is a small amount of gas in the
left anterior abdominal wall adjacent to the colostomy site,
decreased compared to the prior study from 12/23/2011.

Musculoskeletal: There are no aggressive appearing lytic or blastic
lesions noted in the visualized portions of the skeleton.
Multilevel degenerative disc disease, most grade L2-L3 where there
is 4 mm of retrolisthesis of L2 upon L3.  Multilevel facet
arthropathy in the lumbar spine.
IMPRESSION: 1.  Persistent gas tracking along the lower left anterior abdominal
wall.  This is of uncertain etiology and significance, but could to
suggest potential communication with bowel, or alternatively the
presence of gas-forming organisms within these soft tissues.
Regardless, this finding has decreased over the prior several
examinations. Additionally, there is no associated fluid collection
is identified at this time suggest residual abscess.
2.  No other potentially acute findings within the abdomen to
account for the patient's symptoms.
3. Small bilateral pleural effusions with mild dependent
atelectasis in the lower lobes of the lungs bilaterally.
4.  Additional incidental findings, as above.

## 2012-11-23 IMAGING — NM NM HEPATO W/GB/PHARM/[PERSON_NAME]
2 series · 12 of 12 positions shown · non-contrast
Comparison: None.

CLINICAL DATA: Abdominal pain.  Nausea and vomiting.

NUCLEAR MEDICINE HEPATOBILIARY IMAGING WITH GALLBLADDER EF
TECHNIQUE: Sequential images of the abdomen were obtained [DATE] minutes following intravenous administration of
radiopharmaceutical. After oral ingestion of Ensure, gallbladder
ejection fraction was determined.
Radiopharmaceutical:  5.0 mCi Ac-IIm Choletec

[he hepatobiliary · 3.43mm/px · 6 of 60 frames shown (1 of 2)]
[frame 6/60]
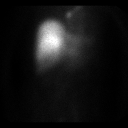
[frame 16/60]
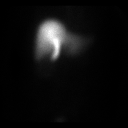
[frame 26/60]
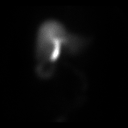
[frame 36/60]
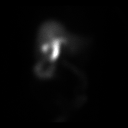
[frame 46/60]
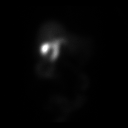
[frame 56/60]
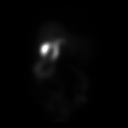

[he hepatobiliary · 3.43mm/px · 6 of 60 frames shown (2 of 2)]
[frame 6/60]
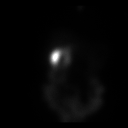
[frame 16/60]
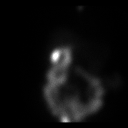
[frame 26/60]
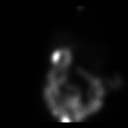
[frame 36/60]
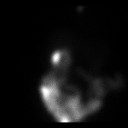
[frame 46/60]
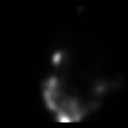
[frame 56/60]
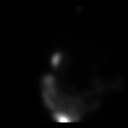

[12 of 12 positions shown; findings below may reference images not displayed]

FINDINGS: Prompt radiopharmaceutical uptake by the liver is seen.
The liver is normal appearance. There is prompt biliary excretion
activity.

Biliary activity is seen within the small bowel initially on the 25-
minute image.  Gallbladder activity is seen initially on the 35-
minute image and shows progressive filling.

Gallbladder ejection fraction:  33%. Normal gallbladder ejection
fraction with Ensure is greater than 33%.

The patient did not experience symptoms after oral ingestion of
Ensure.
IMPRESSION: 1. Negative hepatobiliary scan.  No evidence of cystic duct or
biliary obstruction.
2. Gallbladder ejection fraction of 33% is at the lower limit of
normal.

## 2012-11-27 DIAGNOSIS — Z23 Encounter for immunization: Secondary | ICD-10-CM | POA: Diagnosis not present

## 2012-12-14 ENCOUNTER — Other Ambulatory Visit: Payer: Self-pay | Admitting: Medical Oncology

## 2012-12-14 MED ORDER — FENTANYL 12 MCG/HR TD PT72: 1.0000 | MEDICATED_PATCH | TRANSDERMAL | Status: DC

## 2012-12-14 NOTE — Telephone Encounter (Signed)
Patient LVMOM requesting refill of fentanyl. Per MD, ok to refill. Attempted to call pt, LVMOM to inform her.

## 2012-12-15 ENCOUNTER — Telehealth: Payer: Self-pay | Admitting: Medical Oncology

## 2012-12-15 NOTE — Telephone Encounter (Signed)
Patient LVMOM stating she will p.u. Fentanyl prescription Thursday 12/16/12.

## 2012-12-22 DIAGNOSIS — K219 Gastro-esophageal reflux disease without esophagitis: Secondary | ICD-10-CM | POA: Diagnosis not present

## 2012-12-22 DIAGNOSIS — IMO0002 Reserved for concepts with insufficient information to code with codable children: Secondary | ICD-10-CM | POA: Diagnosis not present

## 2012-12-22 DIAGNOSIS — M810 Age-related osteoporosis without current pathological fracture: Secondary | ICD-10-CM | POA: Diagnosis not present

## 2012-12-29 ENCOUNTER — Telehealth: Payer: Self-pay | Admitting: Oncology

## 2012-12-29 ENCOUNTER — Other Ambulatory Visit: Payer: Medicare Other | Admitting: Lab

## 2012-12-29 ENCOUNTER — Ambulatory Visit: Payer: Medicare Other | Admitting: Oncology

## 2012-12-31 ENCOUNTER — Ambulatory Visit (HOSPITAL_BASED_OUTPATIENT_CLINIC_OR_DEPARTMENT_OTHER): Payer: Medicare Other | Admitting: Family

## 2012-12-31 ENCOUNTER — Other Ambulatory Visit (HOSPITAL_BASED_OUTPATIENT_CLINIC_OR_DEPARTMENT_OTHER): Payer: Medicare Other | Admitting: Lab

## 2012-12-31 ENCOUNTER — Encounter (INDEPENDENT_AMBULATORY_CARE_PROVIDER_SITE_OTHER): Payer: Self-pay

## 2012-12-31 ENCOUNTER — Telehealth: Payer: Self-pay | Admitting: *Deleted

## 2012-12-31 ENCOUNTER — Encounter: Payer: Self-pay | Admitting: Family

## 2012-12-31 VITALS — BP 145/63 | HR 51 | Temp 98.0°F | Resp 19 | Ht 59.0 in | Wt 97.8 lb

## 2012-12-31 DIAGNOSIS — C8298 Follicular lymphoma, unspecified, lymph nodes of multiple sites: Secondary | ICD-10-CM

## 2012-12-31 DIAGNOSIS — C8280 Other types of follicular lymphoma, unspecified site: Secondary | ICD-10-CM

## 2012-12-31 DIAGNOSIS — G8929 Other chronic pain: Secondary | ICD-10-CM | POA: Diagnosis not present

## 2012-12-31 DIAGNOSIS — M810 Age-related osteoporosis without current pathological fracture: Secondary | ICD-10-CM | POA: Diagnosis not present

## 2012-12-31 DIAGNOSIS — C8299 Follicular lymphoma, unspecified, extranodal and solid organ sites: Secondary | ICD-10-CM | POA: Diagnosis not present

## 2012-12-31 DIAGNOSIS — R109 Unspecified abdominal pain: Secondary | ICD-10-CM

## 2012-12-31 LAB — CBC WITH DIFFERENTIAL/PLATELET
BASO%: 0.8 % (ref 0.0–2.0)
Basophils Absolute: 0 10*3/uL (ref 0.0–0.1)
Eosinophils Absolute: 0.2 10*3/uL (ref 0.0–0.5)
HCT: 37 % (ref 34.8–46.6)
HGB: 12.5 g/dL (ref 11.6–15.9)
LYMPH%: 23.9 % (ref 14.0–49.7)
MCHC: 33.8 g/dL (ref 31.5–36.0)
MONO#: 0.6 10*3/uL (ref 0.1–0.9)
NEUT%: 62.5 % (ref 38.4–76.8)
Platelets: 239 10*3/uL (ref 145–400)
WBC: 6.3 10*3/uL (ref 3.9–10.3)
lymph#: 1.5 10*3/uL (ref 0.9–3.3)

## 2012-12-31 LAB — COMPREHENSIVE METABOLIC PANEL (CC13)
AST: 18 U/L (ref 5–34)
Albumin: 4.3 g/dL (ref 3.5–5.0)
Anion Gap: 9 mEq/L (ref 3–11)
BUN: 24.7 mg/dL (ref 7.0–26.0)
CO2: 23 mEq/L (ref 22–29)
Calcium: 10 mg/dL (ref 8.4–10.4)
Chloride: 107 mEq/L (ref 98–109)
Creatinine: 1.1 mg/dL (ref 0.6–1.1)
Glucose: 97 mg/dl (ref 70–140)
Potassium: 4.5 mEq/L (ref 3.5–5.1)

## 2012-12-31 MED ORDER — HYDROCODONE-ACETAMINOPHEN 5-325 MG PO TABS: 1.0000 | ORAL_TABLET | Freq: Four times a day (QID) | ORAL | Status: DC | PRN

## 2012-12-31 MED ORDER — FENTANYL 12 MCG/HR TD PT72: 1.0000 | MEDICATED_PATCH | TRANSDERMAL | Status: DC

## 2012-12-31 NOTE — Telephone Encounter (Signed)
appts made and printed...td 

## 2012-12-31 NOTE — Patient Instructions (Signed)
Please contact us at [phone] if you have any questions or concerns.  Please continue to do well and enjoy life!!!  Get plenty of rest, drink plenty of water, exercise daily (walking), eat a balanced diet.  Take calcium and vitamin D3 daily.   Complete monthly self-breast examinations.  Have a clinical breast exam by a physician every year.  Have your mammogram completed every year.  Results for orders placed in visit on 12/31/12 (from the past 24 hour(s))  CBC WITH DIFFERENTIAL     Status: None   Collection Time    12/31/12  1:25 PM      Result Value Range   WBC 6.3  3.9 - 10.3 10e3/uL   NEUT# 3.9  1.5 - 6.5 10e3/uL   HGB 12.5  11.6 - 15.9 g/dL   HCT 16.1  09.6 - 04.5 %   Platelets 239  145 - 400 10e3/uL   MCV 90.8  79.5 - 101.0 fL   MCH 30.7  25.1 - 34.0 pg   MCHC 33.8  31.5 - 36.0 g/dL   RBC 4.09  8.11 - 9.14 10e6/uL   RDW 13.0  11.2 - 14.5 %   lymph# 1.5  0.9 - 3.3 10e3/uL   MONO# 0.6  0.1 - 0.9 10e3/uL   Eosinophils Absolute 0.2  0.0 - 0.5 10e3/uL   Basophils Absolute 0.0  0.0 - 0.1 10e3/uL   NEUT% 62.5  38.4 - 76.8 %   LYMPH% 23.9  14.0 - 49.7 %   MONO% 9.9  0.0 - 14.0 %   EOS% 2.9  0.0 - 7.0 %   BASO% 0.8  0.0 - 2.0 %   Narrative:    Performed At:  [facility]               [address]               [place], [place] [address]  LACTATE DEHYDROGENASE (CC13)     Status: None   Collection Time    12/31/12  1:26 PM      Result Value Range   LDH 181  125 - 245 U/L   Narrative:    This order was split into 3orders: (CBC & Diff), (Comprehensive Metabolic Panel ), (LDH)Performed At:  [facility]               [address]               [place], [place] [address]  COMPREHENSIVE METABOLIC PANEL (CC13)     Status: None   Collection Time    12/31/12  1:26 PM      Result Value Range   Sodium 140  136 - 145 mEq/L   Potassium 4.5  3.5 - 5.1 mEq/L   Chloride 107  98 - 109 mEq/L   CO2 23  22 - 29 mEq/L   Glucose 97  70 - 140 mg/dl   BUN 86.5   7.0 - 78.4 mg/dL   Creatinine 1.1  0.6 - 1.1 mg/dL   Total Bilirubin 6.96  0.20 - 1.20 mg/dL   Alkaline Phosphatase 88  40 - 150 U/L   AST 18  5 - 34 U/L   ALT 13  0 - 55 U/L   Total Protein 6.9  6.4 - 8.3 g/dL   Albumin 4.3  3.5 - 5.0 g/dL   Calcium 29.5  8.4 - 28.4 mg/dL   Anion Gap 9  3 -  11 mEq/L   Narrative:    Performed At:  [facility]               [address]               [place], [place] [address]

## 2013-01-02 NOTE — Progress Notes (Signed)
[facility]  Telephone:(336) [phone] Fax:(336) [phone]  OFFICE PROGRESS NOTE   ID: [name]   DOB: [birth year]  MR#: [id number]  JYN#:[id number]   PCP: Alysia Penna, MD URO:  Tommie Ard, MD GI:   Bernette Redbird, MD SU:  Darnell Level, MD PHARMD:  Wynne Dust, PharmD  DIAGNOSIS: [name] is a 79 y.o. 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   PRIOR THERAPY: #1 Had a PET/CT performed in 07/2011 that revealed upper limit of normal sized axillary and subpectoral nodes, corresponding to hypermetabolism at PET.  CT of the abdomen and pelvis revealed pelvic adenopathy, consistent with active disease. Equivocal abdominal retroperitoneal findings. Borderline adenopathy without hypermetabolism superiorly. A small more inferior retroperitoneal node demonstrated mild nonspecific hypermetabolism at PET. Scattered liver lesions, technically too small to characterize but most likely benign cysts or hemangiomas.  #2 Received systemic chemotherapy with CVP/Rituxan combination given every 21 days for a total of 3 of 4 cycles for symptomatic lymphoma from 09/05/2011 - 11/14/2011   #3 Bowel perforation in 11/2011 requiring emergent colonic resection surgery followed by prolonged intensive care unit stay.  She is currently living at [facility] and [facility].    #4 Chronic lower back/hip and abdominal pain on Fentanyl patches.   CURRENT THERAPY: Observation/pain management   INTERVAL HISTORY: [name] is a 79 y.o. female who returns for followup visit.  She is accompanied for today's office visit by her husband Annette Stable.  Since her last office visit on 06/28/2012 she received a bone density scan in approximately 3 weeks ago in which she was told she had extreme osteoporosis.  Subsequently she was seen by Dr. Wynne Dust, Pharm.D. who started [name] on bisphosphonate therapy.  She also had a CT of the abdomen and pelvis on 11/11/2012  which showed no osteoblastic or osteolytic lesions.  There were significant degenerative changes of the visualized spine, which were stable.  No pathologically enlarged lymph nodes were seen, left lower quadrant colostomy without evidence of strangulation or incarceration.  She continues to make good progress and is ambulating with a 4-pronged cane. She states she was seen by Dr. Gerrit Friends who does not intend on reversing her colostomy.  She has complaints of chronic back, hip and pelvic area pain which is relieved when she lies flat.   Her interval history is otherwise stable.   MEDICAL HISTORY: Past Medical History  Diagnosis Date  . Cancer 07/08/11    follicular B cell lymphoma  . TIA (transient ischemic attack)   . Arthritis   . Hypertension   . Esophageal abnormality     strictures  . Arthritis 09/04/2011  . Dehydration 11/20/2011  . Diarrhea 11/20/2011  . Complication of anesthesia   . PONV (postoperative nausea and vomiting)     in the 1980's  . GERD (gastroesophageal reflux disease)   . Esophageal stricture     x 2  . Septic shock(785.52) 11/22/2011  . Candida esophagitis 01/06/2012  . Diverticulitis of colon with perforation 11/21/2011  . Osteoporosis     ALLERGIES:   Allergies  Allergen Reactions  . Codeine Hives  . Other Nausea And Vomiting    anesthesia  . Penicillins Hives  . Sulfa Antibiotics Hives    MEDICATIONS:  Current Outpatient Prescriptions  Medication Sig Dispense Refill  . amLODipine-atorvastatin (CADUET) 5-40 MG per tablet Take 1 tablet by mouth daily.       . clopidogrel (PLAVIX) 75 MG tablet Take 75 mg by mouth  daily.      . fentaNYL (DURAGESIC - DOSED MCG/HR) 12 MCG/HR Place 1 patch (12.5 mcg total) onto the skin every 3 (three) days.  10 patch  0  . megestrol (MEGACE) 40 MG/ML suspension Take 400 mg by mouth daily as needed.       . valsartan (DIOVAN) 160 MG tablet Take 160 mg by mouth daily.       [name] ZETIA 10 MG tablet Take 10 mg by mouth daily.         [name] HYDROcodone-acetaminophen (NORCO/VICODIN) 5-325 MG per tablet Take 1-2 tablets by mouth every 6 (six) hours as needed.  30 tablet  0  . Olopatadine HCl (PATADAY) 0.2 % SOLN Apply 1 drop to eye daily.      . ondansetron (ZOFRAN) 4 MG tablet Take 1 tablet (4 mg total) by mouth every 6 (six) hours as needed for nausea.  45 tablet  0   No current facility-administered medications for this visit.    SURGICAL HISTORY:  Past Surgical History  Procedure Laterality Date  . Appendectomy    . Parotidectomy w/ neck dissection total    . Tonsillectomy    . Abdominal hysterectomy      left ovaries  . Laparotomy  11/22/2011    Procedure: EXPLORATORY LAPAROTOMY;  Surgeon: Velora Heckler, MD;  Location: [facility];  Service: General;  Laterality: N/A;  drainage of pelvic abscess  . Colostomy revision  11/22/2011    Procedure: COLON RESECTION SIGMOID;  Surgeon: Velora Heckler, MD;  Location: [facility];  Service: General;  Laterality: N/A;  . Colostomy  11/22/2011    Procedure: COLOSTOMY;  Surgeon: Velora Heckler, MD;  Location: [facility];  Service: General;  Laterality: N/A;  desending colostomy  . Esophagogastroduodenoscopy  01/06/2012    Procedure: ESOPHAGOGASTRODUODENOSCOPY (EGD);  Surgeon: Rachael Fee, MD;  Location: [facility];  Service: Endoscopy;  Laterality: N/A;    REVIEW OF SYSTEMS:  Pertinent items are noted in HPI.   A 10 point review of systems was completed and is negative except as previously noted.  [name] denies any other symptomatology including fatigue, fever or chills, headache, vision changes, swollen glands, cough or shortness of breath, chest pain or discomfort, nausea, vomiting, diarrhea, constipation, change in urinary or bowel habits, arthralgias/myalgias, unusual bleeding/bruising or any other symptomatology.  PHYSICAL EXAMINATION:  Blood pressure 145/63, pulse 51, temperature 98 F (36.7 C), temperature source Oral, resp. rate 19, height 4\' 11"  (1.499 m), weight 97 lb 12.8 oz  (44.362 kg).  ECOG FS: 1 - Symptomatic but completely ambulatory  General appearance: Alert, cooperative, thin frame, frail appearance, no apparent distress Head: Normocephalic, without obvious abnormality, atraumatic Eyes: Arcus senilis, PERRLA, EOMI Nose: Nares, septum and mucosa are normal, no drainage or sinus tenderness Neck: No adenopathy, supple, symmetrical, trachea midline, no tenderness Resp: Clear to auscultation bilaterally, no wheezes/rales/rhonchi Cardio: Regular rate and rhythm, S1, S2 normal, no murmur, click, rub or gallop, no edema GI: Abdominal hernia by LLQ colostomy, stoma has beefy pink color and moist, normoactive bowel sounds, no organomegaly, diffuse abdominal pain Skin: No rashes/lesions, skin warm and dry, no erythematous areas, no cyanosis  M/S:  Atraumatic, limited strength in all extremities, limited range of motion, no clubbing, diffuse skeletal pain  Lymph nodes: Cervical, supraclavicular, and axillary nodes normal Neurologic: Ambulates with a cane, cranial nerves II through XII intact, alert and oriented x 3 Psych: Appropriate affect   LABORATORY DATA: Lab Results  Component Value Date  WBC 6.3 12/31/2012   HGB 12.5 12/31/2012   HCT 37.0 12/31/2012   MCV 90.8 12/31/2012   PLT 239 12/31/2012      Chemistry      Component Value Date/Time   NA 140 12/31/2012 1326   NA 137 04/19/2012 0655   K 4.5 12/31/2012 1326   K 3.6 04/19/2012 0655   CL 106 06/28/2012 1131   CL 106 04/19/2012 0655   CO2 23 12/31/2012 1326   CO2 24 04/19/2012 0655   BUN 24.7 12/31/2012 1326   BUN <3* 04/19/2012 0655   CREATININE 1.1 12/31/2012 1326   CREATININE 0.56 04/19/2012 0655      Component Value Date/Time   CALCIUM 10.0 12/31/2012 1326   CALCIUM 8.1* 04/19/2012 0655   ALKPHOS 88 12/31/2012 1326   ALKPHOS 42 04/15/2012 0541   AST 18 12/31/2012 1326   AST 18 04/15/2012 0541   ALT 13 12/31/2012 1326   ALT 9 04/15/2012 0541   BILITOT 0.86 12/31/2012 1326   BILITOT 0.4  04/15/2012 0541      RADIOGRAPHIC STUDIES: Ct Abdomen Pelvis W Contrast 11/11/2012   *RADIOLOGY REPORT*  Clinical Data: Abdominal pain for 4-5 weeks.  The patient has a colostomy.  History of an appendectomy.  History of lymphoma, finishing chemotherapy in July 2013.  History of diverticulitis.  CT ABDOMEN AND PELVIS WITH CONTRAST  Technique:  Multidetector CT imaging of the abdomen and pelvis was performed following the standard protocol during bolus administration of intravenous contrast.  Contrast: OMNIPAQUE IOHEXOL 300 MG/ML  SOLN  Comparison: CT, 04/14/2012  Findings: There is a left lower quadrant colostomy.  The mostly decompressed descending colon extends into the colostomy.  A loop of the left transverse colon also enters the colostomy, but exits the colostomy defect to extend to the left upper quadrant.  This does not cause obstruction and there is no evidence of strangulation or incarceration.  The stomach is distended containing both contrast and nonopaque fluid.  The wall does not appear thickened.  The stomach was distended on the prior study to a similar degree.  The liver shows low density lesions are stable likely cysts.  The liver is otherwise unremarkable.  Normal spleen and gallbladder.  The pancreas is displaced anteriorly by a distended proximal duodenum, and is not vertically well seen on the study, but is grossly unremarkable.  No adrenal mass is seen.  The kidneys and ureters are unremarkable.  The bladder is only minimally distended but is otherwise unremarkable.  The uterus is surgically absent.  There are no pelvic masses.  No pathologically enlarged lymph nodes are seen.  There are no abnormal fluid collections.  The distal sigmoid colon terminates in the Hartmann's pouch in the presacral space, stable.  There are no osteoblastic or osteolytic lesions.  There are significant degenerative changes of the visualized spine, which are stable.  There is some minimal scarring at the  lung bases.  The lung bases are otherwise clear.  The heart is normal in size.  IMPRESSION: No convincing acute findings.  Significant distention of the stomach of unclear etiology.  This was present to a similar degree on the prior exam.  The left transverse colon enters the left lower quadrant colostomy along with the lower descending colon.  There is no bowel obstruction and there is no incarceration or strangulation within the hernia.  There are multiple chronic findings, stable from prior CT.   Original Report Authenticated By: Amie Portland, M.D.    ASSESSMENT:  Eufemia Prindle is a 79 y.o. 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 She is status post staging scans which revealed disease in the chest axilla as well as pelvic region. Patient is also symptomatic with abdominal and pelvic pain. Patient went on to receive 3 cycles of CVP/R.  #2 Status post bowel perforation with resection and colostomy in 11/2011. Now with possible herniation of stoma  #3 Chronic pain on Fentanyl patches and PO medication  #4  CT of abdomen and pelvis on 11/11/2012 which showed no osteoblastic or osteolytic lesions.  There were significant degenerative changes of the visualized spine, which were stable.  No pathologically enlarged lymph nodes were seen, left lower quadrant colostomy without evidence of strangulation or incarceration.    #5  Self reported newly diagnosed osteoporosis - Receiving bisphosphonate therapy.   PLAN:  #1 Patient will receive Fentanyl patch refills.  Given written prescription for 10 patches with 0 refills today.   A prescription was also written for Norco 5/325 mg take 1-2 tablets by mouth every 6 hours when necessary for pain #30 with 0 refills  #2 We plan to see the patient again in about 6 months for followup.  We will check laboratories of CBC and CMP at that time.    I will discuss the patient's plan of care with Dr. Welton Flakes upon her return to the office.  All  questions were answered.  Mr. And Mrs. Allyne Gee were encouraged to contact us in the interim with any questions, concerns or problems.   Larina Bras, NP-C 01/02/2013   6:03 PM

## 2013-01-04 ENCOUNTER — Other Ambulatory Visit: Payer: Self-pay | Admitting: Family

## 2013-01-04 ENCOUNTER — Telehealth: Payer: Self-pay | Admitting: Family

## 2013-01-04 DIAGNOSIS — C8280 Other types of follicular lymphoma, unspecified site: Secondary | ICD-10-CM

## 2013-01-04 NOTE — Telephone Encounter (Signed)
Spoke to Ms. [name] after speaking with Dr. Welton Flakes.  We will get Mrs. [name] scheduled for a whole body scan secondary to patient complaints of diffuse bony pain particularly in her hips, back, and pelvic area.  Mrs. [name] voiced understanding in agreement to imaging.

## 2013-01-06 ENCOUNTER — Telehealth: Payer: Self-pay | Admitting: Oncology

## 2013-01-06 NOTE — Telephone Encounter (Signed)
Rad dept will schedule the pt for the bone scan and then contact the pt with the appt.

## 2013-01-18 ENCOUNTER — Encounter (HOSPITAL_COMMUNITY)
Admission: RE | Admit: 2013-01-18 | Discharge: 2013-01-18 | Disposition: A | Discharge status: Home / Self Care | Payer: Medicare Other | Source: Ambulatory Visit | Attending: Family | Admitting: Family

## 2013-01-18 DIAGNOSIS — Z87898 Personal history of other specified conditions: Secondary | ICD-10-CM | POA: Insufficient documentation

## 2013-01-18 DIAGNOSIS — M899 Disorder of bone, unspecified: Secondary | ICD-10-CM | POA: Insufficient documentation

## 2013-01-18 DIAGNOSIS — C8280 Other types of follicular lymphoma, unspecified site: Secondary | ICD-10-CM

## 2013-01-18 MED ORDER — TECHNETIUM TC 99M MEDRONATE IV KIT
27.0000 | PACK | Freq: Once | INTRAVENOUS | Status: AC | PRN
  Administered 2013-01-18: 27 via INTRAVENOUS

## 2013-01-26 DIAGNOSIS — M810 Age-related osteoporosis without current pathological fracture: Secondary | ICD-10-CM | POA: Diagnosis not present

## 2013-02-01 ENCOUNTER — Encounter: Payer: Self-pay | Admitting: *Deleted

## 2013-02-01 ENCOUNTER — Encounter: Payer: Self-pay | Admitting: Oncology

## 2013-02-01 NOTE — Progress Notes (Signed)
RECEIVED A FAX FROM [facility] CONCERNING A PRIOR AUTHORIZATION FOR FENTANYL. THIS REQUEST WAS PLACED IN THE MANAGED CARE BIN.

## 2013-02-01 NOTE — Progress Notes (Signed)
Called [facility], [id number], for a fentanyl pa; per rep this doesn't require a pa.

## 2013-02-25 ENCOUNTER — Other Ambulatory Visit: Payer: Self-pay | Admitting: Emergency Medicine

## 2013-02-25 DIAGNOSIS — I10 Essential (primary) hypertension: Secondary | ICD-10-CM | POA: Diagnosis not present

## 2013-02-25 DIAGNOSIS — E785 Hyperlipidemia, unspecified: Secondary | ICD-10-CM | POA: Diagnosis not present

## 2013-02-25 DIAGNOSIS — R109 Unspecified abdominal pain: Secondary | ICD-10-CM | POA: Diagnosis not present

## 2013-02-25 DIAGNOSIS — IMO0002 Reserved for concepts with insufficient information to code with codable children: Secondary | ICD-10-CM | POA: Diagnosis not present

## 2013-02-25 DIAGNOSIS — C8280 Other types of follicular lymphoma, unspecified site: Secondary | ICD-10-CM

## 2013-02-25 MED ORDER — FENTANYL 12 MCG/HR TD PT72: 12.5000 ug | MEDICATED_PATCH | TRANSDERMAL | Status: DC

## 2013-04-04 ENCOUNTER — Other Ambulatory Visit: Payer: Self-pay | Admitting: *Deleted

## 2013-04-04 DIAGNOSIS — C8280 Other types of follicular lymphoma, unspecified site: Secondary | ICD-10-CM

## 2013-04-04 DIAGNOSIS — R109 Unspecified abdominal pain: Secondary | ICD-10-CM

## 2013-04-04 MED ORDER — FENTANYL 12 MCG/HR TD PT72: 12.5000 ug | MEDICATED_PATCH | TRANSDERMAL | Status: DC

## 2013-04-04 NOTE — Telephone Encounter (Signed)
Patient left voicemail for refill. Called and spoke with patient, she may pick up Rx after 9am tomorrow.

## 2013-05-05 ENCOUNTER — Other Ambulatory Visit: Payer: Self-pay | Admitting: *Deleted

## 2013-05-05 DIAGNOSIS — C8280 Other types of follicular lymphoma, unspecified site: Secondary | ICD-10-CM

## 2013-05-05 DIAGNOSIS — R109 Unspecified abdominal pain: Secondary | ICD-10-CM

## 2013-05-05 MED ORDER — FENTANYL 12 MCG/HR TD PT72: 12.5000 ug | MEDICATED_PATCH | TRANSDERMAL | Status: DC

## 2013-05-13 ENCOUNTER — Inpatient Hospital Stay (HOSPITAL_COMMUNITY)
Admission: EM | Admit: 2013-05-13 | Discharge: 2013-05-25 | DRG: 345 | Disposition: A | Discharge status: Skilled Nursing Facility | Payer: Medicare Other | Attending: Surgery | Admitting: Surgery

## 2013-05-13 ENCOUNTER — Emergency Department (HOSPITAL_COMMUNITY): Payer: Medicare Other

## 2013-05-13 ENCOUNTER — Encounter (HOSPITAL_COMMUNITY): Payer: Self-pay | Admitting: Emergency Medicine

## 2013-05-13 DIAGNOSIS — K2960 Other gastritis without bleeding: Secondary | ICD-10-CM | POA: Diagnosis present

## 2013-05-13 DIAGNOSIS — M47812 Spondylosis without myelopathy or radiculopathy, cervical region: Secondary | ICD-10-CM | POA: Diagnosis present

## 2013-05-13 DIAGNOSIS — E440 Moderate protein-calorie malnutrition: Secondary | ICD-10-CM | POA: Diagnosis not present

## 2013-05-13 DIAGNOSIS — K458 Other specified abdominal hernia without obstruction or gangrene: Secondary | ICD-10-CM | POA: Diagnosis not present

## 2013-05-13 DIAGNOSIS — K2990 Gastroduodenitis, unspecified, without bleeding: Secondary | ICD-10-CM | POA: Diagnosis not present

## 2013-05-13 DIAGNOSIS — M810 Age-related osteoporosis without current pathological fracture: Secondary | ICD-10-CM | POA: Diagnosis present

## 2013-05-13 DIAGNOSIS — Z433 Encounter for attention to colostomy: Secondary | ICD-10-CM | POA: Diagnosis not present

## 2013-05-13 DIAGNOSIS — E46 Unspecified protein-calorie malnutrition: Secondary | ICD-10-CM | POA: Diagnosis not present

## 2013-05-13 DIAGNOSIS — R1115 Cyclical vomiting syndrome unrelated to migraine: Secondary | ICD-10-CM | POA: Diagnosis not present

## 2013-05-13 DIAGNOSIS — K5732 Diverticulitis of large intestine without perforation or abscess without bleeding: Secondary | ICD-10-CM | POA: Diagnosis not present

## 2013-05-13 DIAGNOSIS — R1314 Dysphagia, pharyngoesophageal phase: Secondary | ICD-10-CM | POA: Diagnosis not present

## 2013-05-13 DIAGNOSIS — R109 Unspecified abdominal pain: Secondary | ICD-10-CM | POA: Diagnosis not present

## 2013-05-13 DIAGNOSIS — K5730 Diverticulosis of large intestine without perforation or abscess without bleeding: Secondary | ICD-10-CM | POA: Diagnosis not present

## 2013-05-13 DIAGNOSIS — K5710 Diverticulosis of small intestine without perforation or abscess without bleeding: Secondary | ICD-10-CM | POA: Diagnosis present

## 2013-05-13 DIAGNOSIS — Z8673 Personal history of transient ischemic attack (TIA), and cerebral infarction without residual deficits: Secondary | ICD-10-CM

## 2013-05-13 DIAGNOSIS — R112 Nausea with vomiting, unspecified: Secondary | ICD-10-CM | POA: Diagnosis not present

## 2013-05-13 DIAGNOSIS — Z9049 Acquired absence of other specified parts of digestive tract: Secondary | ICD-10-CM

## 2013-05-13 DIAGNOSIS — Q438 Other specified congenital malformations of intestine: Secondary | ICD-10-CM | POA: Diagnosis not present

## 2013-05-13 DIAGNOSIS — Z9089 Acquired absence of other organs: Secondary | ICD-10-CM

## 2013-05-13 DIAGNOSIS — M25519 Pain in unspecified shoulder: Secondary | ICD-10-CM | POA: Diagnosis present

## 2013-05-13 DIAGNOSIS — R279 Unspecified lack of coordination: Secondary | ICD-10-CM | POA: Diagnosis not present

## 2013-05-13 DIAGNOSIS — R10819 Abdominal tenderness, unspecified site: Secondary | ICD-10-CM | POA: Diagnosis not present

## 2013-05-13 DIAGNOSIS — K311 Adult hypertrophic pyloric stenosis: Secondary | ICD-10-CM | POA: Diagnosis not present

## 2013-05-13 DIAGNOSIS — C8280 Other types of follicular lymphoma, unspecified site: Secondary | ICD-10-CM | POA: Diagnosis present

## 2013-05-13 DIAGNOSIS — Z9221 Personal history of antineoplastic chemotherapy: Secondary | ICD-10-CM

## 2013-05-13 DIAGNOSIS — I5022 Chronic systolic (congestive) heart failure: Secondary | ICD-10-CM | POA: Diagnosis not present

## 2013-05-13 DIAGNOSIS — I509 Heart failure, unspecified: Secondary | ICD-10-CM | POA: Diagnosis present

## 2013-05-13 DIAGNOSIS — E876 Hypokalemia: Secondary | ICD-10-CM | POA: Diagnosis present

## 2013-05-13 DIAGNOSIS — M1990 Unspecified osteoarthritis, unspecified site: Secondary | ICD-10-CM | POA: Diagnosis not present

## 2013-05-13 DIAGNOSIS — I498 Other specified cardiac arrhythmias: Secondary | ICD-10-CM | POA: Diagnosis present

## 2013-05-13 DIAGNOSIS — R7309 Other abnormal glucose: Secondary | ICD-10-CM | POA: Diagnosis present

## 2013-05-13 DIAGNOSIS — IMO0002 Reserved for concepts with insufficient information to code with codable children: Principal | ICD-10-CM | POA: Diagnosis present

## 2013-05-13 DIAGNOSIS — I10 Essential (primary) hypertension: Secondary | ICD-10-CM | POA: Diagnosis present

## 2013-05-13 DIAGNOSIS — K460 Unspecified abdominal hernia with obstruction, without gangrene: Secondary | ICD-10-CM | POA: Diagnosis not present

## 2013-05-13 DIAGNOSIS — K436 Other and unspecified ventral hernia with obstruction, without gangrene: Secondary | ICD-10-CM | POA: Diagnosis not present

## 2013-05-13 DIAGNOSIS — M542 Cervicalgia: Secondary | ICD-10-CM | POA: Diagnosis present

## 2013-05-13 DIAGNOSIS — K2210 Ulcer of esophagus without bleeding: Secondary | ICD-10-CM | POA: Diagnosis not present

## 2013-05-13 DIAGNOSIS — C8589 Other specified types of non-Hodgkin lymphoma, extranodal and solid organ sites: Secondary | ICD-10-CM | POA: Diagnosis not present

## 2013-05-13 DIAGNOSIS — R1084 Generalized abdominal pain: Secondary | ICD-10-CM | POA: Diagnosis not present

## 2013-05-13 DIAGNOSIS — R29898 Other symptoms and signs involving the musculoskeletal system: Secondary | ICD-10-CM | POA: Diagnosis present

## 2013-05-13 DIAGNOSIS — C8299 Follicular lymphoma, unspecified, extranodal and solid organ sites: Secondary | ICD-10-CM | POA: Diagnosis not present

## 2013-05-13 DIAGNOSIS — M6281 Muscle weakness (generalized): Secondary | ICD-10-CM | POA: Diagnosis not present

## 2013-05-13 DIAGNOSIS — I4891 Unspecified atrial fibrillation: Secondary | ICD-10-CM | POA: Diagnosis not present

## 2013-05-13 DIAGNOSIS — K2970 Gastritis, unspecified, without bleeding: Secondary | ICD-10-CM | POA: Diagnosis not present

## 2013-05-13 DIAGNOSIS — K9409 Other complications of colostomy: Secondary | ICD-10-CM | POA: Diagnosis present

## 2013-05-13 DIAGNOSIS — R209 Unspecified disturbances of skin sensation: Secondary | ICD-10-CM | POA: Diagnosis not present

## 2013-05-13 DIAGNOSIS — Z48815 Encounter for surgical aftercare following surgery on the digestive system: Secondary | ICD-10-CM | POA: Diagnosis not present

## 2013-05-13 DIAGNOSIS — Z8249 Family history of ischemic heart disease and other diseases of the circulatory system: Secondary | ICD-10-CM

## 2013-05-13 DIAGNOSIS — R269 Unspecified abnormalities of gait and mobility: Secondary | ICD-10-CM | POA: Diagnosis not present

## 2013-05-13 DIAGNOSIS — K4190 Unilateral femoral hernia, without obstruction or gangrene, not specified as recurrent: Secondary | ICD-10-CM | POA: Diagnosis not present

## 2013-05-13 DIAGNOSIS — C801 Malignant (primary) neoplasm, unspecified: Secondary | ICD-10-CM | POA: Diagnosis not present

## 2013-05-13 DIAGNOSIS — K435 Parastomal hernia without obstruction or  gangrene: Secondary | ICD-10-CM | POA: Diagnosis present

## 2013-05-13 DIAGNOSIS — K433 Parastomal hernia with obstruction, without gangrene: Secondary | ICD-10-CM

## 2013-05-13 HISTORY — DX: Cardiac arrhythmia, unspecified: I49.9

## 2013-05-13 HISTORY — DX: Essential (primary) hypertension: I10

## 2013-05-13 LAB — I-STAT CHEM 8, ED
BUN: 19 mg/dL (ref 6–23)
CALCIUM ION: 1.1 mmol/L — AB (ref 1.13–1.30)
CHLORIDE: 105 meq/L (ref 96–112)
CREATININE: 0.8 mg/dL (ref 0.50–1.10)
GLUCOSE: 117 mg/dL — AB (ref 70–99)
HCT: 40 % (ref 36.0–46.0)
Hemoglobin: 13.6 g/dL (ref 12.0–15.0)
Potassium: 3 mEq/L — ABNORMAL LOW (ref 3.7–5.3)
Sodium: 145 mEq/L (ref 137–147)
TCO2: 24 mmol/L (ref 0–100)

## 2013-05-13 LAB — CBC
HCT: 38.4 % (ref 36.0–46.0)
Hemoglobin: 13.1 g/dL (ref 12.0–15.0)
MCH: 30.5 pg (ref 26.0–34.0)
MCHC: 34.1 g/dL (ref 30.0–36.0)
MCV: 89.5 fL (ref 78.0–100.0)
PLATELETS: 235 10*3/uL (ref 150–400)
RBC: 4.29 MIL/uL (ref 3.87–5.11)
RDW: 13.2 % (ref 11.5–15.5)
WBC: 7.7 10*3/uL (ref 4.0–10.5)

## 2013-05-13 MED ORDER — FENTANYL CITRATE 0.05 MG/ML IJ SOLN
50.0000 ug | INTRAMUSCULAR | Status: DC | PRN
  Administered 2013-05-14 – 2013-05-19 (×3): 50 ug via INTRAVENOUS
  Filled 2013-05-13 (×3): qty 2

## 2013-05-13 MED ORDER — HYDROMORPHONE HCL PF 1 MG/ML IJ SOLN
1.0000 mg | Freq: Once | INTRAMUSCULAR | Status: AC
  Administered 2013-05-13: 1 mg via INTRAVENOUS
  Filled 2013-05-13: qty 1

## 2013-05-13 MED ORDER — ONDANSETRON HCL 4 MG/2ML IJ SOLN: 4.0000 mg | Freq: Once | INTRAMUSCULAR | Status: DC

## 2013-05-13 MED ORDER — ONDANSETRON HCL 4 MG/2ML IJ SOLN
4.0000 mg | Freq: Once | INTRAMUSCULAR | Status: AC
  Administered 2013-05-13: 4 mg via INTRAVENOUS
  Filled 2013-05-13: qty 2

## 2013-05-13 MED ORDER — IOHEXOL 300 MG/ML  SOLN
50.0000 mL | Freq: Once | INTRAMUSCULAR | Status: AC | PRN
  Administered 2013-05-13: 50 mL via ORAL

## 2013-05-13 NOTE — ED Notes (Signed)
Per EMS pt from home with upper abdominal pain for past 3 hours. Pt says pain is "familiar to me but this is the worse it has ever been." Cancer pt with complication from chemo to GI system. BP 142/84, HR 92. Pain is 8/10. Zofran 4mg  and Fentanyl 10mcg with little relief.  20 gauge in left forearm. Pt has Fentanyl patch on from home.

## 2013-05-14 ENCOUNTER — Encounter (HOSPITAL_COMMUNITY): Payer: Self-pay | Admitting: Internal Medicine

## 2013-05-14 DIAGNOSIS — IMO0002 Reserved for concepts with insufficient information to code with codable children: Secondary | ICD-10-CM

## 2013-05-14 DIAGNOSIS — R109 Unspecified abdominal pain: Secondary | ICD-10-CM

## 2013-05-14 DIAGNOSIS — I4891 Unspecified atrial fibrillation: Secondary | ICD-10-CM

## 2013-05-14 DIAGNOSIS — K9409 Other complications of colostomy: Secondary | ICD-10-CM | POA: Diagnosis present

## 2013-05-14 DIAGNOSIS — K460 Unspecified abdominal hernia with obstruction, without gangrene: Secondary | ICD-10-CM

## 2013-05-14 DIAGNOSIS — K311 Adult hypertrophic pyloric stenosis: Secondary | ICD-10-CM

## 2013-05-14 DIAGNOSIS — E876 Hypokalemia: Secondary | ICD-10-CM

## 2013-05-14 LAB — COMPREHENSIVE METABOLIC PANEL
ALK PHOS: 41 U/L (ref 39–117)
ALT: 19 U/L (ref 0–35)
ALT: 17 U/L (ref 0–35)
AST: 22 U/L (ref 0–37)
AST: 21 U/L (ref 0–37)
Albumin: 4.7 g/dL (ref 3.5–5.2)
Albumin: 4.2 g/dL (ref 3.5–5.2)
Alkaline Phosphatase: 43 U/L (ref 39–117)
BILIRUBIN TOTAL: 0.5 mg/dL (ref 0.3–1.2)
BUN: 19 mg/dL (ref 6–23)
BUN: 15 mg/dL (ref 6–23)
CHLORIDE: 102 meq/L (ref 96–112)
CO2: 24 mEq/L (ref 19–32)
CO2: 24 meq/L (ref 19–32)
Calcium: 9.2 mg/dL (ref 8.4–10.5)
Calcium: 8.8 mg/dL (ref 8.4–10.5)
Chloride: 101 mEq/L (ref 96–112)
Creatinine, Ser: 0.81 mg/dL (ref 0.50–1.10)
Creatinine, Ser: 0.76 mg/dL (ref 0.50–1.10)
GFR calc Af Amer: >90 mL/min (ref >90)
GFR calc non Af Amer: 67 mL/min — ABNORMAL LOW (ref >90)
GFR calc non Af Amer: 78 mL/min — ABNORMAL LOW (ref >90)
GFR, EST AFRICAN AMERICAN: 78 mL/min — AB (ref >90)
Glucose, Bld: 119 mg/dL — ABNORMAL HIGH (ref 70–99)
Glucose, Bld: 132 mg/dL — ABNORMAL HIGH (ref 70–99)
POTASSIUM: 3.6 meq/L — AB (ref 3.7–5.3)
Potassium: 3.1 mEq/L — ABNORMAL LOW (ref 3.7–5.3)
SODIUM: 141 meq/L (ref 137–147)
Sodium: 139 mEq/L (ref 137–147)
TOTAL PROTEIN: 6.9 g/dL (ref 6.0–8.3)
Total Bilirubin: 0.6 mg/dL (ref 0.3–1.2)
Total Protein: 6.3 g/dL (ref 6.0–8.3)

## 2013-05-14 LAB — CBC
HCT: 37.3 % (ref 36.0–46.0)
Hemoglobin: 12.6 g/dL (ref 12.0–15.0)
MCH: 30.4 pg (ref 26.0–34.0)
MCHC: 33.8 g/dL (ref 30.0–36.0)
MCV: 90.1 fL (ref 78.0–100.0)
PLATELETS: 244 10*3/uL (ref 150–400)
RBC: 4.14 MIL/uL (ref 3.87–5.11)
RDW: 13.2 % (ref 11.5–15.5)
WBC: 9.3 10*3/uL (ref 4.0–10.5)

## 2013-05-14 LAB — URINALYSIS, ROUTINE W REFLEX MICROSCOPIC
BILIRUBIN URINE: NEGATIVE
Glucose, UA: NEGATIVE mg/dL
Ketones, ur: NEGATIVE mg/dL
Leukocytes, UA: NEGATIVE
NITRITE: NEGATIVE
PH: 8 (ref 5.0–8.0)
Protein, ur: NEGATIVE mg/dL
SPECIFIC GRAVITY, URINE: 1.011 (ref 1.005–1.030)
Urobilinogen, UA: 0.2 mg/dL (ref 0.0–1.0)

## 2013-05-14 LAB — MAGNESIUM: MAGNESIUM: 2.2 mg/dL (ref 1.5–2.5)

## 2013-05-14 LAB — LIPASE, BLOOD: LIPASE: 66 U/L — AB (ref 11–59)

## 2013-05-14 LAB — PHOSPHORUS: PHOSPHORUS: 2.6 mg/dL (ref 2.3–4.6)

## 2013-05-14 LAB — URINE MICROSCOPIC-ADD ON

## 2013-05-14 MED ORDER — PHENOL 1.4 % MT LIQD
1.0000 | OROMUCOSAL | Status: DC | PRN
  Administered 2013-05-14: 1 via OROMUCOSAL
  Filled 2013-05-14: qty 177

## 2013-05-14 MED ORDER — IOHEXOL 300 MG/ML  SOLN
80.0000 mL | Freq: Once | INTRAMUSCULAR | Status: AC | PRN
  Administered 2013-05-14: 80 mL via INTRAVENOUS

## 2013-05-14 MED ORDER — POTASSIUM CHLORIDE 10 MEQ/100ML IV SOLN
10.0000 meq | Freq: Once | INTRAVENOUS | Status: AC
  Administered 2013-05-14: 10 meq via INTRAVENOUS
  Filled 2013-05-14: qty 100

## 2013-05-14 MED ORDER — ONDANSETRON HCL 4 MG/2ML IJ SOLN
4.0000 mg | Freq: Four times a day (QID) | INTRAMUSCULAR | Status: DC | PRN
  Administered 2013-05-22: 4 mg via INTRAVENOUS
  Filled 2013-05-14: qty 2

## 2013-05-14 MED ORDER — MORPHINE SULFATE 2 MG/ML IJ SOLN
2.0000 mg | INTRAMUSCULAR | Status: DC | PRN
  Administered 2013-05-15 – 2013-05-16 (×4): 2 mg via INTRAVENOUS
  Filled 2013-05-14 (×4): qty 1

## 2013-05-14 MED ORDER — METOPROLOL TARTRATE 1 MG/ML IV SOLN
2.5000 mg | Freq: Four times a day (QID) | INTRAVENOUS | Status: DC
  Administered 2013-05-14 – 2013-05-15 (×2): 2.5 mg via INTRAVENOUS
  Filled 2013-05-14 (×7): qty 5

## 2013-05-14 MED ORDER — ACETAMINOPHEN 325 MG PO TABS: 650.0000 mg | ORAL_TABLET | Freq: Four times a day (QID) | ORAL | Status: DC | PRN

## 2013-05-14 MED ORDER — SODIUM CHLORIDE 0.9 % IV SOLN
INTRAVENOUS | Status: DC
Start: 2013-05-14 — End: 2013-05-15
  Administered 2013-05-14 – 2013-05-15 (×3): via INTRAVENOUS

## 2013-05-14 MED ORDER — SODIUM CHLORIDE 0.9 % IV SOLN
INTRAVENOUS | Status: DC
  Administered 2013-05-14: 02:00:00 via INTRAVENOUS

## 2013-05-14 MED ORDER — POTASSIUM CHLORIDE 10 MEQ/100ML IV SOLN
10.0000 meq | INTRAVENOUS | Status: AC
  Administered 2013-05-14 (×4): 10 meq via INTRAVENOUS
  Filled 2013-05-14 (×6): qty 100

## 2013-05-14 MED ORDER — FENTANYL 12 MCG/HR TD PT72
12.5000 ug | MEDICATED_PATCH | TRANSDERMAL | Status: DC
  Administered 2013-05-16 – 2013-05-18 (×2): 12.5 ug via TRANSDERMAL
  Filled 2013-05-14: qty 1

## 2013-05-14 MED ORDER — ACETAMINOPHEN 650 MG RE SUPP: 650.0000 mg | Freq: Four times a day (QID) | RECTAL | Status: DC | PRN

## 2013-05-14 MED ORDER — SODIUM CHLORIDE 0.9 % IJ SOLN
3.0000 mL | Freq: Two times a day (BID) | INTRAMUSCULAR | Status: DC
  Administered 2013-05-20 – 2013-05-22 (×3): 3 mL via INTRAVENOUS

## 2013-05-14 MED ORDER — PANTOPRAZOLE SODIUM 40 MG IV SOLR
40.0000 mg | Freq: Every day | INTRAVENOUS | Status: DC
  Administered 2013-05-14 – 2013-05-17 (×4): 40 mg via INTRAVENOUS
  Filled 2013-05-14 (×5): qty 40

## 2013-05-14 MED ORDER — ONDANSETRON HCL 4 MG PO TABS: 4.0000 mg | ORAL_TABLET | Freq: Four times a day (QID) | ORAL | Status: DC | PRN

## 2013-05-14 MED ORDER — MENTHOL 3 MG MT LOZG
1.0000 | LOZENGE | OROMUCOSAL | Status: DC | PRN
  Administered 2013-05-15: 3 mg via ORAL
  Filled 2013-05-14: qty 9

## 2013-05-14 NOTE — Consult Note (Signed)
General Surgery [facility]  Reason for Consult:  Abdominal pain, parastomal hernia with obstruction  Referring Physician: Dr. Teressa Lower, ER, [facility]  [name] is an 80 y.o. female.   HPI: patient is a 80 yo WF well known to my practice.  History of Hartmann's procedure for perforated diverticular disease in 2013.  Prolonged post op recovery.  Now with parastomal hernia.  Two hospitalizations for abdominal pain and partial obstruction in 2014.  Now presents with abdominal pain and enlarging parastomal hernia - CT shows partial gastric herniation with obstruction.  NG decompression has relieved symptoms and hernia has reduced.  Discussed management with ER physician early this AM after reviewing CT scan.  I have seen and examined patient this morning and reviewed CT scan with patient's husband and son (physician).  She is much improved this AM.  Past Medical History  Diagnosis Date  . Cancer 2/95/62    follicular B cell lymphoma  . TIA (transient ischemic attack)   . Arthritis   . Hypertension   . Esophageal abnormality     strictures  . Arthritis 09/04/2011  . Dehydration 11/20/2011  . Diarrhea 11/20/2011  . Complication of anesthesia   . PONV (postoperative nausea and vomiting)     in the 1980's  . GERD (gastroesophageal reflux disease)   . Esophageal stricture     x 2  . Septic shock(785.52) 11/22/2011  . Candida esophagitis 01/06/2012  . Diverticulitis of colon with perforation 11/21/2011  . Osteoporosis     Past Surgical History  Procedure Laterality Date  . Appendectomy    . Parotidectomy w/ neck dissection total    . Tonsillectomy    . Abdominal hysterectomy      left ovaries  . Laparotomy  11/22/2011    Procedure: EXPLORATORY LAPAROTOMY;  Surgeon: Earnstine Regal, MD;  Location: [facility];  Service: General;  Laterality: N/A;  drainage of pelvic abscess  . Colostomy revision  11/22/2011    Procedure: COLON RESECTION SIGMOID;  Surgeon: Earnstine Regal, MD;  Location: [facility];  Service: General;  Laterality: N/A;  . Colostomy  11/22/2011    Procedure: COLOSTOMY;  Surgeon: Earnstine Regal, MD;  Location: [facility];  Service: General;  Laterality: N/A;  desending colostomy  . Esophagogastroduodenoscopy  01/06/2012    Procedure: ESOPHAGOGASTRODUODENOSCOPY (EGD);  Surgeon: Milus Banister, MD;  Location: [facility];  Service: Endoscopy;  Laterality: N/A;    Family History  Problem Relation Age of Onset  . Cancer Mother 33    gastric  . Hypertension Father     heart disease  . Cancer Maternal Aunt 65    breast cancer  . Cancer Maternal Uncle 53    colon    Social History:  reports that she has never smoked. She has never used smokeless tobacco. She reports that she does not drink alcohol or use illicit drugs.  Allergies:  Allergies  Allergen Reactions  . Codeine Hives  . Other Nausea And Vomiting    anesthesia  . Penicillins Hives  . Sulfa Antibiotics Hives    Medications: I have reviewed the patient's current medications.  Results for orders placed during the hospital encounter of 05/13/13 (from the past 48 hour(s))  CBC     Status: None   Collection Time    05/13/13  8:26 PM      Result Value Ref Range   WBC 7.7  4.0 - 10.5 K/uL   RBC 4.29  3.87 -  5.11 MIL/uL   Hemoglobin 13.1  12.0 - 15.0 g/dL   HCT 38.4  36.0 - 46.0 %   MCV 89.5  78.0 - 100.0 fL   MCH 30.5  26.0 - 34.0 pg   MCHC 34.1  30.0 - 36.0 g/dL   RDW 13.2  11.5 - 15.5 %   Platelets 235  150 - 400 K/uL  COMPREHENSIVE METABOLIC PANEL     Status: Abnormal   Collection Time    05/13/13  8:26 PM      Result Value Ref Range   Sodium 141  137 - 147 mEq/L   Potassium 3.1 (*) 3.7 - 5.3 mEq/L   Chloride 101  96 - 112 mEq/L   CO2 24  19 - 32 mEq/L   Glucose, Bld 119 (*) 70 - 99 mg/dL   BUN 19  6 - 23 mg/dL   Creatinine, Ser 0.81  0.50 - 1.10 mg/dL   Calcium 9.2  8.4 - 10.5 mg/dL   Total Protein 6.9  6.0 - 8.3 g/dL   Albumin 4.7  3.5 - 5.2 g/dL   AST 22  0 -  37 U/L   ALT 19  0 - 35 U/L   Alkaline Phosphatase 43  39 - 117 U/L   Total Bilirubin 0.6  0.3 - 1.2 mg/dL   GFR calc non Af Amer 67 (*) >90 mL/min   GFR calc Af Amer 78 (*) >90 mL/min   Comment: (NOTE)     The eGFR has been calculated using the CKD EPI equation.     This calculation has not been validated in all clinical situations.     eGFR's persistently <90 mL/min signify possible Chronic Kidney     Disease.  LIPASE, BLOOD     Status: Abnormal   Collection Time    05/13/13  8:26 PM      Result Value Ref Range   Lipase 66 (*) 11 - 59 U/L  I-STAT CHEM 8, ED     Status: Abnormal   Collection Time    05/13/13 11:33 PM      Result Value Ref Range   Sodium 145  137 - 147 mEq/L   Potassium 3.0 (*) 3.7 - 5.3 mEq/L   Chloride 105  96 - 112 mEq/L   BUN 19  6 - 23 mg/dL   Creatinine, Ser 0.80  0.50 - 1.10 mg/dL   Glucose, Bld 117 (*) 70 - 99 mg/dL   Calcium, Ion 1.10 (*) 1.13 - 1.30 mmol/L   TCO2 24  0 - 100 mmol/L   Hemoglobin 13.6  12.0 - 15.0 g/dL   HCT 40.0  36.0 - 46.0 %  URINALYSIS, ROUTINE W REFLEX MICROSCOPIC     Status: Abnormal   Collection Time    05/13/13 11:36 PM      Result Value Ref Range   Color, Urine YELLOW  YELLOW   APPearance CLEAR  CLEAR   Specific Gravity, Urine 1.011  1.005 - 1.030   pH 8.0  5.0 - 8.0   Glucose, UA NEGATIVE  NEGATIVE mg/dL   Hgb urine dipstick TRACE (*) NEGATIVE   Bilirubin Urine NEGATIVE  NEGATIVE   Ketones, ur NEGATIVE  NEGATIVE mg/dL   Protein, ur NEGATIVE  NEGATIVE mg/dL   Urobilinogen, UA 0.2  0.0 - 1.0 mg/dL   Nitrite NEGATIVE  NEGATIVE   Leukocytes, UA NEGATIVE  NEGATIVE  URINE MICROSCOPIC-ADD ON     Status: None   Collection Time    05/13/13   11:36 PM      Result Value Ref Range   Squamous Epithelial / LPF RARE  RARE   RBC / HPF 3-6  <3 RBC/hpf  MAGNESIUM     Status: None   Collection Time    05/14/13  5:15 AM      Result Value Ref Range   Magnesium 2.2  1.5 - 2.5 mg/dL  PHOSPHORUS     Status: None   Collection Time     05/14/13  5:15 AM      Result Value Ref Range   Phosphorus 2.6  2.3 - 4.6 mg/dL  COMPREHENSIVE METABOLIC PANEL     Status: Abnormal   Collection Time    05/14/13  5:15 AM      Result Value Ref Range   Sodium 139  137 - 147 mEq/L   Potassium 3.6 (*) 3.7 - 5.3 mEq/L   Chloride 102  96 - 112 mEq/L   CO2 24  19 - 32 mEq/L   Glucose, Bld 132 (*) 70 - 99 mg/dL   BUN 15  6 - 23 mg/dL   Creatinine, Ser 0.76  0.50 - 1.10 mg/dL   Calcium 8.8  8.4 - 10.5 mg/dL   Total Protein 6.3  6.0 - 8.3 g/dL   Albumin 4.2  3.5 - 5.2 g/dL   AST 21  0 - 37 U/L   ALT 17  0 - 35 U/L   Alkaline Phosphatase 41  39 - 117 U/L   Total Bilirubin 0.5  0.3 - 1.2 mg/dL   GFR calc non Af Amer 78 (*) >90 mL/min   GFR calc Af Amer >90  >90 mL/min   Comment: (NOTE)     The eGFR has been calculated using the CKD EPI equation.     This calculation has not been validated in all clinical situations.     eGFR's persistently <90 mL/min signify possible Chronic Kidney     Disease.  CBC     Status: None   Collection Time    05/14/13  5:15 AM      Result Value Ref Range   WBC 9.3  4.0 - 10.5 K/uL   RBC 4.14  3.87 - 5.11 MIL/uL   Hemoglobin 12.6  12.0 - 15.0 g/dL   HCT 37.3  36.0 - 46.0 %   MCV 90.1  78.0 - 100.0 fL   MCH 30.4  26.0 - 34.0 pg   MCHC 33.8  30.0 - 36.0 g/dL   RDW 13.2  11.5 - 15.5 %   Platelets 244  150 - 400 K/uL    Ct Abdomen Pelvis W Contrast  05/14/2013   CLINICAL DATA:  Lymphoma.  EXAM: CT ABDOMEN AND PELVIS WITH CONTRAST  TECHNIQUE: Multidetector CT imaging of the abdomen and pelvis was performed using the standard protocol following bolus administration of intravenous contrast.  CONTRAST:  38m OMNIPAQUE IOHEXOL 300 MG/ML SOLN, 81mOMNIPAQUE IOHEXOL 300 MG/ML SOLN  COMPARISON:  NM BONE WHOLE BODY dated 01/18/2013; CT ABD/PELVIS W CM dated 11/11/2012  FINDINGS: Punctate stable hepatic lucencies most consistent with simple cysts. No change from 11/11/2012. Spleen normal. Pancreas and biliary system  unremarkable.  Adrenals normal. Tiny stable punctate lucencies in the right kidney consistent with cysts. These are too small however for accurate evaluation . No hydronephrosis or hydroureter. The bladder is nondistended. Calcifications noted within the pelvis consistent with phleboliths. No pelvic masses. No free pelvic fluid collections are. Prior hysterectomy.  No inguinal or retroperitoneal adenopathy. The  abdominal aorta normal in caliber. Visceral vessels are patent. Portal vein is patent.  Appendectomy. Through the previously identified colostomy site there is prominent gastric herniation with associated gastric distention. Compression of adjacent colon appears to be present. No small bowel or colonic distention however is noted. Stool is noted throughout the colon. No free air noted. No pneumatosis or portal venous gas.  Heart size is normal. Passive atelectasis lung bases. Degenerative changes lumbar spine and both hips. Retrolisthesis of L2 on L3 again noted and unchanged.  IMPRESSION: 1. Gastric herniation through the left colostomy site with severe gastric distention most likely from obstruction. Compression of adjacent herniated colon is noted. No other bowel distention is noted. Surgical evaluation should be considered. These findings are new from prior exam. 2. No acute abnormality otherwise noted.   Electronically Signed   By: Marcello Moores  Register   On: 05/14/2013 00:54    Review of Systems  Constitutional: Negative.   HENT: Negative.   Eyes: Negative.   Respiratory: Negative.   Cardiovascular: Negative.   Gastrointestinal: Positive for nausea and abdominal pain.  Genitourinary: Negative.   Musculoskeletal: Negative.   Skin: Negative.   Neurological: Negative.   Endo/Heme/Allergies: Negative.   Psychiatric/Behavioral: Negative.    Blood pressure 125/57, pulse 70, temperature 98.1 F (36.7 C), temperature source Oral, resp. rate 16, height _0  (1.499 m), weight 94 lb 9.2 oz (42.9  kg), SpO2 99.00%. Physical Exam  Constitutional: She is oriented to person, place, and time. She appears well-developed and well-nourished. No distress.  HENT:  Head: Normocephalic and atraumatic.  Right Ear: External ear normal.  Left Ear: External ear normal.  Eyes: Conjunctivae are normal. Pupils are equal, round, and reactive to light. No scleral icterus.  Neck: Normal range of motion. Neck supple. No thyromegaly present.  Tracheostomy site scar  Cardiovascular: Normal rate, regular rhythm and normal heart sounds.   Respiratory: Effort normal and breath sounds normal. She has no wheezes.  GI: Soft. Bowel sounds are normal. She exhibits no distension and no mass. There is no tenderness. There is no rebound and no guarding.  Stoma in left lower quadrant; soft, parastomal hernia completely reduced.  No tenderness.  Musculoskeletal: Normal range of motion. She exhibits no edema.  Neurological: She is alert and oriented to person, place, and time.  Skin: Skin is warm and dry.  Psychiatric: She has a normal mood and affect. Her behavior is normal.    Assessment/Plan: 1. Parastomal hernia with upper GI obstruction  Much improved with NG decompression and reduction of hernia  Continue NG decompression and IV hydration  Hold Plavix in anticipation of operative/endoscopic intervention 2. Gastric outlet obstruction  Likely secondary to herniation, but should rule out other possible causes (PUD, neoplasm)  Will contact Dr. Ronald Lobo for consultation and possible EGD exam 3. Atrial fibrillation / CHF  Stable - appreciate medical service management 4. History of lymphoma  No sign of recurrence at this time  Discussed at bedside with patient, husband, and son.  Will ask GI (Dr. Cristina Gong) to evaluate.  Will consider bowel prep and reversal of colostomy with repair of hernia during this admission.  Earnstine Regal, MD, [facility] Office:  [facility] 05/14/2013, 10:02 AM

## 2013-05-14 NOTE — Progress Notes (Signed)
11:56 AM I agree with HPI/GPe and A/P per Dr. 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 Humphrey Rolls  07/22/11-see last office note 10/14 NP Hunter for details h/o emergent laparotomy/colectomy 11/21/12 for Perf diverticulitis-with Parastomal hernia noted LLQ, which was managed conservatively at [facility] request in past h/o Systolic chf Ef 14% 06/3152 in setting P SVT/Fib during Acute hospital stay 2013 ICU stay Multiple [5 tia's] on Plavix H/o Schatzki dilatation 2005 [Buccini]   Admitted early am 05/14/13 with Parastomal hernia and obstructive features  causing upper GI obstruction necessitating NG tube decompression Feels much better.  Tol meds Family @ bedside Sinus on monitors  D/w Dr. Penelope Coop who will see at some point ans is aware of patient   HEENT NG in situ CHEST clear no added sound CARDIAC s1 s2 no m/r/g ABDOMEN Bs + NEURO Intact SKIN/MUSCULAR ostomy clean   Patient Active Problem List   Diagnosis Date Noted  . Hernia with obstruction 05/14/2013  . Colostomy hernia 05/14/2013  . Parastomal colostomy hernia without obstruction or gangrene 06/28/2012  . Abdominal pain 04/14/2012  . Hypokalemia 04/14/2012  . Dysphagia 01/05/2012  . Pleural effusion on right 11/28/2011  . Acute systolic CHF (congestive heart failure) 11/25/2011  . Atrial fibrillation 11/23/2011  . Arthritis 09/04/2011  . Esophageal abnormality   . Follicular Low Grade B-Cell Lymphoma 07/22/2011

## 2013-05-14 NOTE — Consult Note (Signed)
Subjective:   HPI  The patient is a 80 year old female with a history of B-cell lymphoma who we are asked to see in regards to performing EGD and colonoscopy prior to intended surgery to reverse her colostomy by Dr. Harlow Asa next week. The patient has a colostomy do to and emergency surgery for a perforated diverticulum secondary to diverticulitis and peritonitis. She developed a parastomal hernia around the colostomy and states that this area has been bulging out for quite some time. She was admitted to the hospital this time with abdominal pain and vomiting. A CT scan revealed partial gastric herniation through the parastomal hernia region. She was treated with NG decompression and now her symptoms have resolved. Dr. Harlow Asa is planning to reverse her colostomy and repair the parastomal hernia area later next week but would like an EGD and colonoscopy through her stoma as well as up the Hartman's pouch to make sure nothing else is going on. The patient's last colonoscopy was in 2007 with findings only of left-sided diverticulosis.  Review of Systems Denies chest pain or shortness of breath  Past Medical History  Diagnosis Date  . Cancer 1/66/06    follicular B cell lymphoma  . TIA (transient ischemic attack)   . Arthritis   . Hypertension   . Esophageal abnormality     strictures  . Arthritis 09/04/2011  . Dehydration 11/20/2011  . Diarrhea 11/20/2011  . Complication of anesthesia   . PONV (postoperative nausea and vomiting)     in the 1980's  . GERD (gastroesophageal reflux disease)   . Esophageal stricture     x 2  . Septic shock(785.52) 11/22/2011  . Candida esophagitis 01/06/2012  . Diverticulitis of colon with perforation 11/21/2011  . Osteoporosis    Past Surgical History  Procedure Laterality Date  . Appendectomy    . Parotidectomy w/ neck dissection total    . Tonsillectomy    . Abdominal hysterectomy      left ovaries  . Laparotomy  11/22/2011    Procedure: EXPLORATORY  LAPAROTOMY;  Surgeon: Earnstine Regal, MD;  Location: [facility];  Service: General;  Laterality: N/A;  drainage of pelvic abscess  . Colostomy revision  11/22/2011    Procedure: COLON RESECTION SIGMOID;  Surgeon: Earnstine Regal, MD;  Location: [facility];  Service: General;  Laterality: N/A;  . Colostomy  11/22/2011    Procedure: COLOSTOMY;  Surgeon: Earnstine Regal, MD;  Location[facility];  Service: General;  Laterality: N/A;  desending colostomy  . Esophagogastroduodenoscopy  01/06/2012    Procedure: ESOPHAGOGASTRODUODENOSCOPY (EGD);  Surgeon: Milus Banister, MD;  Location: [facility];  Service: Endoscopy;  Laterality: N/A;   History   Social History  . Marital Status: Married    Spouse Name: N/A    Number of Children: N/A  . Years of Education: N/A   Occupational History  . Not on file.   Social History Main Topics  . Smoking status: Never Smoker   . Smokeless tobacco: Never Used  . Alcohol Use: No  . Drug Use: No  . Sexual Activity: Yes    Birth Control/ Protection: Post-menopausal, Surgical   Other Topics Concern  . Not on file   Social History Narrative  . No narrative on file   family history includes Cancer (age of onset: 74) in her mother; Cancer (age of onset: 50) in her maternal uncle; Cancer (age of onset: 5) in her maternal aunt; Hypertension in her father. Current facility-administered medications:0.9 %  sodium  chloride infusion, , Intravenous, Continuous, Toy Baker, MD, Last Rate: 75 mL/hr at 05/14/13 1300;  acetaminophen (TYLENOL) suppository 650 mg, 650 mg, Rectal, Q6H PRN, Toy Baker, MD;  acetaminophen (TYLENOL) tablet 650 mg, 650 mg, Oral, Q6H PRN, Toy Baker, MD [START ON 05/16/2013] fentaNYL (DURAGESIC - dosed mcg/hr) 12.5 mcg, 12.5 mcg, Transdermal, Q72H, Toy Baker, MD;  fentaNYL (SUBLIMAZE) injection 50 mcg, 50 mcg, Intravenous, Q1H PRN, Teressa Lower, MD, 50 mcg at 05/14/13 0125;  menthol-cetylpyridinium (CEPACOL) lozenge 3 mg, 1  lozenge, Oral, PRN, Earnstine Regal, MD;  metoprolol (LOPRESSOR) injection 2.5 mg, 2.5 mg, Intravenous, 4 times per day, Toy Baker, MD morphine 2 MG/ML injection 2 mg, 2 mg, Intravenous, Q4H PRN, Toy Baker, MD;  ondansetron (ZOFRAN) injection 4 mg, 4 mg, Intravenous, Q6H PRN, Toy Baker, MD;  ondansetron (ZOFRAN) tablet 4 mg, 4 mg, Oral, Q6H PRN, Toy Baker, MD;  pantoprazole (PROTONIX) injection 40 mg, 40 mg, Intravenous, QHS, Anastassia Doutova, MD phenol (CHLORASEPTIC) mouth spray 1 spray, 1 spray, Mouth/Throat, PRN, Earnstine Regal, MD, 1 spray at 05/14/13 1056;  sodium chloride 0.9 % injection 3 mL, 3 mL, Intravenous, Q12H, Toy Baker, MD Allergies  Allergen Reactions  . Codeine Hives  . Other Nausea And Vomiting    anesthesia  . Penicillins Hives  . Sulfa Antibiotics Hives     Objective:     BP 116/49  Pulse 70  Temp(Src) 98.6 F (37 C) (Oral)  Resp 16  Ht 4\' 11"  (1.499 m)  Wt 42.9 kg (94 lb 9.2 oz)  BMI 19.09 kg/m2  SpO2 97%  She is in no acute distress  Nonicteric  Heart regular rhythm no murmurs  Lungs clear  Abdomen: Bowel sounds are present, soft, nontender, colostomy present left side of abdomen. No surrounding bulging at this time from any obvious protrusion of the stomach.  Laboratory No components found with this basename: d1      Assessment:     #1. Parastomal hernia with herniation of the stomach causing obstructive symptoms. This has resolved with conservative therapy.  #2. Plan for reversal of colostomy later next week.  #3. Plan for EGD and colonoscopy next week prior to surgery      Plan:     The patient has been on Plavix but this was held in anticipation of surgery next week. We will plan to proceed with an EGD and colonoscopy through the colostomy site and Hartman's pouch at some point next week prior to anticipated surgery date which Dr. Harlow Asa told me would be Thursday.  Lab Results  Component Value  Date   HGB 12.6 05/14/2013   HGB 13.6 05/13/2013   HGB 13.1 05/13/2013   HGB 12.5 12/31/2012   HGB 11.7 06/28/2012   HGB 11.7 03/30/2012   HCT 37.3 05/14/2013   HCT 40.0 05/13/2013   HCT 38.4 05/13/2013   HCT 37.0 12/31/2012   HCT 35.6 06/28/2012   HCT 34.4* 03/30/2012   ALKPHOS 41 05/14/2013   ALKPHOS 43 05/13/2013   ALKPHOS 88 12/31/2012   ALKPHOS 67 06/28/2012   ALKPHOS 42 04/15/2012   ALKPHOS 73 03/30/2012   AST 21 05/14/2013   AST 22 05/13/2013   AST 18 12/31/2012   AST 18 06/28/2012   AST 18 04/15/2012   AST 28 03/30/2012   ALT 17 05/14/2013   ALT 19 05/13/2013   ALT 13 12/31/2012   ALT 11 06/28/2012   ALT 9 04/15/2012   ALT 12 03/30/2012   AMYLASE 39 01/01/2012  AMYLASE 28 12/24/2011

## 2013-05-14 NOTE — H&P (Signed)
PCP:  Marco Collie, MD  [name]   Chief Complaint:  abdominal pain  HPI: [name] is a 80 y.o. female   has a past medical history of Cancer (07/08/11); TIA (transient ischemic attack); Arthritis; Hypertension; Esophageal abnormality; Arthritis (09/04/2011); Dehydration (11/20/2011); Diarrhea (11/20/2011); Complication of anesthesia; PONV (postoperative nausea and vomiting); GERD (gastroesophageal reflux disease); Esophageal stricture; Septic shock(785.52) (11/22/2011); Candida esophagitis (01/06/2012); Diverticulitis of colon with perforation (11/21/2011); and Osteoporosis.   Presented with  pateint started to have severe abdominla pain and distension with vomiting earlier today. Patient also endorses decreased colostomy output which was unusual for her. She has a complex hx of bowel perforation requiring emergent surgery followed by prolonged intensive care unit stay requiring tracheostomy and then prolonged rehabilitation in August 2013. 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 Followed by Dr. Humphrey Rolls. Patient presented to emergency department and had a CT scan done that showed Gastric herniation through the left colostomy site with severe gastric distention most likely from obstruction. This was discussed by ER physician per general surgery who recommends at this point admission hospitalist service NG tube placement with surgery followup as a consult.     Review of Systems:    Pertinent positives include:abdominal pain, nausea, vomiting, diarrhea,  Constitutional:  No weight loss, night sweats, Fevers, chills, fatigue, weight loss  HEENT:  No headaches, Difficulty swallowing,Tooth/dental problems,Sore throat,  No sneezing, itching, ear ache, nasal congestion, post nasal drip,  Cardio-vascular:  No chest pain, Orthopnea, PND, anasarca, dizziness, palpitations.no Bilateral lower extremity swelling  GI:  No heartburn, indigestion,  change in bowel habits,  loss of appetite, melena, blood in stool, hematemesis Resp:  no shortness of breath at rest. No dyspnea on exertion, No excess mucus, no productive cough, No non-productive cough, No coughing up of blood.No change in color of mucus.No wheezing. Skin:  no rash or lesions. No jaundice GU:  no dysuria, change in color of urine, no urgency or frequency. No straining to urinate.  No flank pain.  Musculoskeletal:  No joint pain or no joint swelling. No decreased range of motion. No back pain.  Psych:  No change in mood or affect. No depression or anxiety. No memory loss.  Neuro: no localizing neurological complaints, no tingling, no weakness, no double vision, no gait abnormality, no slurred speech, no confusion  Otherwise ROS are negative except for above, 10 systems were reviewed  Past Medical History: Past Medical History  Diagnosis Date  . Cancer [id number]    follicular B cell lymphoma  . TIA (transient ischemic attack)   . Arthritis   . Hypertension   . Esophageal abnormality     strictures  . Arthritis 09/04/2011  . Dehydration 11/20/2011  . Diarrhea 11/20/2011  . Complication of anesthesia   . PONV (postoperative nausea and vomiting)     in the 1980's  . GERD (gastroesophageal reflux disease)   . Esophageal stricture     x 2  . Septic shock(785.52) 11/22/2011  . Candida esophagitis 01/06/2012  . Diverticulitis of colon with perforation 11/21/2011  . Osteoporosis    Past Surgical History  Procedure Laterality Date  . Appendectomy    . Parotidectomy w/ neck dissection total    . Tonsillectomy    . Abdominal hysterectomy      left ovaries  . Laparotomy  11/22/2011    Procedure: EXPLORATORY LAPAROTOMY;  Surgeon: Earnstine Regal, MD;  Location: [facility];  Service: General;  Laterality: N/A;  drainage  of pelvic abscess  . Colostomy revision  11/22/2011    Procedure: COLON RESECTION SIGMOID;  Surgeon: Earnstine Regal, MD;  Location: [facility];  Service: General;  Laterality: N/A;  .  Colostomy  11/22/2011    Procedure: COLOSTOMY;  Surgeon: Earnstine Regal, MD;  Location: [facility];  Service: General;  Laterality: N/A;  desending colostomy  . Esophagogastroduodenoscopy  01/06/2012    Procedure: ESOPHAGOGASTRODUODENOSCOPY (EGD);  Surgeon: Milus Banister, MD;  Location: [facility];  Service: Endoscopy;  Laterality: N/A;     Medications: Prior to Admission medications   Medication Sig Start Date End Date Taking? Authorizing Provider  amLODipine-atorvastatin (CADUET) 5-40 MG per tablet Take 1 tablet by mouth daily.  10/18/12  Yes Historical Provider, MD  clopidogrel (PLAVIX) 75 MG tablet Take 75 mg by mouth daily.   Yes Historical Provider, MD  fentaNYL (DURAGESIC - DOSED MCG/HR) 12 MCG/HR Place 1 patch (12.5 mcg total) onto the skin every 3 (three) days. 05/05/13  Yes Deatra Robinson, MD  megestrol (MEGACE) 40 MG/ML suspension Take 400 mg by mouth 2 (two) times daily.  04/11/12  Yes Historical Provider, MD  metoprolol (LOPRESSOR) 50 MG tablet Take 50 mg by mouth 2 (two) times daily.   Yes Historical Provider, MD  naproxen (NAPROSYN) 250 MG tablet Take 250 mg by mouth 2 (two) times daily as needed (pain).   Yes Historical Provider, MD  ondansetron (ZOFRAN) 4 MG tablet Take 1 tablet (4 mg total) by mouth every 6 (six) hours as needed for nausea. 04/19/12  Yes Theodis Blaze, MD  valsartan (DIOVAN) 160 MG tablet Take 160 mg by mouth daily.  12/11/12  Yes Historical Provider, MD    Allergies:   Allergies  Allergen Reactions  . Codeine Hives  . Other Nausea And Vomiting    anesthesia  . Penicillins Hives  . Sulfa Antibiotics Hives    Social History:  Lives at home   reports that she has never smoked. She has never used smokeless tobacco. She reports that she does not drink alcohol or use illicit drugs.   Family History: family history includes Cancer (age of onset: 73) in her mother; Cancer (age of onset: 5) in her maternal uncle; Cancer (age of onset: 66) in her maternal  aunt; Hypertension in her father.    Physical Exam: Patient Vitals for the past 24 hrs:  BP Temp Temp src Pulse Resp SpO2  05/13/13 2232 159/71 mmHg 97.5 F (36.4 C) Oral 74 16 100 %    1. General:  in No Acute distress 2. Psychological: Alert and  Oriented 3. Head/ENT:    Dry Mucous Membranes                          Head Non traumatic, neck supple                          Normal  Dentition, NG tube in place with large amount of brown feculent material in suction canister4. SKIN:  decreased Skin turgor,  Skin clean Dry and intact no rash 5. Heart: Regular rate and rhythm no Murmur, Rub or gallop 6. Lungs: Clear to auscultation bilaterally, no wheezes or crackles   7. Abdomen: Soft, non-tender, Non distended colostomy brown stool present 8. Lower extremities: no clubbing, cyanosis, or edema 9. Neurologically Grossly intact, moving all 4 extremities equally 10. MSK: Normal range of motion  body mass index is  unknown because there is no weight on file.   Labs on Admission:   Recent Labs  05/13/13 2026 05/13/13 2333  NA 141 145  K 3.1* 3.0*  CL 101 105  CO2 24  --   GLUCOSE 119* 117*  BUN 19 19  CREATININE 0.81 0.80  CALCIUM 9.2  --     Recent Labs  05/13/13 2026  AST 22  ALT 19  ALKPHOS 43  BILITOT 0.6  PROT 6.9  ALBUMIN 4.7    Recent Labs  05/13/13 2026  LIPASE 66*    Recent Labs  05/13/13 2026 05/13/13 2333  WBC 7.7  --   HGB 13.1 13.6  HCT 38.4 40.0  MCV 89.5  --   PLT 235  --    No results found for this basename: CKTOTAL, CKMB, CKMBINDEX, TROPONINI,  in the last 72 hours No results found for this basename: TSH, T4TOTAL, FREET3, T3FREE, THYROIDAB,  in the last 72 hours No results found for this basename: VITAMINB12, FOLATE, FERRITIN, TIBC, IRON, RETICCTPCT,  in the last 72 hours No results found for this basename: HGBA1C    The CrCl is unknown because both a height and weight (above a minimum accepted value) are required for this  calculation. ABG    Component Value Date/Time   PHART 7.508* 12/06/2011 0408   HCO3 26.7* 12/06/2011 0408   TCO2 24 05/13/2013 2333   ACIDBASEDEF 0.6 11/25/2011 0445   O2SAT 99.4 12/06/2011 0408      UA no evidence of infection   Cultures:    Component Value Date/Time   SDES URINE, RANDOM 01/14/2012 0752   SPECREQUEST NONE 01/14/2012 0752   CULT NO GROWTH 01/14/2012 0752   REPTSTATUS 01/15/2012 FINAL 01/14/2012 0752       Radiological Exams on Admission: Ct Abdomen Pelvis W Contrast  05/14/2013   CLINICAL DATA:  Lymphoma.  EXAM: CT ABDOMEN AND PELVIS WITH CONTRAST  TECHNIQUE: Multidetector CT imaging of the abdomen and pelvis was performed using the standard protocol following bolus administration of intravenous contrast.  CONTRAST:  86mL OMNIPAQUE IOHEXOL 300 MG/ML SOLN, 28mL OMNIPAQUE IOHEXOL 300 MG/ML SOLN  COMPARISON:  NM BONE WHOLE BODY dated 01/18/2013; CT ABD/PELVIS W CM dated 11/11/2012  FINDINGS: Punctate stable hepatic lucencies most consistent with simple cysts. No change from 11/11/2012. Spleen normal. Pancreas and biliary system unremarkable.  Adrenals normal. Tiny stable punctate lucencies in the right kidney consistent with cysts. These are too small however for accurate evaluation . No hydronephrosis or hydroureter. The bladder is nondistended. Calcifications noted within the pelvis consistent with phleboliths. No pelvic masses. No free pelvic fluid collections are. Prior hysterectomy.  No inguinal or retroperitoneal adenopathy. The abdominal aorta normal in caliber. Visceral vessels are patent. Portal vein is patent.  Appendectomy. Through the previously identified colostomy site there is prominent gastric herniation with associated gastric distention. Compression of adjacent colon appears to be present. No small bowel or colonic distention however is noted. Stool is noted throughout the colon. No free air noted. No pneumatosis or portal venous gas.  Heart size is normal. Passive  atelectasis lung bases. Degenerative changes lumbar spine and both hips. Retrolisthesis of L2 on L3 again noted and unchanged.  IMPRESSION: 1. Gastric herniation through the left colostomy site with severe gastric distention most likely from obstruction. Compression of adjacent herniated colon is noted. No other bowel distention is noted. Surgical evaluation should be considered. These findings are new from prior exam. 2. No acute abnormality otherwise noted.  Electronically Signed   By: Marcello Moores  Register   On: 05/14/2013 00:54    Chart has been reviewed  Assessment/Plan  This is a 80 year old female with history of surgical procedures required colostomy 2013 now with gastric herniation for colostomy site with evidence of obstruction surgeries away her symptoms improved after NG tube placement  Present on Admission:  . Hernia with obstruction - surgery is aware and will consult in a.m. continue conservative management  . Atrial fibrillation - continue metoprolol IV for rate control  . Hypokalemia - replace as needed    Prophylaxis: SCD , Protonix  CODE STATUS: FULL CODE  Other plan as per orders.  I have spent a total of 55 min on this admission  Staci Carver 05/14/2013, 3:01 AM

## 2013-05-15 MED ORDER — METOPROLOL TARTRATE 1 MG/ML IV SOLN
5.0000 mg | Freq: Four times a day (QID) | INTRAVENOUS | Status: DC
  Administered 2013-05-15 – 2013-05-23 (×29): 5 mg via INTRAVENOUS
  Filled 2013-05-15 (×35): qty 5

## 2013-05-15 MED ORDER — KCL IN DEXTROSE-NACL 30-5-0.45 MEQ/L-%-% IV SOLN
INTRAVENOUS | Status: DC
  Administered 2013-05-15 – 2013-05-20 (×6): via INTRAVENOUS
  Filled 2013-05-15 (×13): qty 1000

## 2013-05-15 NOTE — Progress Notes (Addendum)
Pt ambulated x 2 in hall with family members today. Tolerated ambulation. Vwilliams,rn.

## 2013-05-15 NOTE — Progress Notes (Signed)
Note: This document was prepared with digital dictation and possible smart phrase technology. Any transcriptional errors that result from this process are unintentional.   [name] YIR:[id number] DOB: November 18, 1931 DOA: 05/13/2013 PCP: Marco Collie, MD  Brief narrative: 80 y/o known history  stage III low grade B cell lymphoma, s/p Chemo-Foll Dr. [facility]rey Rolls 07/22/11-see last office note 10/14 NP [facility] for details  h/o emergent laparotomy/colectomy 11/21/12 for Perf diverticulitis-with Parastomal hernia noted LLQ, which was managed conservatively at [facility] request in past  h/o Systolic chf Ef 50% 0/9381 in setting P SVT/Fib during Acute hospital stay 2013 ICU stay  Multiple [5 tia's] on Plavix  H/o Schatzki dilatation 2005 [Buccini]  Admitted early am 05/14/13 with Parastomal hernia and obstructive features  causing upper GI obstruction necessitating NG tube decompression  Past medical history-As per Problem list Chart reviewed as below-   Consultants:  Gen surgery  Gastroenterology  Procedures:  None so far  Antibiotics:  none   Subjective  2/10 abdominal pain today. Tolerating ice chips Ambulated the whole unit Son wonders whether patient should have TPN/d nutrition    Objective    Interim History:   Telemetry: Sinus tachycardia 100   Objective: Filed Vitals:   05/15/13 0147 05/15/13 0657 05/15/13 1039 05/15/13 1238  BP: 159/65 149/61 127/20 137/45  Pulse: 76 73 71 92  Temp: 98.2 F (36.8 C) 98.5 F (36.9 C) 97.9 F (36.6 C) 97.8 F (36.6 C)  TempSrc: Oral Oral Oral Oral  Resp: 16 16 16 16   Height:      Weight:      SpO2: 99% 98% 100% 99%    Intake/Output Summary (Last 24 hours) at 05/15/13 1342 Last data filed at 05/15/13 1254  Gross per 24 hour  Intake   1985 ml  Output   1550 ml  Net    435 ml    Exam:  General: EOMI, NCAT, NG tube in situ Cardiovascular: S1-S2 slightly tachycardia occasional missed beat Respiratory: Clinically clear no  added sound Abdomen: Nondistended Skin no Largent edema Neuro intact  Data Reviewed: Basic Metabolic Panel:  Recent Labs Lab 05/13/13 2026 05/13/13 2333 05/14/13 0515  NA 141 145 139  K 3.1* 3.0* 3.6*  CL 101 105 102  CO2 24  --  24  GLUCOSE 119* 117* 132*  BUN 19 19 15   CREATININE 0.81 0.80 0.76  CALCIUM 9.2  --  8.8  MG  --   --  2.2  PHOS  --   --  2.6   Liver Function Tests:  Recent Labs Lab 05/13/13 2026 05/14/13 0515  AST 22 21  ALT 19 17  ALKPHOS 43 41  BILITOT 0.6 0.5  PROT 6.9 6.3  ALBUMIN 4.7 4.2    Recent Labs Lab 05/13/13 2026  LIPASE 66*   No results found for this basename: AMMONIA,  in the last 168 hours CBC:  Recent Labs Lab 05/13/13 2026 05/13/13 2333 05/14/13 0515  WBC 7.7  --  9.3  HGB 13.1 13.6 12.6  HCT 38.4 40.0 37.3  MCV 89.5  --  90.1  PLT 235  --  244   Cardiac Enzymes: No results found for this basename: CKTOTAL, CKMB, CKMBINDEX, TROPONINI,  in the last 168 hours BNP: No components found with this basename: POCBNP,  CBG: No results found for this basename: GLUCAP,  in the last 168 hours  No results found for this or any previous visit (from the past 240 hour(s)).   Studies:  All Imaging reviewed and is as per above notation   Scheduled Meds: . [START ON 05/16/2013] fentaNYL  12.5 mcg Transdermal Q72H  . metoprolol  2.5 mg Intravenous 4 times per day  . pantoprazole (PROTONIX) IV  40 mg Intravenous QHS  . sodium chloride  3 mL Intravenous Q12H   Continuous Infusions: . sodium chloride 75 mL/hr at 05/15/13 0616     Assessment/Plan: 1. Parasternal hernia with obstructive features-Gen. surgery to comment--from my standpoint if her sinus tachycardia resolves with IV metoprolol, she should be stable for any procedure-she carries moderate risk with regards to surgery 2. History stage III low-grade B cell lymphoma status post chemotherapy-GI will be seen patient for consideration of endoscopy/colonoscopy to  rule out any obscure evidences of malignancy prior surgery 3. Multiple TIAs in the past-currently off of Plavix in anticipation of multiple procedures-risk benefits alternatives known to family 4. Sinus tachycardia-probably secondary to beta blocker withdrawal.  I will continue to see patient in consult to help manage this and other medical issues 5. Prior history systolic CHF EF 70% [id number] at present 6. Mild hypokalemia-monitor in the morning 7. Impaired glucose tolerance-secondary to #1. Monitor 8. Hypertension-currently Caduet 5/40, Diovan 160 and metoprolol 50 twice a day are on hold. See above discussion 9. Lipidemia hold a steady at 10 mg for now  Code Status: Full Family Communication: Updated son at bedside Disposition Plan: Inpatient   Verneita Griffes, MD  [facility] Pager [phone] 05/15/2013, 1:42 PM    LOS: 2 days

## 2013-05-15 NOTE — Progress Notes (Signed)
Patient ID: [name], female   DOB: 1931/07/07, 80 y.o.   MRN: [id number]  [facility] - Progress Note  HD# 2  Subjective: Patient comfortable, husband at bedside.  No nausea or emesis.  Minimal pain.  Wants a "smoothie" to eat.  Objective: Vital signs in last 24 hours: Temp:  [97.9 F (36.6 C)-98.6 F (37 C)] 97.9 F (36.6 C) (02/22 1039) Pulse Rate:  [70-97] 71 (02/22 1039) Resp:  [15-16] 16 (02/22 1039) BP: (116-159)/(20-65) 127/20 mmHg (02/22 1039) SpO2:  [97 %-100 %] 100 % (02/22 1039) Last BM Date: 05/15/13  Intake/Output from previous day: 02/21 0701 - 02/22 0700 In: 2065 [P.O.:200; I.V.:1745; NG/GT:120] Out: 1550 [Urine:850; Emesis/NG output:700]  Exam: HEENT - clear, not icteric Neck - soft Chest - clear bilaterally Cor - rate controlled, no murmur Abd - soft without distension; BS present; stoma viable, no output in bag; no sign of parastomal hernia in recumbent position Ext - no significant edema Neuro - grossly intact, no focal deficits  Lab Results:   Recent Labs  05/13/13 2026 05/13/13 2333 05/14/13 0515  WBC 7.7  --  9.3  HGB 13.1 13.6 12.6  HCT 38.4 40.0 37.3  PLT 235  --  244     Recent Labs  05/13/13 2026 05/13/13 2333 05/14/13 0515  NA 141 145 139  K 3.1* 3.0* 3.6*  CL 101 105 102  CO2 24  --  24  GLUCOSE 119* 117* 132*  BUN 19 19 15   CREATININE 0.81 0.80 0.76  CALCIUM 9.2  --  8.8    Studies/Results: Ct Abdomen Pelvis W Contrast  05/14/2013   CLINICAL DATA:  Lymphoma.  EXAM: CT ABDOMEN AND PELVIS WITH CONTRAST  TECHNIQUE: Multidetector CT imaging of the abdomen and pelvis was performed using the standard protocol following bolus administration of intravenous contrast.  CONTRAST:  41mL OMNIPAQUE IOHEXOL 300 MG/ML SOLN, 47mL OMNIPAQUE IOHEXOL 300 MG/ML SOLN  COMPARISON:  NM BONE WHOLE BODY dated 01/18/2013; CT ABD/PELVIS W CM dated 11/11/2012  FINDINGS: Punctate stable hepatic lucencies most  consistent with simple cysts. No change from 11/11/2012. Spleen normal. Pancreas and biliary system unremarkable.  Adrenals normal. Tiny stable punctate lucencies in the right kidney consistent with cysts. These are too small however for accurate evaluation . No hydronephrosis or hydroureter. The bladder is nondistended. Calcifications noted within the pelvis consistent with phleboliths. No pelvic masses. No free pelvic fluid collections are. Prior hysterectomy.  No inguinal or retroperitoneal adenopathy. The abdominal aorta normal in caliber. Visceral vessels are patent. Portal vein is patent.  Appendectomy. Through the previously identified colostomy site there is prominent gastric herniation with associated gastric distention. Compression of adjacent colon appears to be present. No small bowel or colonic distention however is noted. Stool is noted throughout the colon. No free air noted. No pneumatosis or portal venous gas.  Heart size is normal. Passive atelectasis lung bases. Degenerative changes lumbar spine and both hips. Retrolisthesis of L2 on L3 again noted and unchanged.  IMPRESSION: 1. Gastric herniation through the left colostomy site with severe gastric distention most likely from obstruction. Compression of adjacent herniated colon is noted. No other bowel distention is noted. Surgical evaluation should be considered. These findings are new from prior exam. 2. No acute abnormality otherwise noted.   Electronically Signed   By: Marcello Moores  Register   On: 05/14/2013 00:54    Assessment / Plan: 1. Parastomal hernia with upper GI obstruction   Much improved with  NG decompression and reduction of hernia   Continue NG decompression until EGD performed   Hold Plavix in anticipation of operative/endoscopic intervention  2. Gastric outlet obstruction   Likely secondary to herniation, but should rule out other possible causes (PUD, neoplasm)   Dr. Ronald Lobo and partners to perform EGD / colonoscopy  this week - appreciate Dr. Estell Harpin evaluation and assistance  3. Atrial fibrillation / CHF   Stable - appreciate medical service management   Medical service to determine if any need for cardiology consultation pre-op - discussed with Dr. Verlon Au this AM 4. History of lymphoma   No sign of recurrence at this time  Tentatively planning for operative procedure either Wednesday or Friday depending on timing of GI procedures and availability of the operating room.  Earnstine Regal, MD, [facility] Office: [phone]  05/15/2013

## 2013-05-16 ENCOUNTER — Encounter (HOSPITAL_COMMUNITY)
Admission: EM | Disposition: A | Discharge status: Skilled Nursing Facility | Payer: Self-pay | Source: Home / Self Care | Attending: Surgery

## 2013-05-16 ENCOUNTER — Encounter (HOSPITAL_COMMUNITY): Payer: Self-pay | Admitting: *Deleted

## 2013-05-16 DIAGNOSIS — R112 Nausea with vomiting, unspecified: Secondary | ICD-10-CM | POA: Diagnosis present

## 2013-05-16 HISTORY — PX: ESOPHAGOGASTRODUODENOSCOPY: SHX5428

## 2013-05-16 SURGERY — EGD (ESOPHAGOGASTRODUODENOSCOPY)
Anesthesia: Moderate Sedation

## 2013-05-16 MED ORDER — DIPHENHYDRAMINE HCL 50 MG/ML IJ SOLN
INTRAMUSCULAR | Status: AC
  Filled 2013-05-16: qty 1

## 2013-05-16 MED ORDER — CIPROFLOXACIN IN D5W 400 MG/200ML IV SOLN: 400.0000 mg | INTRAVENOUS | Status: DC

## 2013-05-16 MED ORDER — FENTANYL CITRATE 0.05 MG/ML IJ SOLN
INTRAMUSCULAR | Status: DC | PRN
  Administered 2013-05-16: 25 ug via INTRAVENOUS

## 2013-05-16 MED ORDER — MIDAZOLAM HCL 10 MG/2ML IJ SOLN
INTRAMUSCULAR | Status: DC | PRN
  Administered 2013-05-16: 1 mg via INTRAVENOUS
  Administered 2013-05-16: 2 mg via INTRAVENOUS

## 2013-05-16 MED ORDER — SODIUM CHLORIDE 0.9 % IV SOLN
INTRAVENOUS | Status: DC
  Administered 2013-05-16: 10:00:00 via INTRAVENOUS

## 2013-05-16 MED ORDER — BUTAMBEN-TETRACAINE-BENZOCAINE 2-2-14 % EX AERO
INHALATION_SPRAY | CUTANEOUS | Status: DC | PRN
  Administered 2013-05-16: 2 via TOPICAL

## 2013-05-16 MED ORDER — FENTANYL CITRATE 0.05 MG/ML IJ SOLN
INTRAMUSCULAR | Status: AC
  Filled 2013-05-16: qty 2

## 2013-05-16 MED ORDER — MIDAZOLAM HCL 10 MG/2ML IJ SOLN
INTRAMUSCULAR | Status: AC
  Filled 2013-05-16: qty 2

## 2013-05-16 MED ORDER — METRONIDAZOLE IN NACL 5-0.79 MG/ML-% IV SOLN
500.0000 mg | INTRAVENOUS | Status: DC
Start: 2013-05-20 — End: 2013-05-18

## 2013-05-16 NOTE — H&P (View-Only) (Signed)
Patient ID: [name], female   DOB: 10-06-1931, 80 y.o.   MRN: [id number] [facility] Progress Note  [name] 80 y.o. [birth year]   Subjective: Feels better and states her "pumpkin" on her abdomen has gone down. NG draining dark bilious fluid. Husband at bedside.  Objective: Vital signs in last 24 hours: Filed Vitals:   05/16/13 0535  BP: 156/67  Pulse: 82  Temp: 98.4 F (36.9 C)  Resp: 17    Physical Exam: Gen: alert, no acute distress Abd: diffusely tender with guarding, ostomy intact and pink  Lab Results:  Recent Labs  05/13/13 2026 05/13/13 2333 05/14/13 0515  NA 141 145 139  K 3.1* 3.0* 3.6*  CL 101 105 102  CO2 24  --  24  GLUCOSE 119* 117* 132*  BUN 19 19 15   CREATININE 0.81 0.80 0.76  CALCIUM 9.2  --  8.8  MG  --   --  2.2  PHOS  --   --  2.6    Recent Labs  05/13/13 2026 05/14/13 0515  AST 22 21  ALT 19 17  ALKPHOS 43 41  BILITOT 0.6 0.5  PROT 6.9 6.3  ALBUMIN 4.7 4.2    Recent Labs  05/13/13 2026 05/13/13 2333 05/14/13 0515  WBC 7.7  --  9.3  HGB 13.1 13.6 12.6  HCT 38.4 40.0 37.3  MCV 89.5  --  90.1  PLT 235  --  244   No results found for this basename: LABPROT, INR,  in the last 72 hours    Assessment/Plan: 80 yo with possible gastric outlet obstruction in the setting of a parastomal hernia with gastric herniation through the left-sided colostomy. Hernia has been reduced and surgery is planned pending EGD/Colon. Will do EGD this morning to look for GOO source such as an ulcer or malignancy and if no GOO seen then will plan on oral lavage if possible for colonoscopy. If oral lavage not possible due to persistent NG drainage, then will need to do tap water enemas through ostomy. Patient aware and agreeable to plan.   [place] C. 05/16/2013, 9:39 AM

## 2013-05-16 NOTE — Progress Notes (Signed)
Patient stable. Medical issues stable. Dr. Harlow Asa assuming care. Updated son Dr. Lorayne Marek Son [phone]  Verneita Griffes, MD [facility] [phone]

## 2013-05-16 NOTE — Op Note (Signed)
[facility] [place], [address]   ENDOSCOPY PROCEDURE REPORT  PATIENT: Rebecca, Jefferson  MR#: [id number] BIRTHDATE: December 25, 1931 , 81  yrs. old GENDER: Female  ENDOSCOPIST: Wilford Corner, MD REFERRED IW:[id number] team  PROCEDURE DATE:  05/16/2013 PROCEDURE:   EGD, diagnostic ASA CLASS:   Class III INDICATIONS:abdominal pain.   Nausea.   Vomiting. MEDICATIONS: Fentanyl 25 mcg IV, Versed 3 mg IV, and Cetacaine spray x 2  TOPICAL ANESTHETIC:  DESCRIPTION OF PROCEDURE:   After the risks benefits and alternatives of the procedure were thoroughly explained, informed consent was obtained.  The Pentax Gastroscope [id number]  endoscope was introduced through the mouth and advanced to the second portion of the duodenum , limited by Without limitations.   The instrument was slowly withdrawn as the mucosa was fully examined.     FINDINGS: The endoscope was inserted into the oropharynx and esophagus was intubated.  The gastroesophageal junction was noted to be 40 cm from the incisors and a small superficial esophageal ulcer was noted at the GEJ. No esophageal stricture was seen. Endoscope was advanced into the stomach, where the tip of the NG tube was in the body of the stomach. Numerous superficial red spots seen in the greater curvature of the stomach likely due to NG trauma.  The endoscope was advanced to the duodenal bulb and second portion of duodenum, where two diverticulum were seen in the 2nd part of the duodenum. No ulcer or obstruction was noted in the examined duodenum or in the stomach. The endoscope was withdrawn back into the stomach and retroflexion revealed scattered red spots consistent with superficial gastritis likely due to NG trauma. No other abnormalities noted.  COMPLICATIONS: None  ENDOSCOPIC IMPRESSION:     1. Gastritis likely due to NG trauma 2. Duodenal diverticulum 3. Esophageal ulcer 4. NO gastric outlet obstruction seen;  Suspect gastric herniation caused a functional obstruction that has resolved  RECOMMENDATIONS: NG removed; Will give sips of water and ice chips and if tolerates give clears; Plan to do colonoscopy Wednesday through ostomy and pouch if tolerates prep in anticipation of planned surgery this week   REPEAT EXAM: N/A  _______________________________ Wilford Corner, MD eSigned:  Wilford Corner, MD 05/16/2013 11:07 AM    CC:  PATIENT NAME:  [name] MR#: [id number]

## 2013-05-16 NOTE — Progress Notes (Signed)
Patient ID: [name], female   DOB: 01/26/1932, 80 y.o.   MRN: [id number] [facility] Progress Note  [name] 80 y.o. 08/16/1931   Subjective: Feels better and states her "pumpkin" on her abdomen has gone down. NG draining dark bilious fluid. Husband at bedside.  Objective: Vital signs in last 24 hours: Filed Vitals:   05/16/13 0535  BP: 156/67  Pulse: 82  Temp: 98.4 F (36.9 C)  Resp: 17    Physical Exam: Gen: alert, no acute distress Abd: diffusely tender with guarding, ostomy intact and pink  Lab Results:  Recent Labs  05/13/13 2026 05/13/13 2333 05/14/13 0515  NA 141 145 139  K 3.1* 3.0* 3.6*  CL 101 105 102  CO2 24  --  24  GLUCOSE 119* 117* 132*  BUN 19 19 15  CREATININE 0.81 0.80 0.76  CALCIUM 9.2  --  8.8  MG  --   --  2.2  PHOS  --   --  2.6    Recent Labs  05/13/13 2026 05/14/13 0515  AST 22 21  ALT 19 17  ALKPHOS 43 41  BILITOT 0.6 0.5  PROT 6.9 6.3  ALBUMIN 4.7 4.2    Recent Labs  05/13/13 2026 05/13/13 2333 05/14/13 0515  WBC 7.7  --  9.3  HGB 13.1 13.6 12.6  HCT 38.4 40.0 37.3  MCV 89.5  --  90.1  PLT 235  --  244   No results found for this basename: LABPROT, INR,  in the last 72 hours    Assessment/Plan: 80 yo with possible gastric outlet obstruction in the setting of a parastomal hernia with gastric herniation through the left-sided colostomy. Hernia has been reduced and surgery is planned pending EGD/Colon. Will do EGD this morning to look for GOO source such as an ulcer or malignancy and if no GOO seen then will plan on oral lavage if possible for colonoscopy. If oral lavage not possible due to persistent NG drainage, then will need to do tap water enemas through ostomy. Patient aware and agreeable to plan.   Masyn Fullam C. 05/16/2013, 9:39 AM   

## 2013-05-16 NOTE — ED Provider Notes (Signed)
CSN: [id number]     Arrival date & time 05/13/13  2221 History   First MD Initiated Contact with Patient 05/13/13 2300     Chief Complaint  Patient presents with  . Abdominal Pain     (Consider location/radiation/quality/duration/timing/severity/associated sxs/prior Treatment) HPI History per patient. History of lymphoma in remission. History of perforated diverticulitis with surgery per Dr. Delana Meyer 2013, she has colostomy.  She presents tonight with nausea, vomiting multiple episodes with severe sharp abdominal pain and swelling in the area of her colostomy. She has a known peristomal hernia. Symptoms moderate to severe. No blood in emesis. Normal bowel movement earlier last night. No blood in stool.  Past Medical History  Diagnosis Date  . Cancer [id number]    follicular B cell lymphoma  . TIA (transient ischemic attack)   . Arthritis   . Hypertension   . Esophageal abnormality     strictures  . Arthritis 09/04/2011  . Dehydration 11/20/2011  . Diarrhea 11/20/2011  . Complication of anesthesia   . PONV (postoperative nausea and vomiting)     in the 1980's  . GERD (gastroesophageal reflux disease)   . Esophageal stricture     x 2  . Septic shock(785.52) 11/22/2011  . Candida esophagitis 01/06/2012  . Diverticulitis of colon with perforation 11/21/2011  . Osteoporosis    Past Surgical History  Procedure Laterality Date  . Appendectomy    . Parotidectomy w/ neck dissection total    . Tonsillectomy    . Abdominal hysterectomy      left ovaries  . Laparotomy  11/22/2011    Procedure: EXPLORATORY LAPAROTOMY;  Surgeon: Earnstine Regal, MD;  Location: [facility];  Service: General;  Laterality: N/A;  drainage of pelvic abscess  . Colostomy revision  11/22/2011    Procedure: COLON RESECTION SIGMOID;  Surgeon: Earnstine Regal, MD;  Location: [facility];  Service: General;  Laterality: N/A;  . Colostomy  11/22/2011    Procedure: COLOSTOMY;  Surgeon: Earnstine Regal, MD;  Location: [facility];  Service:  General;  Laterality: N/A;  desending colostomy  . Esophagogastroduodenoscopy  01/06/2012    Procedure: ESOPHAGOGASTRODUODENOSCOPY (EGD);  Surgeon: Milus Banister, MD;  Location: [facility];  Service: Endoscopy;  Laterality: N/A;   Family History  Problem Relation Age of Onset  . Cancer Mother 21    gastric  . Hypertension Father     heart disease  . Cancer Maternal Aunt 78    breast cancer  . Cancer Maternal Uncle 58    colon   History  Substance Use Topics  . Smoking status: Never Smoker   . Smokeless tobacco: Never Used  . Alcohol Use: No   OB History   Grav Para Term Preterm Abortions TAB SAB Ect Mult Living                 Review of Systems  Constitutional: Negative for fever and chills.  Respiratory: Negative for shortness of breath.   Cardiovascular: Negative for chest pain.  Gastrointestinal: Positive for nausea, vomiting and abdominal pain.  Genitourinary: Negative for dysuria and flank pain.  Musculoskeletal: Negative for back pain, neck pain and neck stiffness.  Skin: Negative for rash.  Neurological: Negative for headaches.  All other systems reviewed and are negative.      Allergies  Codeine; Other; Penicillins; and Sulfa antibiotics  Home Medications  No current outpatient prescriptions on file. BP 118/57  Pulse 67  Temp(Src) 98.2 F (36.8 C) (Oral)  Resp 16  Ht 4\' 11"  (1.499 m)  Wt 94 lb 9.2 oz (42.9 kg)  BMI 19.09 kg/m2  SpO2 98% Physical Exam  Constitutional: She is oriented to person, place, and time. She appears well-developed and well-nourished.  HENT:  Head: Normocephalic and atraumatic.  Mouth/Throat: Oropharynx is clear and moist.  Eyes: EOM are normal. Pupils are equal, round, and reactive to light. No scleral icterus.  Neck: Neck supple.  Cardiovascular: Regular rhythm and intact distal pulses.   Pulmonary/Chest: Effort normal and breath sounds normal. No respiratory distress.  Abdominal: Soft.  Tenderness with fullness in  the area of the colostomy/stoma, mild abdominal tenderness throughout otherwise. Decreased bowel sounds  Musculoskeletal: Normal range of motion. She exhibits no edema.  Neurological: She is alert and oriented to person, place, and time.  Skin: Skin is warm and dry.    ED Course  Procedures (including critical care time) Labs Review Results for orders placed during the hospital encounter of 05/13/13  CBC      Result Value Ref Range   WBC 7.7  4.0 - 10.5 K/uL   RBC 4.29  3.87 - 5.11 MIL/uL   Hemoglobin 13.1  12.0 - 15.0 g/dL   HCT 38.4  36.0 - 46.0 %   MCV 89.5  78.0 - 100.0 fL   MCH 30.5  26.0 - 34.0 pg   MCHC 34.1  30.0 - 36.0 g/dL   RDW 13.2  11.5 - 15.5 %   Platelets 235  150 - 400 K/uL  COMPREHENSIVE METABOLIC PANEL      Result Value Ref Range   Sodium 141  137 - 147 mEq/L   Potassium 3.1 (*) 3.7 - 5.3 mEq/L   Chloride 101  96 - 112 mEq/L   CO2 24  19 - 32 mEq/L   Glucose, Bld 119 (*) 70 - 99 mg/dL   BUN 19  6 - 23 mg/dL   Creatinine, Ser 0.81  0.50 - 1.10 mg/dL   Calcium 9.2  8.4 - 10.5 mg/dL   Total Protein 6.9  6.0 - 8.3 g/dL   Albumin 4.7  3.5 - 5.2 g/dL   AST 22  0 - 37 U/L   ALT 19  0 - 35 U/L   Alkaline Phosphatase 43  39 - 117 U/L   Total Bilirubin 0.6  0.3 - 1.2 mg/dL   GFR calc non Af Amer 67 (*) >90 mL/min   GFR calc Af Amer 78 (*) >90 mL/min  LIPASE, BLOOD      Result Value Ref Range   Lipase 66 (*) 11 - 59 U/L  URINALYSIS, ROUTINE W REFLEX MICROSCOPIC      Result Value Ref Range   Color, Urine YELLOW  YELLOW   APPearance CLEAR  CLEAR   Specific Gravity, Urine 1.011  1.005 - 1.030   pH 8.0  5.0 - 8.0   Glucose, UA NEGATIVE  NEGATIVE mg/dL   Hgb urine dipstick TRACE (*) NEGATIVE   Bilirubin Urine NEGATIVE  NEGATIVE   Ketones, ur NEGATIVE  NEGATIVE mg/dL   Protein, ur NEGATIVE  NEGATIVE mg/dL   Urobilinogen, UA 0.2  0.0 - 1.0 mg/dL   Nitrite NEGATIVE  NEGATIVE   Leukocytes, UA NEGATIVE  NEGATIVE  URINE MICROSCOPIC-ADD ON      Result Value Ref  Range   Squamous Epithelial / LPF RARE  RARE   RBC / HPF 3-6  <3 RBC/hpf  MAGNESIUM      Result Value Ref Range   Magnesium 2.2  1.5 -  2.5 mg/dL  PHOSPHORUS      Result Value Ref Range   Phosphorus 2.6  2.3 - 4.6 mg/dL  COMPREHENSIVE METABOLIC PANEL      Result Value Ref Range   Sodium 139  137 - 147 mEq/L   Potassium 3.6 (*) 3.7 - 5.3 mEq/L   Chloride 102  96 - 112 mEq/L   CO2 24  19 - 32 mEq/L   Glucose, Bld 132 (*) 70 - 99 mg/dL   BUN 15  6 - 23 mg/dL   Creatinine, Ser 0.76  0.50 - 1.10 mg/dL   Calcium 8.8  8.4 - 10.5 mg/dL   Total Protein 6.3  6.0 - 8.3 g/dL   Albumin 4.2  3.5 - 5.2 g/dL   AST 21  0 - 37 U/L   ALT 17  0 - 35 U/L   Alkaline Phosphatase 41  39 - 117 U/L   Total Bilirubin 0.5  0.3 - 1.2 mg/dL   GFR calc non Af Amer 78 (*) >90 mL/min   GFR calc Af Amer >90  >90 mL/min  CBC      Result Value Ref Range   WBC 9.3  4.0 - 10.5 K/uL   RBC 4.14  3.87 - 5.11 MIL/uL   Hemoglobin 12.6  12.0 - 15.0 g/dL   HCT 37.3  36.0 - 46.0 %   MCV 90.1  78.0 - 100.0 fL   MCH 30.4  26.0 - 34.0 pg   MCHC 33.8  30.0 - 36.0 g/dL   RDW 13.2  11.5 - 15.5 %   Platelets 244  150 - 400 K/uL  I-STAT CHEM 8, ED      Result Value Ref Range   Sodium 145  137 - 147 mEq/L   Potassium 3.0 (*) 3.7 - 5.3 mEq/L   Chloride 105  96 - 112 mEq/L   BUN 19  6 - 23 mg/dL   Creatinine, Ser 0.80  0.50 - 1.10 mg/dL   Glucose, Bld 117 (*) 70 - 99 mg/dL   Calcium, Ion 1.10 (*) 1.13 - 1.30 mmol/L   TCO2 24  0 - 100 mmol/L   Hemoglobin 13.6  12.0 - 15.0 g/dL   HCT 40.0  36.0 - 46.0 %   Ct Abdomen Pelvis W Contrast  05/14/2013   CLINICAL DATA:  Lymphoma.  EXAM: CT ABDOMEN AND PELVIS WITH CONTRAST  TECHNIQUE: Multidetector CT imaging of the abdomen and pelvis was performed using the standard protocol following bolus administration of intravenous contrast.  CONTRAST:  11mL OMNIPAQUE IOHEXOL 300 MG/ML SOLN, 81mL OMNIPAQUE IOHEXOL 300 MG/ML SOLN  COMPARISON:  NM BONE WHOLE BODY dated 01/18/2013; CT ABD/PELVIS  W CM dated 11/11/2012  FINDINGS: Punctate stable hepatic lucencies most consistent with simple cysts. No change from 11/11/2012. Spleen normal. Pancreas and biliary system unremarkable.  Adrenals normal. Tiny stable punctate lucencies in the right kidney consistent with cysts. These are too small however for accurate evaluation . No hydronephrosis or hydroureter. The bladder is nondistended. Calcifications noted within the pelvis consistent with phleboliths. No pelvic masses. No free pelvic fluid collections are. Prior hysterectomy.  No inguinal or retroperitoneal adenopathy. The abdominal aorta normal in caliber. Visceral vessels are patent. Portal vein is patent.  Appendectomy. Through the previously identified colostomy site there is prominent gastric herniation with associated gastric distention. Compression of adjacent colon appears to be present. No small bowel or colonic distention however is noted. Stool is noted throughout the colon. No free air noted. No pneumatosis  or portal venous gas.  Heart size is normal. Passive atelectasis lung bases. Degenerative changes lumbar spine and both hips. Retrolisthesis of L2 on L3 again noted and unchanged.  IMPRESSION: 1. Gastric herniation through the left colostomy site with severe gastric distention most likely from obstruction. Compression of adjacent herniated colon is noted. No other bowel distention is noted. Surgical evaluation should be considered. These findings are new from prior exam. 2. No acute abnormality otherwise noted.   Electronically Signed   By: Garfield   On: 05/14/2013 00:54   IV fluids. IV Zofran. IV fentanyl. IV Dilaudid.  On recheck symptoms improved. CT scan reviewed as above with patient in general surgery consult. Dr. Arnoldo Morale on call reviewed CT scan, recommends medical admission and NG tube now  Dr Roel Cluck to admit  MDM   Final diagnoses:  Hernia with obstruction  Atrial fibrillation  Hypokalemia    IV fluids. IV  narcotics. Labs and CT scan abdomen pelvis General surgery consult Medical admission    Teressa Lower, MD 05/16/13 [id number]

## 2013-05-16 NOTE — Interval H&P Note (Signed)
History and Physical Interval Note:  05/16/2013 10:39 AM  [name]  has presented today for surgery, with the diagnosis of possible gastric outlet obstruction  The various methods of treatment have been discussed with the patient and family. After consideration of risks, benefits and other options for treatment, the patient has consented to  Procedure(s): ESOPHAGOGASTRODUODENOSCOPY (EGD) (N/A) as a surgical intervention .  The patient's history has been reviewed, patient examined, no change in status, stable for surgery.  I have reviewed the patient's chart and labs.  Questions were answered to the patient's satisfaction.     [name]

## 2013-05-16 NOTE — Progress Notes (Signed)
Patient ID: [name], female   DOB: 1931/12/20, 80 y.o.   MRN: [id number]  General Surgery - [facility] - Progress Note  Subjective: Patient just back from EGD this AM.  Results noted and reviewed with patient and husband at bedside.  Objective: Vital signs in last 24 hours: Temp:  [97.4 F (36.3 C)-98.4 F (36.9 C)] 98.3 F (36.8 C) (02/23 1125) Pulse Rate:  [67-96] 73 (02/23 1145) Resp:  [11-72] 14 (02/23 1145) BP: (112-168)/(45-87) 126/56 mmHg (02/23 1145) SpO2:  [97 %-100 %] 99 % (02/23 1145) Last BM Date: 05/15/13  Intake/Output from previous day: 02/22 0701 - 02/23 0700 In: 1505 [I.V.:1385; NG/GT:120] Out: 1475 [Urine:775; Emesis/NG output:700]  Exam: HEENT - clear, not icteric Neck - soft Chest - clear bilaterally Cor - RRR, no murmur Abd - soft without distension; stoma viable Ext - no significant edema Neuro - grossly intact, no focal deficits  Lab Results:   Recent Labs  05/13/13 2026 05/13/13 2333 05/14/13 0515  WBC 7.7  --  9.3  HGB 13.1 13.6 12.6  HCT 38.4 40.0 37.3  PLT 235  --  244     Recent Labs  05/13/13 2026 05/13/13 2333 05/14/13 0515  NA 141 145 139  K 3.1* 3.0* 3.6*  CL 101 105 102  CO2 24  --  24  GLUCOSE 119* 117* 132*  BUN 19 19 15   CREATININE 0.81 0.80 0.76  CALCIUM 9.2  --  8.8    Studies/Results: No results found.  Assessment / Plan: 1.  Parastomal hernia with obstruction - reduced  EGD without significant gastric pathology  Plan to proceed with colon prep and colonoscopy on Wednesday 2/25 per GI  Tentatively scheduled OR for Friday 2/27 for reversal of colostomy and repair of parastomal hernia  Discussed this AM with Dr. Cristina Gong  Appreciate medical service and GI service assistance  Will be happy to take onto my surgical service at this time  Earnstine Regal, MD, [facility] Office: [phone]  05/16/2013

## 2013-05-17 ENCOUNTER — Encounter (HOSPITAL_COMMUNITY): Payer: Self-pay | Admitting: Gastroenterology

## 2013-05-17 MED ORDER — SODIUM CHLORIDE 0.9 % IV SOLN
INTRAVENOUS | Status: DC
  Administered 2013-05-17: 11:00:00 via INTRAVENOUS
  Administered 2013-05-18: 500 mL via INTRAVENOUS
  Administered 2013-05-19: 10:00:00 via INTRAVENOUS

## 2013-05-17 MED ORDER — POLYETHYLENE GLYCOL 3350 17 GM/SCOOP PO POWD
1.0000 | Freq: Once | ORAL | Status: AC
  Administered 2013-05-17: 1 via ORAL
  Filled 2013-05-17: qty 255

## 2013-05-17 MED ORDER — SODIUM CHLORIDE 0.9 % IJ SOLN
10.0000 mL | INTRAMUSCULAR | Status: DC | PRN
  Administered 2013-05-21: 30 mL
  Administered 2013-05-21 – 2013-05-25 (×3): 10 mL

## 2013-05-17 NOTE — Progress Notes (Addendum)
Patient ID: [name], female   DOB: [birth year], 80 y.o.   MRN: [id number] [facility] Progress Note  Alissa Pharr 80 y.o. [birth year]   Subjective: Feels ok. Denies abdominal pain; Tolerating ice chips. No vomiting.  Objective: Vital signs in last 24 hours: Filed Vitals:   05/17/13 0630  BP: 134/52  Pulse: 63  Temp: 98.5 F (36.9 C)  Resp: 18    Physical Exam: Gen: alert, no acute distress Abd: diffusely tender with voluntary guarding; ostomy intact with brown stool noted  Lab Results:   Assessment/Plan: 80 yo with recent gastric herniation in parastomal hernia at left colostomy: Colonoscopy planned for tomorrow; will start prep this afternoon; start clears now.   Surrency C. 05/17/2013, 10:20 AM

## 2013-05-17 NOTE — Progress Notes (Signed)
Patient ID: [name], female   DOB: 1931/05/04, 80 y.o.   MRN: [id number]  General Surgery - [facility] - Progress Note  Subjective: Patient up at bedside.  No pain.  Ambulatory.  Beginning bowel prep for colonoscopy tomorrow.  Planning colostomy closure and parastomal hernia repair on Friday, 2/27.  Objective: Vital signs in last 24 hours: Temp:  [98.1 F (36.7 C)-99.4 F (37.4 C)] 98.5 F (36.9 C) (02/24 0630) Pulse Rate:  [58-82] 63 (02/24 0630) Resp:  [12-20] 18 (02/24 0630) BP: (112-134)/(50-70) 134/52 mmHg (02/24 0630) SpO2:  [94 %-99 %] 99 % (02/24 0630) Last BM Date: 05/16/13  Intake/Output from previous day: 02/23 0701 - 02/24 0700 In: 2650 [I.V.:2650] Out: 2375 [Urine:1575; Emesis/NG output:800]  Exam: HEENT - clear, not icteric Neck - soft Chest - clear bilaterally Cor - rate controlled Abd - soft; parastomal hernia protrudes when standing, soft, non-tender Ext - no significant edema Neuro - grossly intact, no focal deficits  Lab Results:  No results found for this basename: WBC, HGB, HCT, PLT,  in the last 72 hours  No results found for this basename: NA, K, CL, CO2, GLUCOSE, BUN, CREATININE, CALCIUM,  in the last 72 hours  Studies/Results: No results found.  Assessment / Plan: 1.  Parastomal hernia with obstruction  Beginning colon prep today  Anticipate colonoscopy tomorrow  Surgery scheduled for Friday 2/27 for colostomy closure and repair of parastomal hernia  Allow clear liquid diet  Earnstine Regal, MD, [facility] Office: [phone]  05/17/2013

## 2013-05-18 ENCOUNTER — Encounter (HOSPITAL_COMMUNITY)
Admission: EM | Disposition: A | Discharge status: Skilled Nursing Facility | Payer: Self-pay | Source: Home / Self Care | Attending: Surgery

## 2013-05-18 ENCOUNTER — Encounter (HOSPITAL_COMMUNITY): Payer: Self-pay | Admitting: *Deleted

## 2013-05-18 HISTORY — PX: COLONOSCOPY: SHX5424

## 2013-05-18 SURGERY — COLONOSCOPY
Anesthesia: Moderate Sedation

## 2013-05-18 MED ORDER — DIPHENHYDRAMINE HCL 50 MG/ML IJ SOLN
INTRAMUSCULAR | Status: AC
  Filled 2013-05-18: qty 1

## 2013-05-18 MED ORDER — FENTANYL CITRATE 0.05 MG/ML IJ SOLN
INTRAMUSCULAR | Status: DC | PRN
  Administered 2013-05-18: 12.5 ug via INTRAVENOUS
  Administered 2013-05-18: 25 ug via INTRAVENOUS
  Administered 2013-05-18: 12.5 ug via INTRAVENOUS

## 2013-05-18 MED ORDER — MIDAZOLAM HCL 10 MG/2ML IJ SOLN
INTRAMUSCULAR | Status: DC | PRN
  Administered 2013-05-18: 1 mg via INTRAVENOUS
  Administered 2013-05-18: 2 mg via INTRAVENOUS
  Administered 2013-05-18: 1 mg via INTRAVENOUS

## 2013-05-18 MED ORDER — FENTANYL CITRATE 0.05 MG/ML IJ SOLN
INTRAMUSCULAR | Status: AC
  Filled 2013-05-18: qty 2

## 2013-05-18 MED ORDER — MIDAZOLAM HCL 10 MG/2ML IJ SOLN
INTRAMUSCULAR | Status: AC
  Filled 2013-05-18: qty 2

## 2013-05-18 NOTE — Op Note (Signed)
[facility] [place], [address]   COLONOSCOPY PROCEDURE REPORT  PATIENT: [name], [name]  MR#: [id number] BIRTHDATE: [birth year] , 81  yrs. old GENDER: Female ENDOSCOPIST: Wilford Corner, MD REFERRED BY: PROCEDURE DATE:  05/18/2013 PROCEDURE:   Colonoscopy, diagnostic ASA CLASS:   Class III INDICATIONS:Abdominal pain. MEDICATIONS: Fentanyl 50 mcg IV and Versed 4 mg IV  DESCRIPTION OF PROCEDURE:   After the risks benefits and alternatives of the procedure were thoroughly explained, informed consent was obtained.  The Pentax Ped Colon [id number]  endoscope was introduced through the descending colostomy and advanced to the cecum, which was identified by both the appendix and ileocecal valve , limited by No adverse events experienced.   limited by Limited by a tortuous and redundant colon.   limited by Limited by extreme patient discomfort.   The quality of the prep was fair. . The instrument was then slowly withdrawn as the colon was fully examined.     FINDINGS:  Pediatric colonoscope unsuccessfully used to intubate colostomy and then ultraslim colonoscope used to intubate descending colostomy. Colonoscope passed with difficulty due to looping and redundant colon to the cecum where the appendiceal orifice and ileocecal valve were identified.    On careful withdrawal of the colonoscope diffuse diverticuli noted in the descending colon. No other mucosal abnormalities seen during colonoscopy through the ostomy. The colonoscope was removed from the ostomy and then inserted into an unprepped rectal pouch where solid stool was noted. The colonoscope was advanced to 15 cm but solid stool and patient discomfort prevented complete visualization of the proximal end of the pouch. Erythematous streaks from air insufflation was noted to appear during evaluation of the pouch (mild barotrauma) and the procedure was terminated.  Retroflexion was not  attempted.  COMPLICATIONS: None  IMPRESSION:     Diverticulosis in descending colon otherwise normal colonoscopy through ostomy Limited view of rectal pouch but no mass lesions seen in examined mucosa  RECOMMENDATIONS: Ok to proceed with takedown as planned by surgery; Clears    ______________________________ eSigned:  Wilford Corner, MD 05/18/2013 10:32 AM   VQ:QVZD [name], MD  PATIENT NAME:  [name] MR#: [id number]

## 2013-05-18 NOTE — H&P (View-Only) (Signed)
Patient ID: [name], female   DOB: 02/27/1932, 81 y.o.   MRN: [id number] [facility] Progress Note  [name] 81 y.o. 03/22/1932   Subjective: Feels ok. Denies abdominal pain; Tolerating ice chips. No vomiting.  Objective: Vital signs in last 24 hours: Filed Vitals:   05/17/13 0630  BP: 134/52  Pulse: 63  Temp: 98.5 F (36.9 C)  Resp: 18    Physical Exam: Gen: alert, no acute distress Abd: diffusely tender with voluntary guarding; ostomy intact with brown stool noted  Lab Results:   Assessment/Plan: 81 yo with recent gastric herniation in parastomal hernia at left colostomy: Colonoscopy planned for tomorrow; will start prep this afternoon; start clears now.   Dartagnan Beavers C. 05/17/2013, 10:20 AM   

## 2013-05-18 NOTE — Progress Notes (Signed)
Patient ID: [name], female   DOB: 04-29-1931, 80 y.o.   MRN: [id number]  General Surgery - [facility] - Progress Note  Subjective: Patient comfortable.  No problems with colonic prep for colonoscopy today.  No obstructive symptoms.  Husband at bedside.  Objective: Vital signs in last 24 hours: Temp:  [97.3 F (36.3 C)-98.4 F (36.9 C)] 98.1 F (36.7 C) (02/25 0641) Pulse Rate:  [63-75] 75 (02/25 0641) Resp:  [16-18] 16 (02/25 0641) BP: (129-142)/(44-61) 139/61 mmHg (02/25 0641) SpO2:  [97 %-100 %] 97 % (02/25 0641) Last BM Date: 05/16/13  Intake/Output from previous day: 02/24 0701 - 02/25 0700 In: 720 [P.O.:720] Out: 2375 [Urine:1075; Stool:1300]  Exam: HEENT - clear, not icteric Neck - soft Chest - clear bilaterally Cor - rate controlled Abd - soft, scaphoid, non-tender Ext - no significant edema Neuro - grossly intact, no focal deficits  Lab Results:  No results found for this basename: WBC, HGB, HCT, PLT,  in the last 72 hours  No results found for this basename: NA, K, CL, CO2, GLUCOSE, BUN, CREATININE, CALCIUM,  in the last 72 hours  Studies/Results: No results found.  Assessment / Plan: 1.  Parastomal hernia with obstruction  Plan colonoscopy and proctoscopy today in preparation for surgery  Clear liquid diet after procedure  OOB, ambulate in halls please  Earnstine Regal, MD, [facility] Office: [phone]  05/18/2013

## 2013-05-18 NOTE — Interval H&P Note (Signed)
History and Physical Interval Note:  05/18/2013 9:35 AM  [name]  has presented today for surgery, with the diagnosis of abdominal pain  The various methods of treatment have been discussed with the patient and family. After consideration of risks, benefits and other options for treatment, the patient has consented to  Procedure(s): COLONOSCOPY (N/A) as a surgical intervention .  The patient's history has been reviewed, patient examined, no change in status, stable for surgery.  I have reviewed the patient's chart and labs.  Questions were answered to the patient's satisfaction.     Chamberlain C.

## 2013-05-18 NOTE — Brief Op Note (Signed)
See endopro note for details. Diverticulosis noted on colonoscopy through ostomy otherwise negative. Proctoscopy unrevealing. Clear liquid diet started. Ok to proceed with takedown of ostomy per Dr. Gala Lewandowsky plans.

## 2013-05-19 MED ORDER — NEOMYCIN SULFATE 500 MG PO TABS
1000.0000 mg | ORAL_TABLET | Freq: Once | ORAL | Status: AC
  Administered 2013-05-19: 1000 mg via ORAL
  Filled 2013-05-19: qty 2

## 2013-05-19 MED ORDER — FLEET ENEMA 7-19 GM/118ML RE ENEM
1.0000 | ENEMA | Freq: Once | RECTAL | Status: AC
  Administered 2013-05-19: 1 via RECTAL
  Filled 2013-05-19: qty 1

## 2013-05-19 MED ORDER — ERYTHROMYCIN BASE 250 MG PO TABS
500.0000 mg | ORAL_TABLET | Freq: Once | ORAL | Status: AC
  Administered 2013-05-19: 500 mg via ORAL
  Filled 2013-05-19: qty 2

## 2013-05-19 MED ORDER — FLEET ENEMA 7-19 GM/118ML RE ENEM
1.0000 | ENEMA | Freq: Once | RECTAL | Status: AC
  Administered 2013-05-19: 23:00:00 via RECTAL
  Filled 2013-05-19: qty 1

## 2013-05-19 NOTE — Progress Notes (Signed)
Patient ID: [name], female   DOB: [birth year], 80 y.o.   MRN: [id number]  Surgery planned for tomorrow. No further GI recs. Please have pt f/u with Dr. Cristina Gong in 4-6 weeks in office and he can also decide whether to pursue any further screening colonoscopies. Will sign off. Call if questions.

## 2013-05-19 NOTE — Progress Notes (Signed)
Patient ID: [name], female   DOB: 07/28/1931, 80 y.o.   MRN: [id number]  General Surgery - [facility] - Progress Note  Subjective: Patient up in chair.  No complaints.  Husband at bedside.  Objective: Vital signs in last 24 hours: Temp:  [97.5 F (36.4 C)-98 F (36.7 C)] 97.9 F (36.6 C) (02/26 0607) Pulse Rate:  [63-70] 63 (02/26 0607) Resp:  [16-18] 16 (02/26 0607) BP: (118-154)/(49-65) 153/65 mmHg (02/26 0607) SpO2:  [98 %-100 %] 98 % (02/26 0607) Last BM Date: 05/16/13  Intake/Output from previous day: 02/25 0701 - 02/26 0700 In: 400 [P.O.:120; I.V.:280] Out: 400 [Urine:400]  Exam: HEENT - clear, not icteric Neck - soft Chest - clear bilaterally Cor - rate controlled Abd - soft, hernia reduced; no output from stoma; no tenderness Ext - no significant edema Neuro - grossly intact, no focal deficits  Lab Results:  No results found for this basename: WBC, HGB, HCT, PLT,  in the last 72 hours  No results found for this basename: NA, K, CL, CO2, GLUCOSE, BUN, CREATININE, CALCIUM,  in the last 72 hours  Studies/Results: No results found.  Assessment / Plan: 1.  Parastomal hernia with obstruction  Plan colostomy closure and repair of parastomal hernia tomorrow AM  Orders for bowel prep and permit written  Discussed with patient and husband at bedside 2.  Atrial fibrillation  Plavix on hold for 5 days  Rate controlled with IV lopressor 3.  B-cell lymphoma  In remission  The risks and benefits of the procedure have been discussed at length with the patient.  The patient understands the proposed procedure, potential alternative treatments, and the course of recovery to be expected.  All of the patient's questions have been answered at this time.  The patient wishes to proceed with surgery.  Earnstine Regal, MD, [facility] Office: [phone]  05/19/2013

## 2013-05-20 ENCOUNTER — Inpatient Hospital Stay (HOSPITAL_COMMUNITY): Payer: Medicare Other | Admitting: Anesthesiology

## 2013-05-20 ENCOUNTER — Encounter (HOSPITAL_COMMUNITY): Payer: Medicare Other | Admitting: Anesthesiology

## 2013-05-20 ENCOUNTER — Inpatient Hospital Stay (HOSPITAL_COMMUNITY): Payer: Medicare Other

## 2013-05-20 ENCOUNTER — Encounter (HOSPITAL_COMMUNITY): Payer: Self-pay | Admitting: Gastroenterology

## 2013-05-20 ENCOUNTER — Encounter (HOSPITAL_COMMUNITY)
Admission: EM | Disposition: A | Discharge status: Skilled Nursing Facility | Payer: Self-pay | Source: Home / Self Care | Attending: Surgery

## 2013-05-20 DIAGNOSIS — Z433 Encounter for attention to colostomy: Secondary | ICD-10-CM

## 2013-05-20 DIAGNOSIS — K433 Parastomal hernia with obstruction, without gangrene: Secondary | ICD-10-CM | POA: Diagnosis present

## 2013-05-20 HISTORY — PX: PARASTOMAL HERNIA REPAIR: SHX2162

## 2013-05-20 HISTORY — PX: COLOSTOMY CLOSURE: SHX1381

## 2013-05-20 LAB — SURGICAL PCR SCREEN
MRSA, PCR: NEGATIVE
Staphylococcus aureus: NEGATIVE

## 2013-05-20 LAB — CBC
HEMATOCRIT: 37.5 % (ref 36.0–46.0)
HEMOGLOBIN: 12.9 g/dL (ref 12.0–15.0)
MCH: 30.7 pg (ref 26.0–34.0)
MCHC: 34.4 g/dL (ref 30.0–36.0)
MCV: 89.3 fL (ref 78.0–100.0)
Platelets: 252 10*3/uL (ref 150–400)
RBC: 4.2 MIL/uL (ref 3.87–5.11)
RDW: 12.8 % (ref 11.5–15.5)
WBC: 12.9 10*3/uL — ABNORMAL HIGH (ref 4.0–10.5)

## 2013-05-20 LAB — CREATININE, SERUM
Creatinine, Ser: 0.6 mg/dL (ref 0.50–1.10)
GFR calc non Af Amer: 84 mL/min — ABNORMAL LOW (ref >90)

## 2013-05-20 SURGERY — REPAIR, HERNIA, PARASTOMAL
Anesthesia: General | Site: Abdomen

## 2013-05-20 MED ORDER — LACTATED RINGERS IV SOLN: INTRAVENOUS | Status: DC

## 2013-05-20 MED ORDER — IRBESARTAN 150 MG PO TABS
150.0000 mg | ORAL_TABLET | Freq: Every day | ORAL | Status: DC
  Administered 2013-05-20: 150 mg via ORAL
  Filled 2013-05-20: qty 1

## 2013-05-20 MED ORDER — CIPROFLOXACIN IN D5W 400 MG/200ML IV SOLN
INTRAVENOUS | Status: AC
  Filled 2013-05-20: qty 200

## 2013-05-20 MED ORDER — DIPHENHYDRAMINE HCL 50 MG/ML IJ SOLN: 12.5000 mg | Freq: Four times a day (QID) | INTRAMUSCULAR | Status: DC | PRN

## 2013-05-20 MED ORDER — FENTANYL CITRATE 0.05 MG/ML IJ SOLN
INTRAMUSCULAR | Status: AC
  Filled 2013-05-20: qty 5

## 2013-05-20 MED ORDER — GLYCOPYRROLATE 0.2 MG/ML IJ SOLN
INTRAMUSCULAR | Status: AC
  Filled 2013-05-20: qty 3

## 2013-05-20 MED ORDER — SUCCINYLCHOLINE CHLORIDE 20 MG/ML IJ SOLN
INTRAMUSCULAR | Status: DC | PRN
  Administered 2013-05-20: 60 mg via INTRAVENOUS

## 2013-05-20 MED ORDER — ATORVASTATIN CALCIUM 40 MG PO TABS
40.0000 mg | ORAL_TABLET | Freq: Every day | ORAL | Status: DC
  Administered 2013-05-20: 40 mg via ORAL
  Filled 2013-05-20: qty 1

## 2013-05-20 MED ORDER — MIDAZOLAM HCL 2 MG/2ML IJ SOLN
INTRAMUSCULAR | Status: AC
  Filled 2013-05-20: qty 2

## 2013-05-20 MED ORDER — KCL IN DEXTROSE-NACL 20-5-0.45 MEQ/L-%-% IV SOLN
INTRAVENOUS | Status: DC
  Administered 2013-05-20 – 2013-05-25 (×6): via INTRAVENOUS
  Filled 2013-05-20 (×10): qty 1000

## 2013-05-20 MED ORDER — AMLODIPINE BESYLATE 5 MG PO TABS
5.0000 mg | ORAL_TABLET | Freq: Every day | ORAL | Status: DC
  Administered 2013-05-21 – 2013-05-25 (×5): 5 mg via ORAL
  Filled 2013-05-20 (×5): qty 1

## 2013-05-20 MED ORDER — DEXAMETHASONE SODIUM PHOSPHATE 10 MG/ML IJ SOLN
INTRAMUSCULAR | Status: AC
  Filled 2013-05-20: qty 1

## 2013-05-20 MED ORDER — HYDROMORPHONE HCL PF 2 MG/ML IJ SOLN
INTRAMUSCULAR | Status: AC
  Filled 2013-05-20: qty 1

## 2013-05-20 MED ORDER — LIDOCAINE HCL (CARDIAC) 20 MG/ML IV SOLN
INTRAVENOUS | Status: DC | PRN
  Administered 2013-05-20: 60 mg via INTRAVENOUS

## 2013-05-20 MED ORDER — MEGESTROL ACETATE 40 MG/ML PO SUSP
400.0000 mg | Freq: Two times a day (BID) | ORAL | Status: DC
  Administered 2013-05-21 – 2013-05-25 (×10): 400 mg via ORAL
  Filled 2013-05-20 (×11): qty 10

## 2013-05-20 MED ORDER — METRONIDAZOLE IN NACL 5-0.79 MG/ML-% IV SOLN
500.0000 mg | Freq: Once | INTRAVENOUS | Status: AC
  Administered 2013-05-20: 500 mg via INTRAVENOUS

## 2013-05-20 MED ORDER — ENOXAPARIN SODIUM 40 MG/0.4ML ~~LOC~~ SOLN
40.0000 mg | SUBCUTANEOUS | Status: DC
  Administered 2013-05-21 – 2013-05-24 (×4): 40 mg via SUBCUTANEOUS
  Filled 2013-05-20 (×5): qty 0.4

## 2013-05-20 MED ORDER — LACTATED RINGERS IV SOLN
INTRAVENOUS | Status: DC | PRN
  Administered 2013-05-20: 09:00:00 via INTRAVENOUS

## 2013-05-20 MED ORDER — LIDOCAINE HCL (CARDIAC) 20 MG/ML IV SOLN
INTRAVENOUS | Status: AC
  Filled 2013-05-20: qty 5

## 2013-05-20 MED ORDER — HYDROMORPHONE HCL PF 1 MG/ML IJ SOLN
0.2500 mg | INTRAMUSCULAR | Status: DC | PRN
  Administered 2013-05-20 (×2): 0.5 mg via INTRAVENOUS

## 2013-05-20 MED ORDER — LACTATED RINGERS IV SOLN
INTRAVENOUS | Status: DC | PRN
  Administered 2013-05-20 (×2): via INTRAVENOUS

## 2013-05-20 MED ORDER — NALOXONE HCL 0.4 MG/ML IJ SOLN: 0.4000 mg | INTRAMUSCULAR | Status: DC | PRN

## 2013-05-20 MED ORDER — PROPOFOL 10 MG/ML IV BOLUS
INTRAVENOUS | Status: DC | PRN
  Administered 2013-05-20: 70 mg via INTRAVENOUS
  Administered 2013-05-20: 20 mg via INTRAVENOUS

## 2013-05-20 MED ORDER — GLYCOPYRROLATE 0.2 MG/ML IJ SOLN
INTRAMUSCULAR | Status: DC | PRN
  Administered 2013-05-20: .4 mg via INTRAVENOUS

## 2013-05-20 MED ORDER — ROCURONIUM BROMIDE 100 MG/10ML IV SOLN
INTRAVENOUS | Status: DC | PRN
  Administered 2013-05-20: 10 mg via INTRAVENOUS
  Administered 2013-05-20: 5 mg via INTRAVENOUS
  Administered 2013-05-20: 20 mg via INTRAVENOUS

## 2013-05-20 MED ORDER — DIPHENHYDRAMINE HCL 12.5 MG/5ML PO ELIX: 12.5000 mg | ORAL_SOLUTION | Freq: Four times a day (QID) | ORAL | Status: DC | PRN

## 2013-05-20 MED ORDER — EPHEDRINE SULFATE 50 MG/ML IJ SOLN
INTRAMUSCULAR | Status: DC | PRN
  Administered 2013-05-20: 5 mg via INTRAVENOUS
  Administered 2013-05-20: 2.5 mg via INTRAVENOUS

## 2013-05-20 MED ORDER — AMLODIPINE BESYLATE 5 MG PO TABS
5.0000 mg | ORAL_TABLET | Freq: Every day | ORAL | Status: DC
  Administered 2013-05-20: 5 mg via ORAL
  Filled 2013-05-20: qty 1

## 2013-05-20 MED ORDER — HYDROMORPHONE HCL PF 1 MG/ML IJ SOLN
INTRAMUSCULAR | Status: AC
  Filled 2013-05-20: qty 1

## 2013-05-20 MED ORDER — LACTATED RINGERS IV SOLN
INTRAVENOUS | Status: DC
  Administered 2013-05-20: 1000 mL via INTRAVENOUS

## 2013-05-20 MED ORDER — ONDANSETRON HCL 4 MG/2ML IJ SOLN
INTRAMUSCULAR | Status: AC
  Filled 2013-05-20: qty 2

## 2013-05-20 MED ORDER — PROPOFOL 10 MG/ML IV BOLUS
INTRAVENOUS | Status: AC
  Filled 2013-05-20: qty 20

## 2013-05-20 MED ORDER — METOPROLOL TARTRATE 1 MG/ML IV SOLN
INTRAVENOUS | Status: DC | PRN
  Administered 2013-05-20: 2.5 mg via INTRAVENOUS
  Administered 2013-05-20: 5 mg via INTRAVENOUS

## 2013-05-20 MED ORDER — ONDANSETRON HCL 4 MG/2ML IJ SOLN
INTRAMUSCULAR | Status: DC | PRN
  Administered 2013-05-20: 4 mg via INTRAVENOUS

## 2013-05-20 MED ORDER — NEOSTIGMINE METHYLSULFATE 1 MG/ML IJ SOLN
INTRAMUSCULAR | Status: DC | PRN
  Administered 2013-05-20: 3 mg via INTRAVENOUS

## 2013-05-20 MED ORDER — IRBESARTAN 75 MG PO TABS
75.0000 mg | ORAL_TABLET | Freq: Every day | ORAL | Status: DC
  Administered 2013-05-21 – 2013-05-25 (×5): 75 mg via ORAL
  Filled 2013-05-20 (×5): qty 1

## 2013-05-20 MED ORDER — METRONIDAZOLE IN NACL 5-0.79 MG/ML-% IV SOLN
INTRAVENOUS | Status: AC
  Filled 2013-05-20: qty 100

## 2013-05-20 MED ORDER — HYDROMORPHONE HCL PF 1 MG/ML IJ SOLN
INTRAMUSCULAR | Status: DC | PRN
  Administered 2013-05-20: 0.5 mg via INTRAVENOUS
  Administered 2013-05-20: .5 mg via INTRAVENOUS
  Administered 2013-05-20: 1 mg via INTRAVENOUS
  Administered 2013-05-20: .5 mg via INTRAVENOUS
  Administered 2013-05-20 (×2): 0.5 mg via INTRAVENOUS
  Administered 2013-05-20: .5 mg via INTRAVENOUS

## 2013-05-20 MED ORDER — FENTANYL CITRATE 0.05 MG/ML IJ SOLN
INTRAMUSCULAR | Status: DC | PRN
  Administered 2013-05-20 (×5): 50 ug via INTRAVENOUS

## 2013-05-20 MED ORDER — ROCURONIUM BROMIDE 100 MG/10ML IV SOLN
INTRAVENOUS | Status: AC
  Filled 2013-05-20: qty 1

## 2013-05-20 MED ORDER — NEOSTIGMINE METHYLSULFATE 1 MG/ML IJ SOLN
INTRAMUSCULAR | Status: AC
  Filled 2013-05-20: qty 10

## 2013-05-20 MED ORDER — AMLODIPINE-ATORVASTATIN 5-40 MG PO TABS: 1.0000 | ORAL_TABLET | Freq: Every day | ORAL | Status: DC

## 2013-05-20 MED ORDER — CIPROFLOXACIN IN D5W 400 MG/200ML IV SOLN
400.0000 mg | Freq: Once | INTRAVENOUS | Status: AC
  Administered 2013-05-20: 400 mg via INTRAVENOUS

## 2013-05-20 MED ORDER — ONDANSETRON HCL 4 MG/2ML IJ SOLN: 4.0000 mg | Freq: Four times a day (QID) | INTRAMUSCULAR | Status: DC | PRN

## 2013-05-20 MED ORDER — HYDROMORPHONE 0.3 MG/ML IV SOLN
INTRAVENOUS | Status: DC
  Administered 2013-05-20: 20:00:00 via INTRAVENOUS
  Administered 2013-05-21: 0.4 mg via INTRAVENOUS
  Administered 2013-05-21: 1.69 mg via INTRAVENOUS
  Administered 2013-05-21: 1.39 mg via INTRAVENOUS
  Filled 2013-05-20: qty 25

## 2013-05-20 MED ORDER — ATORVASTATIN CALCIUM 40 MG PO TABS
40.0000 mg | ORAL_TABLET | Freq: Every day | ORAL | Status: DC
  Administered 2013-05-21 – 2013-05-25 (×5): 40 mg via ORAL
  Filled 2013-05-20 (×5): qty 1

## 2013-05-20 MED ORDER — 0.9 % SODIUM CHLORIDE (POUR BTL) OPTIME
TOPICAL | Status: DC | PRN
  Administered 2013-05-20: 3000 mL

## 2013-05-20 MED ORDER — SODIUM CHLORIDE 0.9 % IJ SOLN: 9.0000 mL | INTRAMUSCULAR | Status: DC | PRN

## 2013-05-20 SURGICAL SUPPLY — 47 items
BLADE EXTENDED COATED 6.5IN (ELECTRODE) IMPLANT
BLADE HEX COATED 2.75 (ELECTRODE) ×6 IMPLANT
BLADE SURG SZ10 CARB STEEL (BLADE) IMPLANT
CANISTER SUCTION 2500CC (MISCELLANEOUS) ×3 IMPLANT
COUNTER NEEDLE 20 DBL MAG RED (NEEDLE) ×3 IMPLANT
COVER MAYO STAND STRL (DRAPES) ×6 IMPLANT
DRAPE LAPAROSCOPIC ABDOMINAL (DRAPES) ×3 IMPLANT
DRAPE LG THREE QUARTER DISP (DRAPES) IMPLANT
DRAPE UTILITY XL STRL (DRAPES) ×6 IMPLANT
DRAPE WARM FLUID 44X44 (DRAPE) ×3 IMPLANT
DRSG OPSITE POSTOP 4X10 (GAUZE/BANDAGES/DRESSINGS) IMPLANT
DRSG OPSITE POSTOP 4X6 (GAUZE/BANDAGES/DRESSINGS) ×2 IMPLANT
DRSG OPSITE POSTOP 4X8 (GAUZE/BANDAGES/DRESSINGS) ×2 IMPLANT
ELECT REM PT RETURN 9FT ADLT (ELECTROSURGICAL) ×3
ELECTRODE REM PT RTRN 9FT ADLT (ELECTROSURGICAL) ×1 IMPLANT
EVACUATOR DRAINAGE 10X20 100CC (DRAIN) IMPLANT
EVACUATOR SILICONE 100CC (DRAIN)
GLOVE SURG ORTHO 8.0 STRL STRW (GLOVE) ×6 IMPLANT
GOWN STRL REUS W/TWL XL LVL3 (GOWN DISPOSABLE) ×12 IMPLANT
KIT BASIN OR (CUSTOM PROCEDURE TRAY) ×3 IMPLANT
LEGGING LITHOTOMY PAIR STRL (DRAPES) IMPLANT
LUBRICANT JELLY K Y 4OZ (MISCELLANEOUS) IMPLANT
PACK GENERAL/GYN (CUSTOM PROCEDURE TRAY) ×3 IMPLANT
PENCIL BUTTON HOLSTER BLD 10FT (ELECTRODE) ×3 IMPLANT
SPONGE GAUZE 4X4 12PLY (GAUZE/BANDAGES/DRESSINGS) IMPLANT
STAPLER CIRC CVD 29MM 37CM (STAPLE) ×2 IMPLANT
STAPLER VISISTAT 35W (STAPLE) ×3 IMPLANT
SUCTION POOLE TIP (SUCTIONS) ×3 IMPLANT
SUT ETHILON 3 0 PS 1 (SUTURE) IMPLANT
SUT NOV 1 T60/GS (SUTURE) IMPLANT
SUT NOVA NAB DX-16 0-1 5-0 T12 (SUTURE) ×16 IMPLANT
SUT NOVA T20/GS 25 (SUTURE) IMPLANT
SUT PROLENE 2 0 SH DA (SUTURE) ×2 IMPLANT
SUT SILK 2 0 (SUTURE) ×3
SUT SILK 2 0 SH CR/8 (SUTURE) ×3 IMPLANT
SUT SILK 2-0 18XBRD TIE 12 (SUTURE) ×1 IMPLANT
SUT SILK 3 0 (SUTURE)
SUT SILK 3 0 SH CR/8 (SUTURE) ×3 IMPLANT
SUT SILK 3-0 18XBRD TIE 12 (SUTURE) ×1 IMPLANT
SUT VIC AB 3-0 SH 18 (SUTURE) ×2 IMPLANT
SUT VIC AB 4-0 SH 18 (SUTURE) IMPLANT
TOWEL OR 17X26 10 PK STRL BLUE (TOWEL DISPOSABLE) ×6 IMPLANT
TOWEL OR NON WOVEN STRL DISP B (DISPOSABLE) ×6 IMPLANT
TRAY FOLEY CATH 14FRSI W/METER (CATHETERS) ×3 IMPLANT
TUBING CONNECTING 10 (TUBING) IMPLANT
TUBING CONNECTING 10' (TUBING)
YANKAUER SUCT BULB TIP 10FT TU (MISCELLANEOUS) ×3 IMPLANT

## 2013-05-20 NOTE — Anesthesia Postprocedure Evaluation (Signed)
  Anesthesia Post-op Note  Patient: [name]  Procedure(s) Performed: Procedure(s) (LRB): HERNIA REPAIR PARASTOMAL (N/A) COLOSTOMY CLOSURE (N/A)  Patient Location: PACU  Anesthesia Type: General  Level of Consciousness: awake and alert   Airway and Oxygen Therapy: Patient Spontanous Breathing  Post-op Pain: mild  Post-op Assessment: Post-op Vital signs reviewed, Patient's Cardiovascular Status Stable, Respiratory Function Stable, Patent Airway and No signs of Nausea or vomiting  Last Vitals:  Filed Vitals:   05/20/13 1445  BP: 154/63  Pulse: 66  Temp:   Resp: 8    Post-op Vital Signs: stable   Complications: No apparent anesthesia complications

## 2013-05-20 NOTE — Op Note (Signed)
NAMECHARLOTT, [name]               ACCOUNT NO.:  [id number]  MEDICAL RECORD NO.:  [id number]  LOCATION:  3086                         FACILITY:  [facility]  PHYSICIAN:  Earnstine Regal, MD      DATE OF BIRTH:  [birth year]  DATE OF PROCEDURE:  05/20/2013                               OPERATIVE REPORT   PREOPERATIVE DIAGNOSES:  History of diverticular disease, parastomal hernia.  POSTOPERATIVE DIAGNOSES:  History of diverticular disease, parastomal hernia.  PROCEDURE: 1. Exploratory laparotomy. 2. Takedown of colostomy. 3. Repair of parastomal hernia.  SURGEON:  Earnstine Regal, MD, FACS  ASSISTANT:  Adin Hector, MD, FACS  ANESTHESIA:  General per Dr. Rod Mae  ESTIMATED BLOOD LOSS:  Minimal.  PREPARATION:  ChloraPrep.  COMPLICATIONS:  None.  INDICATIONS:  The patient is a 80 year old female known to my surgical practice.  She underwent Hartmann's procedure in 2013 for perforated sigmoid diverticulitis with sepsis.  After a prolonged postoperative recovery, the patient has done well.  Over the past year, she has had intermittent episodes of intestinal and gastric obstruction due to parastomal hernia.  She was admitted 1 week ago with obstruction in the distal stomach due to herniation through the parastomal hernia.  She resolved with nasogastric decompression and conservative management. Decision was made to proceed with colostomy closure and repair of parastomal hernia during this admission.  She underwent an upper endoscopy followed by colonoscopy by Gastroenterology.  She is now prepared and brought to the operating room for colostomy closure and repair of parastomal hernia.  BODY OF REPORT:  Procedure was done in OR #6 at the [facility].  The patient was brought to the operating room, placed in supine position on the operating room table.  Following administration of general anesthesia, the patient was positioned in lithotomy and prepped and  draped in the usual aseptic fashion.  After ascertaining that an adequate level of anesthesia had been achieved, the midline surgical incision was reopened with a #10 blade.  Suture material was extracted.  Peritoneal cavity was entered cautiously. Adhesions to the anterior abdominal wall were taken down with sharp dissection and use of the electrocautery.  An elliptical incision was made around the colostomy and the colostomy was completely mobilized and reduced back within the peritoneal cavity.  Adhesions were lysed.  Small bowel adhesions were then lysed.  Small bowel loops were adherent in the pelvis.  These were gently mobilized with sharp dissection until the entire pelvis was clear.  The rectal stump was identified.  Rigid proctoscopy was performed.  The splenic flexure was taken down and mobilized to allow the colostomy to reach all the way to the low pelvis without tension.  The stoma was amputated and mesentery ligated with 2-0 silk ties.  A 2-0 Prolene pursestring suture was placed in the end of the descending colon.  A 29 mm EEA stapler was selected.  Anvil was inserted into the distal colon and the pursestring suture tied securely. The EEA stapler was then advanced into the rectum and up to near the cuff of the Hartmann's pouch.  The stapler was brought out through the anterior wall of the rectum.  The  trocar was advanced and a stapler was reassembled, closed, and fired.  The stapler was withdrawn and there were 2 complete rings noted proximally and distally.  The colon was occluded and rigid sigmoidoscopy was performed showing a patent anastomosis and no evidence of air leak under pressure.  Scope was withdrawn and air was evacuated from the rectum.  Abdomen was irrigated with warm saline.  Per the colon Protocol, gowns and gloves were changed.  Instruments were changed.  The stomal fascial defect was freed circumferentially.  It was closed in a side-to-side fashion with  interrupted #1 Novafil simple sutures.  The midline abdominal incision was then closed with interrupted #1 Novafil simple sutures.  Subcutaneous tissues were irrigated.  Subcutaneous tissues at the site of the colostomy were closed with interrupted 3-0 Vicryl sutures.  Skin of both incisions was closed with stainless steel staples.  Sterile dressings were applied.  The patient was awakened from anesthesia and brought to the recovery room.  The patient tolerated the procedure well.   Earnstine Regal, MD, [facility] Office: [phone]    TMG/MEDQ  D:  05/20/2013  T:  05/20/2013  Job:  [id number]

## 2013-05-20 NOTE — Brief Op Note (Signed)
05/13/2013 - 05/20/2013  1:23 PM  PATIENT:  [name]  80 y.o. female  PRE-OPERATIVE DIAGNOSIS:  parastomal hernia, diverticular disease  POST-OPERATIVE DIAGNOSIS:  parastomal hernia, diverticular disease  PROCEDURE:  Procedure(s): HERNIA REPAIR PARASTOMAL (N/A) COLOSTOMY CLOSURE (N/A)  SURGEON:  Surgeon(s) and Role:    * Earnstine Regal, MD - Primary    * Adin Hector, MD - Assisting  ANESTHESIA:   general  EBL:  Total I/O In: 1000 [I.V.:1000] Out: 750 [Urine:700; Blood:50]  BLOOD ADMINISTERED:none  DRAINS: none   LOCAL MEDICATIONS USED:  NONE  SPECIMEN:  Excision  DISPOSITION OF SPECIMEN:  PATHOLOGY  COUNTS:  YES  TOURNIQUET:  * No tourniquets in log *  DICTATION: .Other Dictation: Dictation Number [id number]  PLAN OF CARE: Admit to inpatient   PATIENT DISPOSITION:  PACU - hemodynamically stable.   Delay start of Pharmacological VTE agent (>24hrs) due to surgical blood loss or risk of bleeding: yes  Earnstine Regal, MD, [facility] Office: [phone]

## 2013-05-20 NOTE — Interval H&P Note (Signed)
History and Physical Interval Note:  05/20/2013 1:15 PM  [name]  has presented today for surgery, with the diagnosis of parastomal hernia, diverticular disease.  The various methods of treatment have been discussed with the patient and family. After consideration of risks, benefits and other options for treatment, the patient has consented to    Procedure(s): HERNIA REPAIR PARASTOMAL (N/A) COLOSTOMY CLOSURE (N/A) as a surgical intervention .    The patient's history has been reviewed, patient examined, no change in status, stable for surgery.  I have reviewed the patient's chart and labs.  Questions were answered to the patient's   Earnstine Regal, MD, [facility] Office: [place]

## 2013-05-20 NOTE — Anesthesia Preprocedure Evaluation (Addendum)
Anesthesia Evaluation  Patient identified by MRN, date of birth, ID band Patient awake    Reviewed: Allergy & Precautions, H&P , NPO status , Patient's Chart, lab work & pertinent test results, reviewed documented beta blocker date and time   History of Anesthesia Complications (+) PONV  Airway Mallampati: II TM Distance: >3 FB Neck ROM: full    Dental no notable dental hx. (+) Teeth Intact, Dental Advisory Given   Pulmonary neg pulmonary ROS,  breath sounds clear to auscultation  Pulmonary exam normal       Cardiovascular hypertension, Pt. on home beta blockers and Pt. on medications +CHF + dysrhythmias Atrial Fibrillation Rhythm:regular Rate:Normal  Tachycardia.  History acute systolic CHF   Neuro/Psych TIAnegative psych ROS   GI/Hepatic negative GI ROS, Neg liver ROS,   Endo/Other  negative endocrine ROS  Renal/GU negative Renal ROS  negative genitourinary   Musculoskeletal   Abdominal   Peds  Hematology lymphoma   Anesthesia Other Findings   Reproductive/Obstetrics negative OB ROS                          Anesthesia Physical Anesthesia Plan  ASA: III  Anesthesia Plan: General   Post-op Pain Management:    Induction: Intravenous  Airway Management Planned: Oral ETT  Additional Equipment:   Intra-op Plan:   Post-operative Plan: Extubation in OR  Informed Consent: I have reviewed the patients History and Physical, chart, labs and discussed the procedure including the risks, benefits and alternatives for the proposed anesthesia with the patient or authorized representative who has indicated his/her understanding and acceptance.   Dental Advisory Given  Plan Discussed with: CRNA and Surgeon  Anesthesia Plan Comments:         Anesthesia Quick Evaluation

## 2013-05-20 NOTE — Transfer of Care (Signed)
Immediate Anesthesia Transfer of Care Note  Patient: [name]  Procedure(s) Performed: Procedure(s) (LRB): HERNIA REPAIR PARASTOMAL (N/A) COLOSTOMY CLOSURE (N/A)  Patient Location: PACU  Anesthesia Type: General  Level of Consciousness: sedated, patient cooperative and responds to stimulation  Airway & Oxygen Therapy: Patient Spontanous Breathing and Patient connected to face mask oxgen  Post-op Assessment: Report given to PACU RN and Post -op Vital signs reviewed and stable  Post vital signs: Reviewed and stable  Complications: No apparent anesthesia complications

## 2013-05-20 NOTE — H&P (View-Only) (Signed)
General Surgery [facility]  Reason for Consult:  Abdominal pain, parastomal hernia with obstruction  Referring Physician: Dr. Teressa Lower, ER, [facility]  [name] is an 80 y.o. female.   HPI: patient is a 80 yo WF well known to my practice.  History of Hartmann's procedure for perforated diverticular disease in 2013.  Prolonged post op recovery.  Now with parastomal hernia.  Two hospitalizations for abdominal pain and partial obstruction in 2014.  Now presents with abdominal pain and enlarging parastomal hernia - CT shows partial gastric herniation with obstruction.  NG decompression has relieved symptoms and hernia has reduced.  Discussed management with ER physician early this AM after reviewing CT scan.  I have seen and examined patient this morning and reviewed CT scan with patient's husband and son (physician).  She is much improved this AM.  Past Medical History  Diagnosis Date  . Cancer 2/95/62    follicular B cell lymphoma  . TIA (transient ischemic attack)   . Arthritis   . Hypertension   . Esophageal abnormality     strictures  . Arthritis 09/04/2011  . Dehydration 11/20/2011  . Diarrhea 11/20/2011  . Complication of anesthesia   . PONV (postoperative nausea and vomiting)     in the 1980's  . GERD (gastroesophageal reflux disease)   . Esophageal stricture     x 2  . Septic shock(785.52) 11/22/2011  . Candida esophagitis 01/06/2012  . Diverticulitis of colon with perforation 11/21/2011  . Osteoporosis     Past Surgical History  Procedure Laterality Date  . Appendectomy    . Parotidectomy w/ neck dissection total    . Tonsillectomy    . Abdominal hysterectomy      left ovaries  . Laparotomy  11/22/2011    Procedure: EXPLORATORY LAPAROTOMY;  Surgeon: Earnstine Regal, MD;  Location: [facility];  Service: General;  Laterality: N/A;  drainage of pelvic abscess  . Colostomy revision  11/22/2011    Procedure: COLON RESECTION SIGMOID;  Surgeon: Earnstine Regal, MD;  Location: [facility];  Service: General;  Laterality: N/A;  . Colostomy  11/22/2011    Procedure: COLOSTOMY;  Surgeon: Earnstine Regal, MD;  Location: [facility];  Service: General;  Laterality: N/A;  desending colostomy  . Esophagogastroduodenoscopy  01/06/2012    Procedure: ESOPHAGOGASTRODUODENOSCOPY (EGD);  Surgeon: Milus Banister, MD;  Location: [facility];  Service: Endoscopy;  Laterality: N/A;    Family History  Problem Relation Age of Onset  . Cancer Mother 33    gastric  . Hypertension Father     heart disease  . Cancer Maternal Aunt 65    breast cancer  . Cancer Maternal Uncle 53    colon    Social History:  reports that she has never smoked. She has never used smokeless tobacco. She reports that she does not drink alcohol or use illicit drugs.  Allergies:  Allergies  Allergen Reactions  . Codeine Hives  . Other Nausea And Vomiting    anesthesia  . Penicillins Hives  . Sulfa Antibiotics Hives    Medications: I have reviewed the patient's current medications.  Results for orders placed during the hospital encounter of 05/13/13 (from the past 48 hour(s))  CBC     Status: None   Collection Time    05/13/13  8:26 PM      Result Value Ref Range   WBC 7.7  4.0 - 10.5 K/uL   RBC 4.29  3.87 -  5.11 MIL/uL   Hemoglobin 13.1  12.0 - 15.0 g/dL   HCT 38.4  36.0 - 46.0 %   MCV 89.5  78.0 - 100.0 fL   MCH 30.5  26.0 - 34.0 pg   MCHC 34.1  30.0 - 36.0 g/dL   RDW 13.2  11.5 - 15.5 %   Platelets 235  150 - 400 K/uL  COMPREHENSIVE METABOLIC PANEL     Status: Abnormal   Collection Time    05/13/13  8:26 PM      Result Value Ref Range   Sodium 141  137 - 147 mEq/L   Potassium 3.1 (*) 3.7 - 5.3 mEq/L   Chloride 101  96 - 112 mEq/L   CO2 24  19 - 32 mEq/L   Glucose, Bld 119 (*) 70 - 99 mg/dL   BUN 19  6 - 23 mg/dL   Creatinine, Ser 0.81  0.50 - 1.10 mg/dL   Calcium 9.2  8.4 - 10.5 mg/dL   Total Protein 6.9  6.0 - 8.3 g/dL   Albumin 4.7  3.5 - 5.2 g/dL   AST 22  0 -  37 U/L   ALT 19  0 - 35 U/L   Alkaline Phosphatase 43  39 - 117 U/L   Total Bilirubin 0.6  0.3 - 1.2 mg/dL   GFR calc non Af Amer 67 (*) >90 mL/min   GFR calc Af Amer 78 (*) >90 mL/min   Comment: (NOTE)     The eGFR has been calculated using the CKD EPI equation.     This calculation has not been validated in all clinical situations.     eGFR's persistently <90 mL/min signify possible Chronic Kidney     Disease.  LIPASE, BLOOD     Status: Abnormal   Collection Time    05/13/13  8:26 PM      Result Value Ref Range   Lipase 66 (*) 11 - 59 U/L  I-STAT CHEM 8, ED     Status: Abnormal   Collection Time    05/13/13 11:33 PM      Result Value Ref Range   Sodium 145  137 - 147 mEq/L   Potassium 3.0 (*) 3.7 - 5.3 mEq/L   Chloride 105  96 - 112 mEq/L   BUN 19  6 - 23 mg/dL   Creatinine, Ser 0.80  0.50 - 1.10 mg/dL   Glucose, Bld 117 (*) 70 - 99 mg/dL   Calcium, Ion 1.10 (*) 1.13 - 1.30 mmol/L   TCO2 24  0 - 100 mmol/L   Hemoglobin 13.6  12.0 - 15.0 g/dL   HCT 40.0  36.0 - 46.0 %  URINALYSIS, ROUTINE W REFLEX MICROSCOPIC     Status: Abnormal   Collection Time    05/13/13 11:36 PM      Result Value Ref Range   Color, Urine YELLOW  YELLOW   APPearance CLEAR  CLEAR   Specific Gravity, Urine 1.011  1.005 - 1.030   pH 8.0  5.0 - 8.0   Glucose, UA NEGATIVE  NEGATIVE mg/dL   Hgb urine dipstick TRACE (*) NEGATIVE   Bilirubin Urine NEGATIVE  NEGATIVE   Ketones, ur NEGATIVE  NEGATIVE mg/dL   Protein, ur NEGATIVE  NEGATIVE mg/dL   Urobilinogen, UA 0.2  0.0 - 1.0 mg/dL   Nitrite NEGATIVE  NEGATIVE   Leukocytes, UA NEGATIVE  NEGATIVE  URINE MICROSCOPIC-ADD ON     Status: None   Collection Time    05/13/13  11:36 PM      Result Value Ref Range   Squamous Epithelial / LPF RARE  RARE   RBC / HPF 3-6  <3 RBC/hpf  MAGNESIUM     Status: None   Collection Time    05/14/13  5:15 AM      Result Value Ref Range   Magnesium 2.2  1.5 - 2.5 mg/dL  PHOSPHORUS     Status: None   Collection Time     05/14/13  5:15 AM      Result Value Ref Range   Phosphorus 2.6  2.3 - 4.6 mg/dL  COMPREHENSIVE METABOLIC PANEL     Status: Abnormal   Collection Time    05/14/13  5:15 AM      Result Value Ref Range   Sodium 139  137 - 147 mEq/L   Potassium 3.6 (*) 3.7 - 5.3 mEq/L   Chloride 102  96 - 112 mEq/L   CO2 24  19 - 32 mEq/L   Glucose, Bld 132 (*) 70 - 99 mg/dL   BUN 15  6 - 23 mg/dL   Creatinine, Ser 0.76  0.50 - 1.10 mg/dL   Calcium 8.8  8.4 - 10.5 mg/dL   Total Protein 6.3  6.0 - 8.3 g/dL   Albumin 4.2  3.5 - 5.2 g/dL   AST 21  0 - 37 U/L   ALT 17  0 - 35 U/L   Alkaline Phosphatase 41  39 - 117 U/L   Total Bilirubin 0.5  0.3 - 1.2 mg/dL   GFR calc non Af Amer 78 (*) >90 mL/min   GFR calc Af Amer >90  >90 mL/min   Comment: (NOTE)     The eGFR has been calculated using the CKD EPI equation.     This calculation has not been validated in all clinical situations.     eGFR's persistently <90 mL/min signify possible Chronic Kidney     Disease.  CBC     Status: None   Collection Time    05/14/13  5:15 AM      Result Value Ref Range   WBC 9.3  4.0 - 10.5 K/uL   RBC 4.14  3.87 - 5.11 MIL/uL   Hemoglobin 12.6  12.0 - 15.0 g/dL   HCT 37.3  36.0 - 46.0 %   MCV 90.1  78.0 - 100.0 fL   MCH 30.4  26.0 - 34.0 pg   MCHC 33.8  30.0 - 36.0 g/dL   RDW 13.2  11.5 - 15.5 %   Platelets 244  150 - 400 K/uL    Ct Abdomen Pelvis W Contrast  05/14/2013   CLINICAL DATA:  Lymphoma.  EXAM: CT ABDOMEN AND PELVIS WITH CONTRAST  TECHNIQUE: Multidetector CT imaging of the abdomen and pelvis was performed using the standard protocol following bolus administration of intravenous contrast.  CONTRAST:  38m OMNIPAQUE IOHEXOL 300 MG/ML SOLN, 81mOMNIPAQUE IOHEXOL 300 MG/ML SOLN  COMPARISON:  NM BONE WHOLE BODY dated 01/18/2013; CT ABD/PELVIS W CM dated 11/11/2012  FINDINGS: Punctate stable hepatic lucencies most consistent with simple cysts. No change from 11/11/2012. Spleen normal. Pancreas and biliary system  unremarkable.  Adrenals normal. Tiny stable punctate lucencies in the right kidney consistent with cysts. These are too small however for accurate evaluation . No hydronephrosis or hydroureter. The bladder is nondistended. Calcifications noted within the pelvis consistent with phleboliths. No pelvic masses. No free pelvic fluid collections are. Prior hysterectomy.  No inguinal or retroperitoneal adenopathy. The  abdominal aorta normal in caliber. Visceral vessels are patent. Portal vein is patent.  Appendectomy. Through the previously identified colostomy site there is prominent gastric herniation with associated gastric distention. Compression of adjacent colon appears to be present. No small bowel or colonic distention however is noted. Stool is noted throughout the colon. No free air noted. No pneumatosis or portal venous gas.  Heart size is normal. Passive atelectasis lung bases. Degenerative changes lumbar spine and both hips. Retrolisthesis of L2 on L3 again noted and unchanged.  IMPRESSION: 1. Gastric herniation through the left colostomy site with severe gastric distention most likely from obstruction. Compression of adjacent herniated colon is noted. No other bowel distention is noted. Surgical evaluation should be considered. These findings are new from prior exam. 2. No acute abnormality otherwise noted.   Electronically Signed   By: Marcello Moores  Register   On: 05/14/2013 00:54    Review of Systems  Constitutional: Negative.   HENT: Negative.   Eyes: Negative.   Respiratory: Negative.   Cardiovascular: Negative.   Gastrointestinal: Positive for nausea and abdominal pain.  Genitourinary: Negative.   Musculoskeletal: Negative.   Skin: Negative.   Neurological: Negative.   Endo/Heme/Allergies: Negative.   Psychiatric/Behavioral: Negative.    Blood pressure 125/57, pulse 70, temperature 98.1 F (36.7 C), temperature source Oral, resp. rate 16, height _0  (1.499 m), weight 94 lb 9.2 oz (42.9  kg), SpO2 99.00%. Physical Exam  Constitutional: She is oriented to person, place, and time. She appears well-developed and well-nourished. No distress.  HENT:  Head: Normocephalic and atraumatic.  Right Ear: External ear normal.  Left Ear: External ear normal.  Eyes: Conjunctivae are normal. Pupils are equal, round, and reactive to light. No scleral icterus.  Neck: Normal range of motion. Neck supple. No thyromegaly present.  Tracheostomy site scar  Cardiovascular: Normal rate, regular rhythm and normal heart sounds.   Respiratory: Effort normal and breath sounds normal. She has no wheezes.  GI: Soft. Bowel sounds are normal. She exhibits no distension and no mass. There is no tenderness. There is no rebound and no guarding.  Stoma in left lower quadrant; soft, parastomal hernia completely reduced.  No tenderness.  Musculoskeletal: Normal range of motion. She exhibits no edema.  Neurological: She is alert and oriented to person, place, and time.  Skin: Skin is warm and dry.  Psychiatric: She has a normal mood and affect. Her behavior is normal.    Assessment/Plan: 1. Parastomal hernia with upper GI obstruction  Much improved with NG decompression and reduction of hernia  Continue NG decompression and IV hydration  Hold Plavix in anticipation of operative/endoscopic intervention 2. Gastric outlet obstruction  Likely secondary to herniation, but should rule out other possible causes (PUD, neoplasm)  Will contact Dr. Ronald Lobo for consultation and possible EGD exam 3. Atrial fibrillation / CHF  Stable - appreciate medical service management 4. History of lymphoma  No sign of recurrence at this time  Discussed at bedside with patient, husband, and son.  Will ask GI (Dr. Cristina Gong) to evaluate.  Will consider bowel prep and reversal of colostomy with repair of hernia during this admission.  Earnstine Regal, MD, [facility] Office:  [facility] 05/14/2013, 10:02 AM

## 2013-05-21 ENCOUNTER — Encounter (HOSPITAL_COMMUNITY): Payer: Self-pay | Admitting: Surgery

## 2013-05-21 DIAGNOSIS — M542 Cervicalgia: Secondary | ICD-10-CM | POA: Diagnosis present

## 2013-05-21 DIAGNOSIS — M25519 Pain in unspecified shoulder: Secondary | ICD-10-CM | POA: Clinically undetermined

## 2013-05-21 DIAGNOSIS — I10 Essential (primary) hypertension: Secondary | ICD-10-CM | POA: Diagnosis present

## 2013-05-21 LAB — CBC
HEMATOCRIT: 33.3 % — AB (ref 36.0–46.0)
HEMOGLOBIN: 11.7 g/dL — AB (ref 12.0–15.0)
MCH: 31.4 pg (ref 26.0–34.0)
MCHC: 35.1 g/dL (ref 30.0–36.0)
MCV: 89.3 fL (ref 78.0–100.0)
Platelets: 243 10*3/uL (ref 150–400)
RBC: 3.73 MIL/uL — AB (ref 3.87–5.11)
RDW: 12.8 % (ref 11.5–15.5)
WBC: 13.9 10*3/uL — AB (ref 4.0–10.5)

## 2013-05-21 LAB — BASIC METABOLIC PANEL
BUN: 5 mg/dL — AB (ref 6–23)
CHLORIDE: 105 meq/L (ref 96–112)
CO2: 19 meq/L (ref 19–32)
Calcium: 7.1 mg/dL — ABNORMAL LOW (ref 8.4–10.5)
Creatinine, Ser: 0.67 mg/dL (ref 0.50–1.10)
GFR calc non Af Amer: 81 mL/min — ABNORMAL LOW (ref >90)
Glucose, Bld: 136 mg/dL — ABNORMAL HIGH (ref 70–99)
POTASSIUM: 4.6 meq/L (ref 3.7–5.3)
Sodium: 136 mEq/L — ABNORMAL LOW (ref 137–147)

## 2013-05-21 MED ORDER — LACTATED RINGERS IV BOLUS (SEPSIS): 1000.0000 mL | Freq: Three times a day (TID) | INTRAVENOUS | Status: DC | PRN

## 2013-05-21 MED ORDER — SACCHAROMYCES BOULARDII 250 MG PO CAPS
250.0000 mg | ORAL_CAPSULE | Freq: Two times a day (BID) | ORAL | Status: DC
  Administered 2013-05-21 – 2013-05-25 (×7): 250 mg via ORAL
  Filled 2013-05-21 (×10): qty 1

## 2013-05-21 MED ORDER — NALOXONE HCL 0.4 MG/ML IJ SOLN: 0.4000 mg | INTRAMUSCULAR | Status: DC | PRN

## 2013-05-21 MED ORDER — DIPHENHYDRAMINE HCL 12.5 MG/5ML PO ELIX: 12.5000 mg | ORAL_SOLUTION | Freq: Four times a day (QID) | ORAL | Status: DC | PRN

## 2013-05-21 MED ORDER — FENTANYL 50 MCG/HR TD PT72
50.0000 ug | MEDICATED_PATCH | TRANSDERMAL | Status: DC
  Administered 2013-05-22: 50 ug via TRANSDERMAL
  Filled 2013-05-21: qty 1

## 2013-05-21 MED ORDER — HYDROMORPHONE HCL PF 1 MG/ML IJ SOLN: 0.5000 mg | INTRAMUSCULAR | Status: DC | PRN

## 2013-05-21 MED ORDER — HYDROMORPHONE 0.3 MG/ML IV SOLN: INTRAVENOUS | Status: DC

## 2013-05-21 MED ORDER — FENTANYL CITRATE 0.05 MG/ML IJ SOLN
25.0000 ug | INTRAMUSCULAR | Status: DC | PRN
  Administered 2013-05-22: 50 ug via INTRAVENOUS
  Filled 2013-05-21: qty 2

## 2013-05-21 MED ORDER — FENTANYL 10 MCG/ML IV SOLN
INTRAVENOUS | Status: DC
  Administered 2013-05-21: 75 ug via INTRAVENOUS
  Administered 2013-05-21: 120 ug/h via INTRAVENOUS
  Administered 2013-05-21: 14:00:00 via INTRAVENOUS
  Administered 2013-05-22: 135 ug/h via INTRAVENOUS
  Administered 2013-05-22: 35 ug/h via INTRAVENOUS
  Administered 2013-05-22: 195 ug via INTRAVENOUS
  Administered 2013-05-22: 120 ug via INTRAVENOUS
  Administered 2013-05-23: 225 ug via INTRAVENOUS
  Administered 2013-05-23: 15 ug via INTRAVENOUS
  Filled 2013-05-21 (×3): qty 50

## 2013-05-21 MED ORDER — SODIUM CHLORIDE 0.9 % IJ SOLN: 9.0000 mL | INTRAMUSCULAR | Status: DC | PRN

## 2013-05-21 MED ORDER — ACETAMINOPHEN 500 MG PO TABS
1000.0000 mg | ORAL_TABLET | Freq: Three times a day (TID) | ORAL | Status: DC
  Administered 2013-05-21 – 2013-05-22 (×5): 1000 mg via ORAL
  Filled 2013-05-21 (×9): qty 2

## 2013-05-21 MED ORDER — DIPHENHYDRAMINE HCL 50 MG/ML IJ SOLN: 12.5000 mg | Freq: Four times a day (QID) | INTRAMUSCULAR | Status: DC | PRN

## 2013-05-21 MED ORDER — SODIUM CHLORIDE 0.9 % IJ SOLN
9.0000 mL | INTRAMUSCULAR | Status: DC | PRN
Start: 2013-05-21 — End: 2013-05-23

## 2013-05-21 MED ORDER — ALUM & MAG HYDROXIDE-SIMETH 200-200-20 MG/5ML PO SUSP
30.0000 mL | Freq: Four times a day (QID) | ORAL | Status: DC | PRN
Start: 2013-05-21 — End: 2013-05-25

## 2013-05-21 MED ORDER — EXERCISE FOR HEART AND HEALTH BOOK
Freq: Once | Status: DC
  Filled 2013-05-21: qty 1

## 2013-05-21 MED ORDER — MAGIC MOUTHWASH
15.0000 mL | Freq: Four times a day (QID) | ORAL | Status: DC | PRN
  Filled 2013-05-21: qty 15

## 2013-05-21 MED ORDER — LIP MEDEX EX OINT
1.0000 "application " | TOPICAL_OINTMENT | Freq: Two times a day (BID) | CUTANEOUS | Status: DC
  Administered 2013-05-21 – 2013-05-25 (×9): 1 via TOPICAL
  Filled 2013-05-21 (×2): qty 7

## 2013-05-21 MED ORDER — ONDANSETRON HCL 4 MG/2ML IJ SOLN: 4.0000 mg | Freq: Four times a day (QID) | INTRAMUSCULAR | Status: DC | PRN

## 2013-05-21 NOTE — Progress Notes (Signed)
[place], MD, [address] [place], [place] [address] Phone: [phone] FAX: [phone]    [name] [id number] Nov 01, 1931  CARE TEAM:  PCP: Marco Collie, MD  Outpatient Care Team: Patient Care Team: Marco Collie as PCP - General (Family Medicine) Earnstine Regal, MD as Consulting Physician (General Surgery) Cleotis Nipper, MD as Consulting Physician (Gastroenterology) Deatra Robinson, MD as Consulting Physician (Medical Oncology)  Inpatient Treatment Team: Treatment Team: Attending Provider: Earnstine Regal, MD; Consulting Physician: Wonda Horner, MD; Technician: Audie Box, NT; Technician: Daleen Squibb, [place]; Registered Nurse: Charlyne Mom, RN; Registered Nurse: Gomez Cleverly, RN; Technician: Andres Labrum, [place]; Registered Nurse: Louis Matte, RN; Technician: Althea Charon, NT; Registered Nurse: Vaughan Basta, RN; Technician: Lyman Speller, NT   Subjective:  C/o left arm weakness last night.  CT scan head negative.   Now bilateral stiffness "I just feel weak and stiff," especially at shoulders Husband & daughter RN at bedside.  Daughter thinks that it is the pt's arthritis cervical spine acting up. PCA helps but not enough HTN yesterday No nausea/vomiting Wanting to get up  Objective:  Vital signs:  Filed Vitals:   05/21/13 0008 05/21/13 0400 05/21/13 0422 05/21/13 0759  BP: 143/87  152/60   Pulse: 83  93   Temp:   97.5 F (36.4 C)   TempSrc:   Oral   Resp: _0 Height:      Weight:      SpO2: 97% 97% 97% 97%    Last BM Date:  (colostomy)  Intake/Output   Yesterday:  02/27 0701 - 02/28 0700 In: 3610.3 [I.V.:3610.3] Out: 925 [Urine:875; Blood:50] This shift:     Bowel function:  Flatus: n  BM: n  Drain: n/a  Physical Exam:  General: Pt awake/alert/oriented x4 in no acute distress Eyes: PERRL, normal EOM.  Sclera  clear.  No icterus Neuro: CN II-XII intact w/o focal sensory/motor deficits.  Handgrip 4+/5 and equal  Lymph: No head/neck/groin lymphadenopathy Psych:  No delerium/psychosis/paranoia.  Chatty, somewhat tangental  HENT: Normocephalic, Mucus membranes moist.  No thrush.  No facial asymmetry.  No dysarthria.  Speaking normally Neck: Supple, No tracheal deviation Chest: No chest wall pain w good excursion CV:  Pulses intact.  Regular rhythm MS: Normal PROM>AROM mjr joints.  No obvious deformity.  No crepitus Abdomen: Soft.  Nondistended.  Mildly tender at incisions only.  No evidence of peritonitis.  No incarcerated hernias. Ext:  SCDs BLE.  No mjr edema.  No cyanosis Skin: No petechiae / purpura   Problem List:   Principal Problem:   Parastomal hernia with obstruction and without gangrene Active Problems:   Follicular low grade B-cell lymphoma   Atrial fibrillation   Hypokalemia   Nausea and vomiting   Assessment  [name]  80 y.o. female  1 Day Post-Op  Procedure(s): HERNIA REPAIR PARASTOMAL COLOSTOMY CLOSURE  Recovering gradually  Plan:  -improve pain control.  Change to fent only.  Increase home patch.  Add PO tylenol, ice/heat -clears.  Adv slowly given adv age & recent obstruction -control HTN  -no evid neuro defect/stroke - follow -VTE prophylaxis- SCDs, etc -mobilize as tolerated to help recovery.  PT/OT evals  I updated the patient's status to the patient & family.  Recommendations were made.  Questions were answered.  The patient & family expressed understanding & appreciation.  Adin Hector, M.D., F.A.C.S. Gastrointestinal and Minimally Invasive Surgery [facility] [address] [place], [place] [address] [phone] Main / Paging   05/21/2013   Results:   Labs: Results for orders placed during the hospital encounter of 05/13/13 (from the past 48 hour(s))  SURGICAL PCR SCREEN     Status: None    Collection Time    05/20/13  7:02 AM      Result Value Ref Range   MRSA, PCR NEGATIVE  NEGATIVE   Staphylococcus aureus NEGATIVE  NEGATIVE   Comment:            The Xpert SA Assay (FDA     approved for NASAL specimens     in patients over 50 years of age),     is one component of     a comprehensive surveillance     program.  Test performance has     been validated by [employer] for patients greater     than or equal to 68 year old.     It is not intended     to diagnose infection nor to     guide or monitor treatment.  CBC     Status: Abnormal   Collection Time    05/20/13  4:00 PM      Result Value Ref Range   WBC 12.9 (*) 4.0 - 10.5 K/uL   RBC 4.20  3.87 - 5.11 MIL/uL   Hemoglobin 12.9  12.0 - 15.0 g/dL   HCT 37.5  36.0 - 46.0 %   MCV 89.3  78.0 - 100.0 fL   MCH 30.7  26.0 - 34.0 pg   MCHC 34.4  30.0 - 36.0 g/dL   RDW 12.8  11.5 - 15.5 %   Platelets 252  150 - 400 K/uL  CREATININE, SERUM     Status: Abnormal   Collection Time    05/20/13  5:28 PM      Result Value Ref Range   Creatinine, Ser 0.60  0.50 - 1.10 mg/dL   GFR calc non Af Amer 84 (*) >90 mL/min   GFR calc Af Amer >90  >90 mL/min   Comment: (NOTE)     The eGFR has been calculated using the CKD EPI equation.     This calculation has not been validated in all clinical situations.     eGFR's persistently <90 mL/min signify possible Chronic Kidney     Disease.  CBC     Status: Abnormal   Collection Time    05/21/13  8:51 AM      Result Value Ref Range   WBC 13.9 (*) 4.0 - 10.5 K/uL   RBC 3.73 (*) 3.87 - 5.11 MIL/uL   Hemoglobin 11.7 (*) 12.0 - 15.0 g/dL   HCT 33.3 (*) 36.0 - 46.0 %   MCV 89.3  78.0 - 100.0 fL   MCH 31.4  26.0 - 34.0 pg   MCHC 35.1  30.0 - 36.0 g/dL   RDW 12.8  11.5 - 15.5 %   Platelets 243  150 - 400 K/uL  BASIC METABOLIC PANEL     Status: Abnormal   Collection Time    05/21/13  8:51 AM      Result Value Ref Range   Sodium 136 (*) 137 - 147 mEq/L   Potassium 4.6  3.7 - 5.3  mEq/L   Chloride 105  96 - 112 mEq/L   CO2 19  19 - 32 mEq/L   Glucose, Bld 136 (*) 70 - 99 mg/dL   BUN 5 (*) 6 - 23 mg/dL   Creatinine, Ser 0.67  0.50 - 1.10 mg/dL   Calcium 7.1 (*) 8.4 - 10.5 mg/dL   GFR calc non Af Amer 81 (*) >90 mL/min   GFR calc Af Amer >90  >90 mL/min   Comment: (NOTE)     The eGFR has been calculated using the CKD EPI equation.     This calculation has not been validated in all clinical situations.     eGFR's persistently <90 mL/min signify possible Chronic Kidney     Disease.    Imaging / Studies: Ct Head Wo Contrast  05/20/2013   CLINICAL DATA:  Left arm numbness and weakness.  EXAM: CT HEAD WITHOUT CONTRAST  TECHNIQUE: Contiguous axial images were obtained from the base of the skull through the vertex without intravenous contrast.  COMPARISON:  CT of the head performed 11/28/2011  FINDINGS: There is no evidence of acute infarction, mass lesion, or intra- or extra-axial hemorrhage on CT.  Mild periventricular white matter change likely reflects small vessel ischemic microangiopathy. Mild cerebellar atrophy is noted.  The brainstem and fourth ventricle are within normal limits. The basal ganglia are unremarkable in appearance. The cerebral hemispheres demonstrate grossly normal gray-white differentiation. No mass effect or midline shift is seen.  There is no evidence of fracture; visualized osseous structures are unremarkable in appearance. The orbits are within normal limits. The paranasal sinuses and mastoid air cells are well-aerated. No significant soft tissue abnormalities are seen.  IMPRESSION: 1. No acute intracranial pathology seen on CT. 2. Mild small vessel ischemic microangiopathy and mild cerebellar atrophy.   Electronically Signed   By: Garald Balding M.D.   On: 05/20/2013 23:41    Medications / Allergies: per chart  Antibiotics: Anti-infectives   Start     Dose/Rate Route Frequency Ordered Stop   05/20/13 1015  ciprofloxacin (CIPRO) IVPB 400 mg      400 mg 200 mL/hr over 60 Minutes Intravenous  Once 05/20/13 1008 05/20/13 1005   05/20/13 1015  metroNIDAZOLE (FLAGYL) IVPB 500 mg     500 mg 100 mL/hr over 60 Minutes Intravenous  Once 05/20/13 1008 05/20/13 1053   05/20/13 0600  metroNIDAZOLE (FLAGYL) IVPB 500 mg  Status:  Discontinued     500 mg 100 mL/hr over 60 Minutes Intravenous On call to O.R. 05/16/13 1215 05/18/13 1356   05/20/13 0600  ciprofloxacin (CIPRO) IVPB 400 mg  Status:  Discontinued     400 mg 200 mL/hr over 60 Minutes Intravenous On call to O.R. 05/16/13 1215 05/18/13 1356   05/19/13 1115  neomycin (MYCIFRADIN) tablet 1,000 mg     1,000 mg Oral  Once 05/19/13 1105 05/19/13 1332   05/19/13 1115  erythromycin (E-MYCIN) tablet 500 mg     500 mg Oral  Once 05/19/13 1105 05/19/13 1332       Note: This dictation was prepared with Dragon/digital dictation along with [employer]. Any transcriptional errors that result from this process are unintentional.

## 2013-05-21 NOTE — Progress Notes (Signed)
Earlier tonight, patient complained that she was not able to lift her arms. Grips on both sides were strong and patient was able to slightly lift her right arm but unable to hold it up. Patient was unable to lift or hold up her left arm at all. Patient could move both legs normally. Patient's smile symmetrical and no slurred speech. Patient's BP was also high at 179/71. Surgery MD on call contacted by charge RN. New orders received for head CT and to restart a few of patient's home meds. Patient transported to CT by charge RN and NT. New meds administered and BP down to 143/87. Will continue to monitor patient.

## 2013-05-22 LAB — BASIC METABOLIC PANEL
BUN: 4 mg/dL — ABNORMAL LOW (ref 6–23)
CALCIUM: 7.5 mg/dL — AB (ref 8.4–10.5)
CO2: 19 mEq/L (ref 19–32)
Chloride: 107 mEq/L (ref 96–112)
Creatinine, Ser: 0.64 mg/dL (ref 0.50–1.10)
GFR calc Af Amer: >90 mL/min (ref >90)
GFR calc non Af Amer: 83 mL/min — ABNORMAL LOW (ref >90)
Glucose, Bld: 117 mg/dL — ABNORMAL HIGH (ref 70–99)
POTASSIUM: 3.9 meq/L (ref 3.7–5.3)
SODIUM: 140 meq/L (ref 137–147)

## 2013-05-22 LAB — CBC
HCT: 32.2 % — ABNORMAL LOW (ref 36.0–46.0)
Hemoglobin: 11.2 g/dL — ABNORMAL LOW (ref 12.0–15.0)
MCH: 31.2 pg (ref 26.0–34.0)
MCHC: 34.8 g/dL (ref 30.0–36.0)
MCV: 89.7 fL (ref 78.0–100.0)
PLATELETS: 258 10*3/uL (ref 150–400)
RBC: 3.59 MIL/uL — ABNORMAL LOW (ref 3.87–5.11)
RDW: 13.2 % (ref 11.5–15.5)
WBC: 11 10*3/uL — AB (ref 4.0–10.5)

## 2013-05-22 LAB — GLUCOSE, CAPILLARY: GLUCOSE-CAPILLARY: 103 mg/dL — AB (ref 70–99)

## 2013-05-22 NOTE — Progress Notes (Signed)
Petal, MD, [address] [place], [place] [address] Phone: [phone] FAX: [phone]    [name] [id number] 1932/02/04  CARE TEAM:  PCP: Marco Collie, MD  Outpatient Care Team: Patient Care Team: Marco Collie as PCP - General (Family Medicine) Earnstine Regal, MD as Consulting Physician (General Surgery) Cleotis Nipper, MD as Consulting Physician (Gastroenterology) Deatra Robinson, MD as Consulting Physician (Medical Oncology)  Inpatient Treatment Team: Treatment Team: Attending Provider: Earnstine Regal, MD; Consulting Physician: Wonda Horner, MD; Technician: Audie Box, NT; Technician: Daleen Squibb, [place]; Registered Nurse: Gomez Cleverly, RN; Registered Nurse: Vaughan Basta, RN; Technician: Naomie Dean, NT; Registered Nurse: Atilano Median, RN; Registered Nurse: Carilyn Goodpasture, RN; Technician: Carlisle Beers, NT   Subjective:  C/o less especially at shoulders Husband at bedside.  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, medical movie PCA helps better but not using HTN yesterday Mild nausea w dec appetite Walking in hallways  Objective:  Vital signs:  Filed Vitals:   05/22/13 0638 05/22/13 0641 05/22/13 0731 05/22/13 1011  BP:  118/51  159/62  Pulse:  71  81  Temp:  98.6 F (37 C)    TempSrc:  Oral    Resp: 16 16 13 16   Height:      Weight:      SpO2: 96% 97% 97% 95%    Last BM Date:  (colostomy)  Intake/Output   Yesterday:  02/28 0701 - 03/01 0700 In: 1772.5 [I.V.:1772.5] Out: 3800 [Urine:3800] This shift:     Bowel function:  Flatus: n  BM: n  Drain: n/a  Physical Exam:  General: Pt awake/alert/oriented x4 in no acute distress Eyes: PERRL, normal EOM.  Sclera clear.  No icterus Neuro: CN II-XII intact w/o focal sensory/motor deficits.  Handgrip 4+/5 and equal  Lymph: No head/neck/groin lymphadenopathy Psych:  No  delerium/psychosis/paranoia.  Chatty, somewhat tangental  HENT: Normocephalic, Mucus membranes moist.  No thrush.  No facial asymmetry.  No dysarthria.  Speaking normally Neck: Supple, No tracheal deviation Chest: No chest wall pain w good excursion CV:  Pulses intact.  Regular rhythm MS: Normal PROM>AROM mjr joints.  No obvious deformity.  No crepitus.  Moving arms/wrists better Abdomen: Soft.  Nondistended.  Mildly tender at incision only.  No evidence of peritonitis.  No incarcerated hernias. Ext:  SCDs BLE.  No mjr edema.  No cyanosis Skin: No petechiae / purpura   Problem List:   Principal Problem:   Parastomal hernia with obstruction s/p ostomy takedown & primary repair 05/20/2013 Active Problems:   Follicular low grade B-cell lymphoma   Arthritis   Atrial fibrillation   Abdominal pain   Hypokalemia   Nausea and vomiting   Cervical pain (neck)   Pain in joint, shoulder region (R>L)   HTN (hypertension)   Assessment  [name]  80 y.o. female  2 Days Post-Op  Procedure(s): HERNIA REPAIR PARASTOMAL COLOSTOMY CLOSURE  Recovering gradually  Plan:  -pain control.  Fentanyl only.  Increased home patch to 50 mcg.  Add PO tylenol, ice/heat -clears.  Adv slowly given adv age & recent obstruction, maybe fulls tomorrow if better -control HTN  -no evid neuro defect/stroke - follow -VTE prophylaxis- SCDs, etc -mobilize as tolerated to help recovery.  PT/OT evals  I updated the patient's status to the RN, patient & husband.  Recommendations were made.  Questions were answered.  The patient &  family expressed understanding & appreciation.   Adin Hector, M.D., F.A.C.S. Gastrointestinal and Minimally Invasive Surgery [facility] [address], [place], [place] [address] [phone] Main / Paging   05/22/2013   Results:   Labs: Results for orders placed during the hospital encounter of 05/13/13 (from the past 48 hour(s))  CBC      Status: Abnormal   Collection Time    05/20/13  4:00 PM      Result Value Ref Range   WBC 12.9 (*) 4.0 - 10.5 K/uL   RBC 4.20  3.87 - 5.11 MIL/uL   Hemoglobin 12.9  12.0 - 15.0 g/dL   HCT 37.5  36.0 - 46.0 %   MCV 89.3  78.0 - 100.0 fL   MCH 30.7  26.0 - 34.0 pg   MCHC 34.4  30.0 - 36.0 g/dL   RDW 12.8  11.5 - 15.5 %   Platelets 252  150 - 400 K/uL  CREATININE, SERUM     Status: Abnormal   Collection Time    05/20/13  5:28 PM      Result Value Ref Range   Creatinine, Ser 0.60  0.50 - 1.10 mg/dL   GFR calc non Af Amer 84 (*) >90 mL/min   GFR calc Af Amer >90  >90 mL/min   Comment: (NOTE)     The eGFR has been calculated using the CKD EPI equation.     This calculation has not been validated in all clinical situations.     eGFR's persistently <90 mL/min signify possible Chronic Kidney     Disease.  CBC     Status: Abnormal   Collection Time    05/21/13  8:51 AM      Result Value Ref Range   WBC 13.9 (*) 4.0 - 10.5 K/uL   RBC 3.73 (*) 3.87 - 5.11 MIL/uL   Hemoglobin 11.7 (*) 12.0 - 15.0 g/dL   HCT 33.3 (*) 36.0 - 46.0 %   MCV 89.3  78.0 - 100.0 fL   MCH 31.4  26.0 - 34.0 pg   MCHC 35.1  30.0 - 36.0 g/dL   RDW 12.8  11.5 - 15.5 %   Platelets 243  150 - 400 K/uL  BASIC METABOLIC PANEL     Status: Abnormal   Collection Time    05/21/13  8:51 AM      Result Value Ref Range   Sodium 136 (*) 137 - 147 mEq/L   Potassium 4.6  3.7 - 5.3 mEq/L   Chloride 105  96 - 112 mEq/L   CO2 19  19 - 32 mEq/L   Glucose, Bld 136 (*) 70 - 99 mg/dL   BUN 5 (*) 6 - 23 mg/dL   Creatinine, Ser 0.67  0.50 - 1.10 mg/dL   Calcium 7.1 (*) 8.4 - 10.5 mg/dL   GFR calc non Af Amer 81 (*) >90 mL/min   GFR calc Af Amer >90  >90 mL/min   Comment: (NOTE)     The eGFR has been calculated using the CKD EPI equation.     This calculation has not been validated in all clinical situations.     eGFR's persistently <90 mL/min signify possible Chronic Kidney     Disease.  CBC     Status: Abnormal    Collection Time    05/22/13  4:10 AM      Result Value Ref Range   WBC 11.0 (*) 4.0 - 10.5 K/uL  RBC 3.59 (*) 3.87 - 5.11 MIL/uL   Hemoglobin 11.2 (*) 12.0 - 15.0 g/dL   HCT 32.2 (*) 36.0 - 46.0 %   MCV 89.7  78.0 - 100.0 fL   MCH 31.2  26.0 - 34.0 pg   MCHC 34.8  30.0 - 36.0 g/dL   RDW 13.2  11.5 - 15.5 %   Platelets 258  150 - 400 K/uL  BASIC METABOLIC PANEL     Status: Abnormal   Collection Time    05/22/13  4:10 AM      Result Value Ref Range   Sodium 140  137 - 147 mEq/L   Potassium 3.9  3.7 - 5.3 mEq/L   Chloride 107  96 - 112 mEq/L   CO2 19  19 - 32 mEq/L   Glucose, Bld 117 (*) 70 - 99 mg/dL   BUN 4 (*) 6 - 23 mg/dL   Creatinine, Ser 0.64  0.50 - 1.10 mg/dL   Calcium 7.5 (*) 8.4 - 10.5 mg/dL   GFR calc non Af Amer 83 (*) >90 mL/min   GFR calc Af Amer >90  >90 mL/min   Comment: (NOTE)     The eGFR has been calculated using the CKD EPI equation.     This calculation has not been validated in all clinical situations.     eGFR's persistently <90 mL/min signify possible Chronic Kidney     Disease.  GLUCOSE, CAPILLARY     Status: Abnormal   Collection Time    05/22/13  7:23 AM      Result Value Ref Range   Glucose-Capillary 103 (*) 70 - 99 mg/dL    Imaging / Studies: Ct Head Wo Contrast  05/20/2013   CLINICAL DATA:  Left arm numbness and weakness.  EXAM: CT HEAD WITHOUT CONTRAST  TECHNIQUE: Contiguous axial images were obtained from the base of the skull through the vertex without intravenous contrast.  COMPARISON:  CT of the head performed 11/28/2011  FINDINGS: There is no evidence of acute infarction, mass lesion, or intra- or extra-axial hemorrhage on CT.  Mild periventricular white matter change likely reflects small vessel ischemic microangiopathy. Mild cerebellar atrophy is noted.  The brainstem and fourth ventricle are within normal limits. The basal ganglia are unremarkable in appearance. The cerebral hemispheres demonstrate grossly normal gray-white differentiation.  No mass effect or midline shift is seen.  There is no evidence of fracture; visualized osseous structures are unremarkable in appearance. The orbits are within normal limits. The paranasal sinuses and mastoid air cells are well-aerated. No significant soft tissue abnormalities are seen.  IMPRESSION: 1. No acute intracranial pathology seen on CT. 2. Mild small vessel ischemic microangiopathy and mild cerebellar atrophy.   Electronically Signed   By: Garald Balding M.D.   On: 05/20/2013 23:41    Medications / Allergies: per chart  Antibiotics: Anti-infectives   Start     Dose/Rate Route Frequency Ordered Stop   05/20/13 1015  ciprofloxacin (CIPRO) IVPB 400 mg     400 mg 200 mL/hr over 60 Minutes Intravenous  Once 05/20/13 1008 05/20/13 1005   05/20/13 1015  metroNIDAZOLE (FLAGYL) IVPB 500 mg     500 mg 100 mL/hr over 60 Minutes Intravenous  Once 05/20/13 1008 05/20/13 1053   05/20/13 0600  metroNIDAZOLE (FLAGYL) IVPB 500 mg  Status:  Discontinued     500 mg 100 mL/hr over 60 Minutes Intravenous On call to O.R. 05/16/13 1215 05/18/13 1356   05/20/13 0600  ciprofloxacin (CIPRO) IVPB  400 mg  Status:  Discontinued     400 mg 200 mL/hr over 60 Minutes Intravenous On call to O.R. 05/16/13 1215 05/18/13 1356   05/19/13 1115  neomycin (MYCIFRADIN) tablet 1,000 mg     1,000 mg Oral  Once 05/19/13 1105 05/19/13 1332   05/19/13 1115  erythromycin (E-MYCIN) tablet 500 mg     500 mg Oral  Once 05/19/13 1105 05/19/13 1332       Note: This dictation was prepared with Dragon/digital dictation along with [employer]. Any transcriptional errors that result from this process are unintentional.

## 2013-05-23 ENCOUNTER — Encounter (HOSPITAL_COMMUNITY): Payer: Self-pay | Admitting: Surgery

## 2013-05-23 MED ORDER — ENSURE PUDDING PO PUDG
1.0000 | Freq: Three times a day (TID) | ORAL | Status: DC
  Administered 2013-05-23: 1 via ORAL
  Filled 2013-05-23 (×5): qty 1

## 2013-05-23 MED ORDER — METOPROLOL TARTRATE 50 MG PO TABS
50.0000 mg | ORAL_TABLET | Freq: Two times a day (BID) | ORAL | Status: DC
  Administered 2013-05-23 – 2013-05-25 (×5): 50 mg via ORAL
  Filled 2013-05-23 (×6): qty 1

## 2013-05-23 MED ORDER — ACETAMINOPHEN 325 MG PO TABS: 650.0000 mg | ORAL_TABLET | ORAL | Status: DC | PRN

## 2013-05-23 MED ORDER — METOPROLOL TARTRATE 1 MG/ML IV SOLN: 5.0000 mg | Freq: Four times a day (QID) | INTRAVENOUS | Status: DC | PRN

## 2013-05-23 MED ORDER — HYDROCODONE-ACETAMINOPHEN 5-325 MG PO TABS
1.0000 | ORAL_TABLET | ORAL | Status: DC | PRN
  Administered 2013-05-25: 1 via ORAL
  Filled 2013-05-23: qty 1

## 2013-05-23 NOTE — Progress Notes (Signed)
Patient ID: [name], female   DOB: [birth year], 80 y.o.   MRN: [id number]  General Surgery - [facility] - Progress Note  POD# 3  Subjective: Patient taking clear liquid breakfast.  Pain controlled.  No nausea.  Passing flatus.  Objective: Vital signs in last 24 hours: Temp:  [97.5 F (36.4 C)-98.1 F (36.7 C)] 97.9 F (36.6 C) (03/02 0636) Pulse Rate:  [73-84] 73 (03/02 0636) Resp:  [14-19] 14 (03/02 0731) BP: (108-159)/(48-65) 124/52 mmHg (03/02 0636) SpO2:  [95 %-100 %] 98 % (03/02 0731) Last BM Date:  (colostomy)  Intake/Output from previous day: 03/01 0701 - 03/02 0700 In: 3105 [P.O.:680; I.V.:1875] Out: 2050 [Urine:2050]  Exam: HEENT - clear, not icteric Neck - soft Chest - clear bilaterally Cor - rate controlled Abd - soft, mild distension; BS present; wounds clear and dry and intact Ext - no significant edema Neuro - grossly intact, no focal deficits  Lab Results:   Recent Labs  05/21/13 0851 05/22/13 0410  WBC 13.9* 11.0*  HGB 11.7* 11.2*  HCT 33.3* 32.2*  PLT 243 258     Recent Labs  05/21/13 0851 05/22/13 0410  NA 136* 140  K 4.6 3.9  CL 105 107  CO2 19 19  GLUCOSE 136* 117*  BUN 5* 4*  CREATININE 0.67 0.64  CALCIUM 7.1* 7.5*    Studies/Results: No results found.  Assessment / Plan: 1.  Status post colostomy closure and parastomal hernia repair  Advance to full liquids today  OOB, ambulate with physical therapy  Discontinue PCA - po hydrocodone and fentanyl patch  Monitor wounds and bowel function  Earnstine Regal, MD, [facility] Office: [phone]  05/23/2013

## 2013-05-23 NOTE — Progress Notes (Signed)
Pt tachy to 140s while ambulating in hall this AM. Asymptomatic and HR decreased to 90s after return to chair. Will give scheduled IV lopressor prior to next ambulation.

## 2013-05-23 NOTE — Progress Notes (Addendum)
3--03-2013 2300 Agree with assessment at beginning of shift.  No changes.

## 2013-05-23 NOTE — Progress Notes (Signed)
Pt normally at home is on 12.5 mcg Fentanyl patch.   New order for 10mcg Fentanyl patch placed 3-1.  Pt is on PCA fentanyl as well and on ET CO2 monitor as per protocol.  Pt noted to have shallow resp followed by bursts of deep resp while asleep(snuffling to awake self) and ETCO2 noted to drop and alarm.  PT easily aroused,  comfortable, much education provided and did not need to use PCA during the night and quickly able to increase RR and ETco2 with deep breathing.  Sats staying in high 90's at all times.  ???? Maybe be able  to decrease mcg of patch??  Pt walking AT LEAST 6 times qday - usually 2 full laps around floor, TDB&C, doing IS.  NO nausea noted from MN to 0600 and not complaining of pain and no outward S/S either.

## 2013-05-23 NOTE — Progress Notes (Signed)
Wasted Fentanyl 50cc with Warden Fillers, RN.

## 2013-05-23 NOTE — Progress Notes (Addendum)
INITIAL NUTRITION ASSESSMENT  DOCUMENTATION CODES Per approved criteria  -Non-severe (moderate) malnutrition in the context of chronic illness   INTERVENTION:  Ensure Pudding TID, each supplement provides 170 kcal and 4 grams protein  Once diet advances, recommend mechanical soft diet as patient reports swallowing difficulty  NUTRITION DIAGNOSIS: Inadequate oral intake related to swallowing difficulty as evidenced by reported intake.   Goal: Patient to meet >/= 90% of estimated needs  Monitor:  PO intake and supplement acceptance, I/Os, weight trends, labs  Reason for Assessment: Length of Stay  80 y.o. female  Admitting Dx: Parastomal hernia with obstruction and without gangrene  ASSESSMENT: Patient with a past medical history of Cancer (07/08/11); TIA (transient ischemic attack); Arthritis; Hypertension; Esophageal abnormality; Arthritis (09/04/2011); Dehydration (11/20/2011); Diarrhea (11/20/2011); Complication of anesthesia; PONV (postoperative nausea and vomiting); GERD (gastroesophageal reflux disease); Esophageal stricture; Septic shock(785.52) (11/22/2011); Candida esophagitis (01/06/2012); Diverticulitis of colon with perforation (11/21/2011); and Osteoporosis.   Pateint started to have severe abdominla pain and distension with vomiting earlier today, 2/21. Patient also endorses decreased colostomy output which was unusual for her. She has a complex hx of bowel perforation requiring emergent surgery followed by prolonged intensive care unit stay requiring tracheostomy and then prolonged rehabilitation in August 2013. 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 Followed by Dr. Humphrey Rolls. Patient presented to emergency department and had a CT scan done that showed Gastric herniation through the left colostomy site with severe gastric distention most likely from obstruction. This was discussed by ER physician per general surgery who recommends at this point admission hospitalist  service NG tube placement with surgery followup as a consult.  2/23:  EGD 2/25:  Colonoscopy 2/27:  Hernia repair parastomal; colostomy closure  Patient reports that she has "air bubbles" in her esophagus that makes it difficult for her to swallow her foods. Patient has been NPO or on clear liquids since admission on 2/20. Patient advanced to full liquids today, 3/2. Patient was eating lunch upon dietetic intern visit. Patient had consumed <10% of tray and was having trouble with self feeding. Patient described eating 2-3 meals per day PTA, however meals described were small and nutritionally inadequate. Patient refused Resource Breeze and Ensure Complete but was willing to try Ensure Pudding. -Pt reported difficulty swallowing foods, especially proteins. Required tougher meats (chicken, [place]) to be chopped. Recommend Mech soft diet texture modification as diet advancement tolerated. Pt reported having to be on pureed foods during a previous hospital admit; which significantly decreased her PO intake.  -Patient reported a 40 lb weight loss 2 years ago. Current weight has been slowly trending up since January 2014. Weight trend history was slightly difficulty to attain; husband and pt denied any significant recent weight loss. -Viewed surgical wound on abd. Would benefit from supplement to assist in meeting est nutrition needs for wound healing. -Has been started on Megace appetite stimulant on 2/27. Will continue to monitor for effectiveness.   Nutrition Focused Physical Exam:  Subcutaneous Fat:  Orbital Region: mild depletion Upper Arm Region: moderate depletion Thoracic and Lumbar Region: N/A  Muscle:  Temple Region: moderate depletion Clavicle Bone Region: moderate depletion Clavicle and Acromion Bone Region: moderate depletion Scapular Bone Region: N/A Dorsal Hand: moderate depletion Patellar Region: WNL Anterior Thigh Region: mild depletion Posterior Calf Region: mild  depletion  Edema: none noted  Patient meets criteria for non-severe (moderate) malnutrition in the context of chronic illness as evidenced by moderate body fat and muscle mass depletion.  Height: Ht Readings from Last 1 Encounters:  05/19/13 4\' 11"  (1.499 m)    Weight: Wt Readings from Last 1 Encounters:  05/19/13 101 lb 6.6 oz (46 kg)    Ideal Body Weight: 97.9 lb (44.5 kg)  % Ideal Body Weight: 106%  Wt Readings from Last 10 Encounters:  05/19/13 101 lb 6.6 oz (46 kg)  05/19/13 101 lb 6.6 oz (46 kg)  05/19/13 101 lb 6.6 oz (46 kg)  05/19/13 101 lb 6.6 oz (46 kg)  12/31/12 97 lb 12.8 oz (44.362 kg)  06/28/12 98 lb (44.453 kg)  06/28/12 96 lb 11.2 oz (43.863 kg)  04/17/12 91 lb 7.9 oz (41.5 kg)  03/30/12 93 lb 6.4 oz (42.366 kg)  12/06/11 129 lb 13.6 oz (58.9 kg)    Usual Body Weight: 130 lb (59.1 kg)  % Usual Body Weight: 78%  BMI:  Body mass index is 20.47 kg/(m^2).  Estimated Nutritional Needs: Kcal: 1400-1600 Protein: 70-80 grams  Fluid: >1.5 L  Skin: closed incisions on abdomen  Diet Order: Full Liquid  EDUCATION NEEDS: -No education needs identified at this time   Intake/Output Summary (Last 24 hours) at 05/23/13 1329 Last data filed at 05/23/13 0600  Gross per 24 hour  Intake   2555 ml  Output   2050 ml  Net    505 ml    Last BM: colostomy removed 2/27  Labs:   Recent Labs Lab 05/20/13 1728 05/21/13 0851 05/22/13 0410  NA  --  136* 140  K  --  4.6 3.9  CL  --  105 107  CO2  --  19 19  BUN  --  5* 4*  CREATININE 0.60 0.67 0.64  CALCIUM  --  7.1* 7.5*  GLUCOSE  --  136* 117*    CBG (last 3)   Recent Labs  05/22/13 0723  GLUCAP 103*    Scheduled Meds: . amLODipine  5 mg Oral Daily   And  . atorvastatin  40 mg Oral Daily  . enoxaparin (LOVENOX) injection  40 mg Subcutaneous Q24H  . excerise for heart and health book   Does not apply Once  . fentaNYL  50 mcg Transdermal Q72H  . irbesartan  75 mg Oral Daily  . lip balm   1 application Topical BID  . megestrol  400 mg Oral BID  . metoprolol tartrate  50 mg Oral BID  . saccharomyces boulardii  250 mg Oral BID  . sodium chloride  3 mL Intravenous Q12H    Continuous Infusions: . sodium chloride 20 mL/hr at 05/19/13 0931  . dextrose 5 % and 0.45 % NaCl with KCl 20 mEq/L 50 mL/hr at 05/23/13 6269    Past Medical History  Diagnosis Date  . Cancer 4/85/46    follicular B cell lymphoma  . TIA (transient ischemic attack)   . Arthritis   . Hypertension   . Esophageal abnormality     strictures  . Arthritis 09/04/2011  . Dehydration 11/20/2011  . Diarrhea 11/20/2011  . Complication of anesthesia   . PONV (postoperative nausea and vomiting)     in the 1980's  . GERD (gastroesophageal reflux disease)   . Esophageal stricture     x 2  . Septic shock(785.52) 11/22/2011  . Candida esophagitis 01/06/2012  . Diverticulitis of colon with perforation 11/21/2011  . Osteoporosis   . Dysrhythmia     Tachycardia  . HTN (hypertension)     Past Surgical History  Procedure Laterality Date  .  Appendectomy    . Parotidectomy w/ neck dissection total    . Tonsillectomy    . Abdominal hysterectomy      left ovaries  . Laparotomy  11/22/2011    Procedure: EXPLORATORY LAPAROTOMY;  Surgeon: Earnstine Regal, MD;  Location: [facility];  Service: General;  Laterality: N/A;  drainage of pelvic abscess  . Colostomy revision  11/22/2011    Procedure: COLON RESECTION SIGMOID;  Surgeon: Earnstine Regal, MD;  Location: [facility];  Service: General;  Laterality: N/A;  . Colostomy  11/22/2011    Procedure: COLOSTOMY;  Surgeon: Earnstine Regal, MD;  Location: [facility];  Service: General;  Laterality: N/A;  desending colostomy  . Esophagogastroduodenoscopy  01/06/2012    Procedure: ESOPHAGOGASTRODUODENOSCOPY (EGD);  Surgeon: Milus Banister, MD;  Location: [place] [facility];  Service: Endoscopy;  Laterality: N/A;  . Esophagogastroduodenoscopy N/A 05/16/2013    Procedure: ESOPHAGOGASTRODUODENOSCOPY (EGD);   Surgeon: Lear Ng, MD;  Location: [facility];  Service: Endoscopy;  Laterality: N/A;  . Colonoscopy N/A 05/18/2013    Procedure: COLONOSCOPY;  Surgeon: Lear Ng, MD;  Location: [facility];  Service: Endoscopy;  Laterality: N/A;  . Parastomal hernia repair N/A 05/20/2013    Procedure: HERNIA REPAIR PARASTOMAL;  Surgeon: Earnstine Regal, MD;  Location: [facility];  Service: General;  Laterality: N/A;  . Colostomy closure N/A 05/20/2013    Procedure: COLOSTOMY CLOSURE;  Surgeon: Earnstine Regal, MD;  Location: [facility];  Service: General;  Laterality: N/A;    Claudell Kyle, Dietetic Intern Pager: [phone]  I have reviewed and agree with initial assessment completed by Claudell Kyle, DI [facility] [place] Clinical Dietitian [id number]

## 2013-05-23 NOTE — Evaluation (Signed)
Occupational Therapy Evaluation Patient Details Name: [name] MRN: [id number] DOB: 02-Apr-1931 Today's Date: 05/23/2013 Time: 1400-1420 OT Time Calculation (min): 20 min  OT Assessment / Plan / Recommendation History of present illness Patient admitted with  severe abdominla pain and distension with vomiting earlier today. Patient also endorses decreased colostomy output which was unusual for her. UnderwentTakedown of colostomy; Exploratory laparotomy; and Repair of parastomal hernia.     Clinical Impression   Pt admitted with above. She demonstrates the below listed deficits and will benefit from continued OT to maximize safety and independence with BADLs.  Pt with new onset bil. Shoulder weakness - both shoulders with strength ~1+/5.  She also demonstrates decreased activity tolerance and fatigues with BADLs.  Pt reports she does not have assist at home as spouse has Parkinson's.  She prefers a home discharge, but discussed with her the need to be able to manage modified independently in a 24 hour environment, and acknowledges that she may not be able to do this. She plans to talk with her family to determine amount of assist available.  If they are unable to assist, she will need SNF.      OT Assessment  Patient needs continued OT Services    Follow Up Recommendations  SNF;Supervision/Assistance - 24 hour    Barriers to Discharge Decreased caregiver support    Equipment Recommendations  None recommended by OT    Recommendations for Other Services    Frequency  Min 2X/week    Precautions / Restrictions Precautions Precautions: Fall Restrictions Weight Bearing Restrictions: No   Pertinent Vitals/Pain     ADL  Eating/Feeding: Minimal assistance (per nursing) Where Assessed - Eating/Feeding: Edge of bed;Chair Grooming: Brushing hair;Moderate assistance;Teeth care Where Assessed - Grooming: Unsupported sitting Upper Body Bathing: Minimal assistance Where Assessed - Upper  Body Bathing: Unsupported sitting Lower Body Bathing: Maximal assistance Where Assessed - Lower Body Bathing: Unsupported sit to stand Upper Body Dressing: Moderate assistance Where Assessed - Upper Body Dressing: Unsupported sitting Lower Body Dressing: +1 Total assistance Where Assessed - Lower Body Dressing: Unsupported sit to stand Toilet Transfer: Min Psychiatric nurse Method: Sit to stand;Stand pivot Science writer: Comfort height toilet Toileting - Clothing Manipulation and Hygiene: Min guard Where Assessed - Toileting Clothing Manipulation and Hygiene: Sit to stand from 3-in-1 or toilet Transfers/Ambulation Related to ADLs: min guard assist ADL Comments: Pt unable to don/doff socks.  Fatigues very rapidly with activity.  Spoke with pt re: current level and need for 24 hour assist.  She reports that spouse cannot help her and is unsure how much family will be able to help her.  Instructed her to discuss with family as she may need to consider SNF level rehab prior to return home.     OT Diagnosis: Generalized weakness;Acute pain  OT Problem List: Decreased strength;Decreased activity tolerance;Impaired balance (sitting and/or standing);Decreased knowledge of use of DME or AE;Impaired UE functional use;Pain OT Treatment Interventions: Self-care/ADL training;Therapeutic exercise;DME and/or AE instruction;Therapeutic activities;Patient/family education   OT Goals(Current goals can be found in the care plan section) Acute Rehab OT Goals Patient Stated Goal: To get better OT Goal Formulation: With patient Time For Goal Achievement: 06/06/13 Potential to Achieve Goals: Good ADL Goals Pt Will Perform Eating: with modified independence;with adaptive utensils;sitting Pt Will Perform Grooming: with modified independence;with adaptive equipment;sitting;standing Pt Will Perform Upper Body Bathing: with modified independence;sitting Pt Will Perform Lower Body Bathing: with  modified independence;sit to/from stand;with adaptive equipment Pt Will Perform Upper Body  Dressing: with modified independence;with adaptive equipment;sitting Pt Will Perform Lower Body Dressing: with modified independence;with adaptive equipment;sit to/from stand Pt Will Transfer to Toilet: with modified independence;ambulating;regular height toilet;bedside commode;grab bars Pt Will Perform Toileting - Clothing Manipulation and hygiene: with modified independence;sit to/from stand;with adaptive equipment Pt/caregiver will Perform Home Exercise Program: Increased ROM;Increased strength;Right Upper extremity;Left upper extremity;With Supervision;With written HEP provided  Visit Information  Last OT Received On: 05/23/13 Assistance Needed: +1 History of Present Illness: Patient admitted with  severe abdominla pain and distension with vomiting earlier today. Patient also endorses decreased colostomy output which was unusual for her. UnderwentTakedown of colostomy; Exploratory laparotomy; and Repair of parastomal hernia       Prior Functioning     Home Living Family/patient expects to be discharged to:: Private residence Living Arrangements: Spouse/significant other Type of Home: House Home Access: Ramped entrance [place]: One level [place]: Walker - 2 wheels;Grab bars - toilet;Grab bars - tub/shower;Shower seat Prior Function Level of Independence: Independent with assistive device(s) Comments: spouse has parkinson's disease and is unable to assist pt  Communication Communication: No difficulties         Vision/Perception     Cognition  Cognition Arousal/Alertness: Awake/alert Behavior During Therapy: WFL for tasks assessed/performed Overall Cognitive Status: Within Functional Limits for tasks assessed (Pt with tangential speech)    Extremity/Trunk Assessment Upper Extremity Assessment Upper Extremity Assessment: RUE deficits/detail;LUE deficits/detail RUE  Deficits / Details: shoulder ~1+/5; elbow distally at least 4-/5 RUE Coordination: decreased gross motor LUE Deficits / Details: shoulder 1+/5; elbow distally at least 4-/5 LUE Coordination: decreased gross motor Lower Extremity Assessment Lower Extremity Assessment: Defer to PT evaluation Cervical / Trunk Assessment Cervical / Trunk Assessment: Kyphotic     Mobility Bed Mobility Overal bed mobility: Needs Assistance Bed Mobility: Supine to Sit;Sit to Supine Supine to sit: Supervision Sit to supine: Supervision Transfers Overall transfer level: Needs assistance Transfers: Sit to/from Stand;Stand Pivot Transfers Sit to Stand: Min guard Stand pivot transfers: Min guard     Exercise     Balance     End of Session OT - End of Session Activity Tolerance: Patient limited by fatigue Patient left: in bed;with call bell/phone within reach;with family/visitor present Nurse Communication: Mobility status  GO     Tabor Bartram M 05/23/2013, 3:39 PM

## 2013-05-23 NOTE — Progress Notes (Signed)
Pt HR tachy into 150s with short burst of afib while ambulating. Pt asymptomatic and HR came back down into 90-100s for remainder of ambulation. Dr Harlow Asa made aware and orders to restart pt's home dose of metoprolol received.

## 2013-05-23 NOTE — Progress Notes (Signed)
105ml Fentanyl PCA syringe wasted in sink. Witnessed by Watt Climes, RN.

## 2013-05-23 NOTE — Evaluation (Signed)
Physical Therapy Evaluation Patient Details Name: [name] MRN: [id number] DOB: [birth year] Today's Date: 05/23/2013 Time: 4696-2952 PT Time Calculation (min): 46 min  PT Assessment / Plan / Recommendation History of Present Illness  Patient admitted with  severe abdominla pain and distension with vomiting earlier today. . S/p Takedown of colostomy; Exploratory laparotomy; and Repair of parastomal hernia 05/20/13  Clinical Impression  On eval, pt was Min guard assist for mobility-able to ambulate ~300 feet while pushing IV pole. Demonstrates general weakness, decreased activity tolerance. However, pt also has bil UE weakness-L worse than R. Discussed d/c plan-pt states she does not have assist at home (pt actually assists husband who has Parkinson's). May need to consider ST rehab placement if strength does not improve.     PT Assessment  Patient needs continued PT services    Follow Up Recommendations  SNF;Home health PT;Supervision/Assistance - 24 hour (depending on progress-limited use of UEs presently affecting ability to perform ADLs/bed mobility)    Does the patient have the potential to tolerate intense rehabilitation      Barriers to Discharge        Equipment Recommendations  None recommended by PT    Recommendations for Other Services OT consult   Frequency Min 3X/week    Precautions / Restrictions Precautions Precautions: Fall Restrictions Weight Bearing Restrictions: No   Pertinent Vitals/Pain C/o minimal shoulder pain with AAROM exercises of UE-unrated      Mobility  Bed Mobility Overal bed mobility: Needs Assistance Bed Mobility: Rolling;Sidelying to Sit;Sit to Sidelying Rolling: Modified independent (Device/Increase time) Sidelying to sit: Supervision Supine to sit: Supervision Sit to supine: Supervision Sit to sidelying: Supervision General bed mobility comments: Increased time. Pt with difficulty reaching for bedrails when getting trunk to upright.   Transfers Overall transfer level: Needs assistance Transfers: Sit to/from Stand Sit to Stand: Min guard Stand pivot transfers: Min guard General transfer comment: close guard for safety Ambulation/Gait Ambulation/Gait assistance: Min guard Ambulation Distance (Feet): 300 Feet Assistive device:  (IV pole) Gait Pattern/deviations: Decreased stride length General Gait Details: slow gait speed. No LOB.     Exercises General Exercises - Upper Extremity Shoulder Flexion: AAROM;Both;5 reps;Supine Elbow Flexion: AROM;AAROM;Both;5 reps;Supine (AA-L UE, A-R UE)   PT Diagnosis: Difficulty walking;Generalized weakness  PT Problem List: Decreased strength;Decreased activity tolerance;Decreased mobility;Pain PT Treatment Interventions: DME instruction;Gait training;Functional mobility training;Therapeutic activities;Therapeutic exercise;Patient/family education     PT Goals(Current goals can be found in the care plan section) Acute Rehab PT Goals Patient Stated Goal: To get better PT Goal Formulation: With patient/family Time For Goal Achievement: 06/06/13 Potential to Achieve Goals: Good  Visit Information  Last PT Received On: 05/23/13 Assistance Needed: +1 History of Present Illness: Patient admitted with  severe abdominla pain and distension with vomiting earlier today. . S/p Takedown of colostomy; Exploratory laparotomy; and Repair of parastomal hernia 05/20/13       Prior [place] expects to be discharged to:: Private residence Living Arrangements: Spouse/significant other Type of Home: House Home Access: [place]: One [place]: Walker - 2 wheels;Grab bars - toilet;Grab bars - tub/shower;Shower seat Prior Function Level of Independence: Independent with assistive device(s) Comments: spouse has parkinson's disease and is unable to assist pt  Communication Communication: No difficulties    Cognition   Cognition Arousal/Alertness: Awake/alert Behavior During Therapy: WFL for tasks assessed/performed Overall Cognitive Status: Within Functional Limits for tasks assessed    Extremity/Trunk Assessment Upper Extremity Assessment Upper Extremity Assessment: Defer to  OT evaluation RUE Deficits / Details: shoulder ~1+/5; elbow distally at least 4-/5 RUE Coordination: decreased gross motor LUE Deficits / Details: shoulder 1+/5; elbow distally at least 4-/5 LUE Coordination: decreased gross motor Lower Extremity Assessment Lower Extremity Assessment: Generalized weakness Cervical / Trunk Assessment Cervical / Trunk Assessment: Kyphotic   Balance    End of Session PT - End of Session Activity Tolerance: Patient tolerated treatment well Patient left: in bed;with call bell/phone within reach;with family/visitor present  GP     Weston Anna, MPT Pager: [phone]

## 2013-05-24 ENCOUNTER — Telehealth: Payer: Self-pay | Admitting: Oncology

## 2013-05-24 DIAGNOSIS — E440 Moderate protein-calorie malnutrition: Secondary | ICD-10-CM | POA: Insufficient documentation

## 2013-05-24 MED ORDER — FENTANYL 25 MCG/HR TD PT72
25.0000 ug | MEDICATED_PATCH | TRANSDERMAL | Status: DC
  Administered 2013-05-24: 25 ug via TRANSDERMAL
  Filled 2013-05-24: qty 1

## 2013-05-24 NOTE — Progress Notes (Signed)
Patient ID: [name], female   DOB: Jun 15, 1931, 80 y.o.   MRN: [id number]  [facility] - Progress Note  POD# 4  Subjective: Patient sleeping comfortably, arouses easily.  No pain - did not take po pain Rx yesterday per nurse.  Tolerating full liquid diet - no nausea.  Flatus but no BM yet.  Voiding.  Ambulatory.  Objective: Vital signs in last 24 hours: Temp:  [97.6 F (36.4 C)-98.4 F (36.9 C)] 98.4 F (36.9 C) (03/03 0544) Pulse Rate:  [65-97] 65 (03/03 0544) Resp:  [16-18] 18 (03/03 0544) BP: (123-133)/(59-83) 123/59 mmHg (03/03 0544) SpO2:  [99 %-100 %] 99 % (03/03 0544) Last BM Date:  (colostomy)  Intake/Output from previous day: 03/02 0701 - 03/03 0700 In: 1595.8 [P.O.:240; I.V.:1355.8] Out: 1150 [Urine:1150]  Exam: HEENT - clear, not icteric Neck - soft Chest - clear bilaterally Cor - rate controlled Abd - soft; BS present; wounds clear and dry and intact - dressing off Ext - no significant edema Neuro - grossly intact, no focal deficits  Lab Results:   Recent Labs  05/21/13 0851 05/22/13 0410  WBC 13.9* 11.0*  HGB 11.7* 11.2*  HCT 33.3* 32.2*  PLT 243 258     Recent Labs  05/21/13 0851 05/22/13 0410  NA 136* 140  K 4.6 3.9  CL 105 107  CO2 19 19  GLUCOSE 136* 117*  BUN 5* 4*  CREATININE 0.67 0.64  CALCIUM 7.1* 7.5*    Studies/Results: No results found.  Assessment / Plan: 1.  Status post colostomy closure  Advance to soft diet today  Appreciate OT/PT eval and advice  Will request in-patient Rehab consult  Decrease fentanyl patch to 25 mcg  Earnstine Regal, MD, [facility] Office: [phone]  05/24/2013

## 2013-05-24 NOTE — Telephone Encounter (Signed)
, °

## 2013-05-24 NOTE — Progress Notes (Signed)
Patient has a bed at [facility] when stable for discharge - anticipating possible discharge tomorrow (Wed, 3/4).   Dr. Harlow Asa - please sign FL2 on front of shadow chart in [facility].   Clinical Social Work Department CLINICAL SOCIAL WORK PLACEMENT NOTE 05/24/2013  Patient:  [name], [name]  Account Number:  [id number] Admit date:  05/13/2013  Clinical Social Worker:  Renold Genta  Date/time:  05/24/2013 01:02 PM  Clinical Social Work is seeking post-discharge placement for this patient at the following level of care:   SKILLED NURSING   (*CSW will update this form in Epic as items are completed)   05/24/2013  Patient/family provided with [place] Department of Clinical Social Work's list of facilities offering this level of care within the geographic area requested by the patient (or if unable, by the patient's family).  05/24/2013  Patient/family informed of their freedom to choose among providers that offer the needed level of care, that participate in Medicare, Medicaid or managed care program needed by the patient, have an available bed and are willing to accept the patient.  05/24/2013  Patient/family informed of [facility]' ownership interest in [facility], as well as of the fact that they are under no obligation to receive care at this facility.  PASARR submitted to EDS on 05/24/2013 PASARR number received from EDS on 05/24/2013  FL2 transmitted to all facilities in geographic area requested by pt/family on  05/24/2013 FL2 transmitted to all facilities within larger geographic area on   Patient informed that his/her managed care company has contracts with or will negotiate with  certain facilities, including the following:     Patient/family informed of bed offers received:  05/24/2013 Patient chooses bed at [facility] Physician recommends and patient chooses bed at    Patient to be transferred to [facility] on   Patient to be transferred to facility by   The following physician request were entered in Epic:   Additional Comments:   Raynaldo Opitz, [place] Worker cell #: [phone]

## 2013-05-24 NOTE — Progress Notes (Signed)
Clinical Social Work Department BRIEF PSYCHOSOCIAL ASSESSMENT 05/24/2013  Patient:  [name], [name]     Account Number:  [id number]     Admit date:  05/13/2013  Clinical Social Worker:  Renold Genta  Date/Time:  05/24/2013 12:58 PM  Referred by:  Physician  Date Referred:  05/24/2013 Referred for  SNF Placement   Other Referral:   Interview type:  Patient Other interview type:   and husband at bedside    PSYCHOSOCIAL DATA Living Status:  HUSBAND Admitted from facility:   Level of care:   Primary support name:  Remedy Corporan, sr. (husband) ph#: [phone] Primary support relationship to patient:  SPOUSE Degree of support available:   good    CURRENT CONCERNS Current Concerns  Post-Acute Placement   Other Concerns:    SOCIAL WORK ASSESSMENT / PLAN CSW recieved consult that PT recommended SNF vs. home health + 24/7 supervision at discharge.   Assessment/plan status:  Information/Referral to [employer] Other assessment/ plan:   Information/referral to community resources:   CSW completed FL2 and faxed information out to [facility] - provided list of facilites to patient & husband.    PATIENT'S/FAMILY'S RESPONSE TO PLAN OF CARE: Patient informed CSW that she had been to [facility] about a year and a half ago for rehab and had a good experience there. CSW confirmed with Narda Rutherford @ [facility] that they would have a bed available for patient at discharge - anticipating possible discharge tomorrow.    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 Husband agrees with plan for rehab.       Raynaldo Opitz, [facility] Clinical Social Worker cell #: [phone]

## 2013-05-24 NOTE — Consult Note (Signed)
Physical Medicine and Rehabilitation Consult  Reason for Consult: Deconditioning due to gastric obstruction/recent surgery and BUE weakness Referring Physician:  Dr. Harlow Asa   HPI: [name] is a 80 y.o. female with history of esophageal dysmotility, A fib, and diverticular disease with prior surgery and parastomal hernia. She was admitted on 05/14/13 with abdominal pain, distension and N/V due to partial gastric herniation with obstruction. NGT placed and patient treated conservativelyde NPO. Dr Michail Sermon consulted for input and EGD done 05/16/13 revealing gastritis likely due to NG trauma, esophageal ulcer, duodenal diverticulum and no gastric outlet obstruction. Colonoscopy 04/28/13 revealed diverticulosis in descending colon otherwise normal  colonoscopy through ostomy.  Plavix held for 5 days and elected to undergo colostomy closure with parastomal hernia repair on 05/20/13 by Dr. Harlow Asa.  Post op with bilateral shoulder weakness with LUE numbness as well as tachycardia with activity. CT head negative for acute changes. She is tolerating clears and advanced to soft diet. PT/OT evaluation done yesterday. Patient is caregiver for husband with Parkinson's disease--MD recommending CIR due to deconditioning.    ROS  Past Medical History  Diagnosis Date  . Cancer [id number]    follicular B cell lymphoma  . TIA (transient ischemic attack)   . Arthritis   . Hypertension   . Esophageal abnormality     strictures  . Arthritis 09/04/2011  . Dehydration 11/20/2011  . Diarrhea 11/20/2011  . Complication of anesthesia   . PONV (postoperative nausea and vomiting)     in the 1980's  . GERD (gastroesophageal reflux disease)   . Esophageal stricture     x 2  . Septic shock(785.52) 11/22/2011  . Candida esophagitis 01/06/2012  . Diverticulitis of colon with perforation 11/21/2011  . Osteoporosis   . Dysrhythmia     Tachycardia  . HTN (hypertension)    Past Surgical History  Procedure  Laterality Date  . Appendectomy    . Parotidectomy w/ neck dissection total    . Tonsillectomy    . Abdominal hysterectomy      left ovaries  . Laparotomy  11/22/2011    Procedure: EXPLORATORY LAPAROTOMY;  Surgeon: Earnstine Regal, MD;  Location: [facility];  Service: General;  Laterality: N/A;  drainage of pelvic abscess  . Colostomy revision  11/22/2011    Procedure: COLON RESECTION SIGMOID;  Surgeon: Earnstine Regal, MD;  Location: [facility];  Service: General;  Laterality: N/A;  . Colostomy  11/22/2011    Procedure: COLOSTOMY;  Surgeon: Earnstine Regal, MD;  Location: [facility];  Service: General;  Laterality: N/A;  desending colostomy  . Esophagogastroduodenoscopy  01/06/2012    Procedure: ESOPHAGOGASTRODUODENOSCOPY (EGD);  Surgeon: Milus Banister, MD;  Location: [facility];  Service: Endoscopy;  Laterality: N/A;  . Esophagogastroduodenoscopy N/A 05/16/2013    Procedure: ESOPHAGOGASTRODUODENOSCOPY (EGD);  Surgeon: Lear Ng, MD;  Location: [facility];  Service: Endoscopy;  Laterality: N/A;  . Colonoscopy N/A 05/18/2013    Procedure: COLONOSCOPY;  Surgeon: Lear Ng, MD;  Location: [facility];  Service: Endoscopy;  Laterality: N/A;  . Parastomal hernia repair N/A 05/20/2013    Procedure: HERNIA REPAIR PARASTOMAL;  Surgeon: Earnstine Regal, MD;  Location: [facility];  Service: General;  Laterality: N/A;  . Colostomy closure N/A 05/20/2013    Procedure: COLOSTOMY CLOSURE;  Surgeon: Earnstine Regal, MD;  Location: [facility];  Service: General;  Laterality: N/A;   Family History  Problem Relation Age of Onset  . Cancer Mother 51  gastric  . Hypertension Father     heart disease  . Cancer Maternal Aunt 44    breast cancer  . Cancer Maternal Uncle 97    colon   Social History:  Married. Independent PTA. Per reports that she has never smoked. She has never used smokeless tobacco. Per reports that she does not drink alcohol or use illicit drugs.   Allergies  Allergen Reactions  .  Codeine Hives  . Other Nausea And Vomiting    anesthesia  . Penicillins Hives  . Sulfa Antibiotics Hives   Medications Prior to Admission  Medication Sig Dispense Refill  . amLODipine-atorvastatin (CADUET) 5-40 MG per tablet Take 1 tablet by mouth daily.       . clopidogrel (PLAVIX) 75 MG tablet Take 75 mg by mouth daily.      [name] ezetimibe (ZETIA) 10 MG tablet Take 10 mg by mouth daily.      . famotidine (PEPCID) 20 MG tablet Take 20 mg by mouth 2 (two) times daily.      . fentaNYL (DURAGESIC - DOSED MCG/HR) 12 MCG/HR Place 1 patch (12.5 mcg total) onto the skin every 3 (three) days.  10 patch  0  . megestrol (MEGACE) 40 MG/ML suspension Take 400 mg by mouth 2 (two) times daily.       . metoprolol (LOPRESSOR) 50 MG tablet Take 50 mg by mouth 2 (two) times daily.      . naproxen (NAPROSYN) 250 MG tablet Take 250 mg by mouth 2 (two) times daily as needed (pain).      . ondansetron (ZOFRAN) 4 MG tablet Take 1 tablet (4 mg total) by mouth every 6 (six) hours as needed for nausea.  45 tablet  0  . simethicone (MYLICON) 80 MG chewable tablet Chew 80 mg by mouth every 6 (six) hours as needed for flatulence.      . valsartan (DIOVAN) 160 MG tablet Take 160 mg by mouth daily.         Home: Home Living Family/patient expects to be discharged to:: Private residence Living Arrangements: Spouse/significant other Type of Home: House Home Access: [place]: One [place]: Walker - 2 wheels;Grab bars - toilet;Grab bars - tub/shower;Shower seat  Functional History: Prior Function Comments: spouse has parkinson's disease and is unable to assist pt  Functional Status:  Mobility:     Ambulation/Gait Ambulation Distance (Feet): 300 Feet General Gait Details: slow gait speed. No LOB.     ADL: ADL Eating/Feeding: Minimal assistance (per nursing) Where Assessed - Eating/Feeding: Edge of bed;Chair Grooming: Brushing hair;Moderate assistance;Teeth care Where Assessed -  Grooming: Unsupported sitting Upper Body Bathing: Minimal assistance Where Assessed - Upper Body Bathing: Unsupported sitting Lower Body Bathing: Maximal assistance Where Assessed - Lower Body Bathing: Unsupported sit to stand Upper Body Dressing: Moderate assistance Where Assessed - Upper Body Dressing: Unsupported sitting Lower Body Dressing: +1 Total assistance Where Assessed - Lower Body Dressing: Unsupported sit to stand Toilet Transfer: Min Psychiatric nurse Method: Sit to stand;Stand pivot Science writer: Comfort height toilet Transfers/Ambulation Related to ADLs: min guard assist ADL Comments: Pt unable to don/doff socks.  Fatigues very rapidly with activity.  Spoke with pt re: current level and need for 24 hour assist.  She reports that spouse cannot help her and is unsure how much family will be able to help her.  Instructed her to discuss with family as she may need to consider SNF level rehab prior to return home.  Cognition: Cognition Overall Cognitive Status: Within Functional Limits for tasks assessed Orientation Level: Oriented X4 Cognition Arousal/Alertness: Awake/alert Behavior During Therapy: WFL for tasks assessed/performed Overall Cognitive Status: Within Functional Limits for tasks assessed  Blood pressure 123/59, pulse 65, temperature 98.4 F (36.9 C), temperature source Oral, resp. rate 18, height 4\' 11"  (1.499 m), weight 46 kg (101 lb 6.6 oz), SpO2 99.00%. Physical Exam  No results found for this or any previous visit (from the past 24 hour(s)). No results found.  Assessment/Plan: Diagnosis:  Deconditioning   Comment:  Pt is min guard assist 35ft with PT. Has bed at SNF for tomorrow. This is appropriate rehab venue. Could not justify CIR admit.  Will defer formal consult. Thanks   Meredith Staggers, MD, [facility]     05/24/2013

## 2013-05-25 ENCOUNTER — Telehealth (INDEPENDENT_AMBULATORY_CARE_PROVIDER_SITE_OTHER): Payer: Self-pay

## 2013-05-25 DIAGNOSIS — I10 Essential (primary) hypertension: Secondary | ICD-10-CM | POA: Diagnosis not present

## 2013-05-25 DIAGNOSIS — R279 Unspecified lack of coordination: Secondary | ICD-10-CM | POA: Diagnosis not present

## 2013-05-25 DIAGNOSIS — M1990 Unspecified osteoarthritis, unspecified site: Secondary | ICD-10-CM | POA: Diagnosis not present

## 2013-05-25 DIAGNOSIS — Z48815 Encounter for surgical aftercare following surgery on the digestive system: Secondary | ICD-10-CM | POA: Diagnosis not present

## 2013-05-25 DIAGNOSIS — C8299 Follicular lymphoma, unspecified, extranodal and solid organ sites: Secondary | ICD-10-CM | POA: Diagnosis not present

## 2013-05-25 DIAGNOSIS — K4190 Unilateral femoral hernia, without obstruction or gangrene, not specified as recurrent: Secondary | ICD-10-CM | POA: Diagnosis not present

## 2013-05-25 DIAGNOSIS — M6281 Muscle weakness (generalized): Secondary | ICD-10-CM | POA: Diagnosis not present

## 2013-05-25 DIAGNOSIS — C801 Malignant (primary) neoplasm, unspecified: Secondary | ICD-10-CM | POA: Diagnosis not present

## 2013-05-25 DIAGNOSIS — I509 Heart failure, unspecified: Secondary | ICD-10-CM | POA: Diagnosis not present

## 2013-05-25 DIAGNOSIS — C8589 Other specified types of non-Hodgkin lymphoma, extranodal and solid organ sites: Secondary | ICD-10-CM | POA: Diagnosis not present

## 2013-05-25 DIAGNOSIS — R1314 Dysphagia, pharyngoesophageal phase: Secondary | ICD-10-CM | POA: Diagnosis not present

## 2013-05-25 DIAGNOSIS — R269 Unspecified abnormalities of gait and mobility: Secondary | ICD-10-CM | POA: Diagnosis not present

## 2013-05-25 DIAGNOSIS — IMO0002 Reserved for concepts with insufficient information to code with codable children: Secondary | ICD-10-CM | POA: Diagnosis not present

## 2013-05-25 DIAGNOSIS — E46 Unspecified protein-calorie malnutrition: Secondary | ICD-10-CM | POA: Diagnosis not present

## 2013-05-25 DIAGNOSIS — I4891 Unspecified atrial fibrillation: Secondary | ICD-10-CM | POA: Diagnosis not present

## 2013-05-25 MED ORDER — HYDROCODONE-ACETAMINOPHEN 5-325 MG PO TABS: 1.0000 | ORAL_TABLET | ORAL | Status: DC | PRN

## 2013-05-25 MED ORDER — HEPARIN SOD (PORK) LOCK FLUSH 100 UNIT/ML IV SOLN
500.0000 [IU] | INTRAVENOUS | Status: AC | PRN
  Administered 2013-05-25: 500 [IU]

## 2013-05-25 NOTE — Progress Notes (Signed)
Patient is set to discharge to [facility] today. Patient & husband at bedside aware. Discharge packet in [place], [place] aware. [facility] called for transport.   Clinical Social Work Department CLINICAL SOCIAL WORK PLACEMENT NOTE 05/25/2013  Patient:  [name], [name]  Account Number:  [id number] Admit date:  05/13/2013  Clinical Social Worker:  Renold Genta  Date/time:  05/24/2013 01:02 PM  Clinical Social Work is seeking post-discharge placement for this patient at the following level of care:   [place]   (*CSW will update this form in Epic as items are completed)   05/24/2013  Patient/family provided with [facility] Department of Clinical Social Work's list of facilities offering this level of care within the geographic area requested by the patient (or if unable, by the patient's family).  05/24/2013  Patient/family informed of their freedom to choose among providers that offer the needed level of care, that participate in Medicare, Medicaid or managed care program needed by the patient, have an available bed and are willing to accept the patient.  05/24/2013  Patient/family informed of [facility]' ownership interest in [facility], as well as of the fact that they are under no obligation to receive care at this facility.  PASARR submitted to EDS on 05/24/2013 PASARR number received from EDS on 05/24/2013  FL2 transmitted to all facilities in geographic area requested by pt/family on  05/24/2013 FL2 transmitted to all facilities within larger geographic area on   Patient informed that his/her managed care company has contracts with or will negotiate with  certain facilities, including the following:     Patient/family informed of bed offers received:  05/24/2013 Patient chooses bed at [facility] Physician recommends and patient chooses bed at    Patient to be transferred to [facility] on   05/25/2013 Patient to be transferred to facility by PTAR  The following physician request were entered in Epic:   Additional Comments:   Raynaldo Opitz, [place] Worker cell #: [phone]

## 2013-05-25 NOTE — Progress Notes (Signed)
Called [facility] two time to give report (called at 15:40 and 16:20) was place on for over 72minutes each time.

## 2013-05-25 NOTE — Progress Notes (Signed)
Occupational Therapy Treatment Patient Details Name: [name] MRN: [id number] DOB: 1931-05-19 Today's Date: 05/25/2013 Time: 8144-8185 OT Time Calculation (min): 13 min  OT Assessment / Plan / Recommendation  History of present illness Patient admitted with  severe abdominla pain and distension with vomiting earlier today. . S/p Takedown of colostomy; Exploratory laparotomy; and Repair of parastomal hernia 05/20/13   OT comments  Returned to room with HEP and pt needed to use bathroom.  Same assist as earlier today  Follow Up Recommendations  SNF    Barriers to Discharge       Equipment Recommendations  None recommended by OT    Recommendations for Other Services    Frequency Min 2X/week   Progress towards OT Goals Progress towards OT goals: Progressing toward goals  Plan Discharge plan needs to be updated    Precautions / Restrictions Precautions Precautions: Fall Restrictions Weight Bearing Restrictions: No   Pertinent Vitals/Pain No c/o pain this session    ADL   Toilet Transfer: Min guard Toilet Transfer Method: Sit to Loss adjuster, chartered: Bedside commode Toileting - Clothing Manipulation and Hygiene: Min guard Where Assessed - Toileting Clothing Manipulation and Hygiene: Sit to stand from 3-in-1 or toilet Transfers/Ambulation Related to ADLs: ambulated to bathroom with IV pole for support with R hand ADL Comments: provided long bendable spoon for pt to use for self-feeding.  She plans rehab later and will let OK work with her on using it.  Simulated only    OT Diagnosis:    OT Problem List:   OT Treatment Interventions:     OT Goals(current goals can now be found in the care plan section)    Visit Information  Last OT Received On: 05/25/13 Assistance Needed: +1 History of Present Illness: Patient admitted with  severe abdominla pain and distension with vomiting earlier today. . S/p Takedown of colostomy; Exploratory laparotomy; and Repair of  parastomal hernia 05/20/13    Subjective Data      Prior Functioning       Cognition  Cognition Arousal/Alertness: Awake/alert Behavior During Therapy: WFL for tasks assessed/performed Overall Cognitive Status: Within Functional Limits for tasks assessed    Mobility  Bed Mobility Rolling: Modified independent (Device/Increase time) Sidelying to sit: Min assist General bed mobility comments: daughter present at end of session; demonstrated how to help pt from R sidelying to sit Transfers Transfers: Sit to/from Stand Sit to Stand: Min guard General transfer comment: for safety    Exercises  Other Exercises Other Exercises: brought handouts for exercises performed earlier.  pt return demonstrated.      Balance    End of Session OT - End of Session Activity Tolerance: Patient tolerated treatment well Patient left: in bed;with call bell/phone within reach;with family/visitor present  [place] 05/25/2013, 1:39 PM Lesle Chris, OTR/L [phone] 05/25/2013

## 2013-05-25 NOTE — Progress Notes (Signed)
Occupational Therapy Treatment Patient Details Name: [name] MRN: [id number] DOB: 06/05/1931 Today's Date: 05/25/2013 Time: 9509-3267 OT Time Calculation (min): 40 min  OT Assessment / Plan / Recommendation  History of present illness Patient admitted with  severe abdominla pain and distension with vomiting earlier today. . S/p Takedown of colostomy; Exploratory laparotomy; and Repair of parastomal hernia 05/20/13   OT comments  Pt continues to have bil shoulder weakness and pain with movement.  Moving well  Follow Up Recommendations  SNF    Barriers to Discharge       Equipment Recommendations  None recommended by OT    Recommendations for Other Services    Frequency Min 2X/week   Progress towards OT Goals Progress towards OT goals: Progressing toward goals  Plan Discharge plan needs to be updated    Precautions / Restrictions Precautions Precautions: Fall Restrictions Weight Bearing Restrictions: No   Pertinent Vitals/Pain Sharp pain in shoulders at times with movements; modified activity    ADL  Grooming: Teeth care;Minimal assistance;Wash/dry hands (dental floss on stick/ occasional min A to rinse/manage cups) Where Assessed - Grooming: Supine, head of bed up;Supported standing (teeth supine; hands at sink) Toilet Transfer: Min Psychiatric nurse Method: Sit to Loss adjuster, chartered: Therapist, occupational and Hygiene: Min guard Where Assessed - Toileting Clothing Manipulation and Hygiene: Sit to stand from 3-in-1 or toilet Transfers/Ambulation Related to ADLs: ambulated to bathroom with IV pole for support with R hand ADL Comments: supported elbows on pillows to work on teeth with dental floss.  No c/o pain during functional activities    OT Diagnosis:    OT Problem List:   OT Treatment Interventions:     OT Goals(current goals can now be found in the care plan section)    Visit Information  Last OT Received On:  05/25/13 Assistance Needed: +1 History of Present Illness: Patient admitted with  severe abdominla pain and distension with vomiting earlier today. . S/p Takedown of colostomy; Exploratory laparotomy; and Repair of parastomal hernia 05/20/13    Subjective Data      Prior Functioning       Cognition  Cognition Arousal/Alertness: Awake/alert Behavior During Therapy: WFL for tasks assessed/performed Overall Cognitive Status: Within Functional Limits for tasks assessed    Mobility  Bed Mobility Rolling: Modified independent (Device/Increase time) Sidelying to sit: Min assist General bed mobility comments: cues for technique Transfers Transfers: Sit to/from Stand Sit to Stand: Min guard General transfer comment: for safety    Exercises  Other Exercises Other Exercises: educated on shoulder gravity eliminated IR/ER and scapula retraction Other Exercises: 2 sets 15 shoulder flexion AAROM.  Able to move distally actively.  Unable to tolerate horizontal abd or abduction on R   Balance    End of Session OT - End of Session Activity Tolerance: Patient tolerated treatment well Patient left: in bed;with call bell/phone within reach;with family/visitor present  [place] 05/25/2013, 10:43 AM Lesle Chris, OTR/L [phone] 05/25/2013

## 2013-05-25 NOTE — Telephone Encounter (Signed)
Pts husband advised of appt date.

## 2013-05-25 NOTE — Progress Notes (Signed)
Inpt. Rehab  Note that our rehab team is recommending SNF placement for this pt's rehab post acutely. Discussed with SW Raynaldo Opitz by phone.  Will sign off.  Please call if questions.    [place] Admissions Coordinator Cell [phone] Office [phone]

## 2013-05-25 NOTE — Discharge Summary (Signed)
Physician Discharge Summary [facility]  Patient ID: [name] MRN: [id number] DOB/AGE: October 24, 1931 80 y.o.  Admit date: 05/13/2013 Discharge date: 05/25/2013  Admission Diagnoses:  Parastomal hernia with obstruction  Discharge Diagnoses:  Principal Problem:   Parastomal hernia with obstruction s/p ostomy takedown & primary repair 05/20/2013 Active Problems:   Follicular low grade B-cell lymphoma   Arthritis   Atrial fibrillation   Abdominal pain   Hypokalemia   Nausea and vomiting   Cervical pain (neck)   Pain in joint, shoulder region (R>L)   HTN (hypertension)   Malnutrition of moderate degree   Discharged Condition: good  Hospital Course: patient admitted urgently from ER with proximal obstruction due to parastomal hernia.  Pre op preparation including colonoscopy and EGD exams.  To operating room for colostomy closure and repair of parastomal hernia.  Post op course uncomplicated.  Prepared for discharge to SNF for rehab today.  Consults: GI  Significant Diagnostic Studies: endoscopy and colonscopy  Treatments: surgery: colostomy takedown and repair of parastomal hernia  Discharge Exam: Blood pressure 134/54, pulse 69, temperature 98.5 F (36.9 C), temperature source Oral, resp. rate 18, height 4\' 11"  (1.499 m), weight 101 lb 6.6 oz (46 kg), SpO2 99.00%. HEENT - clear Neck - soft Chest - clear bilaterally Cor - RRR Abd - incisions clear and dry and intact with staples in place; BS active  Disposition: To SNF ([facility]) with family  Discharge Orders   Future Orders Complete By Expires   Diet general  As directed    Discharge instructions  As directed    Comments:     [facility]  OPEN ABDOMINAL SURGERY: POST OP INSTRUCTIONS  Always review your discharge instruction sheet given to you by the facility where your surgery was performed.  A prescription for pain medication may be given to you upon discharge.  Take your  pain medication as prescribed.  If narcotic pain medicine is not needed, then you may take acetaminophen (Tylenol) or ibuprofen (Advil) as needed. Take your usually prescribed medications unless otherwise directed. If you need a refill on your pain medication, please contact your pharmacy. They will contact our office to request authorization.  Prescriptions will not be filled after 5 pm or on weekends. You should follow a light diet the first few days after arrival home, such as soup and crackers, unless your doctor has advised otherwise. A high-fiber, low fat diet can be resumed as tolerated.  Be sure to include plenty of fluids daily.  Most patients will experience some swelling and bruising in the area of the incision. Ice packs will help. Swelling and bruising can take several days to resolve. It is common to experience some constipation if taking pain medication after surgery.  Increasing fluid intake and taking a stool softener will usually help or prevent this problem from occurring.  A mild laxative (Milk of Magnesia or Miralax) should be taken according to package directions if there are no bowel movements after 48 hours.  You may have steri-strips (small skin tapes) in place directly over the incision.  These strips should be left on the skin for 7-10 days.  If your surgeon used skin glue on the incision, you may shower in 24 hours.  The glue will flake off over the next 2-3 weeks.  Any sutures or staples will be removed at the office during your follow-up visit. You may find that a light gauze bandage over your incision may keep your staples from  being rubbed or pulled. You may shower and replace the bandage daily. ACTIVITIES:  You may resume regular (light) daily activities beginning the next day-such as daily self-care, walking, climbing stairs-gradually increasing activities as tolerated.  You may have sexual intercourse when it is comfortable.  Refrain from any heavy lifting or straining until  approved by your doctor.  You may drive when you no longer are taking prescription pain medication, you can comfortably wear a seatbelt, and you can safely maneuver your car and apply brakes. You should see your doctor in the office for a follow-up appointment approximately two weeks after your surgery.  Make sure that you call for this appointment within a day or two after you arrive home to insure a convenient appointment time.  WHEN TO CALL YOUR DOCTOR: Fever greater than 101.0 Inability to urinate Persistent nausea and/or vomiting Extreme swelling or bruising Continued bleeding from incision Increased pain, redness, or drainage from the incision Difficulty swallowing or breathing Muscle cramping or spasms Numbness or tingling in hands or around lips  IF YOU HAVE DISABILITY OR FAMILY LEAVE FORMS, YOU MUST BRING THEM TO THE OFFICE FOR PROCESSING.  PLEASE DO NOT GIVE THEM TO YOUR DOCTOR.  The clinic staff is available to answer your questions during regular business hours.  Please don't hesitate to call and ask to speak to one of the nurses if you have concerns.  [facility] [place] Office: [phone]  For further questions, please visit [email or web address]   Increase activity slowly  As directed    No dressing needed  As directed        Medication List         amLODipine-atorvastatin 5-40 MG per tablet  Commonly known as:  CADUET  Take 1 tablet by mouth daily.     clopidogrel 75 MG tablet  Commonly known as:  PLAVIX  Take 75 mg by mouth daily.     ezetimibe 10 MG tablet  Commonly known as:  ZETIA  Take 10 mg by mouth daily.     famotidine 20 MG tablet  Commonly known as:  PEPCID  Take 20 mg by mouth 2 (two) times daily.     fentaNYL 12 MCG/HR  Commonly known as:  DURAGESIC - dosed mcg/hr  Place 1 patch (12.5 mcg total) onto the skin every 3 (three) days.     HYDROcodone-acetaminophen 5-325 MG per tablet  Commonly known as:  NORCO/VICODIN   Take 1-2 tablets by mouth every 4 (four) hours as needed for moderate pain.     megestrol 40 MG/ML suspension  Commonly known as:  MEGACE  Take 400 mg by mouth 2 (two) times daily.     metoprolol 50 MG tablet  Commonly known as:  LOPRESSOR  Take 50 mg by mouth 2 (two) times daily.     naproxen 250 MG tablet  Commonly known as:  NAPROSYN  Take 250 mg by mouth 2 (two) times daily as needed (pain).     ondansetron 4 MG tablet  Commonly known as:  ZOFRAN  Take 1 tablet (4 mg total) by mouth every 6 (six) hours as needed for nausea.     simethicone 80 MG chewable tablet  Commonly known as:  MYLICON  Chew 80 mg by mouth every 6 (six) hours as needed for flatulence.     valsartan 160 MG tablet  Commonly known as:  DIOVAN  Take 160 mg by mouth daily.           Follow-up Information  Follow up with Earnstine Regal, MD. Schedule an appointment as soon as possible for a visit in 1 week. (For wound re-check and staple removal)    Specialty:  General Surgery   Contact information:   [address] [place] [address] [phone]       Earnstine Regal, MD, [facility] Office: [phone]   Signed: Earnstine Regal 05/25/2013, 11:15 AM

## 2013-05-27 DIAGNOSIS — I4891 Unspecified atrial fibrillation: Secondary | ICD-10-CM | POA: Diagnosis not present

## 2013-05-27 DIAGNOSIS — C8589 Other specified types of non-Hodgkin lymphoma, extranodal and solid organ sites: Secondary | ICD-10-CM | POA: Diagnosis not present

## 2013-05-27 DIAGNOSIS — I10 Essential (primary) hypertension: Secondary | ICD-10-CM | POA: Diagnosis not present

## 2013-05-27 DIAGNOSIS — E46 Unspecified protein-calorie malnutrition: Secondary | ICD-10-CM | POA: Diagnosis not present

## 2013-05-27 DIAGNOSIS — I509 Heart failure, unspecified: Secondary | ICD-10-CM | POA: Diagnosis not present

## 2013-05-31 ENCOUNTER — Ambulatory Visit (INDEPENDENT_AMBULATORY_CARE_PROVIDER_SITE_OTHER): Payer: Medicare Other | Admitting: Surgery

## 2013-05-31 ENCOUNTER — Encounter (INDEPENDENT_AMBULATORY_CARE_PROVIDER_SITE_OTHER): Payer: Self-pay | Admitting: Surgery

## 2013-05-31 VITALS — BP 118/68 | HR 78 | Temp 97.4°F | Resp 18 | Ht 59.0 in | Wt 90.2 lb

## 2013-05-31 DIAGNOSIS — K460 Unspecified abdominal hernia with obstruction, without gangrene: Secondary | ICD-10-CM

## 2013-05-31 DIAGNOSIS — K433 Parastomal hernia with obstruction, without gangrene: Secondary | ICD-10-CM

## 2013-05-31 NOTE — Progress Notes (Signed)
General Surgery [facility]  Chief Complaint  Patient presents with  . Routine Post Op    colostomy closure and repair parastomal hernia 05/20/2013    HISTORY: Patient is a 80 year old female who underwent colostomy closure with repair of parastomal hernia on 05/20/2013. She is currently in inpatient rehabilitation. She presents today accompanied by her husband for wound check.  EXAM: Midline abdominal incision and incision at the site of her previous colostomy are both healing nicely. Staples are removed and benzoin and Steri-Strips are applied. Abdomen is soft and nontender.  IMPRESSION: Status post colostomy closure and repair of parastomal hernia.  PLAN: Patient has had problems with constipation. I am going to ask them to begin MiraLAX 17 g daily. They may use due to lack suppositories as needed for constipation. Patient will return in 3 weeks for wound check.  Earnstine Regal, MD, [facility]   Visit Diagnoses: 1. Parastomal hernia with obstruction s/p ostomy takedown & primary repair 05/20/2013

## 2013-05-31 NOTE — Patient Instructions (Signed)
Miralax 17 gm orally daily.  Dulcolax supp per rectum as needed for constipation.  Earnstine Regal, MD, [facility] Office: [phone]

## 2013-06-16 DIAGNOSIS — E46 Unspecified protein-calorie malnutrition: Secondary | ICD-10-CM | POA: Diagnosis not present

## 2013-06-16 DIAGNOSIS — I4891 Unspecified atrial fibrillation: Secondary | ICD-10-CM | POA: Diagnosis not present

## 2013-06-16 DIAGNOSIS — I10 Essential (primary) hypertension: Secondary | ICD-10-CM | POA: Diagnosis not present

## 2013-06-16 DIAGNOSIS — I509 Heart failure, unspecified: Secondary | ICD-10-CM | POA: Diagnosis not present

## 2013-06-22 ENCOUNTER — Ambulatory Visit (INDEPENDENT_AMBULATORY_CARE_PROVIDER_SITE_OTHER): Payer: Medicare Other | Admitting: Surgery

## 2013-06-22 ENCOUNTER — Encounter (INDEPENDENT_AMBULATORY_CARE_PROVIDER_SITE_OTHER): Payer: Self-pay | Admitting: Surgery

## 2013-06-22 VITALS — BP 100/58 | HR 70 | Temp 97.7°F | Resp 18 | Ht 60.0 in | Wt 99.0 lb

## 2013-06-22 DIAGNOSIS — K460 Unspecified abdominal hernia with obstruction, without gangrene: Secondary | ICD-10-CM

## 2013-06-22 DIAGNOSIS — K433 Parastomal hernia with obstruction, without gangrene: Secondary | ICD-10-CM

## 2013-06-22 NOTE — Progress Notes (Signed)
General Surgery [facility]  Chief Complaint  Patient presents with  . Routine Post Op    takedown colostomy, repair parastomal hernia 05/20/2013    HISTORY: Patient is a 80 year old female who underwent take down of colostomy with repair of parastomal hernia in February 2015. Following discharge she has been doing the rehabilitation program at the skilled nursing facility. She is anticipating discharge home later this week. Patient returns today for a postoperative visit.  The patient is eating a regular diet. She is having between 3 and 5 formed bowel movements daily. She denies abdominal pain.  EXAM: Abdomen is soft without distention. Wounds are well-healed. No sign of infection. No sign of recurrent herniation.  IMPRESSION: Status post colostomy takedown and repair of parastomal hernia  PLAN: Patient will apply topical creams to her incisions. She is released to full activity. I've asked her to limit her lifting to 25 pounds.  Patient will return for surgical care as needed.  Earnstine Regal, MD, [facility]   Visit Diagnoses: 1. Parastomal hernia with obstruction s/p ostomy takedown & primary repair 05/20/2013

## 2013-06-22 NOTE — Patient Instructions (Signed)
  CARE OF INCISION   Apply cocoa butter/vitamin E cream (Palmer's brand) to your incision 2 - 3 times daily.  Massage cream into incision for one minute with each application.  Use sunscreen (50 SPF or higher) for first 6 months after surgery if area is exposed to sun.  You may alternate Mederma or other scar reducing cream with cocoa butter cream if desired.       Monic Engelmann M. Cordaro Mukai, MD, FACS      [facility]      Office: [phone]    

## 2013-06-27 DIAGNOSIS — Z48815 Encounter for surgical aftercare following surgery on the digestive system: Secondary | ICD-10-CM | POA: Diagnosis not present

## 2013-06-27 DIAGNOSIS — I4891 Unspecified atrial fibrillation: Secondary | ICD-10-CM | POA: Diagnosis not present

## 2013-06-27 DIAGNOSIS — M6281 Muscle weakness (generalized): Secondary | ICD-10-CM | POA: Diagnosis not present

## 2013-06-27 DIAGNOSIS — C8299 Follicular lymphoma, unspecified, extranodal and solid organ sites: Secondary | ICD-10-CM | POA: Diagnosis not present

## 2013-06-28 DIAGNOSIS — C8299 Follicular lymphoma, unspecified, extranodal and solid organ sites: Secondary | ICD-10-CM | POA: Diagnosis not present

## 2013-06-28 DIAGNOSIS — I4891 Unspecified atrial fibrillation: Secondary | ICD-10-CM | POA: Diagnosis not present

## 2013-06-28 DIAGNOSIS — Z48815 Encounter for surgical aftercare following surgery on the digestive system: Secondary | ICD-10-CM | POA: Diagnosis not present

## 2013-06-28 DIAGNOSIS — M6281 Muscle weakness (generalized): Secondary | ICD-10-CM | POA: Diagnosis not present

## 2013-06-30 DIAGNOSIS — Z48815 Encounter for surgical aftercare following surgery on the digestive system: Secondary | ICD-10-CM | POA: Diagnosis not present

## 2013-06-30 DIAGNOSIS — C8299 Follicular lymphoma, unspecified, extranodal and solid organ sites: Secondary | ICD-10-CM | POA: Diagnosis not present

## 2013-06-30 DIAGNOSIS — I4891 Unspecified atrial fibrillation: Secondary | ICD-10-CM | POA: Diagnosis not present

## 2013-06-30 DIAGNOSIS — M6281 Muscle weakness (generalized): Secondary | ICD-10-CM | POA: Diagnosis not present

## 2013-07-05 DIAGNOSIS — M6281 Muscle weakness (generalized): Secondary | ICD-10-CM | POA: Diagnosis not present

## 2013-07-05 DIAGNOSIS — I4891 Unspecified atrial fibrillation: Secondary | ICD-10-CM | POA: Diagnosis not present

## 2013-07-05 DIAGNOSIS — Z48815 Encounter for surgical aftercare following surgery on the digestive system: Secondary | ICD-10-CM | POA: Diagnosis not present

## 2013-07-05 DIAGNOSIS — C8299 Follicular lymphoma, unspecified, extranodal and solid organ sites: Secondary | ICD-10-CM | POA: Diagnosis not present

## 2013-07-07 ENCOUNTER — Other Ambulatory Visit: Payer: Medicare Other

## 2013-07-07 ENCOUNTER — Ambulatory Visit: Payer: Medicare Other | Admitting: Oncology

## 2013-07-07 DIAGNOSIS — I4891 Unspecified atrial fibrillation: Secondary | ICD-10-CM | POA: Diagnosis not present

## 2013-07-07 DIAGNOSIS — Z48815 Encounter for surgical aftercare following surgery on the digestive system: Secondary | ICD-10-CM | POA: Diagnosis not present

## 2013-07-07 DIAGNOSIS — C8299 Follicular lymphoma, unspecified, extranodal and solid organ sites: Secondary | ICD-10-CM | POA: Diagnosis not present

## 2013-07-07 DIAGNOSIS — M6281 Muscle weakness (generalized): Secondary | ICD-10-CM | POA: Diagnosis not present

## 2013-07-11 DIAGNOSIS — I4891 Unspecified atrial fibrillation: Secondary | ICD-10-CM | POA: Diagnosis not present

## 2013-07-11 DIAGNOSIS — M6281 Muscle weakness (generalized): Secondary | ICD-10-CM | POA: Diagnosis not present

## 2013-07-11 DIAGNOSIS — Z48815 Encounter for surgical aftercare following surgery on the digestive system: Secondary | ICD-10-CM | POA: Diagnosis not present

## 2013-07-11 DIAGNOSIS — C8299 Follicular lymphoma, unspecified, extranodal and solid organ sites: Secondary | ICD-10-CM | POA: Diagnosis not present

## 2013-07-12 ENCOUNTER — Other Ambulatory Visit: Payer: Self-pay

## 2013-07-12 DIAGNOSIS — Z1231 Encounter for screening mammogram for malignant neoplasm of breast: Secondary | ICD-10-CM

## 2013-07-13 DIAGNOSIS — I4891 Unspecified atrial fibrillation: Secondary | ICD-10-CM | POA: Diagnosis not present

## 2013-07-13 DIAGNOSIS — C8299 Follicular lymphoma, unspecified, extranodal and solid organ sites: Secondary | ICD-10-CM | POA: Diagnosis not present

## 2013-07-13 DIAGNOSIS — Z48815 Encounter for surgical aftercare following surgery on the digestive system: Secondary | ICD-10-CM | POA: Diagnosis not present

## 2013-07-13 DIAGNOSIS — M6281 Muscle weakness (generalized): Secondary | ICD-10-CM | POA: Diagnosis not present

## 2013-07-15 DIAGNOSIS — H35379 Puckering of macula, unspecified eye: Secondary | ICD-10-CM | POA: Diagnosis not present

## 2013-07-15 DIAGNOSIS — H18599 Other hereditary corneal dystrophies, unspecified eye: Secondary | ICD-10-CM | POA: Diagnosis not present

## 2013-07-15 DIAGNOSIS — Z961 Presence of intraocular lens: Secondary | ICD-10-CM | POA: Diagnosis not present

## 2013-07-15 DIAGNOSIS — H01009 Unspecified blepharitis unspecified eye, unspecified eyelid: Secondary | ICD-10-CM | POA: Diagnosis not present

## 2013-07-21 ENCOUNTER — Ambulatory Visit
Admission: RE | Admit: 2013-07-21 | Discharge: 2013-07-21 | Disposition: A | Discharge status: Home / Self Care | Payer: Medicare Other | Source: Ambulatory Visit

## 2013-07-21 DIAGNOSIS — Z1231 Encounter for screening mammogram for malignant neoplasm of breast: Secondary | ICD-10-CM

## 2013-07-22 DIAGNOSIS — Z48815 Encounter for surgical aftercare following surgery on the digestive system: Secondary | ICD-10-CM | POA: Diagnosis not present

## 2013-07-22 DIAGNOSIS — M6281 Muscle weakness (generalized): Secondary | ICD-10-CM | POA: Diagnosis not present

## 2013-07-22 DIAGNOSIS — C8299 Follicular lymphoma, unspecified, extranodal and solid organ sites: Secondary | ICD-10-CM | POA: Diagnosis not present

## 2013-07-22 DIAGNOSIS — I4891 Unspecified atrial fibrillation: Secondary | ICD-10-CM | POA: Diagnosis not present

## 2013-07-25 DIAGNOSIS — K921 Melena: Secondary | ICD-10-CM | POA: Diagnosis not present

## 2013-07-25 DIAGNOSIS — M79609 Pain in unspecified limb: Secondary | ICD-10-CM | POA: Diagnosis not present

## 2013-07-25 DIAGNOSIS — Z8719 Personal history of other diseases of the digestive system: Secondary | ICD-10-CM | POA: Diagnosis not present

## 2013-07-25 DIAGNOSIS — IMO0002 Reserved for concepts with insufficient information to code with codable children: Secondary | ICD-10-CM | POA: Diagnosis not present

## 2013-07-26 DIAGNOSIS — IMO0002 Reserved for concepts with insufficient information to code with codable children: Secondary | ICD-10-CM | POA: Diagnosis not present

## 2013-07-26 DIAGNOSIS — M810 Age-related osteoporosis without current pathological fracture: Secondary | ICD-10-CM | POA: Diagnosis not present

## 2013-08-01 ENCOUNTER — Telehealth: Payer: Self-pay

## 2013-08-01 DIAGNOSIS — C8280 Other types of follicular lymphoma, unspecified site: Secondary | ICD-10-CM

## 2013-08-01 DIAGNOSIS — R109 Unspecified abdominal pain: Secondary | ICD-10-CM

## 2013-08-01 MED ORDER — FENTANYL 12 MCG/HR TD PT72: 12.5000 ug | MEDICATED_PATCH | TRANSDERMAL | Status: DC

## 2013-08-01 NOTE — Telephone Encounter (Signed)
Yes please.  Thanks,  [place]

## 2013-08-01 NOTE — Telephone Encounter (Signed)
Pt requested refill of fentanyl patch and to schedule follow up appt.   Routed to [facility]

## 2013-08-02 ENCOUNTER — Other Ambulatory Visit: Payer: Self-pay | Admitting: *Deleted

## 2013-08-06 ENCOUNTER — Telehealth: Payer: Self-pay | Admitting: Oncology

## 2013-08-06 NOTE — Telephone Encounter (Signed)
s.w pt and advised on May appt.....pt ok and aware °

## 2013-08-18 ENCOUNTER — Ambulatory Visit (HOSPITAL_BASED_OUTPATIENT_CLINIC_OR_DEPARTMENT_OTHER): Payer: Medicare Other

## 2013-08-18 VITALS — BP 108/51 | HR 55 | Temp 97.5°F | Resp 16

## 2013-08-18 DIAGNOSIS — C8298 Follicular lymphoma, unspecified, lymph nodes of multiple sites: Secondary | ICD-10-CM | POA: Diagnosis not present

## 2013-08-18 DIAGNOSIS — Z95828 Presence of other vascular implants and grafts: Secondary | ICD-10-CM

## 2013-08-18 DIAGNOSIS — Z452 Encounter for adjustment and management of vascular access device: Secondary | ICD-10-CM | POA: Diagnosis not present

## 2013-08-18 MED ORDER — SODIUM CHLORIDE 0.9 % IJ SOLN
10.0000 mL | INTRAMUSCULAR | Status: DC | PRN
  Administered 2013-08-18: 10 mL via INTRAVENOUS
  Filled 2013-08-18: qty 10

## 2013-08-18 MED ORDER — HEPARIN SOD (PORK) LOCK FLUSH 100 UNIT/ML IV SOLN
500.0000 [IU] | Freq: Once | INTRAVENOUS | Status: AC
  Administered 2013-08-18: 500 [IU] via INTRAVENOUS
  Filled 2013-08-18: qty 5

## 2013-08-18 NOTE — Patient Instructions (Signed)

## 2013-08-26 ENCOUNTER — Other Ambulatory Visit: Payer: Self-pay

## 2013-08-26 DIAGNOSIS — R109 Unspecified abdominal pain: Secondary | ICD-10-CM

## 2013-08-26 DIAGNOSIS — C8280 Other types of follicular lymphoma, unspecified site: Secondary | ICD-10-CM

## 2013-08-26 MED ORDER — FENTANYL 12 MCG/HR TD PT72: 12.5000 ug | MEDICATED_PATCH | TRANSDERMAL | Status: DC

## 2013-08-26 NOTE — Telephone Encounter (Signed)
LMOVM - let pt know prescription for fentanyl would be ready for pickup today.  Also let pt know that we are aware she was hospitalized and had surgery earlier this year.  Pt to call clinic if she has any questions.

## 2013-08-31 DIAGNOSIS — I10 Essential (primary) hypertension: Secondary | ICD-10-CM | POA: Diagnosis not present

## 2013-08-31 DIAGNOSIS — E785 Hyperlipidemia, unspecified: Secondary | ICD-10-CM | POA: Diagnosis not present

## 2013-08-31 DIAGNOSIS — Z8673 Personal history of transient ischemic attack (TIA), and cerebral infarction without residual deficits: Secondary | ICD-10-CM | POA: Diagnosis not present

## 2013-09-26 ENCOUNTER — Other Ambulatory Visit: Payer: Self-pay

## 2013-09-26 DIAGNOSIS — C8280 Other types of follicular lymphoma, unspecified site: Secondary | ICD-10-CM

## 2013-09-26 MED ORDER — FENTANYL 12 MCG/HR TD PT72: 12.5000 ug | MEDICATED_PATCH | TRANSDERMAL | Status: DC

## 2013-09-26 NOTE — Telephone Encounter (Signed)
Let pt know refill for duragesic patches would be ready tomorrow.  Pt voiced udnerstanding.

## 2013-09-27 ENCOUNTER — Telehealth: Payer: Self-pay | Admitting: Hematology and Oncology

## 2013-09-27 NOTE — Telephone Encounter (Signed)
, °

## 2013-09-29 ENCOUNTER — Ambulatory Visit (HOSPITAL_BASED_OUTPATIENT_CLINIC_OR_DEPARTMENT_OTHER): Payer: Medicare Other

## 2013-09-29 VITALS — BP 128/50 | HR 62 | Temp 97.0°F

## 2013-09-29 DIAGNOSIS — Z95828 Presence of other vascular implants and grafts: Secondary | ICD-10-CM

## 2013-09-29 DIAGNOSIS — Z452 Encounter for adjustment and management of vascular access device: Secondary | ICD-10-CM

## 2013-09-29 DIAGNOSIS — C8298 Follicular lymphoma, unspecified, lymph nodes of multiple sites: Secondary | ICD-10-CM | POA: Diagnosis not present

## 2013-09-29 MED ORDER — SODIUM CHLORIDE 0.9 % IJ SOLN
10.0000 mL | INTRAMUSCULAR | Status: DC | PRN
  Administered 2013-09-29: 10 mL via INTRAVENOUS
  Filled 2013-09-29: qty 10

## 2013-09-29 MED ORDER — HEPARIN SOD (PORK) LOCK FLUSH 100 UNIT/ML IV SOLN
500.0000 [IU] | Freq: Once | INTRAVENOUS | Status: AC
  Administered 2013-09-29: 500 [IU] via INTRAVENOUS
  Filled 2013-09-29: qty 5

## 2013-09-29 NOTE — Patient Instructions (Signed)

## 2013-10-27 ENCOUNTER — Other Ambulatory Visit: Payer: Self-pay | Admitting: *Deleted

## 2013-10-27 DIAGNOSIS — C8280 Other types of follicular lymphoma, unspecified site: Secondary | ICD-10-CM

## 2013-10-27 MED ORDER — FENTANYL 12 MCG/HR TD PT72: 12.5000 ug | MEDICATED_PATCH | TRANSDERMAL | Status: DC

## 2013-10-27 NOTE — Telephone Encounter (Signed)
Called to request refill on her Duragesic patch. Will pick up tomorrow or Monday.

## 2013-11-08 DIAGNOSIS — R82998 Other abnormal findings in urine: Secondary | ICD-10-CM | POA: Diagnosis not present

## 2013-11-08 DIAGNOSIS — E785 Hyperlipidemia, unspecified: Secondary | ICD-10-CM | POA: Diagnosis not present

## 2013-11-08 DIAGNOSIS — I10 Essential (primary) hypertension: Secondary | ICD-10-CM | POA: Diagnosis not present

## 2013-11-08 DIAGNOSIS — M810 Age-related osteoporosis without current pathological fracture: Secondary | ICD-10-CM | POA: Diagnosis not present

## 2013-11-10 ENCOUNTER — Ambulatory Visit (HOSPITAL_BASED_OUTPATIENT_CLINIC_OR_DEPARTMENT_OTHER): Payer: Medicare Other

## 2013-11-10 VITALS — BP 110/42 | HR 60 | Temp 97.6°F

## 2013-11-10 DIAGNOSIS — Z452 Encounter for adjustment and management of vascular access device: Secondary | ICD-10-CM

## 2013-11-10 DIAGNOSIS — Z95828 Presence of other vascular implants and grafts: Secondary | ICD-10-CM

## 2013-11-10 DIAGNOSIS — C8298 Follicular lymphoma, unspecified, lymph nodes of multiple sites: Secondary | ICD-10-CM | POA: Diagnosis not present

## 2013-11-10 MED ORDER — HEPARIN SOD (PORK) LOCK FLUSH 100 UNIT/ML IV SOLN
500.0000 [IU] | Freq: Once | INTRAVENOUS | Status: AC
  Administered 2013-11-10: 500 [IU] via INTRAVENOUS
  Filled 2013-11-10: qty 5

## 2013-11-10 MED ORDER — SODIUM CHLORIDE 0.9 % IJ SOLN
10.0000 mL | INTRAMUSCULAR | Status: DC | PRN
  Administered 2013-11-10: 10 mL via INTRAVENOUS
  Filled 2013-11-10: qty 10

## 2013-11-10 NOTE — Patient Instructions (Signed)

## 2013-11-11 DIAGNOSIS — I10 Essential (primary) hypertension: Secondary | ICD-10-CM | POA: Diagnosis not present

## 2013-11-11 DIAGNOSIS — E559 Vitamin D deficiency, unspecified: Secondary | ICD-10-CM | POA: Diagnosis not present

## 2013-11-11 DIAGNOSIS — M810 Age-related osteoporosis without current pathological fracture: Secondary | ICD-10-CM | POA: Diagnosis not present

## 2013-11-11 DIAGNOSIS — K921 Melena: Secondary | ICD-10-CM | POA: Diagnosis not present

## 2013-11-11 DIAGNOSIS — E785 Hyperlipidemia, unspecified: Secondary | ICD-10-CM | POA: Diagnosis not present

## 2013-11-11 DIAGNOSIS — Z0000 Encounter for general adult medical examination without abnormal findings: Secondary | ICD-10-CM | POA: Diagnosis not present

## 2013-11-11 DIAGNOSIS — Z1331 Encounter for screening for depression: Secondary | ICD-10-CM | POA: Diagnosis not present

## 2013-11-11 DIAGNOSIS — Z23 Encounter for immunization: Secondary | ICD-10-CM | POA: Diagnosis not present

## 2013-11-14 DIAGNOSIS — Z1212 Encounter for screening for malignant neoplasm of rectum: Secondary | ICD-10-CM | POA: Diagnosis not present

## 2013-12-07 ENCOUNTER — Other Ambulatory Visit: Payer: Self-pay | Admitting: *Deleted

## 2013-12-07 DIAGNOSIS — C8280 Other types of follicular lymphoma, unspecified site: Secondary | ICD-10-CM

## 2013-12-07 MED ORDER — FENTANYL 12 MCG/HR TD PT72: 12.5000 ug | MEDICATED_PATCH | TRANSDERMAL | Status: DC

## 2013-12-14 DIAGNOSIS — M159 Polyosteoarthritis, unspecified: Secondary | ICD-10-CM | POA: Diagnosis not present

## 2013-12-14 DIAGNOSIS — M949 Disorder of cartilage, unspecified: Secondary | ICD-10-CM | POA: Diagnosis not present

## 2013-12-14 DIAGNOSIS — M4000 Postural kyphosis, site unspecified: Secondary | ICD-10-CM | POA: Diagnosis not present

## 2013-12-14 DIAGNOSIS — M5137 Other intervertebral disc degeneration, lumbosacral region: Secondary | ICD-10-CM | POA: Diagnosis not present

## 2013-12-14 DIAGNOSIS — Z87898 Personal history of other specified conditions: Secondary | ICD-10-CM | POA: Diagnosis not present

## 2013-12-14 DIAGNOSIS — M5030 Other cervical disc degeneration, unspecified cervical region: Secondary | ICD-10-CM | POA: Diagnosis not present

## 2013-12-14 DIAGNOSIS — M47814 Spondylosis without myelopathy or radiculopathy, thoracic region: Secondary | ICD-10-CM | POA: Diagnosis not present

## 2013-12-14 DIAGNOSIS — M899 Disorder of bone, unspecified: Secondary | ICD-10-CM | POA: Diagnosis not present

## 2013-12-14 DIAGNOSIS — IMO0002 Reserved for concepts with insufficient information to code with codable children: Secondary | ICD-10-CM | POA: Diagnosis not present

## 2013-12-14 DIAGNOSIS — M47817 Spondylosis without myelopathy or radiculopathy, lumbosacral region: Secondary | ICD-10-CM | POA: Diagnosis not present

## 2013-12-14 DIAGNOSIS — M5126 Other intervertebral disc displacement, lumbar region: Secondary | ICD-10-CM | POA: Diagnosis not present

## 2013-12-22 ENCOUNTER — Ambulatory Visit: Payer: Medicare Other

## 2013-12-22 ENCOUNTER — Encounter: Payer: Self-pay | Admitting: Hematology and Oncology

## 2013-12-22 ENCOUNTER — Ambulatory Visit (HOSPITAL_BASED_OUTPATIENT_CLINIC_OR_DEPARTMENT_OTHER): Payer: Medicare Other

## 2013-12-22 ENCOUNTER — Ambulatory Visit (HOSPITAL_BASED_OUTPATIENT_CLINIC_OR_DEPARTMENT_OTHER): Payer: Medicare Other | Admitting: Hematology and Oncology

## 2013-12-22 VITALS — BP 113/40 | HR 62 | Temp 97.6°F | Resp 18 | Ht 60.0 in | Wt 102.3 lb

## 2013-12-22 DIAGNOSIS — Z95828 Presence of other vascular implants and grafts: Secondary | ICD-10-CM

## 2013-12-22 DIAGNOSIS — M549 Dorsalgia, unspecified: Secondary | ICD-10-CM | POA: Diagnosis not present

## 2013-12-22 DIAGNOSIS — R109 Unspecified abdominal pain: Secondary | ICD-10-CM

## 2013-12-22 DIAGNOSIS — C8298 Follicular lymphoma, unspecified, lymph nodes of multiple sites: Secondary | ICD-10-CM | POA: Diagnosis not present

## 2013-12-22 DIAGNOSIS — C8280 Other types of follicular lymphoma, unspecified site: Secondary | ICD-10-CM

## 2013-12-22 LAB — CBC WITH DIFFERENTIAL/PLATELET
BASO%: 0.1 % (ref 0.0–2.0)
Basophils Absolute: 0 10*3/uL (ref 0.0–0.1)
EOS%: 2.7 % (ref 0.0–7.0)
Eosinophils Absolute: 0.1 10*3/uL (ref 0.0–0.5)
HCT: 39.6 % (ref 34.8–46.6)
HGB: 13.1 g/dL (ref 11.6–15.9)
LYMPH%: 26.4 % (ref 14.0–49.7)
MCH: 30.4 pg (ref 25.1–34.0)
MCHC: 33 g/dL (ref 31.5–36.0)
MCV: 91.9 fL (ref 79.5–101.0)
MONO#: 0.6 10*3/uL (ref 0.1–0.9)
MONO%: 11 % (ref 0.0–14.0)
NEUT#: 3.1 10*3/uL (ref 1.5–6.5)
NEUT%: 59.8 % (ref 38.4–76.8)
Platelets: 237 10*3/uL (ref 145–400)
RBC: 4.31 10*6/uL (ref 3.70–5.45)
RDW: 13.3 % (ref 11.2–14.5)
WBC: 5.1 10*3/uL (ref 3.9–10.3)
lymph#: 1.3 10*3/uL (ref 0.9–3.3)

## 2013-12-22 LAB — COMPREHENSIVE METABOLIC PANEL (CC13)
ALK PHOS: 36 U/L — AB (ref 40–150)
ALT: 10 U/L (ref 0–55)
AST: 17 U/L (ref 5–34)
Albumin: 4.3 g/dL (ref 3.5–5.0)
Anion Gap: 8 mEq/L (ref 3–11)
BUN: 28.1 mg/dL — ABNORMAL HIGH (ref 7.0–26.0)
CALCIUM: 9.7 mg/dL (ref 8.4–10.4)
CHLORIDE: 108 meq/L (ref 98–109)
CO2: 24 mEq/L (ref 22–29)
CREATININE: 1.2 mg/dL — AB (ref 0.6–1.1)
Glucose: 88 mg/dl (ref 70–140)
Potassium: 4.6 mEq/L (ref 3.5–5.1)
Sodium: 139 mEq/L (ref 136–145)
Total Bilirubin: 0.91 mg/dL (ref 0.20–1.20)
Total Protein: 6.7 g/dL (ref 6.4–8.3)

## 2013-12-22 LAB — LACTATE DEHYDROGENASE (CC13): LDH: 202 U/L (ref 125–245)

## 2013-12-22 MED ORDER — HEPARIN SOD (PORK) LOCK FLUSH 100 UNIT/ML IV SOLN
500.0000 [IU] | Freq: Once | INTRAVENOUS | Status: AC
  Administered 2013-12-22: 500 [IU] via INTRAVENOUS
  Filled 2013-12-22: qty 5

## 2013-12-22 MED ORDER — SODIUM CHLORIDE 0.9 % IJ SOLN
10.0000 mL | INTRAMUSCULAR | Status: DC | PRN
  Administered 2013-12-22: 10 mL via INTRAVENOUS
  Filled 2013-12-22: qty 10

## 2013-12-22 NOTE — Patient Instructions (Signed)

## 2013-12-22 NOTE — Assessment & Plan Note (Signed)
Follicular lymphoma: Treated with R. CVP in 2013 with a near complete response based on PET/CT. Today's clinical exam does not reveal any lymphadenopathy in the neck axillary or inguinal areas. I reviewed her previous blood work from March which was normal. I discussed with her that the surveillance for follicular lymphoma would be with periodic physical exams and blood work including [place]. There is no indication for doing routine CT scans.  Abdominal pain: With a history of previous colon resection with a colostomy after chemotherapy for the lymphoma. February 7824 complication with herniation of the stomach through the colostomy led to urgent surgery to repair the colostomy and reanastomose. Patient continues to have chronic mild abdominal pain. She does have decent bowel movements with stool softeners.

## 2013-12-22 NOTE — Progress Notes (Signed)
Patient Care Team: Marco Collie, MD as PCP - General (Family Medicine) Armandina Gemma, MD as Consulting Physician (General Surgery) Cleotis Nipper, MD as Consulting Physician (Gastroenterology) Deatra Robinson, MD as Consulting Physician (Medical Oncology)  DIAGNOSIS: Follicular lymphoma stage III  SUMMARY OF ONCOLOGIC HISTORY:   Follicular low grade B-cell lymphoma   07/22/2011 Initial Diagnosis Follicular low grade B-cell lymphoma   07/29/2011 PET scan Bilateral axillary, subpectoral and an, 5 mm K. node, right-sided pelvic sidewall and then 1.9 cm SUV 14.8, bilateral right greater than left inguinal adenopathy 2.4 x 3.3 right groin   09/05/2011 - 12/10/2011 Chemotherapy Rituxan CVP x4 cycles   11/11/2011 PET scan Near-complete response with only single small right inguinal lymph node with persistent low-grade SUV 3.2 activity   11/24/2011 Surgery Colon Segmental resection: Extensive suppurative acute serositis 1 benign lymph node    CHIEF COMPLIANT: Annual followup of lymphoma  INTERVAL HISTORY: [name] is a 80 year old Caucasian with above-mentioned history of follicular lymphoma that was diagnosed by Dr., treated with chemotherapy with Rituxan in June of 2013. She had a complete response to chemotherapy but subsequently underwent problems which required colon resection and colostomy placement. In February 2015 patient underwent another surgery emergently for herniation of the stomach colostomy. The colostomy was repaired and she was reanastomosed. She continues to have low back pain and intermittent abdominal pains.  REVIEW OF SYSTEMS:   Constitutional: Denies fevers, chills or abnormal weight loss; low back pain Eyes: Denies blurriness of vision Ears, nose, mouth, throat, and face: Denies mucositis or sore throat Respiratory: Denies cough, dyspnea or wheezes Cardiovascular: Denies palpitation, chest discomfort or lower extremity swelling Gastrointestinal:  Complains of abdominal  pain Skin: Denies abnormal skin rashes Lymphatics: Denies new lymphadenopathy or easy bruising Neurological:Denies numbness, tingling or new weaknesses Behavioral/Psych: Mood is stable, no new changes  Breast: denies any pain or lumps or nodules in either breasts All other systems were reviewed with the patient and are negative.  I have reviewed the past medical history, past surgical history, social history and family history with the patient and they are unchanged from previous note.  ALLERGIES:  is allergic to codeine; other; penicillins; and sulfa antibiotics.  MEDICATIONS:  Current Outpatient Prescriptions  Medication Sig Dispense Refill  . amLODipine-atorvastatin (CADUET) 5-40 MG per tablet Take 1 tablet by mouth daily.       . clopidogrel (PLAVIX) 75 MG tablet Take 75 mg by mouth daily.      [name] ezetimibe (ZETIA) 10 MG tablet Take 10 mg by mouth daily.      . famotidine (PEPCID) 20 MG tablet Take 20 mg by mouth 2 (two) times daily.      . fentaNYL (DURAGESIC - DOSED MCG/HR) 12 MCG/HR Place 1 patch (12.5 mcg total) onto the skin every 3 (three) days.  10 patch  0  . HYDROcodone-acetaminophen (NORCO/VICODIN) 5-325 MG per tablet Take 1-2 tablets by mouth every 4 (four) hours as needed for moderate pain.  30 tablet  0  . megestrol (MEGACE) 40 MG/ML suspension Take 400 mg by mouth 2 (two) times daily.       . metoprolol (LOPRESSOR) 50 MG tablet Take 50 mg by mouth 2 (two) times daily.      . polyethylene glycol (MIRALAX / GLYCOLAX) packet Take by mouth.      . valsartan (DIOVAN) 160 MG tablet Take 160 mg by mouth daily.       . naproxen (NAPROSYN) 250 MG tablet Take 250 mg by  mouth 2 (two) times daily as needed (pain).      . ondansetron (ZOFRAN) 4 MG tablet Take 1 tablet (4 mg total) by mouth every 6 (six) hours as needed for nausea.  45 tablet  0  . simethicone (MYLICON) 80 MG chewable tablet Chew 80 mg by mouth every 6 (six) hours as needed for flatulence.      . Vitamin D,  Ergocalciferol, (DRISDOL) 50000 UNITS CAPS capsule Take by mouth.       No current facility-administered medications for this visit.    PHYSICAL EXAMINATION: ECOG PERFORMANCE STATUS: 1 - Symptomatic but completely ambulatory  Filed Vitals:   12/22/13 1049  BP: 113/40  Pulse: 62  Temp: 97.6 F (36.4 C)  Resp: 18   Filed Weights   12/22/13 1049  Weight: 102 lb 4.8 oz (46.403 kg)    GENERAL:alert, no distress and comfortable SKIN: skin color, texture, turgor are normal, no rashes or significant lesions EYES: normal, Conjunctiva are pink and non-injected, sclera clear OROPHARYNX:no exudate, no erythema and lips, buccal mucosa, and tongue normal  NECK: supple, thyroid normal size, non-tender, without nodularity LYMPH:  no palpable lymphadenopathy in the cervical, axillary or inguinal LUNGS: clear to auscultation and percussion with normal breathing effort HEART: regular rate & rhythm and no murmurs and no lower extremity edema ABDOMEN:abdomen soft, non-tender and normal bowel sounds Musculoskeletal:no cyanosis of digits and no clubbing  NEURO: alert & oriented x 3 with fluent speech, no focal motor/sensory deficits  LABORATORY DATA:  I have reviewed the data as listed   Chemistry      Component Value Date/Time   NA 140 05/22/2013 0410   NA 140 12/31/2012 1326   K 3.9 05/22/2013 0410   K 4.5 12/31/2012 1326   CL 107 05/22/2013 0410   CL 106 06/28/2012 1131   CO2 19 05/22/2013 0410   CO2 23 12/31/2012 1326   BUN 4* 05/22/2013 0410   BUN 24.7 12/31/2012 1326   CREATININE 0.64 05/22/2013 0410   CREATININE 1.1 12/31/2012 1326      Component Value Date/Time   CALCIUM 7.5* 05/22/2013 0410   CALCIUM 10.0 12/31/2012 1326   ALKPHOS 41 05/14/2013 0515   ALKPHOS 88 12/31/2012 1326   AST 21 05/14/2013 0515   AST 18 12/31/2012 1326   ALT 17 05/14/2013 0515   ALT 13 12/31/2012 1326   BILITOT 0.5 05/14/2013 0515   BILITOT 0.86 12/31/2012 1326       Lab Results  Component Value Date   WBC  11.0* 05/22/2013   HGB 11.2* 05/22/2013   HCT 32.2* 05/22/2013   MCV 89.7 05/22/2013   PLT 258 05/22/2013   NEUTROABS 3.9 12/31/2012     RADIOGRAPHIC STUDIES: I have personally reviewed the radiology reports and agreed with their findings. No results found. CT head was reviewed which was showing small wasn't ischemic changes  ASSESSMENT & PLAN:  Follicular low grade B-cell lymphoma Follicular lymphoma: Treated with R. CVP in 2013 with a near complete response based on PET/CT. Today's clinical exam does not reveal any lymphadenopathy in the neck axillary or inguinal areas. I reviewed her previous blood work from March which was normal. I discussed with her that the surveillance for follicular lymphoma would be with periodic physical exams and blood work including [place]. There is no indication for doing routine CT scans.  Abdominal pain: With a history of previous colon resection with a colostomy after chemotherapy for the lymphoma. February 0630 complication with herniation of the stomach  through the colostomy led to urgent surgery to repair the colostomy and reanastomose. Patient continues to have chronic mild abdominal pain. She does have decent bowel movements with stool softeners.   Orders Placed This Encounter  Procedures  . CBC with Differential    Standing Status: Future     Number of Occurrences:      Standing Expiration Date: 12/22/2014  . Comprehensive metabolic panel (Cmet) - [facility]    Standing Status: Future     Number of Occurrences:      Standing Expiration Date: 12/22/2014  . Lactate dehydrogenase (LDH) - [facility]    Standing Status: Future     Number of Occurrences:      Standing Expiration Date: 12/22/2014   The patient has a good understanding of the overall plan. she agrees with it. She will call with any problems that may develop before her next visit here.  I spent 15 minutes counseling the patient face to face. The total time spent in the appointment was 20 minutes and more than  50% was on counseling and review of test results    Rulon Eisenmenger, MD 12/22/2013 11:35 AM

## 2013-12-23 ENCOUNTER — Telehealth: Payer: Self-pay | Admitting: Hematology and Oncology

## 2013-12-23 NOTE — Telephone Encounter (Signed)
, °

## 2014-01-02 DIAGNOSIS — Z23 Encounter for immunization: Secondary | ICD-10-CM | POA: Diagnosis not present

## 2014-01-09 ENCOUNTER — Other Ambulatory Visit: Payer: Self-pay

## 2014-01-09 DIAGNOSIS — C8280 Other types of follicular lymphoma, unspecified site: Secondary | ICD-10-CM

## 2014-01-09 MED ORDER — FENTANYL 12 MCG/HR TD PT72: 12.5000 ug | MEDICATED_PATCH | TRANSDERMAL | Status: DC

## 2014-01-09 NOTE — Telephone Encounter (Signed)
LMOVM - prescription will be ready for pick up after lunch tomorrow.  Pt to call clinic if she has any other questions.

## 2014-01-13 DIAGNOSIS — H35371 Puckering of macula, right eye: Secondary | ICD-10-CM | POA: Diagnosis not present

## 2014-01-13 DIAGNOSIS — H04123 Dry eye syndrome of bilateral lacrimal glands: Secondary | ICD-10-CM | POA: Diagnosis not present

## 2014-01-13 DIAGNOSIS — H01001 Unspecified blepharitis right upper eyelid: Secondary | ICD-10-CM | POA: Diagnosis not present

## 2014-01-13 DIAGNOSIS — H01004 Unspecified blepharitis left upper eyelid: Secondary | ICD-10-CM | POA: Diagnosis not present

## 2014-02-01 DIAGNOSIS — M810 Age-related osteoporosis without current pathological fracture: Secondary | ICD-10-CM | POA: Diagnosis not present

## 2014-02-01 DIAGNOSIS — Z6820 Body mass index (BMI) 20.0-20.9, adult: Secondary | ICD-10-CM | POA: Diagnosis not present

## 2014-02-02 ENCOUNTER — Ambulatory Visit (HOSPITAL_BASED_OUTPATIENT_CLINIC_OR_DEPARTMENT_OTHER): Payer: Medicare Other

## 2014-02-02 VITALS — BP 103/48 | HR 60 | Temp 97.6°F

## 2014-02-02 DIAGNOSIS — Z452 Encounter for adjustment and management of vascular access device: Secondary | ICD-10-CM | POA: Diagnosis not present

## 2014-02-02 DIAGNOSIS — Z95828 Presence of other vascular implants and grafts: Secondary | ICD-10-CM

## 2014-02-02 DIAGNOSIS — C8298 Follicular lymphoma, unspecified, lymph nodes of multiple sites: Secondary | ICD-10-CM

## 2014-02-02 MED ORDER — HEPARIN SOD (PORK) LOCK FLUSH 100 UNIT/ML IV SOLN
500.0000 [IU] | Freq: Once | INTRAVENOUS | Status: AC
  Administered 2014-02-02: 500 [IU] via INTRAVENOUS
  Filled 2014-02-02: qty 5

## 2014-02-02 MED ORDER — SODIUM CHLORIDE 0.9 % IJ SOLN
10.0000 mL | INTRAMUSCULAR | Status: DC | PRN
  Administered 2014-02-02: 10 mL via INTRAVENOUS
  Filled 2014-02-02: qty 10

## 2014-02-02 NOTE — Patient Instructions (Signed)

## 2014-02-14 ENCOUNTER — Other Ambulatory Visit: Payer: Self-pay

## 2014-02-14 DIAGNOSIS — C8280 Other types of follicular lymphoma, unspecified site: Secondary | ICD-10-CM

## 2014-02-14 MED ORDER — FENTANYL 12 MCG/HR TD PT72: 12.5000 ug | MEDICATED_PATCH | TRANSDERMAL | Status: DC

## 2014-02-14 NOTE — Telephone Encounter (Signed)
Prescription placed in book.  Let pt know scrip will be avail for pickup tomorrow.  Pt voiced understanding.

## 2014-02-17 ENCOUNTER — Telehealth: Payer: Self-pay | Admitting: Hematology and Oncology

## 2014-02-17 NOTE — Telephone Encounter (Signed)
due to early closing moved 12/24 appt to earlier time. lmonvm for pt and mailed new schedule.

## 2014-03-16 ENCOUNTER — Ambulatory Visit (HOSPITAL_BASED_OUTPATIENT_CLINIC_OR_DEPARTMENT_OTHER): Payer: Medicare Other

## 2014-03-16 VITALS — BP 131/46 | HR 66 | Temp 98.0°F

## 2014-03-16 DIAGNOSIS — Z452 Encounter for adjustment and management of vascular access device: Secondary | ICD-10-CM

## 2014-03-16 DIAGNOSIS — Z95828 Presence of other vascular implants and grafts: Secondary | ICD-10-CM

## 2014-03-16 DIAGNOSIS — C8298 Follicular lymphoma, unspecified, lymph nodes of multiple sites: Secondary | ICD-10-CM | POA: Diagnosis not present

## 2014-03-16 MED ORDER — HEPARIN SOD (PORK) LOCK FLUSH 100 UNIT/ML IV SOLN
500.0000 [IU] | Freq: Once | INTRAVENOUS | Status: AC
  Administered 2014-03-16: 500 [IU] via INTRAVENOUS
  Filled 2014-03-16: qty 5

## 2014-03-16 MED ORDER — SODIUM CHLORIDE 0.9 % IJ SOLN
10.0000 mL | INTRAMUSCULAR | Status: DC | PRN
  Administered 2014-03-16: 10 mL via INTRAVENOUS
  Filled 2014-03-16: qty 10

## 2014-03-16 NOTE — Patient Instructions (Signed)

## 2014-03-30 ENCOUNTER — Other Ambulatory Visit: Payer: Self-pay

## 2014-03-30 DIAGNOSIS — C8280 Other types of follicular lymphoma, unspecified site: Secondary | ICD-10-CM

## 2014-03-30 MED ORDER — FENTANYL 12 MCG/HR TD PT72: 12.5000 ug | MEDICATED_PATCH | TRANSDERMAL | Status: DC

## 2014-03-30 NOTE — Telephone Encounter (Signed)
Let pt know refill would be in book before end of business today.  Pt can pick up tomorrow before 4 pm.  Pt voiced understanding.

## 2014-04-03 DIAGNOSIS — H04123 Dry eye syndrome of bilateral lacrimal glands: Secondary | ICD-10-CM | POA: Diagnosis not present

## 2014-04-03 DIAGNOSIS — H01004 Unspecified blepharitis left upper eyelid: Secondary | ICD-10-CM | POA: Diagnosis not present

## 2014-04-03 DIAGNOSIS — H01001 Unspecified blepharitis right upper eyelid: Secondary | ICD-10-CM | POA: Diagnosis not present

## 2014-04-11 ENCOUNTER — Telehealth: Payer: Self-pay | Admitting: Hematology and Oncology

## 2014-04-11 NOTE — Telephone Encounter (Signed)
, °

## 2014-04-24 ENCOUNTER — Telehealth: Payer: Self-pay | Admitting: Hematology and Oncology

## 2014-04-24 NOTE — Telephone Encounter (Signed)
, °

## 2014-04-28 ENCOUNTER — Ambulatory Visit (HOSPITAL_BASED_OUTPATIENT_CLINIC_OR_DEPARTMENT_OTHER): Payer: Medicare Other

## 2014-04-28 VITALS — BP 132/49 | HR 65 | Temp 97.5°F

## 2014-04-28 DIAGNOSIS — Z452 Encounter for adjustment and management of vascular access device: Secondary | ICD-10-CM

## 2014-04-28 DIAGNOSIS — R232 Flushing: Secondary | ICD-10-CM

## 2014-04-28 DIAGNOSIS — C8298 Follicular lymphoma, unspecified, lymph nodes of multiple sites: Secondary | ICD-10-CM

## 2014-04-28 MED ORDER — SODIUM CHLORIDE 0.9 % IJ SOLN
10.0000 mL | INTRAMUSCULAR | Status: DC | PRN
  Administered 2014-04-28: 10 mL via INTRAVENOUS
  Filled 2014-04-28: qty 10

## 2014-04-28 MED ORDER — HEPARIN SOD (PORK) LOCK FLUSH 100 UNIT/ML IV SOLN
500.0000 [IU] | Freq: Once | INTRAVENOUS | Status: AC
  Administered 2014-04-28: 500 [IU] via INTRAVENOUS
  Filled 2014-04-28: qty 5

## 2014-04-28 NOTE — Patient Instructions (Signed)

## 2014-05-05 ENCOUNTER — Telehealth: Payer: Self-pay | Admitting: *Deleted

## 2014-05-05 ENCOUNTER — Other Ambulatory Visit: Payer: Self-pay | Admitting: Emergency Medicine

## 2014-05-05 DIAGNOSIS — C8280 Other types of follicular lymphoma, unspecified site: Secondary | ICD-10-CM

## 2014-05-05 MED ORDER — FENTANYL 12 MCG/HR TD PT72: 12.5000 ug | MEDICATED_PATCH | TRANSDERMAL | Status: DC

## 2014-05-05 NOTE — Telephone Encounter (Signed)
PT. WOULD LIKE TO PICK UP TODAY. PLEASE CALL PT. WHEN PRESCRIPTION IS READY. SECOND PHONE [phone]

## 2014-06-06 ENCOUNTER — Telehealth: Payer: Self-pay | Admitting: *Deleted

## 2014-06-06 ENCOUNTER — Other Ambulatory Visit: Payer: Self-pay | Admitting: *Deleted

## 2014-06-06 DIAGNOSIS — C8280 Other types of follicular lymphoma, unspecified site: Secondary | ICD-10-CM

## 2014-06-06 MED ORDER — FENTANYL 12 MCG/HR TD PT72: 12.5000 ug | MEDICATED_PATCH | TRANSDERMAL | Status: DC

## 2014-06-06 NOTE — Telephone Encounter (Signed)
Patient requesting refill for Fentanyl patch 12 mcg/hr.  She would like to pick that up tomorrow, 06/07/14.

## 2014-06-08 ENCOUNTER — Ambulatory Visit (HOSPITAL_BASED_OUTPATIENT_CLINIC_OR_DEPARTMENT_OTHER): Payer: BC Managed Care – PPO

## 2014-06-08 VITALS — BP 111/51 | HR 58 | Temp 97.5°F | Resp 20

## 2014-06-08 DIAGNOSIS — Z95828 Presence of other vascular implants and grafts: Secondary | ICD-10-CM

## 2014-06-08 DIAGNOSIS — Z452 Encounter for adjustment and management of vascular access device: Secondary | ICD-10-CM

## 2014-06-08 DIAGNOSIS — C8298 Follicular lymphoma, unspecified, lymph nodes of multiple sites: Secondary | ICD-10-CM

## 2014-06-08 MED ORDER — SODIUM CHLORIDE 0.9 % IJ SOLN
10.0000 mL | INTRAMUSCULAR | Status: DC | PRN
  Administered 2014-06-08: 10 mL via INTRAVENOUS
  Filled 2014-06-08: qty 10

## 2014-06-08 MED ORDER — HEPARIN SOD (PORK) LOCK FLUSH 100 UNIT/ML IV SOLN
500.0000 [IU] | Freq: Once | INTRAVENOUS | Status: AC
  Administered 2014-06-08: 500 [IU] via INTRAVENOUS
  Filled 2014-06-08: qty 5

## 2014-06-08 NOTE — Patient Instructions (Signed)

## 2014-07-05 ENCOUNTER — Telehealth: Payer: Self-pay | Admitting: *Deleted

## 2014-07-05 ENCOUNTER — Other Ambulatory Visit: Payer: Self-pay | Admitting: *Deleted

## 2014-07-05 DIAGNOSIS — C8280 Other types of follicular lymphoma, unspecified site: Secondary | ICD-10-CM

## 2014-07-05 MED ORDER — FENTANYL 12 MCG/HR TD PT72: 12.5000 ug | MEDICATED_PATCH | TRANSDERMAL | Status: DC

## 2014-07-05 NOTE — Telephone Encounter (Signed)
Received VM on triage line from patient requesting refill on Fentanyl patches. Patient states she will need the new patches by tomorrow.  Message routed to Beverlee Nims, [place] with Dr. Lindi Adie today.

## 2014-07-20 ENCOUNTER — Ambulatory Visit (HOSPITAL_BASED_OUTPATIENT_CLINIC_OR_DEPARTMENT_OTHER): Payer: Medicare Other

## 2014-07-20 VITALS — BP 102/49 | HR 61 | Temp 97.8°F | Resp 18

## 2014-07-20 DIAGNOSIS — Z452 Encounter for adjustment and management of vascular access device: Secondary | ICD-10-CM | POA: Diagnosis not present

## 2014-07-20 DIAGNOSIS — C8298 Follicular lymphoma, unspecified, lymph nodes of multiple sites: Secondary | ICD-10-CM | POA: Diagnosis not present

## 2014-07-20 DIAGNOSIS — Z95828 Presence of other vascular implants and grafts: Secondary | ICD-10-CM

## 2014-07-20 MED ORDER — SODIUM CHLORIDE 0.9 % IJ SOLN
10.0000 mL | INTRAMUSCULAR | Status: DC | PRN
  Administered 2014-07-20: 10 mL via INTRAVENOUS
  Filled 2014-07-20: qty 10

## 2014-07-20 MED ORDER — HEPARIN SOD (PORK) LOCK FLUSH 100 UNIT/ML IV SOLN
500.0000 [IU] | Freq: Once | INTRAVENOUS | Status: AC
  Administered 2014-07-20: 500 [IU] via INTRAVENOUS
  Filled 2014-07-20: qty 5

## 2014-07-20 NOTE — Patient Instructions (Signed)

## 2014-08-14 ENCOUNTER — Telehealth: Payer: Self-pay | Admitting: *Deleted

## 2014-08-14 ENCOUNTER — Other Ambulatory Visit: Payer: Self-pay

## 2014-08-14 DIAGNOSIS — C8280 Other types of follicular lymphoma, unspecified site: Secondary | ICD-10-CM

## 2014-08-14 MED ORDER — FENTANYL 12 MCG/HR TD PT72: 12.5000 ug | MEDICATED_PATCH | TRANSDERMAL | Status: DC

## 2014-08-14 NOTE — Telephone Encounter (Signed)
LMOVM - prescription ready, can be picked up until 4 pm.

## 2014-08-14 NOTE — Telephone Encounter (Signed)
Voicemail from patient requesting refill on Fentanyl 12 mg patches.  May be reached via mobile number [phone] or [phone].  Will notify Dr. Lindi Adie of this request.

## 2014-09-01 ENCOUNTER — Ambulatory Visit (HOSPITAL_BASED_OUTPATIENT_CLINIC_OR_DEPARTMENT_OTHER): Payer: Medicare Other

## 2014-09-01 VITALS — BP 110/51 | HR 50 | Temp 97.9°F | Resp 18

## 2014-09-01 DIAGNOSIS — C8298 Follicular lymphoma, unspecified, lymph nodes of multiple sites: Secondary | ICD-10-CM

## 2014-09-01 DIAGNOSIS — Z452 Encounter for adjustment and management of vascular access device: Secondary | ICD-10-CM | POA: Diagnosis present

## 2014-09-01 DIAGNOSIS — Z95828 Presence of other vascular implants and grafts: Secondary | ICD-10-CM

## 2014-09-01 MED ORDER — SODIUM CHLORIDE 0.9 % IJ SOLN
10.0000 mL | INTRAMUSCULAR | Status: DC | PRN
  Administered 2014-09-01: 10 mL via INTRAVENOUS
  Filled 2014-09-01: qty 10

## 2014-09-01 MED ORDER — HEPARIN SOD (PORK) LOCK FLUSH 100 UNIT/ML IV SOLN
500.0000 [IU] | Freq: Once | INTRAVENOUS | Status: AC
  Administered 2014-09-01: 500 [IU] via INTRAVENOUS
  Filled 2014-09-01: qty 5

## 2014-09-01 NOTE — Patient Instructions (Signed)

## 2014-09-04 ENCOUNTER — Other Ambulatory Visit: Payer: Self-pay

## 2014-09-04 DIAGNOSIS — Z1231 Encounter for screening mammogram for malignant neoplasm of breast: Secondary | ICD-10-CM

## 2014-09-08 ENCOUNTER — Ambulatory Visit
Admission: RE | Admit: 2014-09-08 | Discharge: 2014-09-08 | Disposition: A | Discharge status: Home / Self Care | Payer: Medicare Other | Source: Ambulatory Visit

## 2014-09-08 DIAGNOSIS — Z1231 Encounter for screening mammogram for malignant neoplasm of breast: Secondary | ICD-10-CM | POA: Diagnosis not present

## 2014-09-12 ENCOUNTER — Encounter (HOSPITAL_COMMUNITY): Payer: Self-pay

## 2014-09-12 ENCOUNTER — Other Ambulatory Visit (HOSPITAL_COMMUNITY): Payer: Self-pay | Admitting: Internal Medicine

## 2014-09-12 ENCOUNTER — Ambulatory Visit (HOSPITAL_COMMUNITY)
Admission: RE | Admit: 2014-09-12 | Discharge: 2014-09-12 | Disposition: A | Discharge status: Home / Self Care | Payer: Medicare Other | Source: Ambulatory Visit | Attending: Internal Medicine | Admitting: Internal Medicine

## 2014-09-12 DIAGNOSIS — M810 Age-related osteoporosis without current pathological fracture: Secondary | ICD-10-CM | POA: Diagnosis not present

## 2014-09-12 MED ORDER — DENOSUMAB 60 MG/ML ~~LOC~~ SOLN
60.0000 mg | Freq: Once | SUBCUTANEOUS | Status: AC
  Administered 2014-09-12: 60 mg via SUBCUTANEOUS
  Filled 2014-09-12: qty 1

## 2014-09-12 NOTE — Discharge Instructions (Signed)
Denosumab injection What is this medicine? DENOSUMAB (den oh sue mab) slows bone breakdown. Prolia is used to treat osteoporosis in women after menopause and in men. Xgeva is used to prevent bone fractures and other bone problems caused by cancer bone metastases. Xgeva is also used to treat giant cell tumor of the bone. This medicine may be used for other purposes; ask your health care provider or pharmacist if you have questions. COMMON BRAND NAME(S): Prolia, XGEVA What should I tell my health care provider before I take this medicine? They need to know if you have any of these conditions: -dental disease -eczema -infection or history of infections -kidney disease or on dialysis -low blood calcium or vitamin D -malabsorption syndrome -scheduled to have surgery or tooth extraction -taking medicine that contains denosumab -thyroid or parathyroid disease -an unusual reaction to denosumab, other medicines, foods, dyes, or preservatives -pregnant or trying to get pregnant -breast-feeding How should I use this medicine? This medicine is for injection under the skin. It is given by a health care professional in a hospital or clinic setting. If you are getting Prolia, a special MedGuide will be given to you by the pharmacist with each prescription and refill. Be sure to read this information carefully each time. For Prolia, talk to your pediatrician regarding the use of this medicine in children. Special care may be needed. For Xgeva, talk to your pediatrician regarding the use of this medicine in children. While this drug may be prescribed for children as young as 13 years for selected conditions, precautions do apply. Overdosage: If you think you've taken too much of this medicine contact a poison control center or emergency room at once. Overdosage: If you think you have taken too much of this medicine contact a poison control center or emergency room at once. NOTE: This medicine is only for  you. Do not share this medicine with others. What if I miss a dose? It is important not to miss your dose. Call your doctor or health care professional if you are unable to keep an appointment. What may interact with this medicine? Do not take this medicine with any of the following medications: -other medicines containing denosumab This medicine may also interact with the following medications: -medicines that suppress the immune system -medicines that treat cancer -steroid medicines like prednisone or cortisone This list may not describe all possible interactions. Give your health care provider a list of all the medicines, herbs, non-prescription drugs, or dietary supplements you use. Also tell them if you smoke, drink alcohol, or use illegal drugs. Some items may interact with your medicine. What should I watch for while using this medicine? Visit your doctor or health care professional for regular checks on your progress. Your doctor or health care professional may order blood tests and other tests to see how you are doing. Call your doctor or health care professional if you get a cold or other infection while receiving this medicine. Do not treat yourself. This medicine may decrease your body's ability to fight infection. You should make sure you get enough calcium and vitamin D while you are taking this medicine, unless your doctor tells you not to. Discuss the foods you eat and the vitamins you take with your health care professional. See your dentist regularly. Brush and floss your teeth as directed. Before you have any dental work done, tell your dentist you are receiving this medicine. Do not become pregnant while taking this medicine or for 5 months after stopping   it. Women should inform their doctor if they wish to become pregnant or think they might be pregnant. There is a potential for serious side effects to an unborn child. Talk to your health care professional or pharmacist for more  information. What side effects may I notice from receiving this medicine? Side effects that you should report to your doctor or health care professional as soon as possible: -allergic reactions like skin rash, itching or hives, swelling of the face, lips, or tongue -breathing problems -chest pain -fast, irregular heartbeat -feeling faint or lightheaded, falls -fever, chills, or any other sign of infection -muscle spasms, tightening, or twitches -numbness or tingling -skin blisters or bumps, or is dry, peels, or red -slow healing or unexplained pain in the mouth or jaw -unusual bleeding or bruising Side effects that usually do not require medical attention (Report these to your doctor or health care professional if they continue or are bothersome.): -muscle pain -stomach upset, gas This list may not describe all possible side effects. Call your doctor for medical advice about side effects. You may report side effects to FDA at 1-800-FDA-1088. Where should I keep my medicine? This medicine is only given in a clinic, doctor's office, or other health care setting and will not be stored at home. NOTE: This sheet is a summary. It may not cover all possible information. If you have questions about this medicine, talk to your doctor, pharmacist, or health care provider.  2015, Elsevier/Gold Standard. (2011-09-08 12:37:47)  

## 2014-09-18 ENCOUNTER — Other Ambulatory Visit: Payer: Self-pay

## 2014-09-19 DIAGNOSIS — H6690 Otitis media, unspecified, unspecified ear: Secondary | ICD-10-CM | POA: Diagnosis not present

## 2014-09-19 DIAGNOSIS — Z6822 Body mass index (BMI) 22.0-22.9, adult: Secondary | ICD-10-CM | POA: Diagnosis not present

## 2014-09-21 ENCOUNTER — Other Ambulatory Visit: Payer: Self-pay | Admitting: *Deleted

## 2014-09-21 ENCOUNTER — Telehealth: Payer: Self-pay | Admitting: *Deleted

## 2014-09-21 DIAGNOSIS — C8280 Other types of follicular lymphoma, unspecified site: Secondary | ICD-10-CM

## 2014-09-21 MED ORDER — FENTANYL 12 MCG/HR TD PT72: 12.5000 ug | MEDICATED_PATCH | TRANSDERMAL | Status: DC

## 2014-09-21 NOTE — Telephone Encounter (Signed)
PT. IS REQUESTING A PRESCRIPTION FOR FENTANYL 12MCQ PATCH. SHE WOULD LIKE TO PICK UP PRESCRIPTION ON Friday. CALL PT. AT [phone] WHEN THE PRESCRIPTION IS READY

## 2014-09-21 NOTE — Telephone Encounter (Signed)
Left VMM on patient's cell that prescription is ready for pick up.

## 2014-10-06 ENCOUNTER — Telehealth: Payer: Self-pay | Admitting: Internal Medicine

## 2014-10-06 NOTE — Telephone Encounter (Signed)
Patient has had hives for several days, she is itching and red all over. Patient went by the [facility] and they did not have any openings. I advised patient to go to [facility].  Patient said she was at the [facility] right now and that she would get directions from them to go to the UC. Nothing further needed.

## 2014-10-06 NOTE — Telephone Encounter (Signed)
Left detailed message on voicemail advising patient that if she is having an allergic reaction she needs to go to Urgent care or ER.  Asked patient to call back if she still wants to schedule appointment. Nothing further needed.

## 2014-10-12 ENCOUNTER — Ambulatory Visit (HOSPITAL_BASED_OUTPATIENT_CLINIC_OR_DEPARTMENT_OTHER): Payer: Medicare Other

## 2014-10-12 VITALS — BP 123/50 | HR 64 | Temp 98.8°F | Resp 18

## 2014-10-12 DIAGNOSIS — C8298 Follicular lymphoma, unspecified, lymph nodes of multiple sites: Secondary | ICD-10-CM

## 2014-10-12 DIAGNOSIS — Z452 Encounter for adjustment and management of vascular access device: Secondary | ICD-10-CM | POA: Diagnosis present

## 2014-10-12 DIAGNOSIS — Z95828 Presence of other vascular implants and grafts: Secondary | ICD-10-CM

## 2014-10-12 MED ORDER — SODIUM CHLORIDE 0.9 % IJ SOLN
10.0000 mL | INTRAMUSCULAR | Status: DC | PRN
  Administered 2014-10-12: 10 mL via INTRAVENOUS
  Filled 2014-10-12: qty 10

## 2014-10-12 MED ORDER — HEPARIN SOD (PORK) LOCK FLUSH 100 UNIT/ML IV SOLN
500.0000 [IU] | Freq: Once | INTRAVENOUS | Status: AC
  Administered 2014-10-12: 500 [IU] via INTRAVENOUS
  Filled 2014-10-12: qty 5

## 2014-10-12 NOTE — Patient Instructions (Signed)

## 2014-10-20 ENCOUNTER — Telehealth: Payer: Self-pay | Admitting: *Deleted

## 2014-10-20 DIAGNOSIS — C8280 Other types of follicular lymphoma, unspecified site: Secondary | ICD-10-CM

## 2014-10-20 NOTE — Telephone Encounter (Signed)
PT. WILL NEED PRESCRIPTION FOR FENTANYL PATCH BY Monday AT NOON.

## 2014-10-23 MED ORDER — FENTANYL 12 MCG/HR TD PT72: 12.5000 ug | MEDICATED_PATCH | TRANSDERMAL | Status: DC

## 2014-10-23 NOTE — Addendum Note (Signed)
Addended by: Prentiss Bells on: 10/23/2014 09:42 AM   Modules accepted: Orders

## 2014-11-17 DIAGNOSIS — I10 Essential (primary) hypertension: Secondary | ICD-10-CM | POA: Diagnosis not present

## 2014-11-17 DIAGNOSIS — E559 Vitamin D deficiency, unspecified: Secondary | ICD-10-CM | POA: Diagnosis not present

## 2014-11-17 DIAGNOSIS — N390 Urinary tract infection, site not specified: Secondary | ICD-10-CM | POA: Diagnosis not present

## 2014-11-17 DIAGNOSIS — E785 Hyperlipidemia, unspecified: Secondary | ICD-10-CM | POA: Diagnosis not present

## 2014-11-17 DIAGNOSIS — R8290 Unspecified abnormal findings in urine: Secondary | ICD-10-CM | POA: Diagnosis not present

## 2014-11-24 ENCOUNTER — Other Ambulatory Visit: Payer: Self-pay | Admitting: *Deleted

## 2014-11-24 DIAGNOSIS — Z1389 Encounter for screening for other disorder: Secondary | ICD-10-CM | POA: Diagnosis not present

## 2014-11-24 DIAGNOSIS — Z1212 Encounter for screening for malignant neoplasm of rectum: Secondary | ICD-10-CM | POA: Diagnosis not present

## 2014-11-24 DIAGNOSIS — G459 Transient cerebral ischemic attack, unspecified: Secondary | ICD-10-CM | POA: Diagnosis not present

## 2014-11-24 DIAGNOSIS — Z0000 Encounter for general adult medical examination without abnormal findings: Secondary | ICD-10-CM | POA: Diagnosis not present

## 2014-11-24 DIAGNOSIS — Z6821 Body mass index (BMI) 21.0-21.9, adult: Secondary | ICD-10-CM | POA: Diagnosis not present

## 2014-11-24 DIAGNOSIS — K222 Esophageal obstruction: Secondary | ICD-10-CM | POA: Diagnosis not present

## 2014-11-24 DIAGNOSIS — Z23 Encounter for immunization: Secondary | ICD-10-CM | POA: Diagnosis not present

## 2014-11-24 DIAGNOSIS — I10 Essential (primary) hypertension: Secondary | ICD-10-CM | POA: Diagnosis not present

## 2014-11-24 DIAGNOSIS — C8280 Other types of follicular lymphoma, unspecified site: Secondary | ICD-10-CM

## 2014-11-24 DIAGNOSIS — E559 Vitamin D deficiency, unspecified: Secondary | ICD-10-CM | POA: Diagnosis not present

## 2014-11-24 DIAGNOSIS — M810 Age-related osteoporosis without current pathological fracture: Secondary | ICD-10-CM | POA: Diagnosis not present

## 2014-11-24 DIAGNOSIS — E785 Hyperlipidemia, unspecified: Secondary | ICD-10-CM | POA: Diagnosis not present

## 2014-11-24 DIAGNOSIS — N183 Chronic kidney disease, stage 3 (moderate): Secondary | ICD-10-CM | POA: Diagnosis not present

## 2014-11-24 DIAGNOSIS — I129 Hypertensive chronic kidney disease with stage 1 through stage 4 chronic kidney disease, or unspecified chronic kidney disease: Secondary | ICD-10-CM | POA: Diagnosis not present

## 2014-11-24 NOTE — Telephone Encounter (Signed)
PLEASE CALL PT. WHEN PRESCRIPTION IS READY. SHE WILL NEED A PATCH FOR Tuesday.

## 2014-11-28 MED ORDER — FENTANYL 12 MCG/HR TD PT72
12.5000 ug | MEDICATED_PATCH | TRANSDERMAL | Status: DC
Start: 2014-11-28 — End: 2014-12-25

## 2014-11-28 NOTE — Telephone Encounter (Signed)
Let pt know prescription is ready to be picked up before 4 pm today.  Pt voiced udnerstanding.

## 2014-11-28 NOTE — Addendum Note (Signed)
Addended by: Prentiss Bells on: 11/28/2014 12:23 PM   Modules accepted: Orders

## 2014-11-29 DIAGNOSIS — H35371 Puckering of macula, right eye: Secondary | ICD-10-CM | POA: Diagnosis not present

## 2014-11-29 DIAGNOSIS — H01001 Unspecified blepharitis right upper eyelid: Secondary | ICD-10-CM | POA: Diagnosis not present

## 2014-11-29 DIAGNOSIS — H01004 Unspecified blepharitis left upper eyelid: Secondary | ICD-10-CM | POA: Diagnosis not present

## 2014-11-29 DIAGNOSIS — Z961 Presence of intraocular lens: Secondary | ICD-10-CM | POA: Diagnosis not present

## 2014-11-30 ENCOUNTER — Other Ambulatory Visit: Payer: Medicare Other

## 2014-11-30 ENCOUNTER — Ambulatory Visit: Payer: Medicare Other | Admitting: Hematology and Oncology

## 2014-12-01 ENCOUNTER — Other Ambulatory Visit: Payer: Self-pay

## 2014-12-01 DIAGNOSIS — C8280 Other types of follicular lymphoma, unspecified site: Secondary | ICD-10-CM

## 2014-12-04 ENCOUNTER — Other Ambulatory Visit (HOSPITAL_BASED_OUTPATIENT_CLINIC_OR_DEPARTMENT_OTHER): Payer: Medicare Other

## 2014-12-04 ENCOUNTER — Ambulatory Visit (HOSPITAL_BASED_OUTPATIENT_CLINIC_OR_DEPARTMENT_OTHER): Payer: Medicare Other | Admitting: Hematology and Oncology

## 2014-12-04 ENCOUNTER — Encounter: Payer: Self-pay | Admitting: Hematology and Oncology

## 2014-12-04 ENCOUNTER — Telehealth: Payer: Self-pay | Admitting: Hematology and Oncology

## 2014-12-04 ENCOUNTER — Ambulatory Visit (HOSPITAL_BASED_OUTPATIENT_CLINIC_OR_DEPARTMENT_OTHER): Payer: Medicare Other

## 2014-12-04 VITALS — BP 131/51 | HR 56 | Temp 97.8°F | Resp 18 | Ht 60.0 in | Wt 110.6 lb

## 2014-12-04 DIAGNOSIS — C8298 Follicular lymphoma, unspecified, lymph nodes of multiple sites: Secondary | ICD-10-CM

## 2014-12-04 DIAGNOSIS — C8590 Non-Hodgkin lymphoma, unspecified, unspecified site: Secondary | ICD-10-CM

## 2014-12-04 DIAGNOSIS — C8280 Other types of follicular lymphoma, unspecified site: Secondary | ICD-10-CM

## 2014-12-04 LAB — CBC WITH DIFFERENTIAL/PLATELET
BASO%: 0 % (ref 0.0–2.0)
BASOS ABS: 0 10*3/uL (ref 0.0–0.1)
EOS%: 0 % (ref 0.0–7.0)
Eosinophils Absolute: 0 10*3/uL (ref 0.0–0.5)
HEMATOCRIT: 39.2 % (ref 34.8–46.6)
HEMOGLOBIN: 13.2 g/dL (ref 11.6–15.9)
LYMPH#: 1.1 10*3/uL (ref 0.9–3.3)
LYMPH%: 19.5 % (ref 14.0–49.7)
MCH: 30.1 pg (ref 25.1–34.0)
MCHC: 33.7 g/dL (ref 31.5–36.0)
MCV: 89.3 fL (ref 79.5–101.0)
MONO#: 0.6 10*3/uL (ref 0.1–0.9)
MONO%: 10.2 % (ref 0.0–14.0)
NEUT#: 4 10*3/uL (ref 1.5–6.5)
NEUT%: 70.3 % (ref 38.4–76.8)
Platelets: 242 10*3/uL (ref 145–400)
RBC: 4.39 10*6/uL (ref 3.70–5.45)
RDW: 12.7 % (ref 11.2–14.5)
WBC: 5.7 10*3/uL (ref 3.9–10.3)

## 2014-12-04 LAB — LACTATE DEHYDROGENASE (CC13): LDH: 203 U/L (ref 125–245)

## 2014-12-04 MED ORDER — HEPARIN SOD (PORK) LOCK FLUSH 100 UNIT/ML IV SOLN
500.0000 [IU] | Freq: Once | INTRAVENOUS | Status: AC
  Administered 2014-12-04: 500 [IU] via INTRAVENOUS
  Filled 2014-12-04: qty 5

## 2014-12-04 MED ORDER — SODIUM CHLORIDE 0.9 % IJ SOLN
10.0000 mL | INTRAMUSCULAR | Status: DC | PRN
  Administered 2014-12-04: 10 mL via INTRAVENOUS
  Filled 2014-12-04: qty 10

## 2014-12-04 NOTE — Progress Notes (Signed)
Patient Care Team: Marco Collie, MD as PCP - General (Family Medicine) Armandina Gemma, MD as Consulting Physician (General Surgery) Ronald Lobo, MD as Consulting Physician (Gastroenterology) Consuela Mimes, MD as Consulting Physician (Medical Oncology)  DIAGNOSIS: No matching staging information was found for the patient.  SUMMARY OF ONCOLOGIC HISTORY:   Follicular low grade B-cell lymphoma   07/22/2011 Initial Diagnosis Follicular low grade B-cell lymphoma   07/29/2011 PET scan Bilateral axillary, subpectoral and an, 5 mm K. node, right-sided pelvic sidewall and then 1.9 cm SUV 14.8, bilateral right greater than left inguinal adenopathy 2.4 x 3.3 right groin   09/05/2011 - 12/10/2011 Chemotherapy Rituxan CVP x4 cycles   11/11/2011 PET scan Near-complete response with only single small right inguinal lymph node with persistent low-grade SUV 3.2 activity   11/24/2011 Surgery Colon Segmental resection: Extensive suppurative acute serositis 1 benign lymph node    CHIEF COMPLIANT: Annual follow-up of lymphoma  INTERVAL HISTORY: [name] is a 81 year old with above-mentioned history of follicular low-grade B-cell lymphoma diagnosed in 2013. She is 3 years from completion of treatment and is in remission. She reports no major problems or concerns. She has had problems with her colon and she has intermittent abdominal pains related to that. She had a segmental colon resection. She reports no new problems from the lymphoma. She denies any fevers chills night sweats weight loss or any palpable lymphadenopathy.  REVIEW OF SYSTEMS:   Constitutional: Denies fevers, chills or abnormal weight loss Eyes: Denies blurriness of vision Ears, nose, mouth, throat, and face: Denies mucositis or sore throat Respiratory: Denies cough, dyspnea or wheezes Cardiovascular: Denies palpitation, chest discomfort or lower extremity swelling Gastrointestinal:  Denies nausea, heartburn or change in bowel habits Skin: Denies  abnormal skin rashes Lymphatics: Denies new lymphadenopathy or easy bruising Neurological:Denies numbness, tingling or new weaknesses Behavioral/Psych: Mood is stable, no new changes  All other systems were reviewed with the patient and are negative.  I have reviewed the past medical history, past surgical history, social history and family history with the patient and they are unchanged from previous note.  ALLERGIES:  is allergic to codeine; other; penicillins; and sulfa antibiotics.  MEDICATIONS:  Current Outpatient Prescriptions  Medication Sig Dispense Refill  . famotidine (PEPCID) 20 MG tablet Take 20 mg by mouth 2 (two) times daily.    . fentaNYL (DURAGESIC - DOSED MCG/HR) 12 MCG/HR Place 1 patch (12.5 mcg total) onto the skin every 3 (three) days. 10 patch 0  . megestrol (MEGACE) 40 MG/ML suspension Take 400 mg by mouth 2 (two) times daily.     [name] amLODipine-atorvastatin (CADUET) 5-40 MG per tablet Take 1 tablet by mouth daily.     . clopidogrel (PLAVIX) 75 MG tablet Take 75 mg by mouth daily.    [name] ezetimibe (ZETIA) 10 MG tablet Take 10 mg by mouth daily.    [name] HYDROcodone-acetaminophen (NORCO/VICODIN) 5-325 MG per tablet Take 1-2 tablets by mouth every 4 (four) hours as needed for moderate pain. (Patient not taking: Reported on 12/04/2014) 30 tablet 0  . metoprolol (LOPRESSOR) 50 MG tablet Take 50 mg by mouth 2 (two) times daily.    . naproxen (NAPROSYN) 250 MG tablet Take 250 mg by mouth 2 (two) times daily as needed (pain).    . ondansetron (ZOFRAN) 4 MG tablet Take 1 tablet (4 mg total) by mouth every 6 (six) hours as needed for nausea. 45 tablet 0  . polyethylene glycol (MIRALAX / GLYCOLAX) packet Take by mouth.    [name]  simethicone (MYLICON) 80 MG chewable tablet Chew 80 mg by mouth every 6 (six) hours as needed for flatulence.    . valsartan (DIOVAN) 160 MG tablet Take 160 mg by mouth daily.     . Vitamin D, Ergocalciferol, (DRISDOL) 50000 UNITS CAPS capsule Take by mouth.     No  current facility-administered medications for this visit.    PHYSICAL EXAMINATION: ECOG PERFORMANCE STATUS: 1 - Symptomatic but completely ambulatory  Filed Vitals:   12/04/14 1138  BP: 131/51  Pulse: 56  Temp: 97.8 F (36.6 C)  Resp: 18   Filed Weights   12/04/14 1138  Weight: 110 lb 9.6 oz (50.168 kg)    GENERAL:alert, no distress and comfortable SKIN: skin color, texture, turgor are normal, no rashes or significant lesions EYES: normal, Conjunctiva are pink and non-injected, sclera clear OROPHARYNX:no exudate, no erythema and lips, buccal mucosa, and tongue normal  NECK: supple, thyroid normal size, non-tender, without nodularity LYMPH:  no palpable lymphadenopathy in the cervical, axillary or inguinal LUNGS: clear to auscultation and percussion with normal breathing effort HEART: regular rate & rhythm and no murmurs and no lower extremity edema ABDOMEN:abdomen soft, non-tender and normal bowel sounds Musculoskeletal:no cyanosis of digits and no clubbing  NEURO: alert & oriented x 3 with fluent speech, no focal motor/sensory deficits  LABORATORY DATA:  I have reviewed the data as listed   Chemistry      Component Value Date/Time   NA 139 12/22/2013 1147   NA 140 05/22/2013 0410   K 4.6 12/22/2013 1147   K 3.9 05/22/2013 0410   CL 107 05/22/2013 0410   CL 106 06/28/2012 1131   CO2 24 12/22/2013 1147   CO2 19 05/22/2013 0410   BUN 28.1* 12/22/2013 1147   BUN 4* 05/22/2013 0410   CREATININE 1.2* 12/22/2013 1147   CREATININE 0.64 05/22/2013 0410      Component Value Date/Time   CALCIUM 9.7 12/22/2013 1147   CALCIUM 7.5* 05/22/2013 0410   ALKPHOS 36* 12/22/2013 1147   ALKPHOS 41 05/14/2013 0515   AST 17 12/22/2013 1147   AST 21 05/14/2013 0515   ALT 10 12/22/2013 1147   ALT 17 05/14/2013 0515   BILITOT 0.91 12/22/2013 1147   BILITOT 0.5 05/14/2013 0515       Lab Results  Component Value Date   WBC 5.7 12/04/2014   HGB 13.2 12/04/2014   HCT 39.2  12/04/2014   MCV 89.3 12/04/2014   PLT 242 12/04/2014   NEUTROABS 4.0 12/04/2014   ASSESSMENT & PLAN:  Follicular low grade B-cell lymphoma Follicular lymphoma: Treated with R. CVP in 2013 with a near complete response based on PET/CT. Today's clinical exam does not reveal any significant lymphadenopathy in the neck axillary or inguinal areas. Right cervical area very tiny palpable nodularity suspicious for small cervical lymph nodes. They appear to be very small and insignificant.  I reviewed her previous blood work from which was normal. I discussed with her that the surveillance for follicular lymphoma would be with periodic physical exams and blood work including [place]. There is no indication for doing routine CT scans.  Abdominal pain: With a history of previous colon resection with a colostomy after chemotherapy for the lymphoma. February 7846 complication with herniation of the stomach through the colostomy led to urgent surgery to repair the colostomy and reanastomose.  Patient continues to have chronic mild abdominal pain.   Health surveillance: 1. Mammogram 09/08/2014 is normal breast density category B  Return to clinic  in 1 year for clinical examination and blood work and follow-up.   No orders of the defined types were placed in this encounter.   The patient has a good understanding of the overall plan. she agrees with it. she will call with any problems that may develop before the next visit here.   Rulon Eisenmenger, MD

## 2014-12-04 NOTE — Progress Notes (Signed)
Labs received from patient - [facility] dtd 11/17/14.  Reviewed by Dr. Lindi Adie.  Sent to scan.

## 2014-12-04 NOTE — Telephone Encounter (Signed)
Appointments made and avs has been mailed to patient

## 2014-12-04 NOTE — Assessment & Plan Note (Signed)
Follicular lymphoma: Treated with R. CVP in 2013 with a near complete response based on PET/CT. Today's clinical exam does not reveal any lymphadenopathy in the neck axillary or inguinal areas. I reviewed her previous blood work from March which was normal. I discussed with her that the surveillance for follicular lymphoma would be with periodic physical exams and blood work including [place]. There is no indication for doing routine CT scans.  Abdominal pain: With a history of previous colon resection with a colostomy after chemotherapy for the lymphoma. February 2811 complication with herniation of the stomach through the colostomy led to urgent surgery to repair the colostomy and reanastomose.  Patient continues to have chronic mild abdominal pain.  Return to clinic in 1 year for clinical examination and blood work and follow-up.

## 2014-12-04 NOTE — Patient Instructions (Signed)

## 2014-12-25 ENCOUNTER — Telehealth: Payer: Self-pay | Admitting: *Deleted

## 2014-12-25 ENCOUNTER — Other Ambulatory Visit: Payer: Self-pay

## 2014-12-25 DIAGNOSIS — C8280 Other types of follicular lymphoma, unspecified site: Secondary | ICD-10-CM

## 2014-12-25 MED ORDER — FENTANYL 12 MCG/HR TD PT72: 12.5000 ug | MEDICATED_PATCH | TRANSDERMAL | Status: DC

## 2014-12-25 NOTE — Telephone Encounter (Signed)
FYI 1538 VOICEMAIL RETRIEVED AT [phone], FORWARDED AT 1711 "I need Fentanyl refill.  I am a patient of Dr. Geralyn Flash."

## 2014-12-25 NOTE — Telephone Encounter (Signed)
Let pt know fentanyl prescription will be in the book for pickup tomorrow between 9 am and 4 pm.  Pt voiced understanding.

## 2014-12-25 NOTE — Telephone Encounter (Signed)
CALL PT. WHEN PRESCRIPTION IS READY. 

## 2014-12-26 NOTE — Telephone Encounter (Signed)
Refill prepared 12-25-2014.

## 2015-01-09 DIAGNOSIS — Z23 Encounter for immunization: Secondary | ICD-10-CM | POA: Diagnosis not present

## 2015-01-10 DIAGNOSIS — M2669 Other specified disorders of temporomandibular joint: Secondary | ICD-10-CM | POA: Diagnosis not present

## 2015-01-10 DIAGNOSIS — H9202 Otalgia, left ear: Secondary | ICD-10-CM | POA: Diagnosis not present

## 2015-01-10 DIAGNOSIS — C8290 Follicular lymphoma, unspecified, unspecified site: Secondary | ICD-10-CM | POA: Diagnosis not present

## 2015-01-17 ENCOUNTER — Other Ambulatory Visit: Payer: Self-pay | Admitting: *Deleted

## 2015-01-17 DIAGNOSIS — C8280 Other types of follicular lymphoma, unspecified site: Secondary | ICD-10-CM

## 2015-01-17 MED ORDER — SODIUM CHLORIDE 0.9 % IJ SOLN
10.0000 mL | Freq: Once | INTRAMUSCULAR | Status: DC
Start: 2015-01-18 — End: 2015-01-17
  Filled 2015-01-17: qty 10

## 2015-01-17 MED ORDER — HEPARIN SOD (PORK) LOCK FLUSH 100 UNIT/ML IV SOLN
500.0000 [IU] | Freq: Once | INTRAVENOUS | Status: DC
  Filled 2015-01-17: qty 5

## 2015-01-18 ENCOUNTER — Ambulatory Visit (HOSPITAL_BASED_OUTPATIENT_CLINIC_OR_DEPARTMENT_OTHER): Payer: Medicare Other

## 2015-01-18 VITALS — BP 123/53 | HR 58 | Temp 97.1°F | Resp 18

## 2015-01-18 DIAGNOSIS — Z452 Encounter for adjustment and management of vascular access device: Secondary | ICD-10-CM | POA: Diagnosis present

## 2015-01-18 DIAGNOSIS — C8298 Follicular lymphoma, unspecified, lymph nodes of multiple sites: Secondary | ICD-10-CM

## 2015-01-18 DIAGNOSIS — C8280 Other types of follicular lymphoma, unspecified site: Secondary | ICD-10-CM

## 2015-01-18 MED ORDER — HEPARIN SOD (PORK) LOCK FLUSH 100 UNIT/ML IV SOLN
500.0000 [IU] | Freq: Once | INTRAVENOUS | Status: AC
  Administered 2015-01-18: 500 [IU]
  Filled 2015-01-18: qty 5

## 2015-01-18 MED ORDER — SODIUM CHLORIDE 0.9 % IJ SOLN
10.0000 mL | Freq: Once | INTRAMUSCULAR | Status: AC
  Administered 2015-01-18: 10 mL
  Filled 2015-01-18: qty 10

## 2015-01-25 ENCOUNTER — Telehealth: Payer: Self-pay

## 2015-01-25 DIAGNOSIS — C8280 Other types of follicular lymphoma, unspecified site: Secondary | ICD-10-CM

## 2015-01-25 MED ORDER — FENTANYL 12 MCG/HR TD PT72: 12.5000 ug | MEDICATED_PATCH | TRANSDERMAL | Status: DC

## 2015-01-25 NOTE — Telephone Encounter (Signed)
Pt called for fentanyl refill. rx prepared for signature.

## 2015-02-20 DIAGNOSIS — J019 Acute sinusitis, unspecified: Secondary | ICD-10-CM | POA: Diagnosis not present

## 2015-02-22 DIAGNOSIS — H6980 Other specified disorders of Eustachian tube, unspecified ear: Secondary | ICD-10-CM | POA: Diagnosis not present

## 2015-02-22 DIAGNOSIS — I10 Essential (primary) hypertension: Secondary | ICD-10-CM | POA: Diagnosis not present

## 2015-02-22 DIAGNOSIS — J209 Acute bronchitis, unspecified: Secondary | ICD-10-CM | POA: Diagnosis not present

## 2015-02-22 DIAGNOSIS — Z8579 Personal history of other malignant neoplasms of lymphoid, hematopoietic and related tissues: Secondary | ICD-10-CM | POA: Diagnosis not present

## 2015-02-22 DIAGNOSIS — Z6821 Body mass index (BMI) 21.0-21.9, adult: Secondary | ICD-10-CM | POA: Diagnosis not present

## 2015-02-22 DIAGNOSIS — R05 Cough: Secondary | ICD-10-CM | POA: Diagnosis not present

## 2015-02-27 ENCOUNTER — Telehealth: Payer: Self-pay | Admitting: *Deleted

## 2015-02-27 ENCOUNTER — Other Ambulatory Visit: Payer: Self-pay

## 2015-02-27 DIAGNOSIS — C8280 Other types of follicular lymphoma, unspecified site: Secondary | ICD-10-CM

## 2015-02-27 MED ORDER — FENTANYL 12 MCG/HR TD PT72: 12.5000 ug | MEDICATED_PATCH | TRANSDERMAL | Status: DC

## 2015-02-27 NOTE — Telephone Encounter (Signed)
Let pt know Rx is ready for pickup.  Pt voiced understanding.

## 2015-02-27 NOTE — Telephone Encounter (Signed)
Patient called requesting refill for Fentanyl 12 mg.  Return number when ready for pick up is [phone] home, or [phone].

## 2015-02-28 DIAGNOSIS — J209 Acute bronchitis, unspecified: Secondary | ICD-10-CM | POA: Diagnosis not present

## 2015-02-28 DIAGNOSIS — I10 Essential (primary) hypertension: Secondary | ICD-10-CM | POA: Diagnosis not present

## 2015-02-28 DIAGNOSIS — Z6821 Body mass index (BMI) 21.0-21.9, adult: Secondary | ICD-10-CM | POA: Diagnosis not present

## 2015-02-28 DIAGNOSIS — G4700 Insomnia, unspecified: Secondary | ICD-10-CM | POA: Diagnosis not present

## 2015-02-28 DIAGNOSIS — N183 Chronic kidney disease, stage 3 (moderate): Secondary | ICD-10-CM | POA: Diagnosis not present

## 2015-03-01 ENCOUNTER — Ambulatory Visit (HOSPITAL_BASED_OUTPATIENT_CLINIC_OR_DEPARTMENT_OTHER): Payer: Medicare Other

## 2015-03-01 VITALS — BP 112/55 | HR 74 | Temp 97.6°F | Resp 18

## 2015-03-01 DIAGNOSIS — C8590 Non-Hodgkin lymphoma, unspecified, unspecified site: Secondary | ICD-10-CM

## 2015-03-01 DIAGNOSIS — Z452 Encounter for adjustment and management of vascular access device: Secondary | ICD-10-CM | POA: Diagnosis present

## 2015-03-01 DIAGNOSIS — Z95828 Presence of other vascular implants and grafts: Secondary | ICD-10-CM

## 2015-03-01 MED ORDER — SODIUM CHLORIDE 0.9 % IJ SOLN
10.0000 mL | INTRAMUSCULAR | Status: DC | PRN
  Filled 2015-03-01: qty 10

## 2015-03-01 MED ORDER — HEPARIN SOD (PORK) LOCK FLUSH 100 UNIT/ML IV SOLN
500.0000 [IU] | Freq: Once | INTRAVENOUS | Status: AC
  Administered 2015-03-01: 500 [IU] via INTRAVENOUS
  Filled 2015-03-01: qty 5

## 2015-03-01 MED ORDER — SODIUM CHLORIDE 0.9 % IJ SOLN
10.0000 mL | INTRAMUSCULAR | Status: DC | PRN
  Administered 2015-03-01: 10 mL via INTRAVENOUS
  Filled 2015-03-01: qty 10

## 2015-03-01 MED ORDER — HEPARIN SOD (PORK) LOCK FLUSH 100 UNIT/ML IV SOLN
500.0000 [IU] | Freq: Once | INTRAVENOUS | Status: DC
  Filled 2015-03-01: qty 5

## 2015-03-13 ENCOUNTER — Ambulatory Visit (HOSPITAL_COMMUNITY)
Admission: RE | Admit: 2015-03-13 | Discharge: 2015-03-13 | Disposition: A | Discharge status: Home / Self Care | Payer: Medicare Other | Source: Ambulatory Visit | Attending: Internal Medicine | Admitting: Internal Medicine

## 2015-03-21 DIAGNOSIS — I10 Essential (primary) hypertension: Secondary | ICD-10-CM | POA: Diagnosis not present

## 2015-03-21 DIAGNOSIS — G459 Transient cerebral ischemic attack, unspecified: Secondary | ICD-10-CM | POA: Diagnosis not present

## 2015-03-21 DIAGNOSIS — R079 Chest pain, unspecified: Secondary | ICD-10-CM | POA: Diagnosis not present

## 2015-03-21 DIAGNOSIS — Z6822 Body mass index (BMI) 22.0-22.9, adult: Secondary | ICD-10-CM | POA: Diagnosis not present

## 2015-03-27 DIAGNOSIS — I10 Essential (primary) hypertension: Secondary | ICD-10-CM | POA: Diagnosis not present

## 2015-03-29 ENCOUNTER — Telehealth: Payer: Self-pay | Admitting: *Deleted

## 2015-03-29 ENCOUNTER — Other Ambulatory Visit: Payer: Self-pay

## 2015-03-29 DIAGNOSIS — C8280 Other types of follicular lymphoma, unspecified site: Secondary | ICD-10-CM

## 2015-03-29 MED ORDER — FENTANYL 12 MCG/HR TD PT72: 12.5000 ug | MEDICATED_PATCH | TRANSDERMAL | Status: DC

## 2015-03-29 NOTE — Telephone Encounter (Signed)
  Patient called requesting refill for Fentanyl 12 mg. Return number when ready for pick up is [phone] home, or [phone].

## 2015-03-30 MED FILL — fentaNYL 12 MCG/HR PT72: 12 | 30 days supply | Qty: 10 | Fill #0

## 2015-04-04 DIAGNOSIS — I10 Essential (primary) hypertension: Secondary | ICD-10-CM | POA: Diagnosis not present

## 2015-04-04 DIAGNOSIS — R079 Chest pain, unspecified: Secondary | ICD-10-CM | POA: Diagnosis not present

## 2015-04-11 ENCOUNTER — Telehealth: Payer: Self-pay | Admitting: *Deleted

## 2015-04-11 NOTE — Telephone Encounter (Signed)
"  I'm calling today to let you all know I'll be with my husband tonight for a sleep study in [place] at 11:00 pm.  Tomorrow I hope he's discharged in time for me to make my appointment for flush but want you all to know I will come in for flush.  Offered to change time.  "I do not want to change the time, I just may be a little late."  Protocol when awake is to get dressed and discharged home until results evaluated by pulmonologist.

## 2015-04-12 ENCOUNTER — Ambulatory Visit (HOSPITAL_BASED_OUTPATIENT_CLINIC_OR_DEPARTMENT_OTHER): Payer: Medicare Other

## 2015-04-12 VITALS — BP 128/68 | HR 64 | Temp 98.0°F | Resp 17

## 2015-04-12 DIAGNOSIS — C8590 Non-Hodgkin lymphoma, unspecified, unspecified site: Secondary | ICD-10-CM

## 2015-04-12 DIAGNOSIS — Z452 Encounter for adjustment and management of vascular access device: Secondary | ICD-10-CM | POA: Diagnosis present

## 2015-04-12 DIAGNOSIS — Z95828 Presence of other vascular implants and grafts: Secondary | ICD-10-CM

## 2015-04-12 MED ORDER — SODIUM CHLORIDE 0.9 % IJ SOLN
10.0000 mL | INTRAMUSCULAR | Status: DC | PRN
  Administered 2015-04-12: 10 mL via INTRAVENOUS
  Filled 2015-04-12: qty 10

## 2015-04-12 MED ORDER — HEPARIN SOD (PORK) LOCK FLUSH 100 UNIT/ML IV SOLN
500.0000 [IU] | Freq: Once | INTRAVENOUS | Status: AC
  Administered 2015-04-12: 500 [IU] via INTRAVENOUS
  Filled 2015-04-12: qty 5

## 2015-04-12 NOTE — Patient Instructions (Signed)

## 2015-04-27 ENCOUNTER — Telehealth: Payer: Self-pay | Admitting: *Deleted

## 2015-04-27 ENCOUNTER — Other Ambulatory Visit: Payer: Self-pay

## 2015-04-27 DIAGNOSIS — C8280 Other types of follicular lymphoma, unspecified site: Secondary | ICD-10-CM

## 2015-04-27 MED ORDER — FENTANYL 12 MCG/HR TD PT72: 12.5000 ug | MEDICATED_PATCH | TRANSDERMAL | Status: DC

## 2015-04-27 NOTE — Telephone Encounter (Signed)
Patient called requesting "refill for Fentanyl 12 mcg.  Due to change patch Monday night. Will pick up Monday.  Return number if any questions mobile: [phone] or home: [phone].  Will come in sometime Monday."    Collaborative notified.

## 2015-04-30 MED FILL — fentaNYL 12 MCG/HR PT72: 12 | 30 days supply | Qty: 10 | Fill #0

## 2015-05-29 ENCOUNTER — Other Ambulatory Visit: Payer: Self-pay | Admitting: *Deleted

## 2015-05-29 ENCOUNTER — Telehealth: Payer: Self-pay

## 2015-05-29 DIAGNOSIS — C8280 Other types of follicular lymphoma, unspecified site: Secondary | ICD-10-CM

## 2015-05-29 MED ORDER — FENTANYL 12 MCG/HR TD PT72: 12.5000 ug | MEDICATED_PATCH | TRANSDERMAL | Status: DC

## 2015-05-29 NOTE — Telephone Encounter (Signed)
Patient requesting a refill on her fentanyl patch.  [name] would like someone to call her when the medication is ready to be picked up, she states that the phone might be busy because her husband just passed away.  If someone is unable to get through she will call tomorrow to see if it is ready.

## 2015-05-29 NOTE — Telephone Encounter (Signed)
Left VMM that prescription is ready for pick up.

## 2015-05-30 ENCOUNTER — Telehealth: Payer: Self-pay | Admitting: *Deleted

## 2015-05-30 NOTE — Telephone Encounter (Signed)
Fentanyl prescription is ready for pick up.

## 2015-05-30 NOTE — Telephone Encounter (Signed)
VM message from patient inquiring about her Fenrtanyl prescription. It is available for patient to pick up.  VM left for patient on her cell phone informing her that it is ready for her.

## 2015-06-01 MED FILL — fentaNYL 12 MCG/HR PT72: 12 | 30 days supply | Qty: 10 | Fill #0

## 2015-06-19 ENCOUNTER — Other Ambulatory Visit: Payer: Self-pay

## 2015-06-19 ENCOUNTER — Telehealth: Payer: Self-pay | Admitting: *Deleted

## 2015-06-19 DIAGNOSIS — C8280 Other types of follicular lymphoma, unspecified site: Secondary | ICD-10-CM

## 2015-06-19 MED ORDER — FENTANYL 12 MCG/HR TD PT72: 12.5000 ug | MEDICATED_PATCH | TRANSDERMAL | Status: DC

## 2015-06-19 NOTE — Telephone Encounter (Signed)
Patient called needing a refill of fentanyl patch. Patient is aware she is not due until next week, but she recently moved to [place] (after husband death) and can not find the rest of the patches. Patient will be available to get this refill tomorrow if possible. Message sent to RN Terri.

## 2015-06-20 MED FILL — fentaNYL 12 MCG/HR PT72: 12 | 30 days supply | Qty: 10 | Fill #0

## 2015-07-05 ENCOUNTER — Telehealth: Payer: Self-pay | Admitting: Hematology and Oncology

## 2015-07-05 ENCOUNTER — Ambulatory Visit (HOSPITAL_BASED_OUTPATIENT_CLINIC_OR_DEPARTMENT_OTHER): Payer: Medicare Other

## 2015-07-05 DIAGNOSIS — C8590 Non-Hodgkin lymphoma, unspecified, unspecified site: Secondary | ICD-10-CM | POA: Diagnosis not present

## 2015-07-05 DIAGNOSIS — Z95828 Presence of other vascular implants and grafts: Secondary | ICD-10-CM

## 2015-07-05 DIAGNOSIS — Z452 Encounter for adjustment and management of vascular access device: Secondary | ICD-10-CM

## 2015-07-05 MED ORDER — SODIUM CHLORIDE 0.9% FLUSH
10.0000 mL | INTRAVENOUS | Status: DC | PRN
  Administered 2015-07-05: 10 mL via INTRAVENOUS
  Filled 2015-07-05: qty 10

## 2015-07-05 MED ORDER — HEPARIN SOD (PORK) LOCK FLUSH 100 UNIT/ML IV SOLN
500.0000 [IU] | Freq: Once | INTRAVENOUS | Status: AC
  Administered 2015-07-05: 500 [IU] via INTRAVENOUS
  Filled 2015-07-05: qty 5

## 2015-07-05 NOTE — Telephone Encounter (Signed)
Lt mess for [name] to transfer care to Dr. Hinton Rao per pt's request.  Faxed records to [phone]. [phone] release id)

## 2015-07-05 NOTE — Patient Instructions (Signed)

## 2015-07-19 DIAGNOSIS — C8518 Unspecified B-cell lymphoma, lymph nodes of multiple sites: Secondary | ICD-10-CM | POA: Diagnosis not present

## 2015-07-19 DIAGNOSIS — C8200 Follicular lymphoma grade I, unspecified site: Secondary | ICD-10-CM | POA: Diagnosis not present

## 2015-07-25 DIAGNOSIS — I639 Cerebral infarction, unspecified: Secondary | ICD-10-CM | POA: Diagnosis not present

## 2015-07-25 DIAGNOSIS — I10 Essential (primary) hypertension: Secondary | ICD-10-CM | POA: Diagnosis not present

## 2015-07-25 DIAGNOSIS — M1990 Unspecified osteoarthritis, unspecified site: Secondary | ICD-10-CM | POA: Diagnosis not present

## 2015-07-25 DIAGNOSIS — K219 Gastro-esophageal reflux disease without esophagitis: Secondary | ICD-10-CM | POA: Diagnosis not present

## 2015-08-14 ENCOUNTER — Telehealth: Payer: Self-pay | Admitting: Hematology and Oncology

## 2015-08-14 NOTE — Telephone Encounter (Signed)
pt called to cancel all apts, pt now going to [facility]

## 2015-09-07 DIAGNOSIS — R5383 Other fatigue: Secondary | ICD-10-CM | POA: Diagnosis not present

## 2015-09-07 DIAGNOSIS — I10 Essential (primary) hypertension: Secondary | ICD-10-CM | POA: Diagnosis not present

## 2015-09-07 DIAGNOSIS — I639 Cerebral infarction, unspecified: Secondary | ICD-10-CM | POA: Diagnosis not present

## 2015-09-07 DIAGNOSIS — D519 Vitamin B12 deficiency anemia, unspecified: Secondary | ICD-10-CM | POA: Diagnosis not present

## 2015-09-07 DIAGNOSIS — Z0000 Encounter for general adult medical examination without abnormal findings: Secondary | ICD-10-CM | POA: Diagnosis not present

## 2015-09-07 DIAGNOSIS — Z79899 Other long term (current) drug therapy: Secondary | ICD-10-CM | POA: Diagnosis not present

## 2015-09-07 DIAGNOSIS — E785 Hyperlipidemia, unspecified: Secondary | ICD-10-CM | POA: Diagnosis not present

## 2015-09-07 DIAGNOSIS — E559 Vitamin D deficiency, unspecified: Secondary | ICD-10-CM | POA: Diagnosis not present

## 2015-09-12 DIAGNOSIS — I10 Essential (primary) hypertension: Secondary | ICD-10-CM | POA: Diagnosis not present

## 2015-09-12 DIAGNOSIS — N289 Disorder of kidney and ureter, unspecified: Secondary | ICD-10-CM | POA: Diagnosis not present

## 2015-09-12 DIAGNOSIS — E559 Vitamin D deficiency, unspecified: Secondary | ICD-10-CM | POA: Diagnosis not present

## 2015-09-12 DIAGNOSIS — E7800 Pure hypercholesterolemia, unspecified: Secondary | ICD-10-CM | POA: Diagnosis not present

## 2015-09-18 ENCOUNTER — Telehealth: Payer: Self-pay | Admitting: Hematology and Oncology

## 2015-09-18 NOTE — Telephone Encounter (Signed)
Mailed pt records to [facility]

## 2015-09-19 DIAGNOSIS — I160 Hypertensive urgency: Secondary | ICD-10-CM | POA: Diagnosis not present

## 2015-09-19 DIAGNOSIS — R42 Dizziness and giddiness: Secondary | ICD-10-CM | POA: Diagnosis not present

## 2015-09-19 DIAGNOSIS — I10 Essential (primary) hypertension: Secondary | ICD-10-CM | POA: Diagnosis not present

## 2015-09-19 DIAGNOSIS — Z79899 Other long term (current) drug therapy: Secondary | ICD-10-CM | POA: Diagnosis not present

## 2015-09-19 DIAGNOSIS — R030 Elevated blood-pressure reading, without diagnosis of hypertension: Secondary | ICD-10-CM | POA: Diagnosis not present

## 2015-09-26 DIAGNOSIS — I160 Hypertensive urgency: Secondary | ICD-10-CM | POA: Diagnosis not present

## 2015-09-26 DIAGNOSIS — I10 Essential (primary) hypertension: Secondary | ICD-10-CM | POA: Diagnosis not present

## 2015-10-08 DIAGNOSIS — H04123 Dry eye syndrome of bilateral lacrimal glands: Secondary | ICD-10-CM | POA: Diagnosis not present

## 2015-10-08 DIAGNOSIS — H52203 Unspecified astigmatism, bilateral: Secondary | ICD-10-CM | POA: Diagnosis not present

## 2015-10-08 DIAGNOSIS — H35371 Puckering of macula, right eye: Secondary | ICD-10-CM | POA: Diagnosis not present

## 2015-10-08 DIAGNOSIS — H01001 Unspecified blepharitis right upper eyelid: Secondary | ICD-10-CM | POA: Diagnosis not present

## 2015-10-10 DIAGNOSIS — I160 Hypertensive urgency: Secondary | ICD-10-CM | POA: Diagnosis not present

## 2015-10-10 DIAGNOSIS — T783XXA Angioneurotic edema, initial encounter: Secondary | ICD-10-CM | POA: Diagnosis not present

## 2015-10-10 DIAGNOSIS — R001 Bradycardia, unspecified: Secondary | ICD-10-CM | POA: Diagnosis not present

## 2015-10-10 DIAGNOSIS — R030 Elevated blood-pressure reading, without diagnosis of hypertension: Secondary | ICD-10-CM | POA: Diagnosis not present

## 2015-10-10 DIAGNOSIS — I10 Essential (primary) hypertension: Secondary | ICD-10-CM | POA: Diagnosis not present

## 2015-10-10 DIAGNOSIS — T464X5A Adverse effect of angiotensin-converting-enzyme inhibitors, initial encounter: Secondary | ICD-10-CM | POA: Diagnosis not present

## 2015-10-10 DIAGNOSIS — R609 Edema, unspecified: Secondary | ICD-10-CM | POA: Diagnosis not present

## 2015-10-11 DIAGNOSIS — R634 Abnormal weight loss: Secondary | ICD-10-CM | POA: Diagnosis not present

## 2015-10-11 DIAGNOSIS — R630 Anorexia: Secondary | ICD-10-CM | POA: Diagnosis not present

## 2015-10-11 DIAGNOSIS — I10 Essential (primary) hypertension: Secondary | ICD-10-CM | POA: Diagnosis not present

## 2015-10-11 DIAGNOSIS — M810 Age-related osteoporosis without current pathological fracture: Secondary | ICD-10-CM | POA: Diagnosis not present

## 2015-10-18 ENCOUNTER — Ambulatory Visit: Payer: Self-pay | Admitting: Allergy and Immunology

## 2015-10-18 DIAGNOSIS — C8208 Follicular lymphoma grade I, lymph nodes of multiple sites: Secondary | ICD-10-CM

## 2015-10-24 DIAGNOSIS — Z1231 Encounter for screening mammogram for malignant neoplasm of breast: Secondary | ICD-10-CM | POA: Diagnosis not present

## 2015-11-27 DIAGNOSIS — R1011 Right upper quadrant pain: Secondary | ICD-10-CM | POA: Diagnosis not present

## 2015-11-27 DIAGNOSIS — R300 Dysuria: Secondary | ICD-10-CM | POA: Diagnosis not present

## 2015-11-27 DIAGNOSIS — C8208 Follicular lymphoma grade I, lymph nodes of multiple sites: Secondary | ICD-10-CM | POA: Diagnosis not present

## 2015-11-28 DIAGNOSIS — R1084 Generalized abdominal pain: Secondary | ICD-10-CM | POA: Diagnosis not present

## 2015-11-28 DIAGNOSIS — C8290 Follicular lymphoma, unspecified, unspecified site: Secondary | ICD-10-CM | POA: Diagnosis not present

## 2015-11-28 DIAGNOSIS — C8208 Follicular lymphoma grade I, lymph nodes of multiple sites: Secondary | ICD-10-CM | POA: Diagnosis not present

## 2015-11-29 DIAGNOSIS — C8208 Follicular lymphoma grade I, lymph nodes of multiple sites: Secondary | ICD-10-CM | POA: Diagnosis not present

## 2015-11-29 DIAGNOSIS — R1011 Right upper quadrant pain: Secondary | ICD-10-CM | POA: Diagnosis not present

## 2015-12-04 DIAGNOSIS — S22070A Wedge compression fracture of T9-T10 vertebra, initial encounter for closed fracture: Secondary | ICD-10-CM | POA: Diagnosis not present

## 2015-12-04 DIAGNOSIS — M5136 Other intervertebral disc degeneration, lumbar region: Secondary | ICD-10-CM | POA: Diagnosis not present

## 2015-12-07 DIAGNOSIS — M1990 Unspecified osteoarthritis, unspecified site: Secondary | ICD-10-CM | POA: Diagnosis not present

## 2015-12-07 DIAGNOSIS — R1084 Generalized abdominal pain: Secondary | ICD-10-CM | POA: Diagnosis not present

## 2015-12-07 DIAGNOSIS — I10 Essential (primary) hypertension: Secondary | ICD-10-CM | POA: Diagnosis not present

## 2015-12-07 DIAGNOSIS — Z0000 Encounter for general adult medical examination without abnormal findings: Secondary | ICD-10-CM | POA: Diagnosis not present

## 2015-12-07 DIAGNOSIS — Z9181 History of falling: Secondary | ICD-10-CM | POA: Diagnosis not present

## 2015-12-11 DIAGNOSIS — R1013 Epigastric pain: Secondary | ICD-10-CM | POA: Diagnosis not present

## 2015-12-11 DIAGNOSIS — K811 Chronic cholecystitis: Secondary | ICD-10-CM | POA: Diagnosis not present

## 2015-12-11 DIAGNOSIS — R1011 Right upper quadrant pain: Secondary | ICD-10-CM | POA: Diagnosis not present

## 2015-12-11 DIAGNOSIS — R110 Nausea: Secondary | ICD-10-CM | POA: Diagnosis not present

## 2015-12-17 DIAGNOSIS — Z01818 Encounter for other preprocedural examination: Secondary | ICD-10-CM | POA: Diagnosis not present

## 2015-12-19 DIAGNOSIS — K819 Cholecystitis, unspecified: Secondary | ICD-10-CM | POA: Diagnosis not present

## 2015-12-19 DIAGNOSIS — K811 Chronic cholecystitis: Secondary | ICD-10-CM | POA: Diagnosis not present

## 2015-12-19 DIAGNOSIS — E785 Hyperlipidemia, unspecified: Secondary | ICD-10-CM | POA: Diagnosis not present

## 2015-12-19 DIAGNOSIS — N189 Chronic kidney disease, unspecified: Secondary | ICD-10-CM | POA: Diagnosis not present

## 2015-12-19 DIAGNOSIS — Z7902 Long term (current) use of antithrombotics/antiplatelets: Secondary | ICD-10-CM | POA: Diagnosis not present

## 2015-12-19 DIAGNOSIS — C8590 Non-Hodgkin lymphoma, unspecified, unspecified site: Secondary | ICD-10-CM | POA: Diagnosis not present

## 2015-12-19 DIAGNOSIS — Z8673 Personal history of transient ischemic attack (TIA), and cerebral infarction without residual deficits: Secondary | ICD-10-CM | POA: Diagnosis not present

## 2015-12-19 DIAGNOSIS — R1011 Right upper quadrant pain: Secondary | ICD-10-CM | POA: Diagnosis not present

## 2015-12-19 DIAGNOSIS — I129 Hypertensive chronic kidney disease with stage 1 through stage 4 chronic kidney disease, or unspecified chronic kidney disease: Secondary | ICD-10-CM | POA: Diagnosis not present

## 2015-12-19 DIAGNOSIS — K828 Other specified diseases of gallbladder: Secondary | ICD-10-CM | POA: Diagnosis not present

## 2015-12-19 DIAGNOSIS — Z79899 Other long term (current) drug therapy: Secondary | ICD-10-CM | POA: Diagnosis not present

## 2015-12-20 ENCOUNTER — Ambulatory Visit: Payer: Medicare Other | Admitting: Hematology and Oncology

## 2015-12-20 ENCOUNTER — Other Ambulatory Visit: Payer: Medicare Other

## 2015-12-20 DIAGNOSIS — E785 Hyperlipidemia, unspecified: Secondary | ICD-10-CM | POA: Diagnosis not present

## 2015-12-20 DIAGNOSIS — Z79899 Other long term (current) drug therapy: Secondary | ICD-10-CM | POA: Diagnosis not present

## 2015-12-20 DIAGNOSIS — I129 Hypertensive chronic kidney disease with stage 1 through stage 4 chronic kidney disease, or unspecified chronic kidney disease: Secondary | ICD-10-CM | POA: Diagnosis not present

## 2015-12-20 DIAGNOSIS — N189 Chronic kidney disease, unspecified: Secondary | ICD-10-CM | POA: Diagnosis not present

## 2015-12-20 DIAGNOSIS — C8590 Non-Hodgkin lymphoma, unspecified, unspecified site: Secondary | ICD-10-CM | POA: Diagnosis not present

## 2015-12-20 DIAGNOSIS — K811 Chronic cholecystitis: Secondary | ICD-10-CM | POA: Diagnosis not present

## 2016-01-10 DIAGNOSIS — M8008XA Age-related osteoporosis with current pathological fracture, vertebra(e), initial encounter for fracture: Secondary | ICD-10-CM | POA: Diagnosis not present

## 2016-01-10 DIAGNOSIS — M549 Dorsalgia, unspecified: Secondary | ICD-10-CM | POA: Diagnosis not present

## 2016-01-18 DIAGNOSIS — R1084 Generalized abdominal pain: Secondary | ICD-10-CM | POA: Diagnosis not present

## 2016-01-22 DIAGNOSIS — Z6820 Body mass index (BMI) 20.0-20.9, adult: Secondary | ICD-10-CM | POA: Diagnosis not present

## 2016-01-22 DIAGNOSIS — I10 Essential (primary) hypertension: Secondary | ICD-10-CM | POA: Diagnosis not present

## 2016-01-22 DIAGNOSIS — I5021 Acute systolic (congestive) heart failure: Secondary | ICD-10-CM | POA: Diagnosis not present

## 2016-01-22 DIAGNOSIS — Z01818 Encounter for other preprocedural examination: Secondary | ICD-10-CM | POA: Diagnosis not present

## 2016-01-22 DIAGNOSIS — Z9049 Acquired absence of other specified parts of digestive tract: Secondary | ICD-10-CM | POA: Diagnosis not present

## 2016-01-22 DIAGNOSIS — G459 Transient cerebral ischemic attack, unspecified: Secondary | ICD-10-CM | POA: Diagnosis not present

## 2016-01-22 DIAGNOSIS — C8290 Follicular lymphoma, unspecified, unspecified site: Secondary | ICD-10-CM | POA: Diagnosis not present

## 2016-01-22 DIAGNOSIS — Z90710 Acquired absence of both cervix and uterus: Secondary | ICD-10-CM | POA: Diagnosis not present

## 2016-01-22 DIAGNOSIS — I110 Hypertensive heart disease with heart failure: Secondary | ICD-10-CM | POA: Diagnosis not present

## 2016-01-22 DIAGNOSIS — Z9889 Other specified postprocedural states: Secondary | ICD-10-CM | POA: Diagnosis not present

## 2016-01-22 DIAGNOSIS — Z79899 Other long term (current) drug therapy: Secondary | ICD-10-CM | POA: Diagnosis not present

## 2016-01-23 DIAGNOSIS — I10 Essential (primary) hypertension: Secondary | ICD-10-CM | POA: Diagnosis not present

## 2016-01-23 DIAGNOSIS — Z8673 Personal history of transient ischemic attack (TIA), and cerebral infarction without residual deficits: Secondary | ICD-10-CM | POA: Diagnosis not present

## 2016-01-23 DIAGNOSIS — M8008XA Age-related osteoporosis with current pathological fracture, vertebra(e), initial encounter for fracture: Secondary | ICD-10-CM | POA: Diagnosis not present

## 2016-01-23 DIAGNOSIS — Z462 Encounter for fitting and adjustment of other devices related to nervous system and special senses: Secondary | ICD-10-CM | POA: Diagnosis not present

## 2016-01-23 DIAGNOSIS — M8000XA Age-related osteoporosis with current pathological fracture, unspecified site, initial encounter for fracture: Secondary | ICD-10-CM | POA: Diagnosis not present

## 2016-01-23 DIAGNOSIS — Z7902 Long term (current) use of antithrombotics/antiplatelets: Secondary | ICD-10-CM | POA: Diagnosis not present

## 2016-01-23 DIAGNOSIS — E785 Hyperlipidemia, unspecified: Secondary | ICD-10-CM | POA: Diagnosis not present

## 2016-01-23 DIAGNOSIS — Z79899 Other long term (current) drug therapy: Secondary | ICD-10-CM | POA: Diagnosis not present

## 2016-02-19 DIAGNOSIS — M8008XA Age-related osteoporosis with current pathological fracture, vertebra(e), initial encounter for fracture: Secondary | ICD-10-CM | POA: Diagnosis not present

## 2016-02-19 DIAGNOSIS — Z6820 Body mass index (BMI) 20.0-20.9, adult: Secondary | ICD-10-CM | POA: Diagnosis not present

## 2016-02-19 DIAGNOSIS — M8008XD Age-related osteoporosis with current pathological fracture, vertebra(e), subsequent encounter for fracture with routine healing: Secondary | ICD-10-CM | POA: Diagnosis not present

## 2016-03-12 DIAGNOSIS — M899 Disorder of bone, unspecified: Secondary | ICD-10-CM | POA: Diagnosis not present

## 2016-03-12 DIAGNOSIS — M810 Age-related osteoporosis without current pathological fracture: Secondary | ICD-10-CM | POA: Diagnosis not present

## 2016-04-15 DIAGNOSIS — M810 Age-related osteoporosis without current pathological fracture: Secondary | ICD-10-CM | POA: Diagnosis not present

## 2016-04-15 DIAGNOSIS — Z79899 Other long term (current) drug therapy: Secondary | ICD-10-CM | POA: Diagnosis not present

## 2016-04-15 DIAGNOSIS — E559 Vitamin D deficiency, unspecified: Secondary | ICD-10-CM | POA: Diagnosis not present

## 2016-04-15 DIAGNOSIS — R1083 Colic: Secondary | ICD-10-CM | POA: Diagnosis not present

## 2016-04-15 DIAGNOSIS — I10 Essential (primary) hypertension: Secondary | ICD-10-CM | POA: Diagnosis not present

## 2016-04-15 DIAGNOSIS — Z13220 Encounter for screening for lipoid disorders: Secondary | ICD-10-CM | POA: Diagnosis not present

## 2016-04-15 DIAGNOSIS — R5383 Other fatigue: Secondary | ICD-10-CM | POA: Diagnosis not present

## 2016-04-15 DIAGNOSIS — M1990 Unspecified osteoarthritis, unspecified site: Secondary | ICD-10-CM | POA: Diagnosis not present

## 2016-04-22 DIAGNOSIS — Z1389 Encounter for screening for other disorder: Secondary | ICD-10-CM | POA: Diagnosis not present

## 2016-04-22 DIAGNOSIS — G8929 Other chronic pain: Secondary | ICD-10-CM | POA: Diagnosis not present

## 2016-04-22 DIAGNOSIS — R0789 Other chest pain: Secondary | ICD-10-CM | POA: Diagnosis not present

## 2016-04-22 DIAGNOSIS — M549 Dorsalgia, unspecified: Secondary | ICD-10-CM | POA: Diagnosis not present

## 2016-05-15 DIAGNOSIS — C8208 Follicular lymphoma grade I, lymph nodes of multiple sites: Secondary | ICD-10-CM | POA: Diagnosis not present

## 2016-05-15 DIAGNOSIS — M810 Age-related osteoporosis without current pathological fracture: Secondary | ICD-10-CM | POA: Diagnosis not present

## 2016-06-18 DIAGNOSIS — Z6820 Body mass index (BMI) 20.0-20.9, adult: Secondary | ICD-10-CM | POA: Diagnosis not present

## 2016-06-18 DIAGNOSIS — M810 Age-related osteoporosis without current pathological fracture: Secondary | ICD-10-CM | POA: Diagnosis not present

## 2016-06-18 DIAGNOSIS — Z1389 Encounter for screening for other disorder: Secondary | ICD-10-CM | POA: Diagnosis not present

## 2016-06-18 DIAGNOSIS — K5900 Constipation, unspecified: Secondary | ICD-10-CM | POA: Diagnosis not present

## 2016-07-04 DIAGNOSIS — H01004 Unspecified blepharitis left upper eyelid: Secondary | ICD-10-CM | POA: Diagnosis not present

## 2016-07-04 DIAGNOSIS — H04123 Dry eye syndrome of bilateral lacrimal glands: Secondary | ICD-10-CM | POA: Diagnosis not present

## 2016-07-04 DIAGNOSIS — H524 Presbyopia: Secondary | ICD-10-CM | POA: Diagnosis not present

## 2016-07-04 DIAGNOSIS — H01001 Unspecified blepharitis right upper eyelid: Secondary | ICD-10-CM | POA: Diagnosis not present

## 2016-09-01 DIAGNOSIS — Z8572 Personal history of non-Hodgkin lymphomas: Secondary | ICD-10-CM | POA: Diagnosis not present

## 2016-09-04 DIAGNOSIS — Z681 Body mass index (BMI) 19 or less, adult: Secondary | ICD-10-CM | POA: Diagnosis not present

## 2016-09-04 DIAGNOSIS — Z1389 Encounter for screening for other disorder: Secondary | ICD-10-CM | POA: Diagnosis not present

## 2016-09-04 DIAGNOSIS — R109 Unspecified abdominal pain: Secondary | ICD-10-CM | POA: Diagnosis not present

## 2016-09-18 DIAGNOSIS — R1084 Generalized abdominal pain: Secondary | ICD-10-CM | POA: Diagnosis not present

## 2016-10-13 DIAGNOSIS — H01004 Unspecified blepharitis left upper eyelid: Secondary | ICD-10-CM | POA: Diagnosis not present

## 2016-10-13 DIAGNOSIS — H01001 Unspecified blepharitis right upper eyelid: Secondary | ICD-10-CM | POA: Diagnosis not present

## 2016-10-13 DIAGNOSIS — H04123 Dry eye syndrome of bilateral lacrimal glands: Secondary | ICD-10-CM | POA: Diagnosis not present

## 2016-10-15 DIAGNOSIS — N189 Chronic kidney disease, unspecified: Secondary | ICD-10-CM | POA: Diagnosis not present

## 2016-10-15 DIAGNOSIS — R809 Proteinuria, unspecified: Secondary | ICD-10-CM | POA: Diagnosis not present

## 2016-10-15 DIAGNOSIS — N183 Chronic kidney disease, stage 3 (moderate): Secondary | ICD-10-CM | POA: Diagnosis not present

## 2016-10-15 DIAGNOSIS — I129 Hypertensive chronic kidney disease with stage 1 through stage 4 chronic kidney disease, or unspecified chronic kidney disease: Secondary | ICD-10-CM | POA: Diagnosis not present

## 2016-11-17 DIAGNOSIS — R1310 Dysphagia, unspecified: Secondary | ICD-10-CM | POA: Diagnosis not present

## 2016-11-17 DIAGNOSIS — R634 Abnormal weight loss: Secondary | ICD-10-CM | POA: Diagnosis not present

## 2016-11-17 DIAGNOSIS — R1084 Generalized abdominal pain: Secondary | ICD-10-CM | POA: Diagnosis not present

## 2016-12-02 DIAGNOSIS — L508 Other urticaria: Secondary | ICD-10-CM | POA: Diagnosis not present

## 2016-12-02 DIAGNOSIS — Z681 Body mass index (BMI) 19 or less, adult: Secondary | ICD-10-CM | POA: Diagnosis not present

## 2016-12-17 DIAGNOSIS — D510 Vitamin B12 deficiency anemia due to intrinsic factor deficiency: Secondary | ICD-10-CM | POA: Diagnosis not present

## 2016-12-17 DIAGNOSIS — Z0000 Encounter for general adult medical examination without abnormal findings: Secondary | ICD-10-CM | POA: Diagnosis not present

## 2016-12-17 DIAGNOSIS — Z681 Body mass index (BMI) 19 or less, adult: Secondary | ICD-10-CM | POA: Diagnosis not present

## 2016-12-17 DIAGNOSIS — I10 Essential (primary) hypertension: Secondary | ICD-10-CM | POA: Diagnosis not present

## 2016-12-17 DIAGNOSIS — Z23 Encounter for immunization: Secondary | ICD-10-CM | POA: Diagnosis not present

## 2016-12-17 DIAGNOSIS — M1990 Unspecified osteoarthritis, unspecified site: Secondary | ICD-10-CM | POA: Diagnosis not present

## 2016-12-22 DIAGNOSIS — D510 Vitamin B12 deficiency anemia due to intrinsic factor deficiency: Secondary | ICD-10-CM | POA: Diagnosis not present

## 2016-12-30 DIAGNOSIS — D510 Vitamin B12 deficiency anemia due to intrinsic factor deficiency: Secondary | ICD-10-CM | POA: Diagnosis not present

## 2017-01-13 DIAGNOSIS — D510 Vitamin B12 deficiency anemia due to intrinsic factor deficiency: Secondary | ICD-10-CM | POA: Diagnosis not present

## 2017-02-21 DIAGNOSIS — Z7902 Long term (current) use of antithrombotics/antiplatelets: Secondary | ICD-10-CM | POA: Diagnosis not present

## 2017-02-21 DIAGNOSIS — R109 Unspecified abdominal pain: Secondary | ICD-10-CM | POA: Diagnosis not present

## 2017-02-21 DIAGNOSIS — K922 Gastrointestinal hemorrhage, unspecified: Secondary | ICD-10-CM | POA: Diagnosis not present

## 2017-02-21 DIAGNOSIS — N3001 Acute cystitis with hematuria: Secondary | ICD-10-CM | POA: Diagnosis not present

## 2017-02-21 DIAGNOSIS — E860 Dehydration: Secondary | ICD-10-CM | POA: Diagnosis not present

## 2017-02-21 DIAGNOSIS — K625 Hemorrhage of anus and rectum: Secondary | ICD-10-CM | POA: Diagnosis not present

## 2017-02-25 DIAGNOSIS — Z8579 Personal history of other malignant neoplasms of lymphoid, hematopoietic and related tissues: Secondary | ICD-10-CM | POA: Diagnosis not present

## 2017-02-25 DIAGNOSIS — D510 Vitamin B12 deficiency anemia due to intrinsic factor deficiency: Secondary | ICD-10-CM | POA: Diagnosis not present

## 2017-02-25 DIAGNOSIS — R1310 Dysphagia, unspecified: Secondary | ICD-10-CM | POA: Diagnosis not present

## 2017-02-25 DIAGNOSIS — Z8572 Personal history of non-Hodgkin lymphomas: Secondary | ICD-10-CM | POA: Diagnosis not present

## 2017-03-25 DIAGNOSIS — D510 Vitamin B12 deficiency anemia due to intrinsic factor deficiency: Secondary | ICD-10-CM | POA: Diagnosis not present

## 2017-04-15 ENCOUNTER — Other Ambulatory Visit: Payer: Self-pay | Admitting: Gastroenterology

## 2017-04-15 DIAGNOSIS — R1319 Other dysphagia: Secondary | ICD-10-CM

## 2017-04-15 DIAGNOSIS — R1310 Dysphagia, unspecified: Secondary | ICD-10-CM | POA: Diagnosis not present

## 2017-04-15 DIAGNOSIS — R1084 Generalized abdominal pain: Secondary | ICD-10-CM | POA: Diagnosis not present

## 2017-04-15 DIAGNOSIS — R634 Abnormal weight loss: Secondary | ICD-10-CM | POA: Diagnosis not present

## 2017-04-27 ENCOUNTER — Ambulatory Visit
Admission: RE | Admit: 2017-04-27 | Discharge: 2017-04-27 | Disposition: A | Discharge status: Home / Self Care | Payer: Medicare Other | Source: Ambulatory Visit | Attending: Gastroenterology | Admitting: Gastroenterology

## 2017-04-27 DIAGNOSIS — R1319 Other dysphagia: Secondary | ICD-10-CM

## 2017-04-27 DIAGNOSIS — K449 Diaphragmatic hernia without obstruction or gangrene: Secondary | ICD-10-CM | POA: Diagnosis not present

## 2017-04-27 DIAGNOSIS — R1310 Dysphagia, unspecified: Secondary | ICD-10-CM

## 2017-05-04 ENCOUNTER — Ambulatory Visit
Admission: RE | Admit: 2017-05-04 | Discharge: 2017-05-04 | Disposition: A | Discharge status: Home / Self Care | Payer: Medicare Other | Source: Ambulatory Visit | Attending: Gastroenterology | Admitting: Gastroenterology

## 2017-05-04 DIAGNOSIS — R1084 Generalized abdominal pain: Secondary | ICD-10-CM | POA: Diagnosis not present

## 2017-05-16 DIAGNOSIS — I160 Hypertensive urgency: Secondary | ICD-10-CM | POA: Diagnosis not present

## 2017-05-20 DIAGNOSIS — R109 Unspecified abdominal pain: Secondary | ICD-10-CM | POA: Diagnosis not present

## 2017-05-20 DIAGNOSIS — I10 Essential (primary) hypertension: Secondary | ICD-10-CM | POA: Diagnosis not present

## 2017-05-20 DIAGNOSIS — G8929 Other chronic pain: Secondary | ICD-10-CM | POA: Diagnosis not present

## 2017-05-20 DIAGNOSIS — Z681 Body mass index (BMI) 19 or less, adult: Secondary | ICD-10-CM | POA: Diagnosis not present

## 2017-05-25 DIAGNOSIS — H5211 Myopia, right eye: Secondary | ICD-10-CM | POA: Diagnosis not present

## 2017-05-25 DIAGNOSIS — H04123 Dry eye syndrome of bilateral lacrimal glands: Secondary | ICD-10-CM | POA: Diagnosis not present

## 2017-05-25 DIAGNOSIS — H0100A Unspecified blepharitis right eye, upper and lower eyelids: Secondary | ICD-10-CM | POA: Diagnosis not present

## 2017-05-25 DIAGNOSIS — H35371 Puckering of macula, right eye: Secondary | ICD-10-CM | POA: Diagnosis not present

## 2017-06-01 DIAGNOSIS — D510 Vitamin B12 deficiency anemia due to intrinsic factor deficiency: Secondary | ICD-10-CM | POA: Diagnosis not present

## 2017-07-01 DIAGNOSIS — C8208 Follicular lymphoma grade I, lymph nodes of multiple sites: Secondary | ICD-10-CM | POA: Diagnosis not present

## 2017-07-01 DIAGNOSIS — Z8572 Personal history of non-Hodgkin lymphomas: Secondary | ICD-10-CM | POA: Diagnosis not present

## 2017-07-01 DIAGNOSIS — M810 Age-related osteoporosis without current pathological fracture: Secondary | ICD-10-CM | POA: Diagnosis not present

## 2017-07-15 DIAGNOSIS — D510 Vitamin B12 deficiency anemia due to intrinsic factor deficiency: Secondary | ICD-10-CM | POA: Diagnosis not present

## 2017-09-09 DIAGNOSIS — D510 Vitamin B12 deficiency anemia due to intrinsic factor deficiency: Secondary | ICD-10-CM | POA: Diagnosis not present

## 2017-09-22 DIAGNOSIS — Z681 Body mass index (BMI) 19 or less, adult: Secondary | ICD-10-CM | POA: Diagnosis not present

## 2017-09-22 DIAGNOSIS — I10 Essential (primary) hypertension: Secondary | ICD-10-CM | POA: Diagnosis not present

## 2017-09-22 DIAGNOSIS — R42 Dizziness and giddiness: Secondary | ICD-10-CM | POA: Diagnosis not present

## 2017-09-22 DIAGNOSIS — T7840XA Allergy, unspecified, initial encounter: Secondary | ICD-10-CM | POA: Diagnosis not present

## 2017-10-27 DIAGNOSIS — Z79899 Other long term (current) drug therapy: Secondary | ICD-10-CM | POA: Diagnosis not present

## 2017-10-27 DIAGNOSIS — R5383 Other fatigue: Secondary | ICD-10-CM | POA: Diagnosis not present

## 2017-10-27 DIAGNOSIS — E559 Vitamin D deficiency, unspecified: Secondary | ICD-10-CM | POA: Diagnosis not present

## 2017-10-27 DIAGNOSIS — R5381 Other malaise: Secondary | ICD-10-CM | POA: Diagnosis not present

## 2017-10-27 DIAGNOSIS — M1990 Unspecified osteoarthritis, unspecified site: Secondary | ICD-10-CM | POA: Diagnosis not present

## 2017-10-27 DIAGNOSIS — D510 Vitamin B12 deficiency anemia due to intrinsic factor deficiency: Secondary | ICD-10-CM | POA: Diagnosis not present

## 2017-10-27 DIAGNOSIS — M545 Low back pain: Secondary | ICD-10-CM | POA: Diagnosis not present

## 2017-10-27 DIAGNOSIS — Z681 Body mass index (BMI) 19 or less, adult: Secondary | ICD-10-CM | POA: Diagnosis not present

## 2017-10-29 DIAGNOSIS — N189 Chronic kidney disease, unspecified: Secondary | ICD-10-CM | POA: Diagnosis not present

## 2017-10-29 DIAGNOSIS — R109 Unspecified abdominal pain: Secondary | ICD-10-CM | POA: Diagnosis not present

## 2017-10-29 DIAGNOSIS — M549 Dorsalgia, unspecified: Secondary | ICD-10-CM | POA: Diagnosis not present

## 2017-10-29 DIAGNOSIS — R1013 Epigastric pain: Secondary | ICD-10-CM | POA: Diagnosis not present

## 2017-10-29 DIAGNOSIS — C8208 Follicular lymphoma grade I, lymph nodes of multiple sites: Secondary | ICD-10-CM | POA: Diagnosis not present

## 2017-10-29 DIAGNOSIS — D649 Anemia, unspecified: Secondary | ICD-10-CM | POA: Diagnosis not present

## 2017-11-20 DIAGNOSIS — I10 Essential (primary) hypertension: Secondary | ICD-10-CM | POA: Diagnosis not present

## 2017-11-20 DIAGNOSIS — R809 Proteinuria, unspecified: Secondary | ICD-10-CM | POA: Diagnosis not present

## 2017-11-20 DIAGNOSIS — I120 Hypertensive chronic kidney disease with stage 5 chronic kidney disease or end stage renal disease: Secondary | ICD-10-CM | POA: Diagnosis not present

## 2017-11-20 DIAGNOSIS — N183 Chronic kidney disease, stage 3 (moderate): Secondary | ICD-10-CM | POA: Diagnosis not present

## 2017-11-27 DIAGNOSIS — R1310 Dysphagia, unspecified: Secondary | ICD-10-CM | POA: Diagnosis not present

## 2017-11-27 DIAGNOSIS — R1084 Generalized abdominal pain: Secondary | ICD-10-CM | POA: Diagnosis not present

## 2017-11-27 DIAGNOSIS — R634 Abnormal weight loss: Secondary | ICD-10-CM | POA: Diagnosis not present

## 2017-11-30 DIAGNOSIS — R419 Unspecified symptoms and signs involving cognitive functions and awareness: Secondary | ICD-10-CM | POA: Diagnosis not present

## 2017-11-30 DIAGNOSIS — R109 Unspecified abdominal pain: Secondary | ICD-10-CM | POA: Diagnosis not present

## 2017-11-30 DIAGNOSIS — K219 Gastro-esophageal reflux disease without esophagitis: Secondary | ICD-10-CM | POA: Diagnosis not present

## 2017-11-30 DIAGNOSIS — I10 Essential (primary) hypertension: Secondary | ICD-10-CM | POA: Diagnosis not present

## 2017-11-30 DIAGNOSIS — G8929 Other chronic pain: Secondary | ICD-10-CM | POA: Diagnosis not present

## 2017-11-30 DIAGNOSIS — E7849 Other hyperlipidemia: Secondary | ICD-10-CM | POA: Diagnosis not present

## 2017-11-30 DIAGNOSIS — Z8579 Personal history of other malignant neoplasms of lymphoid, hematopoietic and related tissues: Secondary | ICD-10-CM | POA: Diagnosis not present

## 2017-12-02 DIAGNOSIS — N183 Chronic kidney disease, stage 3 (moderate): Secondary | ICD-10-CM | POA: Diagnosis not present

## 2017-12-02 DIAGNOSIS — I4891 Unspecified atrial fibrillation: Secondary | ICD-10-CM | POA: Diagnosis not present

## 2017-12-02 DIAGNOSIS — M545 Low back pain: Secondary | ICD-10-CM | POA: Diagnosis not present

## 2017-12-02 DIAGNOSIS — Z7901 Long term (current) use of anticoagulants: Secondary | ICD-10-CM | POA: Diagnosis not present

## 2017-12-02 DIAGNOSIS — I129 Hypertensive chronic kidney disease with stage 1 through stage 4 chronic kidney disease, or unspecified chronic kidney disease: Secondary | ICD-10-CM | POA: Diagnosis not present

## 2017-12-10 DIAGNOSIS — R419 Unspecified symptoms and signs involving cognitive functions and awareness: Secondary | ICD-10-CM | POA: Diagnosis not present

## 2017-12-10 DIAGNOSIS — R413 Other amnesia: Secondary | ICD-10-CM | POA: Diagnosis not present

## 2018-01-12 DIAGNOSIS — Z961 Presence of intraocular lens: Secondary | ICD-10-CM | POA: Diagnosis not present

## 2018-01-12 DIAGNOSIS — H35371 Puckering of macula, right eye: Secondary | ICD-10-CM | POA: Diagnosis not present

## 2018-01-13 DIAGNOSIS — Z23 Encounter for immunization: Secondary | ICD-10-CM | POA: Diagnosis not present

## 2018-01-22 DIAGNOSIS — E7849 Other hyperlipidemia: Secondary | ICD-10-CM | POA: Diagnosis not present

## 2018-01-22 DIAGNOSIS — Z8579 Personal history of other malignant neoplasms of lymphoid, hematopoietic and related tissues: Secondary | ICD-10-CM | POA: Diagnosis not present

## 2018-01-22 DIAGNOSIS — R419 Unspecified symptoms and signs involving cognitive functions and awareness: Secondary | ICD-10-CM | POA: Diagnosis not present

## 2018-01-22 DIAGNOSIS — I10 Essential (primary) hypertension: Secondary | ICD-10-CM | POA: Diagnosis not present

## 2018-03-08 DIAGNOSIS — Z681 Body mass index (BMI) 19 or less, adult: Secondary | ICD-10-CM | POA: Diagnosis not present

## 2018-03-08 DIAGNOSIS — I872 Venous insufficiency (chronic) (peripheral): Secondary | ICD-10-CM | POA: Diagnosis not present

## 2018-03-08 DIAGNOSIS — M810 Age-related osteoporosis without current pathological fracture: Secondary | ICD-10-CM | POA: Diagnosis not present

## 2018-03-08 DIAGNOSIS — D510 Vitamin B12 deficiency anemia due to intrinsic factor deficiency: Secondary | ICD-10-CM | POA: Diagnosis not present

## 2018-03-10 DIAGNOSIS — R634 Abnormal weight loss: Secondary | ICD-10-CM | POA: Diagnosis not present

## 2018-03-10 DIAGNOSIS — R630 Anorexia: Secondary | ICD-10-CM | POA: Diagnosis not present

## 2018-03-10 DIAGNOSIS — C8208 Follicular lymphoma grade I, lymph nodes of multiple sites: Secondary | ICD-10-CM | POA: Diagnosis not present

## 2018-03-10 DIAGNOSIS — M549 Dorsalgia, unspecified: Secondary | ICD-10-CM | POA: Diagnosis not present

## 2018-03-10 DIAGNOSIS — F5089 Other specified eating disorder: Secondary | ICD-10-CM | POA: Diagnosis not present

## 2018-03-10 DIAGNOSIS — N189 Chronic kidney disease, unspecified: Secondary | ICD-10-CM

## 2018-03-10 DIAGNOSIS — R109 Unspecified abdominal pain: Secondary | ICD-10-CM | POA: Diagnosis not present

## 2018-03-10 DIAGNOSIS — G8929 Other chronic pain: Secondary | ICD-10-CM | POA: Diagnosis not present

## 2018-04-02 DIAGNOSIS — E785 Hyperlipidemia, unspecified: Secondary | ICD-10-CM | POA: Diagnosis not present

## 2018-04-02 DIAGNOSIS — I129 Hypertensive chronic kidney disease with stage 1 through stage 4 chronic kidney disease, or unspecified chronic kidney disease: Secondary | ICD-10-CM | POA: Diagnosis not present

## 2018-04-02 DIAGNOSIS — G459 Transient cerebral ischemic attack, unspecified: Secondary | ICD-10-CM | POA: Diagnosis not present

## 2018-04-02 DIAGNOSIS — N183 Chronic kidney disease, stage 3 (moderate): Secondary | ICD-10-CM | POA: Diagnosis not present

## 2018-04-23 DIAGNOSIS — G2581 Restless legs syndrome: Secondary | ICD-10-CM | POA: Diagnosis not present

## 2018-04-23 DIAGNOSIS — R419 Unspecified symptoms and signs involving cognitive functions and awareness: Secondary | ICD-10-CM | POA: Diagnosis not present

## 2018-04-23 DIAGNOSIS — I10 Essential (primary) hypertension: Secondary | ICD-10-CM | POA: Diagnosis not present

## 2018-04-30 DIAGNOSIS — Z79899 Other long term (current) drug therapy: Secondary | ICD-10-CM | POA: Diagnosis not present

## 2018-04-30 DIAGNOSIS — Z8572 Personal history of non-Hodgkin lymphomas: Secondary | ICD-10-CM | POA: Diagnosis not present

## 2018-04-30 DIAGNOSIS — K219 Gastro-esophageal reflux disease without esophagitis: Secondary | ICD-10-CM | POA: Diagnosis not present

## 2018-04-30 DIAGNOSIS — R112 Nausea with vomiting, unspecified: Secondary | ICD-10-CM | POA: Diagnosis not present

## 2018-04-30 DIAGNOSIS — I959 Hypotension, unspecified: Secondary | ICD-10-CM | POA: Diagnosis not present

## 2018-04-30 DIAGNOSIS — I480 Paroxysmal atrial fibrillation: Secondary | ICD-10-CM | POA: Diagnosis not present

## 2018-04-30 DIAGNOSIS — Z7902 Long term (current) use of antithrombotics/antiplatelets: Secondary | ICD-10-CM | POA: Diagnosis not present

## 2018-04-30 DIAGNOSIS — R0902 Hypoxemia: Secondary | ICD-10-CM | POA: Diagnosis not present

## 2018-04-30 DIAGNOSIS — R05 Cough: Secondary | ICD-10-CM | POA: Diagnosis not present

## 2018-04-30 DIAGNOSIS — R109 Unspecified abdominal pain: Secondary | ICD-10-CM | POA: Diagnosis not present

## 2018-04-30 DIAGNOSIS — G8929 Other chronic pain: Secondary | ICD-10-CM | POA: Diagnosis not present

## 2018-04-30 DIAGNOSIS — R531 Weakness: Secondary | ICD-10-CM | POA: Diagnosis not present

## 2018-06-02 DIAGNOSIS — N183 Chronic kidney disease, stage 3 (moderate): Secondary | ICD-10-CM | POA: Diagnosis not present

## 2018-08-02 DIAGNOSIS — R419 Unspecified symptoms and signs involving cognitive functions and awareness: Secondary | ICD-10-CM | POA: Diagnosis not present

## 2018-09-13 DIAGNOSIS — G8929 Other chronic pain: Secondary | ICD-10-CM | POA: Diagnosis not present

## 2018-09-13 DIAGNOSIS — N189 Chronic kidney disease, unspecified: Secondary | ICD-10-CM | POA: Diagnosis not present

## 2018-09-13 DIAGNOSIS — R109 Unspecified abdominal pain: Secondary | ICD-10-CM | POA: Diagnosis not present

## 2018-09-13 DIAGNOSIS — M549 Dorsalgia, unspecified: Secondary | ICD-10-CM | POA: Diagnosis not present

## 2018-09-13 DIAGNOSIS — C8208 Follicular lymphoma grade I, lymph nodes of multiple sites: Secondary | ICD-10-CM | POA: Diagnosis not present

## 2018-10-21 ENCOUNTER — Other Ambulatory Visit: Payer: Self-pay

## 2018-11-23 DIAGNOSIS — M810 Age-related osteoporosis without current pathological fracture: Secondary | ICD-10-CM | POA: Diagnosis not present

## 2018-11-23 DIAGNOSIS — Z9181 History of falling: Secondary | ICD-10-CM | POA: Diagnosis not present

## 2018-11-23 DIAGNOSIS — E785 Hyperlipidemia, unspecified: Secondary | ICD-10-CM | POA: Diagnosis not present

## 2018-11-23 DIAGNOSIS — M1990 Unspecified osteoarthritis, unspecified site: Secondary | ICD-10-CM | POA: Diagnosis not present

## 2018-11-23 DIAGNOSIS — Z6822 Body mass index (BMI) 22.0-22.9, adult: Secondary | ICD-10-CM | POA: Diagnosis not present

## 2018-11-23 DIAGNOSIS — Z79899 Other long term (current) drug therapy: Secondary | ICD-10-CM | POA: Diagnosis not present

## 2018-11-23 DIAGNOSIS — Z1331 Encounter for screening for depression: Secondary | ICD-10-CM | POA: Diagnosis not present

## 2018-11-23 DIAGNOSIS — E559 Vitamin D deficiency, unspecified: Secondary | ICD-10-CM | POA: Diagnosis not present

## 2018-11-23 DIAGNOSIS — Z0000 Encounter for general adult medical examination without abnormal findings: Secondary | ICD-10-CM | POA: Diagnosis not present

## 2018-11-24 DIAGNOSIS — R4182 Altered mental status, unspecified: Secondary | ICD-10-CM | POA: Diagnosis not present

## 2018-12-20 DIAGNOSIS — M810 Age-related osteoporosis without current pathological fracture: Secondary | ICD-10-CM | POA: Diagnosis not present

## 2019-03-10 DIAGNOSIS — C8208 Follicular lymphoma grade I, lymph nodes of multiple sites: Secondary | ICD-10-CM | POA: Diagnosis not present

## 2019-06-10 DIAGNOSIS — N183 Chronic kidney disease, stage 3 unspecified: Secondary | ICD-10-CM | POA: Diagnosis not present

## 2019-06-10 DIAGNOSIS — E785 Hyperlipidemia, unspecified: Secondary | ICD-10-CM | POA: Diagnosis not present

## 2019-06-10 DIAGNOSIS — I129 Hypertensive chronic kidney disease with stage 1 through stage 4 chronic kidney disease, or unspecified chronic kidney disease: Secondary | ICD-10-CM | POA: Diagnosis not present

## 2019-06-10 DIAGNOSIS — Z8673 Personal history of transient ischemic attack (TIA), and cerebral infarction without residual deficits: Secondary | ICD-10-CM | POA: Diagnosis not present

## 2019-07-22 DIAGNOSIS — Z6824 Body mass index (BMI) 24.0-24.9, adult: Secondary | ICD-10-CM | POA: Diagnosis not present

## 2019-07-22 DIAGNOSIS — M1990 Unspecified osteoarthritis, unspecified site: Secondary | ICD-10-CM | POA: Diagnosis not present

## 2019-07-22 DIAGNOSIS — M25562 Pain in left knee: Secondary | ICD-10-CM | POA: Diagnosis not present

## 2019-07-22 DIAGNOSIS — M810 Age-related osteoporosis without current pathological fracture: Secondary | ICD-10-CM | POA: Diagnosis not present

## 2019-07-26 DIAGNOSIS — M1712 Unilateral primary osteoarthritis, left knee: Secondary | ICD-10-CM | POA: Diagnosis not present

## 2019-07-26 DIAGNOSIS — M25562 Pain in left knee: Secondary | ICD-10-CM | POA: Diagnosis not present

## 2019-09-08 DIAGNOSIS — C8208 Follicular lymphoma grade I, lymph nodes of multiple sites: Secondary | ICD-10-CM | POA: Diagnosis not present

## 2019-09-08 DIAGNOSIS — M810 Age-related osteoporosis without current pathological fracture: Secondary | ICD-10-CM | POA: Diagnosis not present

## 2019-09-08 DIAGNOSIS — D649 Anemia, unspecified: Secondary | ICD-10-CM | POA: Diagnosis not present

## 2019-10-13 DIAGNOSIS — H35373 Puckering of macula, bilateral: Secondary | ICD-10-CM | POA: Diagnosis not present

## 2019-12-09 DIAGNOSIS — Z0000 Encounter for general adult medical examination without abnormal findings: Secondary | ICD-10-CM | POA: Diagnosis not present

## 2019-12-09 DIAGNOSIS — Z23 Encounter for immunization: Secondary | ICD-10-CM | POA: Diagnosis not present

## 2019-12-09 DIAGNOSIS — D229 Melanocytic nevi, unspecified: Secondary | ICD-10-CM | POA: Diagnosis not present

## 2019-12-09 DIAGNOSIS — Z6824 Body mass index (BMI) 24.0-24.9, adult: Secondary | ICD-10-CM | POA: Diagnosis not present

## 2019-12-09 DIAGNOSIS — Z1331 Encounter for screening for depression: Secondary | ICD-10-CM | POA: Diagnosis not present

## 2019-12-09 DIAGNOSIS — R32 Unspecified urinary incontinence: Secondary | ICD-10-CM | POA: Diagnosis not present

## 2019-12-09 DIAGNOSIS — M1990 Unspecified osteoarthritis, unspecified site: Secondary | ICD-10-CM | POA: Diagnosis not present

## 2020-01-02 ENCOUNTER — Ambulatory Visit: Payer: Medicare Other | Attending: Internal Medicine

## 2020-01-02 ENCOUNTER — Other Ambulatory Visit (HOSPITAL_BASED_OUTPATIENT_CLINIC_OR_DEPARTMENT_OTHER): Payer: Self-pay | Admitting: Internal Medicine

## 2020-01-02 DIAGNOSIS — Z23 Encounter for immunization: Secondary | ICD-10-CM

## 2020-01-02 MED FILL — PFIZER-BIONTECH COVID-19 VA: 30 | 1 days supply | Qty: 0 | Fill #0

## 2020-01-02 NOTE — Progress Notes (Signed)
   Covid-19 Vaccination Clinic  Name:  [name]    MRN: [id number] DOB: 12/27/1931  01/02/2020  [name] was observed post Covid-19 immunization for 15 minutes without incident. She was provided with Vaccine Information Sheet and instruction to access the V-Safe system. Vaccinated by Harriet Pho.   [name] was instructed to call 911 with any severe reactions post vaccine: [name] Difficulty breathing  . Swelling of face and throat  . A fast heartbeat  . A bad rash all over body  . Dizziness and weakness

## 2020-01-19 DIAGNOSIS — L814 Other melanin hyperpigmentation: Secondary | ICD-10-CM | POA: Diagnosis not present

## 2020-01-19 DIAGNOSIS — D229 Melanocytic nevi, unspecified: Secondary | ICD-10-CM | POA: Diagnosis not present

## 2020-01-19 DIAGNOSIS — L821 Other seborrheic keratosis: Secondary | ICD-10-CM | POA: Diagnosis not present

## 2020-01-19 DIAGNOSIS — D1801 Hemangioma of skin and subcutaneous tissue: Secondary | ICD-10-CM | POA: Diagnosis not present

## 2020-02-14 ENCOUNTER — Other Ambulatory Visit: Payer: Self-pay | Admitting: Hematology and Oncology

## 2020-02-14 DIAGNOSIS — C8280 Other types of follicular lymphoma, unspecified site: Secondary | ICD-10-CM

## 2020-02-14 DIAGNOSIS — M810 Age-related osteoporosis without current pathological fracture: Secondary | ICD-10-CM | POA: Diagnosis not present

## 2020-02-27 NOTE — Progress Notes (Signed)
[facility]  [address] [place],  [place]  [address] [phone]  Clinic Day:  02/28/2020  Referring physician: [facility]   CHIEF COMPLAINT:  CC: An 86- year old female with a history of stage III B-cell lymphoma, low-grade follicular type diagnosed in April 2013  Current Treatment:  Observation   HISTORY OF PRESENT ILLNESS:  [name] is a 86 y.o. female with a history of stage III B-cell lymphoma, low-grade follicular type diagnosed in April 2013. We began seeing in April 2017 when she transferred her care here.  Initial PET scan revealed adenopathy of the axillary and subpectoral areas, as well as retroperitoneal and pelvic adenopathy.  There were liver lesions seen, which were not hypermetabolic.  She was treated with CVP/Rituxan from June 2013 to September 2013 and had a near complete response.   She then developed a bowel perforation and had a prolonged hospitalization for a perforated diverticulum with peritonitis and sepsis.  She even required ventilator support and tracheostomy, but eventually recovered.  She has had no further therapy for her lymphoma.  She has not had evidence of progressive disease.  Unfortunately, she has had chronic abdominal pain since her bowel surgery, felt to be most likely secondary to adhesions.  She also has compression fractures at T9 and T10 and has had kyphoplasty to these areas.  She has multiple comorbidities including hypertension, TIA's, osteoarthritis, osteoporosis, and prior atrial fibrillation.  She intermittently seeks medical care for increases in her abdominal pain.  The most recent CT abdomen and pelvis in February did not reveal any progressive lymphoma.  She has chronically been on fentanyl 12 mcg per hour.  Her appetite remained poor, so she was placed on Megace 400 mg daily, which helped her appetite somewhat.  She continues to follow with her gastroenterologist, Dr.  Cristina Gong.   INTERVAL HISTORY:  [name] is here today for routine follow up of her stage III B-cell lymphoma, low-grade follicular type diagnosed in April 2013. She is here for routine follow up. She reports feeling well since her last visit. She continues to have cognitive concerns mostly impaired memory. She denies fevers or chills. She  denies pain. Her appetite is good. Her weight has been stable. CBC and CMP are unremarkable today.  REVIEW OF SYSTEMS:  Review of Systems  Constitutional: Negative for appetite change, chills, diaphoresis, fatigue, fever and unexpected weight change.  HENT:   Negative for hearing loss, lump/mass, mouth sores, nosebleeds, sore throat, tinnitus, trouble swallowing and voice change.   Eyes: Negative for eye problems and icterus.  Respiratory: Negative for chest tightness, cough, hemoptysis, shortness of breath and wheezing.   Cardiovascular: Negative for chest pain, leg swelling and palpitations.  Gastrointestinal: Negative for abdominal distention, abdominal pain, blood in stool, constipation, diarrhea, nausea, rectal pain and vomiting.  Endocrine: Negative for hot flashes.  Genitourinary: Negative for bladder incontinence, difficulty urinating, dyspareunia, dysuria, frequency, hematuria, menstrual problem, nocturia, pelvic pain, vaginal bleeding and vaginal discharge.   Musculoskeletal: Negative for arthralgias, back pain, flank pain, gait problem, myalgias, neck pain and neck stiffness.  Skin: Negative for itching, rash and wound.  Neurological: Negative for dizziness, extremity weakness, gait problem, headaches, light-headedness, numbness, seizures and speech difficulty.  Hematological: Negative for adenopathy. Does not bruise/bleed easily.  Psychiatric/Behavioral: Negative for confusion, decreased concentration, depression, sleep disturbance and suicidal ideas. The patient is not nervous/anxious.      VITALS:  There were no vitals taken for this visit.  Wt  Readings from Last 3 Encounters:  02/28/20 123 lb 11.2 oz (56.1 kg)  12/04/14 110 lb 9.6 oz (50.2 kg)  12/22/13 102 lb 4.8 oz (46.4 kg)    There is no height or weight on file to calculate BMI.  Performance status (ECOG): 1 - Symptomatic but completely ambulatory  PHYSICAL EXAM:  Physical Exam Constitutional:      General: She is not in acute distress.    Appearance: Normal appearance. She is normal weight. She is not ill-appearing, toxic-appearing or diaphoretic.  HENT:     Head: Normocephalic and atraumatic.     Right Ear: Tympanic membrane, ear canal and external ear normal. There is no impacted cerumen.     Left Ear: Tympanic membrane, ear canal and external ear normal. There is no impacted cerumen.     Nose: Nose normal. No congestion or rhinorrhea.     Mouth/Throat:     Mouth: Mucous membranes are moist.     Pharynx: No oropharyngeal exudate or posterior oropharyngeal erythema.  Eyes:     General: No scleral icterus.       Right eye: No discharge.        Left eye: No discharge.     Extraocular Movements: Extraocular movements intact.     Conjunctiva/sclera: Conjunctivae normal.     Pupils: Pupils are equal, round, and reactive to light.  Neck:     Vascular: No carotid bruit.  Cardiovascular:     Rate and Rhythm: Normal rate and regular rhythm.     Pulses: Normal pulses.     Heart sounds: No murmur heard.  No friction rub. No gallop.   Pulmonary:     Effort: Pulmonary effort is normal. No respiratory distress.     Breath sounds: Normal breath sounds. No stridor. No wheezing, rhonchi or rales.  Chest:     Chest wall: No tenderness.  Abdominal:     General: Abdomen is flat. Bowel sounds are normal. There is no distension.     Palpations: Abdomen is soft. There is no mass.     Tenderness: There is no abdominal tenderness. There is no right CVA tenderness, left CVA tenderness, guarding or rebound.     Hernia: No hernia is present.  Musculoskeletal:        General: No  swelling, tenderness, deformity or signs of injury. Normal range of motion.     Cervical back: Normal range of motion and neck supple. No rigidity or tenderness.     Right lower leg: No edema.     Left lower leg: No edema.  Lymphadenopathy:     Cervical: No cervical adenopathy.  Skin:    General: Skin is warm and dry.     Capillary Refill: Capillary refill takes less than 2 seconds.     Coloration: Skin is not jaundiced or pale.     Findings: No bruising, erythema, lesion or rash.  Neurological:     General: No focal deficit present.     Mental Status: She is alert and oriented to person, place, and time. Mental status is at baseline.     Cranial Nerves: No cranial nerve deficit.     Sensory: No sensory deficit.     Motor: No weakness.     Coordination: Coordination normal.     Gait: Gait normal.     Deep Tendon Reflexes: Reflexes normal.  Psychiatric:        Mood and Affect: Mood normal.        Behavior:  Behavior normal.        Thought Content: Thought content normal.        Judgment: Judgment normal.    Lymph nodes:   There is no cervical, clavicular, axillary or inguinal lymphadenopathy.   LABS:   CBC Latest Ref Rng & Units 02/28/2020 12/04/2014 12/22/2013  WBC - 7.4 5.7 5.1  Hemoglobin 12.0 - 16.0 14.6 13.2 13.1  Hematocrit 36 - 46 43 39.2 39.6  Platelets 150 - 399 271 242 237   CMP Latest Ref Rng & Units 02/28/2020 12/22/2013 05/22/2013  Glucose 70 - 140 mg/dl - 88 117(H)  BUN 4 - 21 19 28.1(H) 4(L)  Creatinine 0.5 - 1.1 1.4(A) 1.2(H) 0.64  Sodium 137 - 147 140 139 140  Potassium 3.4 - 5.3 4.1 4.6 3.9  Chloride 99 - 108 104 - 107  CO2 13 - 22 25(A) 24 19  Calcium 8.7 - 10.7 9.0 9.7 7.5(L)  Total Protein 6.4 - 8.3 g/dL - 6.7 -  Total Bilirubin 0.20 - 1.20 mg/dL - 0.91 -  Alkaline Phos 25 - 125 82 36(L) -  AST 13 - 35 39(A) 17 -  ALT 7 - 35 19 10 -     No results found for: CEA1 / No results found for: CEA1 No results found for: PSA1 No results found for:  YYT035 No results found for: CAN125  No results found for: TOTALPROTELP, ALBUMINELP, A1GS, A2GS, BETS, BETA2SER, GAMS, MSPIKE, SPEI Lab Results  Component Value Date   TIBC 171 (L) 12/23/2011   FERRITIN 837 (H) 12/23/2011   IRONPCTSAT 9 (L) 12/23/2011   Lab Results  Component Value Date   LDH 203 12/04/2014   LDH 202 12/22/2013   LDH 181 12/31/2012    STUDIES:  No results found.    HISTORY:   Past Medical History:  Diagnosis Date   Arthritis    Arthritis 09/04/2011   Cancer 4/65/68   follicular B cell lymphoma   Candida esophagitis 12/75/1700   Complication of anesthesia    Dehydration 11/20/2011   Diarrhea 11/20/2011   Diverticulitis of colon with perforation 11/21/2011   Dysrhythmia    Tachycardia   Esophageal abnormality    strictures   Esophageal stricture    x 2   GERD (gastroesophageal reflux disease)    HTN (hypertension)    Hypertension    Osteoporosis    PONV (postoperative nausea and vomiting)    in the 1980's   Septic shock(785.52) 11/22/2011   TIA (transient ischemic attack)     Past Surgical History:  Procedure Laterality Date   ABDOMINAL HYSTERECTOMY     left ovaries   APPENDECTOMY     COLONOSCOPY N/A 05/18/2013   Procedure: COLONOSCOPY;  Surgeon: Lear Ng, MD;  Location: [facility];  Service: Endoscopy;  Laterality: N/A;   COLOSTOMY  11/22/2011   Procedure: COLOSTOMY;  Surgeon: Earnstine Regal, MD;  Location: [facility];  Service: General;  Laterality: N/A;  desending colostomy   COLOSTOMY CLOSURE N/A 05/20/2013   Procedure: COLOSTOMY CLOSURE;  Surgeon: Earnstine Regal, MD;  Location: [facility];  Service: General;  Laterality: N/A;   COLOSTOMY REVISION  11/22/2011   Procedure: COLON RESECTION SIGMOID;  Surgeon: Earnstine Regal, MD;  Location: [facility];  Service: General;  Laterality: N/A;   ESOPHAGOGASTRODUODENOSCOPY  01/06/2012   Procedure: ESOPHAGOGASTRODUODENOSCOPY (EGD);  Surgeon: Milus Banister, MD;  Location: [facility];  Service: Endoscopy;  Laterality: N/A;   ESOPHAGOGASTRODUODENOSCOPY N/A 05/16/2013   Procedure: ESOPHAGOGASTRODUODENOSCOPY (  EGD);  Surgeon: Lear Ng, MD;  Location: [facility];  Service: Endoscopy;  Laterality: N/A;   LAPAROTOMY  11/22/2011   Procedure: EXPLORATORY LAPAROTOMY;  Surgeon: Earnstine Regal, MD;  Location: [facility];  Service: General;  Laterality: N/A;  drainage of pelvic abscess   PARASTOMAL HERNIA REPAIR N/A 05/20/2013   Procedure: HERNIA REPAIR PARASTOMAL;  Surgeon: Earnstine Regal, MD;  Location: [facility];  Service: General;  Laterality: N/A;   PAROTIDECTOMY W/ NECK DISSECTION TOTAL     TONSILLECTOMY      Family History  Problem Relation Age of Onset   Cancer Mother 70       gastric   Hypertension Father        heart disease   Cancer Maternal Aunt 19       breast cancer   Cancer Maternal Uncle 58       colon    Social History:  reports that she has never smoked. She has never used smokeless tobacco. She reports that she does not drink alcohol and does not use drugs.The patient is accompanied by caretaker today.  Allergies:  Allergies  Allergen Reactions   Codeine Hives   Other Nausea And Vomiting    anesthesia   Penicillins Hives   Sulfa Antibiotics Hives    Current Medications: Current Outpatient Medications  Medication Sig Dispense Refill   acetaminophen (TYLENOL) 500 MG tablet Take 500 mg by mouth daily.     amLODipine (NORVASC) 5 MG tablet Take 5 mg by mouth daily.     atorvastatin (LIPITOR) 40 MG tablet Take 40 mg by mouth daily.     clopidogrel (PLAVIX) 75 MG tablet Take 75 mg by mouth daily.     dicyclomine (BENTYL) 10 MG capsule Take 10 mg by mouth daily.     ezetimibe (ZETIA) 10 MG tablet Take 10 mg by mouth daily.     loratadine (CLARITIN) 10 MG tablet Take 10 mg by mouth daily as needed for allergies.     metoprolol (LOPRESSOR) 50 MG tablet Take 50 mg by mouth 2 (two) times daily.     pantoprazole (PROTONIX)  40 MG tablet Take 40 mg by mouth daily.     No current facility-administered medications for this visit.     ASSESSMENT & PLAN:   Assessment/ Plan:  [name] is a 86 y.o. female  1. History of stage III follicular lymphoma.  She remains without obvious evidence of recurrent/progressive disease. 2. Chronic abdominal and back pain, felt to be most likely secondary to adhesions and previous compression fractures.   3. Chronic kidney disease, which has only mildly worsened.    4. Hypokalemia, mild.  I gave her a list of potassium rich foods to increase her level through diet. Improved today.  She now has a caretaker for 6 hours a day, mostly due to her cognitive concerns. We will repeat CBC, CMP and evaluation in 6 months. She will continue with her primary care physician. Her son who is an Materials engineer in [employer], is also very active in her care. The patient and her caretaker verbalize understanding of and agreement to the plans discussed today. They know to call the office should any new questions or concerns arise.      The patient understands the plans discussed today and is in agreement with them.    The patient knows to contact our office if She develops concerns prior to her next appointment.   I provided 20 minutes of face-to-face  time during this this encounter and > 50% was spent counseling as documented under my assessment and plan.    Melodye Ped, NP

## 2020-02-28 ENCOUNTER — Other Ambulatory Visit: Payer: Self-pay | Admitting: Hematology and Oncology

## 2020-02-28 ENCOUNTER — Telehealth: Payer: Self-pay | Admitting: Oncology

## 2020-02-28 ENCOUNTER — Other Ambulatory Visit: Payer: Self-pay

## 2020-02-28 ENCOUNTER — Inpatient Hospital Stay (INDEPENDENT_AMBULATORY_CARE_PROVIDER_SITE_OTHER): Payer: Medicare Other | Admitting: Hematology and Oncology

## 2020-02-28 ENCOUNTER — Inpatient Hospital Stay: Payer: Medicare Other | Attending: Hematology and Oncology

## 2020-02-28 DIAGNOSIS — R4189 Other symptoms and signs involving cognitive functions and awareness: Secondary | ICD-10-CM

## 2020-02-28 DIAGNOSIS — Z0001 Encounter for general adult medical examination with abnormal findings: Secondary | ICD-10-CM | POA: Diagnosis not present

## 2020-02-28 DIAGNOSIS — Z8572 Personal history of non-Hodgkin lymphomas: Secondary | ICD-10-CM | POA: Diagnosis not present

## 2020-02-28 DIAGNOSIS — R109 Unspecified abdominal pain: Secondary | ICD-10-CM | POA: Diagnosis not present

## 2020-02-28 DIAGNOSIS — Z803 Family history of malignant neoplasm of breast: Secondary | ICD-10-CM

## 2020-02-28 DIAGNOSIS — E876 Hypokalemia: Secondary | ICD-10-CM

## 2020-02-28 DIAGNOSIS — D649 Anemia, unspecified: Secondary | ICD-10-CM | POA: Diagnosis not present

## 2020-02-28 DIAGNOSIS — N189 Chronic kidney disease, unspecified: Secondary | ICD-10-CM

## 2020-02-28 DIAGNOSIS — C8280 Other types of follicular lymphoma, unspecified site: Secondary | ICD-10-CM

## 2020-02-28 DIAGNOSIS — M549 Dorsalgia, unspecified: Secondary | ICD-10-CM | POA: Diagnosis not present

## 2020-02-28 DIAGNOSIS — I129 Hypertensive chronic kidney disease with stage 1 through stage 4 chronic kidney disease, or unspecified chronic kidney disease: Secondary | ICD-10-CM

## 2020-02-28 DIAGNOSIS — M810 Age-related osteoporosis without current pathological fracture: Secondary | ICD-10-CM | POA: Diagnosis not present

## 2020-02-28 DIAGNOSIS — Z800 Family history of malignant neoplasm of digestive organs: Secondary | ICD-10-CM

## 2020-02-28 DIAGNOSIS — G8929 Other chronic pain: Secondary | ICD-10-CM

## 2020-02-28 LAB — CBC AND DIFFERENTIAL
HCT: 43 (ref 36–46)
Hemoglobin: 14.6 (ref 12.0–16.0)
Neutrophils Absolute: 5.85
Platelets: 271 (ref 150–399)
WBC: 7.4

## 2020-02-28 LAB — HEPATIC FUNCTION PANEL
ALT: 19 (ref 7–35)
AST: 39 — AB (ref 13–35)
Alkaline Phosphatase: 82 (ref 25–125)
Bilirubin, Total: 0.8

## 2020-02-28 LAB — BASIC METABOLIC PANEL
BUN: 19 (ref 4–21)
CO2: 25 — AB (ref 13–22)
Chloride: 104 (ref 99–108)
Creatinine: 1.4 — AB (ref 0.5–1.1)
Glucose: 127
Potassium: 4.1 (ref 3.4–5.3)
Sodium: 140 (ref 137–147)

## 2020-02-28 LAB — COMPREHENSIVE METABOLIC PANEL
Albumin: 4.7 (ref 3.5–5.0)
Calcium: 9 (ref 8.7–10.7)

## 2020-02-28 LAB — CBC: RBC: 4.71 (ref 3.87–5.11)

## 2020-05-24 DIAGNOSIS — F0390 Unspecified dementia without behavioral disturbance: Secondary | ICD-10-CM | POA: Diagnosis not present

## 2020-05-24 DIAGNOSIS — E559 Vitamin D deficiency, unspecified: Secondary | ICD-10-CM | POA: Diagnosis not present

## 2020-05-24 DIAGNOSIS — D510 Vitamin B12 deficiency anemia due to intrinsic factor deficiency: Secondary | ICD-10-CM | POA: Diagnosis not present

## 2020-05-24 DIAGNOSIS — I10 Essential (primary) hypertension: Secondary | ICD-10-CM | POA: Diagnosis not present

## 2020-05-24 DIAGNOSIS — Z6824 Body mass index (BMI) 24.0-24.9, adult: Secondary | ICD-10-CM | POA: Diagnosis not present

## 2020-05-24 DIAGNOSIS — E7800 Pure hypercholesterolemia, unspecified: Secondary | ICD-10-CM | POA: Diagnosis not present

## 2020-05-24 DIAGNOSIS — Z79899 Other long term (current) drug therapy: Secondary | ICD-10-CM | POA: Diagnosis not present

## 2020-06-15 DIAGNOSIS — N183 Chronic kidney disease, stage 3 unspecified: Secondary | ICD-10-CM | POA: Diagnosis not present

## 2020-06-15 DIAGNOSIS — G459 Transient cerebral ischemic attack, unspecified: Secondary | ICD-10-CM | POA: Diagnosis not present

## 2020-06-15 DIAGNOSIS — I129 Hypertensive chronic kidney disease with stage 1 through stage 4 chronic kidney disease, or unspecified chronic kidney disease: Secondary | ICD-10-CM | POA: Diagnosis not present

## 2020-06-15 DIAGNOSIS — E785 Hyperlipidemia, unspecified: Secondary | ICD-10-CM | POA: Diagnosis not present

## 2020-06-26 DIAGNOSIS — D510 Vitamin B12 deficiency anemia due to intrinsic factor deficiency: Secondary | ICD-10-CM | POA: Diagnosis not present

## 2020-08-02 DIAGNOSIS — D510 Vitamin B12 deficiency anemia due to intrinsic factor deficiency: Secondary | ICD-10-CM | POA: Diagnosis not present

## 2020-08-13 DIAGNOSIS — M810 Age-related osteoporosis without current pathological fracture: Secondary | ICD-10-CM | POA: Diagnosis not present

## 2020-08-28 ENCOUNTER — Inpatient Hospital Stay: Payer: Medicare Other

## 2020-08-28 ENCOUNTER — Inpatient Hospital Stay: Payer: Medicare Other | Admitting: Oncology

## 2020-09-07 DIAGNOSIS — D510 Vitamin B12 deficiency anemia due to intrinsic factor deficiency: Secondary | ICD-10-CM | POA: Diagnosis not present

## 2020-10-18 DIAGNOSIS — D510 Vitamin B12 deficiency anemia due to intrinsic factor deficiency: Secondary | ICD-10-CM | POA: Diagnosis not present

## 2020-11-12 NOTE — Progress Notes (Signed)
[facility]  [address] [place],  [place]  [address] [phone]  Clinic Day:  11/20/2020  Referring physician: [facility]  This document serves as a record of services personally performed by Hosie Poisson, MD. It was created on their behalf by Curry,Lauren E, a trained medical scribe. The creation of this record is based on the scribe's personal observations and the provider's statements to them.  CHIEF COMPLAINT:  CC: History of stage III B-cell lymphoma, low-grade follicular type   Current Treatment:  Observation   HISTORY OF PRESENT ILLNESS:  [name] is a 87 y.o. female with a history of stage III B-cell lymphoma, low-grade follicular type diagnosed in April 2013. We began seeing in April 2017 when she transferred her care here.  Initial PET scan revealed adenopathy of the axillary and subpectoral areas, as well as retroperitoneal and pelvic adenopathy.  There were liver lesions seen, which were not hypermetabolic.  She was treated with CVP/Rituxan from June 2013 to September 2013 and had a near complete response.   She then developed a bowel perforation and had a prolonged hospitalization for a perforated diverticulum with peritonitis and sepsis.  She even required ventilator support and tracheostomy, but eventually recovered.  She has had no further therapy for her lymphoma.  She has not had evidence of progressive disease.  Unfortunately, she has had chronic abdominal pain since her bowel surgery, felt to be most likely secondary to adhesions.  She also has compression fractures at T9 and T10 and has had kyphoplasty to these areas.  She has multiple comorbidities including hypertension, TIA's, osteoarthritis, osteoporosis, and prior atrial fibrillation.  She intermittently seeks medical care for increases in her abdominal pain.  The most recent CT abdomen and pelvis in February did not reveal any progressive lymphoma.     INTERVAL HISTORY:  [name] is here for routine follow up and states that she has been doing well.  She has intermittent spasms underneath the right rib cage, which has been evaluated, but no significant etiology was found.  She has worsening short term memory.  Blood counts are unremarkable except for a differential including 14.1% lymphocytes.  Chemistries are unremarkable except for a creatinine of 1.3, previously 1.4.  Her  appetite is good, and she has gained 1 pound since her last visit.  She denies fever, chills or other signs of infection.  She denies nausea, vomiting, bowel issues, or abdominal pain.  She denies sore throat, cough, dyspnea, or chest pain.  Her daughter accompanies her today and acts as partial historian.  REVIEW OF SYSTEMS:  Review of Systems  Constitutional: Negative.  Negative for appetite change, chills, fatigue, fever and unexpected weight change.  HENT:  Negative.    Eyes: Negative.   Respiratory: Negative.  Negative for chest tightness, cough, hemoptysis, shortness of breath and wheezing.   Cardiovascular: Negative.  Negative for chest pain, leg swelling and palpitations.  Gastrointestinal: Negative.  Negative for abdominal distention, abdominal pain, blood in stool, constipation, diarrhea, nausea and vomiting.  Endocrine: Negative.   Genitourinary: Negative.  Negative for difficulty urinating, dysuria, frequency and hematuria.   Musculoskeletal:  Negative for arthralgias, back pain, flank pain, gait problem and myalgias.       Intermittent spasms of the right upper quadrant  Skin: Negative.   Neurological: Negative.  Negative for dizziness, extremity weakness, gait problem, headaches, light-headedness, numbness, seizures and speech difficulty.  Hematological: Negative.   Psychiatric/Behavioral:  Positive for confusion (worsening  short term memory). Negative for depression and sleep disturbance. The patient is not nervous/anxious.     VITALS:  Blood pressure  127/61, pulse 64, temperature 97.7 F (36.5 C), temperature source Oral, resp. rate 18, height '4\' 10"'$  (1.473 m), weight 124 lb 11.2 oz (56.6 kg), SpO2 95 %.  Wt Readings from Last 3 Encounters:  11/20/20 124 lb 11.2 oz (56.6 kg)  02/28/20 123 lb 11.2 oz (56.1 kg)  12/04/14 110 lb 9.6 oz (50.2 kg)    Body mass index is 26.06 kg/m.  Performance status (ECOG): 1 - Symptomatic but completely ambulatory  PHYSICAL EXAM:  Physical Exam Constitutional:      General: She is not in acute distress.    Appearance: Normal appearance. She is normal weight.  HENT:     Head: Normocephalic and atraumatic.  Eyes:     General: No scleral icterus.    Extraocular Movements: Extraocular movements intact.     Conjunctiva/sclera: Conjunctivae normal.     Pupils: Pupils are equal, round, and reactive to light.  Cardiovascular:     Rate and Rhythm: Normal rate and regular rhythm.     Pulses: Normal pulses.     Heart sounds: Normal heart sounds. No murmur heard.   No friction rub. No gallop.  Pulmonary:     Effort: Pulmonary effort is normal. No respiratory distress.     Breath sounds: Normal breath sounds.  Abdominal:     General: Bowel sounds are normal. There is no distension.     Palpations: Abdomen is soft. There is no hepatomegaly, splenomegaly or mass.     Tenderness: There is no abdominal tenderness.  Musculoskeletal:        General: Normal range of motion.     Cervical back: Normal range of motion and neck supple.     Right lower leg: No edema.     Left lower leg: No edema.  Lymphadenopathy:     Cervical: No cervical adenopathy.  Skin:    General: Skin is warm and dry.  Neurological:     General: No focal deficit present.     Mental Status: She is alert and oriented to person, place, and time. Mental status is at baseline.  Psychiatric:        Mood and Affect: Mood normal.        Behavior: Behavior normal.        Thought Content: Thought content normal.        Judgment: Judgment  normal.   Lymph nodes:   There is no cervical, clavicular, axillary or inguinal lymphadenopathy.   LABS:   CBC Latest Ref Rng & Units 11/20/2020 02/28/2020 12/04/2014  WBC - 7.2 7.4 5.7  Hemoglobin 12.0 - 16.0 15.0 14.6 13.2  Hematocrit 36 - 46 42 43 39.2  Platelets 150 - 399 262 271 242   CMP Latest Ref Rng & Units 11/20/2020 02/28/2020 12/22/2013  Glucose 70 - 140 mg/dl - - 88  BUN 4 - '21 15 19 '$ 28.1(H)  Creatinine 0.5 - 1.1 1.3(A) 1.4(A) 1.2(H)  Sodium 137 - 147 137 140 139  Potassium 3.4 - 5.3 3.9 4.1 4.6  Chloride 99 - 108 104 104 -  CO2 13 - 22 22 25(A) 24  Calcium 8.7 - 10.7 8.4(A) 9.0 9.7  Total Protein 6.4 - 8.3 g/dL - - 6.7  Total Bilirubin 0.20 - 1.20 mg/dL - - 0.91  Alkaline Phos 25 - 125 61 82 36(L)  AST 13 - 35 30 39(A) 17  ALT 7 - 35 '16 19 10     '$ Lab Results  Component Value Date   TIBC 171 (L) 12/23/2011   FERRITIN 837 (H) 12/23/2011   IRONPCTSAT 9 (L) 12/23/2011   Lab Results  Component Value Date   LDH 203 12/04/2014   LDH 202 12/22/2013   LDH 181 12/31/2012    STUDIES:  No results found.    HISTORY:   Allergies:  Allergies  Allergen Reactions   Codeine Hives and Other (See Comments)   Lisinopril     Other reaction(s): Other (See Comments) UNKNOWN UNKNOWN    Ibuprofen     Other reaction(s): Other (See Comments) Contraindicated   Penicillins Hives   Sulfa Antibiotics Hives   Sulfasalazine Hives   Other Nausea And Vomiting and Rash    anesthesia Itching and burning Itching and burning     Current Medications: Current Outpatient Medications  Medication Sig Dispense Refill   DULoxetine (CYMBALTA) 30 MG capsule Take 1 capsule by mouth daily.     ergocalciferol (VITAMIN D2) 1.25 MG (50000 UT) capsule TAKE ONE CAPSULE BY MOUTH TWICE A WEEK     acetaminophen (TYLENOL) 500 MG tablet Take 500 mg by mouth daily.     ALPRAZolam (XANAX) 0.25 MG tablet Take 0.125-0.25 mg by mouth daily as needed.     amLODipine (NORVASC) 5 MG tablet Take 5  mg by mouth daily.     atorvastatin (LIPITOR) 10 MG tablet Take 10 mg by mouth at bedtime.     clopidogrel (PLAVIX) 75 MG tablet Take 75 mg by mouth daily.     cyanocobalamin (,VITAMIN B-12,) 1000 MCG/ML injection Inject 1 mL as directed every 30 days.     denosumab (PROLIA) 60 MG/ML SOSY injection Inject into the skin.     dicyclomine (BENTYL) 10 MG capsule Take 10 mg by mouth daily.     ezetimibe (ZETIA) 10 MG tablet Take 10 mg by mouth daily.     loratadine (CLARITIN) 10 MG tablet Take 10 mg by mouth daily as needed for allergies.     metoprolol (LOPRESSOR) 50 MG tablet Take 50 mg by mouth 2 (two) times daily.     pantoprazole (PROTONIX) 40 MG tablet Take 40 mg by mouth daily.     No current facility-administered medications for this visit.     ASSESSMENT & PLAN:   Assessment:   1. History of stage III follicular lymphoma.  She remains without obvious evidence of recurrent/progressive disease.  2. Chronic abdominal and back pain, felt to be most likely secondary to adhesions and previous compression fractures.    3. Chronic kidney disease, which has only mildly improved.     4. Lymphopenia, chronic from prior treatment for her lymphoma.  Plan: We flushed her port today and will repeat this again in 6 months. We will see her back in 6 months with CBC, CMP and port flush for repeat evaluation. She will continue with her primary care physician.  The patient and her caretaker verbalize understanding of and agreement to the plans discussed today. They know to call the office should any new questions or concerns arise.    I provided 15 minutes of face-to-face time during this this encounter and > 50% was spent counseling as documented under my assessment and plan.    I, Rita Ohara, am acting as scribe for Derwood Kaplan, MD  I have reviewed this report as typed by the medical scribe, and it is complete and accurate.

## 2020-11-16 ENCOUNTER — Other Ambulatory Visit: Payer: Self-pay

## 2020-11-20 ENCOUNTER — Inpatient Hospital Stay (INDEPENDENT_AMBULATORY_CARE_PROVIDER_SITE_OTHER): Payer: Medicare Other | Admitting: Oncology

## 2020-11-20 ENCOUNTER — Other Ambulatory Visit: Payer: Self-pay | Admitting: Hematology and Oncology

## 2020-11-20 ENCOUNTER — Telehealth: Payer: Self-pay | Admitting: Oncology

## 2020-11-20 ENCOUNTER — Inpatient Hospital Stay: Payer: Medicare Other | Attending: Oncology

## 2020-11-20 ENCOUNTER — Encounter: Payer: Self-pay | Admitting: Oncology

## 2020-11-20 ENCOUNTER — Other Ambulatory Visit: Payer: Self-pay | Admitting: Oncology

## 2020-11-20 VITALS — BP 127/61 | HR 64 | Temp 97.7°F | Resp 18 | Ht <= 58 in | Wt 124.7 lb

## 2020-11-20 DIAGNOSIS — C8280 Other types of follicular lymphoma, unspecified site: Secondary | ICD-10-CM

## 2020-11-20 LAB — COMPREHENSIVE METABOLIC PANEL
Albumin: 4.6 (ref 3.5–5.0)
Calcium: 8.4 — AB (ref 8.7–10.7)

## 2020-11-20 LAB — CBC AND DIFFERENTIAL
HCT: 42 (ref 36–46)
Hemoglobin: 15 (ref 12.0–16.0)
Neutrophils Absolute: 5.54
Platelets: 262 (ref 150–399)
WBC: 7.2

## 2020-11-20 LAB — BASIC METABOLIC PANEL
BUN: 15 (ref 4–21)
CO2: 22 (ref 13–22)
Chloride: 104 (ref 99–108)
Creatinine: 1.3 — AB (ref 0.5–1.1)
Glucose: 152
Potassium: 3.9 (ref 3.4–5.3)
Sodium: 137 (ref 137–147)

## 2020-11-20 LAB — HEPATIC FUNCTION PANEL
ALT: 16 (ref 7–35)
AST: 30 (ref 13–35)
Alkaline Phosphatase: 61 (ref 25–125)
Bilirubin, Total: 0.5

## 2020-11-20 LAB — CBC: RBC: 4.72 (ref 3.87–5.11)

## 2020-11-20 NOTE — Telephone Encounter (Signed)
Per 8/30 LOS next appt scheduled and confirmed by patient

## 2020-12-05 DIAGNOSIS — D510 Vitamin B12 deficiency anemia due to intrinsic factor deficiency: Secondary | ICD-10-CM | POA: Diagnosis not present

## 2020-12-06 DIAGNOSIS — Z23 Encounter for immunization: Secondary | ICD-10-CM | POA: Diagnosis not present

## 2021-01-15 DIAGNOSIS — J209 Acute bronchitis, unspecified: Secondary | ICD-10-CM | POA: Diagnosis not present

## 2021-01-15 DIAGNOSIS — Z20822 Contact with and (suspected) exposure to covid-19: Secondary | ICD-10-CM | POA: Diagnosis not present

## 2021-01-17 DIAGNOSIS — Z6825 Body mass index (BMI) 25.0-25.9, adult: Secondary | ICD-10-CM | POA: Diagnosis not present

## 2021-01-17 DIAGNOSIS — Z20828 Contact with and (suspected) exposure to other viral communicable diseases: Secondary | ICD-10-CM | POA: Diagnosis not present

## 2021-01-17 DIAGNOSIS — J209 Acute bronchitis, unspecified: Secondary | ICD-10-CM | POA: Diagnosis not present

## 2021-01-31 DIAGNOSIS — D510 Vitamin B12 deficiency anemia due to intrinsic factor deficiency: Secondary | ICD-10-CM | POA: Diagnosis not present

## 2021-02-21 DIAGNOSIS — I10 Essential (primary) hypertension: Secondary | ICD-10-CM | POA: Diagnosis not present

## 2021-02-21 DIAGNOSIS — Z1331 Encounter for screening for depression: Secondary | ICD-10-CM | POA: Diagnosis not present

## 2021-02-21 DIAGNOSIS — Z6824 Body mass index (BMI) 24.0-24.9, adult: Secondary | ICD-10-CM | POA: Diagnosis not present

## 2021-02-21 DIAGNOSIS — M810 Age-related osteoporosis without current pathological fracture: Secondary | ICD-10-CM | POA: Diagnosis not present

## 2021-02-21 DIAGNOSIS — Z0000 Encounter for general adult medical examination without abnormal findings: Secondary | ICD-10-CM | POA: Diagnosis not present

## 2021-03-19 DIAGNOSIS — D510 Vitamin B12 deficiency anemia due to intrinsic factor deficiency: Secondary | ICD-10-CM | POA: Diagnosis not present

## 2021-04-19 DIAGNOSIS — D510 Vitamin B12 deficiency anemia due to intrinsic factor deficiency: Secondary | ICD-10-CM | POA: Diagnosis not present

## 2021-05-13 DIAGNOSIS — M25511 Pain in right shoulder: Secondary | ICD-10-CM | POA: Diagnosis not present

## 2021-05-20 NOTE — Progress Notes (Signed)
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  She remains without evidence of recurrent disease.  The patient has worsening dementia, so the family requested we space out her visits.  We will plan to see her back in 1 year with a CBC, comprehensive metabolic panel and LDH for repeat clinical assessment.  Leukocytosis New leukocytosis, with elevated neutrophils neutrophils.  No abnormal appearing white blood cells.  She does not have any symptoms of infection.  I will check a urinalysis and urine culture to be sure she does not have asymptomatic urinary tract infection.   The patient understands the plans discussed today and is in agreement with them.  She knows to contact our office if she develops concerns prior to her next appointment.     Marvia Pickles, PA-C  [facility] [address] [place] [address] Dept: [phone] Dept Fax: [phone]   Orders Placed This Encounter  Procedures   Miscellaneous test (send-out)    Standing Status:   Future    Number of Occurrences:   1    Standing Expiration Date:   05/21/2022    Order Specific Question:   Test name / description:    Answer:   urinalysis and urine culture to [facility]   CBC with Differential ([place] Only)    Standing Status:   Future    Standing Expiration Date:   05/21/2022   CMP ([place] only)    Standing Status:   Future    Standing Expiration Date:   05/21/2022   Lactate dehydrogenase    Standing Status:   Future    Standing Expiration Date:   7/62/8315   Basic metabolic panel    This external  order was created through the Results Console.   Comprehensive metabolic panel    This external order was created through the Results Console.   Hepatic function panel    This external order was created through the Results Console.      CHIEF COMPLAINT:  CC: History of stage IIIB low-grade follicular lymphoma  Current Treatment: Observation  HISTORY OF PRESENT ILLNESS:  [name] is a 88 year old female with a history of stage III B-cell lymphoma, low-grade follicular type diagnosed in April 2013. We began seeing in April 2017 when she transferred her care here.  Initial PET scan revealed adenopathy of the axillary and subpectoral areas, as well as retroperitoneal and pelvic adenopathy.  There were liver lesions seen, which were not hypermetabolic.  She was treated with CVP/Rituxan from June 2013 to September 2013 and had a near complete response.   She then developed a bowel perforation and had a prolonged hospitalization for a perforated diverticulum with peritonitis and sepsis.  She even required ventilator support and tracheostomy, but eventually recovered.  She has had no further therapy for her lymphoma.  She has not had evidence of progressive disease.  Unfortunately, she has had chronic abdominal pain since her bowel surgery, felt to be most likely secondary to adhesions.  She also has compression fractures at T9 and T10 and has had kyphoplasty to these areas.  She has multiple comorbidities including  hypertension, TIA's, osteoarthritis, osteoporosis, and prior atrial fibrillation.  She intermittently seeks medical care for increases in her abdominal pain.  Her last CT abdomen and pelvis in February 2018 did not reveal any recurrent lymphoma.     Oncology History  Follicular low grade B-cell lymphoma ([place])  07/22/2011 Initial Diagnosis   Follicular low grade B-cell lymphoma   07/29/2011 PET scan   Bilateral axillary, subpectoral and an, 5 mm K. node, right-sided pelvic sidewall  and then 1.9 cm SUV 14.8, bilateral right greater than left inguinal adenopathy 2.4 x 3.3 right groin   09/05/2011 - 12/10/2011 Chemotherapy   Rituxan CVP x4 cycles   11/11/2011 PET scan   Near-complete response with only single small right inguinal lymph node with persistent low-grade SUV 3.2 activity   11/24/2011 Surgery   Colon Segmental resection: Extensive suppurative acute serositis 1 benign lymph node       INTERVAL HISTORY:  [name] is here today for repeat clinical assessment and states she has been doing well. She denies fevers or chills.  She denies other signs of infection such as sore throat, sinus congestion, cough, diarrhea or urinary symptoms.  She reports continued intermittent abdominal cramping, which is stable. She denies pain. Her appetite is good. Her weight has decreased 3 pounds over last 6 months .  She is accompanied by her caregiver, who states the patient's dementia is worsening and no treatment is planned.  She states the family would like to space out the patient's visits to yearly.  REVIEW OF SYSTEMS:  Review of Systems  Constitutional:  Negative for appetite change, chills, fatigue, fever and unexpected weight change.  HENT:   Negative for lump/mass, mouth sores and sore throat.   Respiratory:  Negative for cough and shortness of breath.   Cardiovascular:  Negative for chest pain and leg swelling.  Gastrointestinal:  Negative for abdominal pain, constipation, diarrhea, nausea and vomiting.  Endocrine: Negative for hot flashes.  Genitourinary:  Negative for difficulty urinating, dysuria, frequency and hematuria.   Musculoskeletal:  Negative for arthralgias, back pain and myalgias.  Skin:  Negative for rash.  Neurological:  Negative for dizziness and headaches.  Hematological:  Negative for adenopathy. Does not bruise/bleed easily.  Psychiatric/Behavioral:  Negative for depression and sleep disturbance. The patient is not nervous/anxious.     VITALS:  Blood  pressure 126/65, pulse 68, temperature 97.6 F (36.4 C), temperature source Oral, resp. rate 18, height 4\' 11"  (1.499 m), weight 121 lb 3.2 oz (55 kg), SpO2 96 %.  Wt Readings from Last 3 Encounters:  05/21/21 121 lb 3.2 oz (55 kg)  11/20/20 124 lb 11.2 oz (56.6 kg)  02/28/20 123 lb 11.2 oz (56.1 kg)    Body mass index is 24.48 kg/m.  Performance status (ECOG): 1 - Symptomatic but completely ambulatory  PHYSICAL EXAM:  Physical Exam Vitals and nursing note reviewed.  Constitutional:      General: She is not in acute distress.    Appearance: Normal appearance.  HENT:     Head: Normocephalic and atraumatic.     Mouth/Throat:     Mouth: Mucous membranes are moist.     Pharynx: Oropharynx is clear. No oropharyngeal exudate or posterior oropharyngeal erythema.  Eyes:     General: No scleral icterus.    Extraocular Movements: Extraocular movements intact.     Conjunctiva/sclera: Conjunctivae normal.     Pupils: Pupils are equal, round, and reactive to light.  Cardiovascular:     Rate and Rhythm: Normal rate  and regular rhythm.     Heart sounds: Normal heart sounds. No murmur heard.   No friction rub. No gallop.  Pulmonary:     Effort: Pulmonary effort is normal.     Breath sounds: Normal breath sounds. No wheezing, rhonchi or rales.  Abdominal:     General: There is no distension.     Palpations: Abdomen is soft. There is no hepatomegaly, splenomegaly or mass.     Tenderness: There is no abdominal tenderness.  Musculoskeletal:        General: Normal range of motion.     Cervical back: Normal range of motion and neck supple. No tenderness.     Right lower leg: No edema.     Left lower leg: No edema.  Lymphadenopathy:     Cervical: No cervical adenopathy.     Upper Body:     Right upper body: No supraclavicular or axillary adenopathy.     Left upper body: No supraclavicular or axillary adenopathy.     Lower Body: No right inguinal adenopathy. No left inguinal adenopathy.   Skin:    General: Skin is warm and dry.     Coloration: Skin is not jaundiced.     Findings: No rash.  Neurological:     Mental Status: She is alert and oriented to person, place, and time.     Cranial Nerves: No cranial nerve deficit.  Psychiatric:        Mood and Affect: Mood normal.        Behavior: Behavior normal.        Thought Content: Thought content normal.    LABS:   CBC Latest Ref Rng & Units 05/21/2021 11/20/2020 02/28/2020  WBC - 11.7 7.2 7.4  Hemoglobin 12.0 - 16.0 15.8 15.0 14.6  Hematocrit 36 - 46 48(A) 42 43  Platelets 150 - 399 313 262 271   CMP Latest Ref Rng & Units 05/21/2021 11/20/2020 02/28/2020  Glucose 70 - 140 mg/dl - - -  BUN 4 - 21 20 15 19   Creatinine 0.5 - 1.1 1.2(A) 1.3(A) 1.4(A)  Sodium 137 - 147 141 137 140  Potassium 3.4 - 5.3 3.8 3.9 4.1  Chloride 99 - 108 106 104 104  CO2 13 - 22 23(A) 22 25(A)  Calcium 8.7 - 10.7 8.9 8.4(A) 9.0  Total Protein 6.4 - 8.3 g/dL - - -  Total Bilirubin 0.20 - 1.20 mg/dL - - -  Alkaline Phos 25 - 125 56 61 82  AST 13 - 35 28 30 39(A)  ALT 7 - 35 23 16 19      No results found for: CEA1 / No results found for: CEA1 No results found for: PSA1 No results found for: YWV371 No results found for: CAN125  No results found for: TOTALPROTELP, ALBUMINELP, A1GS, A2GS, BETS, BETA2SER, GAMS, MSPIKE, SPEI Lab Results  Component Value Date   TIBC 171 (L) 12/23/2011   FERRITIN 837 (H) 12/23/2011   IRONPCTSAT 9 (L) 12/23/2011   Lab Results  Component Value Date   LDH 203 12/04/2014   LDH 202 12/22/2013   LDH 181 12/31/2012    STUDIES:  No results found.    HISTORY:   Past Medical History:  Diagnosis Date   Arthritis    Arthritis 09/04/2011   Cancer ([place]) 0/62/69   follicular B cell lymphoma   Candida esophagitis ([place]) 48/54/6270   Complication of anesthesia    Dehydration 11/20/2011   Diarrhea 11/20/2011   Diverticulitis of colon with perforation 11/21/2011  Dysrhythmia    Tachycardia   Esophageal  abnormality    strictures   Esophageal stricture    x 2   GERD (gastroesophageal reflux disease)    HTN (hypertension)    Hypertension    Osteoporosis    PONV (postoperative nausea and vomiting)    in the 1980's   Septic shock(785.52) 11/22/2011   TIA (transient ischemic attack)     Past Surgical History:  Procedure Laterality Date   ABDOMINAL HYSTERECTOMY     left ovaries   APPENDECTOMY     COLONOSCOPY N/A 05/18/2013   Procedure: COLONOSCOPY;  Surgeon: Lear Ng, MD;  Location: [facility];  Service: Endoscopy;  Laterality: N/A;   COLOSTOMY  11/22/2011   Procedure: COLOSTOMY;  Surgeon: Earnstine Regal, MD;  Location: [facility];  Service: General;  Laterality: N/A;  desending colostomy   COLOSTOMY CLOSURE N/A 05/20/2013   Procedure: COLOSTOMY CLOSURE;  Surgeon: Earnstine Regal, MD;  Location: [facility];  Service: General;  Laterality: N/A;   COLOSTOMY REVISION  11/22/2011   Procedure: COLON RESECTION SIGMOID;  Surgeon: Earnstine Regal, MD;  Location: [facility];  Service: General;  Laterality: N/A;   ESOPHAGOGASTRODUODENOSCOPY  01/06/2012   Procedure: ESOPHAGOGASTRODUODENOSCOPY (EGD);  Surgeon: Milus Banister, MD;  Location: [facility];  Service: Endoscopy;  Laterality: N/A;   ESOPHAGOGASTRODUODENOSCOPY N/A 05/16/2013   Procedure: ESOPHAGOGASTRODUODENOSCOPY (EGD);  Surgeon: Lear Ng, MD;  Location: [facility];  Service: Endoscopy;  Laterality: N/A;   LAPAROTOMY  11/22/2011   Procedure: EXPLORATORY LAPAROTOMY;  Surgeon: Earnstine Regal, MD;  Location: [facility];  Service: General;  Laterality: N/A;  drainage of pelvic abscess   PARASTOMAL HERNIA REPAIR N/A 05/20/2013   Procedure: HERNIA REPAIR PARASTOMAL;  Surgeon: Earnstine Regal, MD;  Location: [facility];  Service: General;  Laterality: N/A;   PAROTIDECTOMY W/ NECK DISSECTION TOTAL     TONSILLECTOMY      Family History  Problem Relation Age of Onset   Cancer Mother 23       gastric   Hypertension Father        heart disease    Cancer Maternal Aunt 12       breast cancer   Cancer Maternal Uncle 58       colon    Social History:  reports that she has never smoked. She has never used smokeless tobacco. She reports that she does not drink alcohol and does not use drugs.The patient is accompanied by her caregiver today.  Allergies:  Allergies  Allergen Reactions   Codeine Hives and Other (See Comments)   Lisinopril     Other reaction(s): Other (See Comments) UNKNOWN UNKNOWN    Ibuprofen     Other reaction(s): Other (See Comments) Contraindicated   Penicillins Hives   Sulfa Antibiotics Hives   Sulfasalazine Hives   Other Nausea And Vomiting and Rash    anesthesia Itching and burning Itching and burning     Current Medications: Current Outpatient Medications  Medication Sig Dispense Refill   acetaminophen (TYLENOL) 500 MG tablet Take 500 mg by mouth daily.     ALPRAZolam (XANAX) 0.25 MG tablet Take 0.125-0.25 mg by mouth daily as needed.     amLODipine (NORVASC) 5 MG tablet Take 5 mg by mouth daily.     atorvastatin (LIPITOR) 10 MG tablet Take 10 mg by mouth at bedtime.     clopidogrel (PLAVIX) 75 MG tablet Take 75 mg by mouth daily.     cyanocobalamin (,VITAMIN B-12,)  1000 MCG/ML injection Inject 1 mL as directed every 30 days.     denosumab (PROLIA) 60 MG/ML SOSY injection Inject into the skin.     dicyclomine (BENTYL) 10 MG capsule Take 10 mg by mouth daily.     DULoxetine (CYMBALTA) 30 MG capsule Take 1 capsule by mouth daily.     ergocalciferol (VITAMIN D2) 1.25 MG (50000 UT) capsule TAKE ONE CAPSULE BY MOUTH TWICE A WEEK     ezetimibe (ZETIA) 10 MG tablet Take 10 mg by mouth daily.     loratadine (CLARITIN) 10 MG tablet Take 10 mg by mouth daily as needed for allergies.     metoprolol (LOPRESSOR) 50 MG tablet Take 50 mg by mouth 2 (two) times daily.     pantoprazole (PROTONIX) 40 MG tablet Take 40 mg by mouth daily.     No current facility-administered medications for this visit.

## 2021-05-20 NOTE — Assessment & Plan Note (Addendum)
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  She remains without evidence of recurrent disease.  The patient has worsening dementia, so the family requested we space out her visits.  We will plan to see her back in 1 year with a CBC, comprehensive metabolic panel and LDH for repeat clinical assessment.

## 2021-05-21 ENCOUNTER — Encounter: Payer: Self-pay | Admitting: Hematology and Oncology

## 2021-05-21 ENCOUNTER — Telehealth: Payer: Self-pay | Admitting: Hematology and Oncology

## 2021-05-21 ENCOUNTER — Other Ambulatory Visit: Payer: Self-pay

## 2021-05-21 ENCOUNTER — Inpatient Hospital Stay: Payer: Medicare Other

## 2021-05-21 ENCOUNTER — Inpatient Hospital Stay: Payer: Medicare Other | Attending: Hematology and Oncology | Admitting: Hematology and Oncology

## 2021-05-21 VITALS — BP 126/65 | HR 68 | Temp 97.6°F | Resp 18 | Ht 59.0 in | Wt 121.2 lb

## 2021-05-21 DIAGNOSIS — C8280 Other types of follicular lymphoma, unspecified site: Secondary | ICD-10-CM

## 2021-05-21 DIAGNOSIS — D72829 Elevated white blood cell count, unspecified: Secondary | ICD-10-CM | POA: Diagnosis not present

## 2021-05-21 DIAGNOSIS — R300 Dysuria: Secondary | ICD-10-CM | POA: Diagnosis not present

## 2021-05-21 DIAGNOSIS — D72828 Other elevated white blood cell count: Secondary | ICD-10-CM

## 2021-05-21 DIAGNOSIS — D649 Anemia, unspecified: Secondary | ICD-10-CM | POA: Diagnosis not present

## 2021-05-21 LAB — CBC AND DIFFERENTIAL
HCT: 48 — AB (ref 36–46)
Hemoglobin: 15.8 (ref 12.0–16.0)
Neutrophils Absolute: 9.36
Platelets: 313 (ref 150–399)
WBC: 11.7

## 2021-05-21 LAB — BASIC METABOLIC PANEL
BUN: 20 (ref 4–21)
CO2: 23 — AB (ref 13–22)
Chloride: 106 (ref 99–108)
Creatinine: 1.2 — AB (ref 0.5–1.1)
Glucose: 120
Potassium: 3.8 (ref 3.4–5.3)
Sodium: 141 (ref 137–147)

## 2021-05-21 LAB — COMPREHENSIVE METABOLIC PANEL
Albumin: 4.4 (ref 3.5–5.0)
Calcium: 8.9 (ref 8.7–10.7)

## 2021-05-21 LAB — HEPATIC FUNCTION PANEL
ALT: 23 (ref 7–35)
AST: 28 (ref 13–35)
Alkaline Phosphatase: 56 (ref 25–125)
Bilirubin, Total: 0.7

## 2021-05-21 LAB — CBC: RBC: 5.28 — AB (ref 3.87–5.11)

## 2021-05-21 NOTE — Assessment & Plan Note (Signed)
New leukocytosis, with elevated neutrophils neutrophils.  No abnormal appearing white blood cells.  She does not have any symptoms of infection.  I will check a urinalysis and urine culture to be sure she does not have asymptomatic urinary tract infection.

## 2021-05-21 NOTE — Telephone Encounter (Signed)
Per 05/21/21 los next appt confirmed with patient

## 2021-05-22 ENCOUNTER — Telehealth: Payer: Self-pay

## 2021-05-22 ENCOUNTER — Encounter: Payer: Self-pay | Admitting: Hematology and Oncology

## 2021-05-22 NOTE — Telephone Encounter (Addendum)
I notified Katharine Look, caregiver for pt, that the sample was contaminated. Pt would need to be able to clean herself well w/towelettes 3 times (wiping front to back) and give good clean catch sample. She mentions they will do their best, as pt has dementia. ? ?----- Message from Marvia Pickles, PA-C sent at 05/22/2021 11:03 AM EST ----- ?Regarding: RE: Asking if needs to come back to give more urine for sample? ?I responded to an earlier message, urine contaminated, would have to do a good clean catch if they want to bring in another sample. ?----- Message ----- ?From: Dairl Ponder, RN ?Sent: 05/22/2021  10:41 AM EST ?To: Marquis Lunch, PA-C ?Subject: RE: Asking if needs to come back to give mor# ? ?She would give the sample while she is here, if you would like her to come back ?----- Message ----- ?From: Marvia Pickles, PA-C ?Sent: 05/22/2021  10:37 AM EST ?To: Dairl Ponder, RN, Wyline Copas ?Subject: RE: Asking if needs to come back to give mor# ? ?Urine culture reveals contaminants. She would need to do a good clean catch if she is bringing in another sample. Thanks! ?----- Message ----- ?From: Dairl Ponder, RN ?Sent: 05/22/2021  10:01 AM EST ?To: Marquis Lunch, PA-C ?Subject: Asking if needs to come back to give more ur# ? ?Pt's caregiver, Katharine Look, has called to ask if she needs to bring [facility] in to give another urine sample or not? She states she didn't have a lot of urine the day she was here? ? ? ? ? ?

## 2021-05-23 ENCOUNTER — Other Ambulatory Visit: Payer: Self-pay | Admitting: Hematology and Oncology

## 2021-05-23 ENCOUNTER — Inpatient Hospital Stay: Payer: Medicare Other | Attending: Hematology and Oncology

## 2021-05-23 ENCOUNTER — Encounter: Payer: Self-pay | Admitting: Oncology

## 2021-05-23 DIAGNOSIS — C8280 Other types of follicular lymphoma, unspecified site: Secondary | ICD-10-CM | POA: Diagnosis not present

## 2021-05-23 DIAGNOSIS — D649 Anemia, unspecified: Secondary | ICD-10-CM | POA: Diagnosis not present

## 2021-05-23 DIAGNOSIS — D72829 Elevated white blood cell count, unspecified: Secondary | ICD-10-CM

## 2021-05-23 DIAGNOSIS — D510 Vitamin B12 deficiency anemia due to intrinsic factor deficiency: Secondary | ICD-10-CM | POA: Diagnosis not present

## 2021-05-27 ENCOUNTER — Other Ambulatory Visit: Payer: Self-pay | Admitting: Hematology and Oncology

## 2021-05-27 MED ORDER — CIPROFLOXACIN HCL 250 MG PO TABS: 250.0000 mg | ORAL_TABLET | Freq: Two times a day (BID) | ORAL | 0 refills | Status: DC

## 2021-05-27 NOTE — Progress Notes (Signed)
Patient had leukocytosis at visit without s/s of infection. First urine culture was contaminated. Second culture revealed E. Coli sensitive to all usually drugs. She is allergic to penicillin and sulfa. Per her caregiver [name], she remains asymptomatic, so will prescribe Cipro 250 mg bid for 3 days. Caregiver verbalizes understanding. ?

## 2021-07-03 DIAGNOSIS — D510 Vitamin B12 deficiency anemia due to intrinsic factor deficiency: Secondary | ICD-10-CM | POA: Diagnosis not present

## 2021-08-20 DIAGNOSIS — D510 Vitamin B12 deficiency anemia due to intrinsic factor deficiency: Secondary | ICD-10-CM | POA: Diagnosis not present

## 2021-10-01 DIAGNOSIS — D510 Vitamin B12 deficiency anemia due to intrinsic factor deficiency: Secondary | ICD-10-CM | POA: Diagnosis not present

## 2021-11-05 DIAGNOSIS — E860 Dehydration: Secondary | ICD-10-CM | POA: Diagnosis not present

## 2021-11-05 DIAGNOSIS — I951 Orthostatic hypotension: Secondary | ICD-10-CM | POA: Diagnosis not present

## 2021-11-05 DIAGNOSIS — R1111 Vomiting without nausea: Secondary | ICD-10-CM | POA: Diagnosis not present

## 2021-11-06 DIAGNOSIS — D510 Vitamin B12 deficiency anemia due to intrinsic factor deficiency: Secondary | ICD-10-CM | POA: Diagnosis not present

## 2021-11-11 DIAGNOSIS — D751 Secondary polycythemia: Secondary | ICD-10-CM | POA: Diagnosis not present

## 2021-11-11 DIAGNOSIS — Z6824 Body mass index (BMI) 24.0-24.9, adult: Secondary | ICD-10-CM | POA: Diagnosis not present

## 2021-11-11 DIAGNOSIS — N189 Chronic kidney disease, unspecified: Secondary | ICD-10-CM | POA: Diagnosis not present

## 2021-11-11 DIAGNOSIS — I10 Essential (primary) hypertension: Secondary | ICD-10-CM | POA: Diagnosis not present

## 2021-12-17 DIAGNOSIS — D510 Vitamin B12 deficiency anemia due to intrinsic factor deficiency: Secondary | ICD-10-CM | POA: Diagnosis not present

## 2021-12-26 DIAGNOSIS — Z23 Encounter for immunization: Secondary | ICD-10-CM | POA: Diagnosis not present

## 2022-01-16 DIAGNOSIS — D510 Vitamin B12 deficiency anemia due to intrinsic factor deficiency: Secondary | ICD-10-CM | POA: Diagnosis not present

## 2022-02-17 DIAGNOSIS — D510 Vitamin B12 deficiency anemia due to intrinsic factor deficiency: Secondary | ICD-10-CM | POA: Diagnosis not present

## 2022-03-20 DIAGNOSIS — D510 Vitamin B12 deficiency anemia due to intrinsic factor deficiency: Secondary | ICD-10-CM | POA: Diagnosis not present

## 2022-04-10 DIAGNOSIS — Z0000 Encounter for general adult medical examination without abnormal findings: Secondary | ICD-10-CM | POA: Diagnosis not present

## 2022-04-10 DIAGNOSIS — N390 Urinary tract infection, site not specified: Secondary | ICD-10-CM | POA: Diagnosis not present

## 2022-04-10 DIAGNOSIS — I951 Orthostatic hypotension: Secondary | ICD-10-CM | POA: Diagnosis not present

## 2022-04-10 DIAGNOSIS — I10 Essential (primary) hypertension: Secondary | ICD-10-CM | POA: Diagnosis not present

## 2022-05-05 DIAGNOSIS — D510 Vitamin B12 deficiency anemia due to intrinsic factor deficiency: Secondary | ICD-10-CM | POA: Diagnosis not present

## 2022-05-19 NOTE — Progress Notes (Signed)
[facility]  [address] [place],  [place]  [id number] [phone]  Clinic Day: 05/21/22  Referring physician: [facility]  CHIEF COMPLAINT:  CC: History of stage III B-cell lymphoma, low-grade follicular type   Current Treatment:  Observation   HISTORY OF PRESENT ILLNESS:  [name] is a 89 y.o. female with a history of stage III B-cell lymphoma, low-grade follicular type diagnosed in April 2013. We began seeing in April 2017 when she transferred her care here.  Initial PET scan revealed adenopathy of the axillary and subpectoral areas, as well as retroperitoneal and pelvic adenopathy.  There were liver lesions seen, which were not hypermetabolic.  She was treated with CVP/Rituxan from June 2013 to September 2013 and had a near complete response.   She then developed a bowel perforation and had a prolonged hospitalization for a perforated diverticulum with peritonitis and sepsis.  She even required ventilator support and tracheostomy, but eventually recovered.  She has had no further therapy for her lymphoma.  She has not had evidence of progressive disease.  Unfortunately, she has had chronic abdominal pain since her bowel surgery, felt to be most likely secondary to adhesions.  She also has compression fractures at T9 and T10 and has had kyphoplasty to these areas.  She has multiple comorbidities including hypertension, TIA's, osteoarthritis, osteoporosis, and prior atrial fibrillation.  She intermittently seeks medical care for increases in her abdominal pain.  The most recent CT abdomen and pelvis in February did not reveal any progressive lymphoma.    INTERVAL HISTORY:  [name] is here for routine follow up hiistory of stage III B-cell lymphoma, low-grade follicular type. Patient states that she is well and has no complaints. She has worsening short term memory and worsening dementia. She is very quiet today which is unusual for  her. Her BP today is 98/62 and last visit was 126/65. She denies dizziness. I advised her to drink more fluids to stay hydrated. Her labs today are pending. She will have her port flushed today. Her caregiver informed me that she went to the ER about 5 months ago for extremely low BP and they gave her IV fluids. Her caregiver was wondering if she can receive IV fluids today or soon. I will schedule her to receive 500 CC's today at 2 o'clock, but want to be cautious at her age not to fluid overload her. I will see her back in 6 months with CBC, CMP, and a port flush. I informed them that if she has no other urgent issues going on then they can call and schedule IV fluids with [place] again. She denies signs of infection such as sore throat, sinus drainage, cough, or urinary symptoms.  She denies fevers or recurrent chills. She denies pain. She denies nausea, vomiting, chest pain, dyspnea or cough. Her appetite hasn't been the best and her weight has decreased 1 pounds over last year . She is accompanied at today's visit with 2 of her good neighbors and a caregiver over the phone.   REVIEW OF SYSTEMS:  Review of Systems  Constitutional: Negative.  Negative for appetite change, chills, diaphoresis, fatigue, fever and unexpected weight change.  HENT:  Negative.  Negative for hearing loss, lump/mass, mouth sores, nosebleeds, sore throat, tinnitus, trouble swallowing and voice change.   Eyes: Negative.  Negative for eye problems and icterus.  Respiratory: Negative.  Negative for chest tightness, cough, hemoptysis, shortness of breath and wheezing.   Cardiovascular: Negative.  Negative for chest pain, leg swelling and palpitations.  Gastrointestinal: Negative.  Negative for abdominal distention, abdominal pain, blood in stool, constipation, diarrhea, nausea, rectal pain and vomiting.  Endocrine: Negative.   Genitourinary: Negative.  Negative for bladder incontinence, difficulty urinating, dyspareunia, dysuria,  frequency, hematuria, menstrual problem, nocturia, pelvic pain, vaginal bleeding and vaginal discharge.   Musculoskeletal: Negative.  Negative for arthralgias, back pain, flank pain, gait problem, myalgias, neck pain and neck stiffness.  Skin: Negative.  Negative for itching, rash and wound.  Neurological: Negative.  Negative for dizziness, extremity weakness, gait problem, headaches, light-headedness, numbness, seizures and speech difficulty.  Hematological: Negative.  Negative for adenopathy. Does not bruise/bleed easily.  Psychiatric/Behavioral:  Positive for confusion (worsening short term memory). Negative for decreased concentration, depression, sleep disturbance and suicidal ideas. The patient is not nervous/anxious.      VITALS:  Blood pressure 98/62, pulse 67, temperature 97.8 F (36.6 C), temperature source Oral, resp. rate 18, height '4\' 11"'$  (1.499 m), weight 119 lb 14.4 oz (54.4 kg), SpO2 100 %.  Wt Readings from Last 3 Encounters:  05/21/22 121 lb (54.9 kg)  05/21/22 119 lb 14.4 oz (54.4 kg)  05/21/21 121 lb 3.2 oz (55 kg)    Body mass index is 24.22 kg/m.  Performance status (ECOG): 1 - Symptomatic but completely ambulatory  PHYSICAL EXAM:  Physical Exam Vitals and nursing note reviewed.  Constitutional:      General: She is not in acute distress.    Appearance: Normal appearance. She is normal weight. She is not ill-appearing, toxic-appearing or diaphoretic.  HENT:     Head: Normocephalic and atraumatic.     Right Ear: Tympanic membrane, ear canal and external ear normal. There is no impacted cerumen.     Left Ear: Tympanic membrane, ear canal and external ear normal. There is no impacted cerumen.     Nose: Nose normal. No congestion or rhinorrhea.     Mouth/Throat:     Mouth: Mucous membranes are moist.     Pharynx: Oropharynx is clear. No oropharyngeal exudate or posterior oropharyngeal erythema.  Eyes:     General: No scleral icterus.       Right eye: No  discharge.        Left eye: No discharge.     Extraocular Movements: Extraocular movements intact.     Conjunctiva/sclera: Conjunctivae normal.     Pupils: Pupils are equal, round, and reactive to light.  Neck:     Vascular: No carotid bruit.  Cardiovascular:     Rate and Rhythm: Normal rate and regular rhythm.     Pulses: Normal pulses.     Heart sounds: Normal heart sounds. No murmur heard.    No friction rub. No gallop.  Pulmonary:     Effort: Pulmonary effort is normal. No respiratory distress.     Breath sounds: Normal breath sounds. No stridor. No wheezing, rhonchi or rales.  Chest:     Chest wall: No tenderness.  Abdominal:     General: Bowel sounds are normal. There is no distension.     Palpations: Abdomen is soft. There is no hepatomegaly, splenomegaly or mass.     Tenderness: There is no abdominal tenderness. There is no right CVA tenderness, left CVA tenderness, guarding or rebound.     Hernia: No hernia is present.  Musculoskeletal:        General: No swelling, tenderness, deformity or signs of injury. Normal range of motion.     Cervical back: Normal range  of motion and neck supple. No rigidity or tenderness.     Right lower leg: No edema.     Left lower leg: No edema.  Lymphadenopathy:     Cervical: No cervical adenopathy.  Skin:    General: Skin is warm and dry.     Coloration: Skin is not jaundiced or pale.     Findings: No bruising, erythema, lesion or rash.     Comments: Decreased skin turgor.   Neurological:     General: No focal deficit present.     Mental Status: She is alert. Mental status is at baseline. She is disoriented.     Cranial Nerves: No cranial nerve deficit.     Sensory: No sensory deficit.     Motor: No weakness.     Coordination: Coordination normal.     Gait: Gait normal.     Deep Tendon Reflexes: Reflexes normal.  Psychiatric:        Mood and Affect: Mood normal.        Behavior: Behavior normal.        Thought Content: Thought  content normal.        Judgment: Judgment normal.    LABS:      Latest Ref Rng & Units 05/21/2022   11:09 AM 05/21/2021   12:00 AM 11/20/2020   12:00 AM  CBC  WBC 4.0 - 10.5 K/uL 8.3  11.7  7.2      Hemoglobin 12.0 - 15.0 g/dL 15.7  15.8  15.0      Hematocrit 36.0 - 46.0 % 48.3  48  42      Platelets 150 - 400 K/uL 336  313  262         This result is from an external source.      Latest Ref Rng & Units 05/21/2022   11:09 AM 05/21/2021   12:00 AM 11/20/2020   12:00 AM  CMP  Glucose 70 - 99 mg/dL 106     BUN 8 - 23 mg/dL '18  20     15      '$ Creatinine 0.44 - 1.00 mg/dL 1.63  1.2     1.3      Sodium 135 - 145 mmol/L 139  141     137      Potassium 3.5 - 5.1 mmol/L 4.0  3.8     3.9      Chloride 98 - 111 mmol/L 102  106     104      CO2 22 - 32 mmol/L '26  23     22      '$ Calcium 8.9 - 10.3 mg/dL 9.5  8.9     8.4      Total Protein 6.5 - 8.1 g/dL 7.2     Total Bilirubin 0.3 - 1.2 mg/dL 0.7     Alkaline Phos 38 - 126 U/L 80  56     61      AST 15 - 41 U/L '27  28     30      '$ ALT 0 - 44 U/L '16  23     16         '$ This result is from an external source.   Lab Results  Component Value Date   TIBC 171 (L) 12/23/2011   FERRITIN 837 (H) 12/23/2011   IRONPCTSAT 9 (L) 12/23/2011   Lab Results  Component Value Date   LDH 172 05/21/2022   LDH 203 12/04/2014  LDH 202 12/22/2013   STUDIES:  No results found.    HISTORY:   Allergies:  Allergies  Allergen Reactions   Codeine Hives and Other (See Comments)   Lisinopril Other (See Comments)    Other reaction(s): Other (See Comments)  UNKNOWN   Ibuprofen Other (See Comments)    Other reaction(s): Other (See Comments)  Contraindicated   Penicillins Hives   Sulfa Antibiotics Hives   Sulfasalazine Hives   Other Nausea And Vomiting and Rash    anesthesia  Itching and burning   Current Medications: Current Outpatient Medications  Medication Sig Dispense Refill   ALPRAZolam (XANAX) 0.25 MG tablet Take 0.125-0.25 mg by  mouth daily as needed.     amLODipine (NORVASC) 5 MG tablet Take 5 mg by mouth daily.     atorvastatin (LIPITOR) 10 MG tablet Take 10 mg by mouth at bedtime.     clopidogrel (PLAVIX) 75 MG tablet Take 75 mg by mouth daily.     Cyanocobalamin (B-12 PO) Take by mouth 2 (two) times a week.     DULoxetine (CYMBALTA) 60 MG capsule Take 60 mg by mouth daily.     loratadine (CLARITIN) 10 MG tablet Take 10 mg by mouth daily as needed for allergies.     metoprolol (LOPRESSOR) 50 MG tablet Take 50 mg by mouth 2 (two) times daily.     acetaminophen (TYLENOL) 500 MG tablet Take 500 mg by mouth daily.     denosumab (PROLIA) 60 MG/ML SOSY injection Inject into the skin.     No current facility-administered medications for this visit.   ASSESSMENT & PLAN:   Assessment:   1. History of stage III follicular lymphoma.  She remains without obvious evidence of recurrent/progressive disease.  2. Dehydration, in view of her age and size, I will only give her 500 CC's.  3. Chronic kidney disease, which has only mildly improved.     4. Lymphopenia, chronic from prior treatment for her lymphoma.  Plan: She will have her port flushed today and receive 500 CC's today at 2 o'clock. I will see her back in 6 months with CBC, CMP, and a port flush. I informed them that if she has no other urgent issues going on then they can call and schedule IV fluids with [place] again. She will continue with her primary care physician.  The patient and her caretaker verbalize understanding of and agreement to the plans discussed today. They know to call the office should any new questions or concerns arise.   I provided 15 minutes of face-to-face time during this this encounter and > 50% was spent counseling as documented under my assessment and plan.    I,Jasmine M Lassiter,acting as a scribe for Derwood Kaplan, MD.,have documented all relevant documentation on the behalf of Derwood Kaplan, MD,as directed by  Derwood Kaplan, MD while in the presence of Derwood Kaplan, MD.

## 2022-05-21 ENCOUNTER — Telehealth: Payer: Self-pay | Admitting: Oncology

## 2022-05-21 ENCOUNTER — Encounter: Payer: Self-pay | Admitting: Oncology

## 2022-05-21 ENCOUNTER — Inpatient Hospital Stay: Payer: Medicare Other | Attending: Oncology | Admitting: Oncology

## 2022-05-21 ENCOUNTER — Other Ambulatory Visit: Payer: Self-pay | Admitting: Oncology

## 2022-05-21 ENCOUNTER — Inpatient Hospital Stay: Payer: Medicare Other

## 2022-05-21 ENCOUNTER — Encounter: Payer: Self-pay | Admitting: Hematology and Oncology

## 2022-05-21 VITALS — BP 98/62 | HR 67 | Temp 97.8°F | Resp 18 | Ht 59.0 in | Wt 119.9 lb

## 2022-05-21 VITALS — BP 104/70 | HR 70 | Resp 16 | Wt 121.0 lb

## 2022-05-21 DIAGNOSIS — C8280 Other types of follicular lymphoma, unspecified site: Secondary | ICD-10-CM

## 2022-05-21 DIAGNOSIS — F0390 Unspecified dementia without behavioral disturbance: Secondary | ICD-10-CM | POA: Diagnosis not present

## 2022-05-21 DIAGNOSIS — Z8571 Personal history of Hodgkin lymphoma: Secondary | ICD-10-CM | POA: Insufficient documentation

## 2022-05-21 DIAGNOSIS — E860 Dehydration: Secondary | ICD-10-CM | POA: Insufficient documentation

## 2022-05-21 LAB — CMP (CANCER CENTER ONLY)
ALT: 16 U/L (ref 0–44)
AST: 27 U/L (ref 15–41)
Albumin: 4.3 g/dL (ref 3.5–5.0)
Alkaline Phosphatase: 80 U/L (ref 38–126)
Anion gap: 11 (ref 5–15)
BUN: 18 mg/dL (ref 8–23)
CO2: 26 mmol/L (ref 22–32)
Calcium: 9.5 mg/dL (ref 8.9–10.3)
Chloride: 102 mmol/L (ref 98–111)
Creatinine: 1.63 mg/dL — ABNORMAL HIGH (ref 0.44–1.00)
GFR, Estimated: 30 mL/min — ABNORMAL LOW (ref >60)
Glucose, Bld: 106 mg/dL — ABNORMAL HIGH (ref 70–99)
Potassium: 4 mmol/L (ref 3.5–5.1)
Sodium: 139 mmol/L (ref 135–145)
Total Bilirubin: 0.7 mg/dL (ref 0.3–1.2)
Total Protein: 7.2 g/dL (ref 6.5–8.1)

## 2022-05-21 LAB — CBC WITH DIFFERENTIAL (CANCER CENTER ONLY)
Abs Immature Granulocytes: 0.04 10*3/uL (ref 0.00–0.07)
Basophils Absolute: 0 10*3/uL (ref 0.0–0.1)
Basophils Relative: 0 %
Eosinophils Absolute: 0 10*3/uL (ref 0.0–0.5)
Eosinophils Relative: 0 %
HCT: 48.3 % — ABNORMAL HIGH (ref 36.0–46.0)
Hemoglobin: 15.7 g/dL — ABNORMAL HIGH (ref 12.0–15.0)
Immature Granulocytes: 1 %
Lymphocytes Relative: 22 %
Lymphs Abs: 1.8 10*3/uL (ref 0.7–4.0)
MCH: 30 pg (ref 26.0–34.0)
MCHC: 32.5 g/dL (ref 30.0–36.0)
MCV: 92.2 fL (ref 80.0–100.0)
Monocytes Absolute: 0.8 10*3/uL (ref 0.1–1.0)
Monocytes Relative: 9 %
Neutro Abs: 5.7 10*3/uL (ref 1.7–7.7)
Neutrophils Relative %: 68 %
Platelet Count: 336 10*3/uL (ref 150–400)
RBC: 5.24 MIL/uL — ABNORMAL HIGH (ref 3.87–5.11)
RDW: 12.9 % (ref 11.5–15.5)
WBC Count: 8.3 10*3/uL (ref 4.0–10.5)
nRBC: 0 % (ref 0.0–0.2)

## 2022-05-21 LAB — LACTATE DEHYDROGENASE: LDH: 172 U/L (ref 98–192)

## 2022-05-21 MED ORDER — SODIUM CHLORIDE 0.9% FLUSH
10.0000 mL | INTRAVENOUS | Status: DC | PRN
  Administered 2022-05-21: 10 mL

## 2022-05-21 MED ORDER — SODIUM CHLORIDE 0.9 % IV SOLN: Freq: Once | INTRAVENOUS | Status: AC

## 2022-05-21 MED ORDER — HEPARIN SOD (PORK) LOCK FLUSH 100 UNIT/ML IV SOLN
500.0000 [IU] | Freq: Once | INTRAVENOUS | Status: AC | PRN
  Administered 2022-05-21: 500 [IU]

## 2022-05-21 NOTE — Telephone Encounter (Signed)
05/21/22 Next appt scheduled and confirmed with patient

## 2022-05-21 NOTE — Addendum Note (Signed)
Addended by: Juanetta Beets on: 05/21/2022 11:49 AM   Modules accepted: Orders

## 2022-05-21 NOTE — Patient Instructions (Signed)
Dehydration, Adult Dehydration is a condition in which there is not enough water or other fluids in the body. This happens when a person loses more fluids than they take in. Important organs cannot work right without the right amount of fluids. Any loss of fluids from the body can cause dehydration. Dehydration can be mild, worse, or very bad. It should be treated right away to keep it from getting very bad. What are the causes? Conditions that cause loss of water in the body. They include: Watery poop (diarrhea). Vomiting. Sweating a lot. Fever. Infection. Peeing (urinating) a lot. Not drinking enough fluids. Certain medicines, such as medicines that take extra fluid out of the body (diuretics). Lack of safe drinking water. Not being able to get enough water and food. What increases the risk? Having a long-term (chronic) illness that has not been treated the right way, such as: Diabetes. Heart disease. Kidney disease. Being 71 years of age or older. Having a disability. Living in a place that is high above the ground or sea (high in altitude). The thinner, drier air causes more fluid loss. Doing exercises that put stress on your body for a long time. Being active when in hot places. What are the signs or symptoms? Symptoms of dehydration depend on how bad it is. Mild or worse dehydration Thirst. Dry lips or dry mouth. Feeling dizzy or light-headed. Muscle cramps. Passing little pee or dark pee. Pee may be the color of tea. Headache. Very bad dehydration Changes in skin. Skin may: Be cold to the touch (clammy). Be blotchy or pale. Not go back to normal right after you pinch it and let it go. Little or no tears, pee, or sweat. Fast breathing. Low blood pressure. Weak pulse. Pulse that is more than 100 beats a minute when you are sitting still. Other changes, such as: Feeling very thirsty. Eyes that look hollow (sunken). Cold hands and feet. Being confused. Being very  tired (lethargic) or having trouble waking from sleep. Losing weight. Loss of consciousness. How is this treated? Treatment for this condition depends on how bad your dehydration is. Treatment should start right away. Do not wait until your condition gets very bad. Very bad dehydration is an emergency. You will need to go to a hospital. Mild or worse dehydration can be treated at home. You may be asked to: Drink more fluids. Drink an oral rehydration solution (ORS). This drink gives you the right amount of fluids, salts, and minerals (electrolytes). Very bad dehydration can be treated: With fluids through an IV tube. By correcting low levels of electrolytes in the body. By treating the problem that caused your dehydration. Follow these instructions at home: Oral rehydration solution If told by your doctor, drink an ORS: Make an ORS. Use instructions on the package. Start by drinking small amounts, about  cup (120 mL) every 5-10 minutes. Slowly drink more until you have had the amount that your doctor said to have.  Eating and drinking  Drink enough clear fluid to keep your pee pale yellow. If you were told to drink an ORS, finish the ORS first. Then, start slowly drinking other clear fluids. Drink fluids such as: Water. Do not drink only water. Doing that can make the salt (sodium) level in your body get too low. Water from ice chips you suck on. Fruit juice that you have added water to (diluted). Low-calorie sports drinks. Eat foods that have the right amounts of salts and minerals, such as bananas, oranges, potatoes,  tomatoes, or spinach. Do not drink alcohol. Avoid drinks that have caffeine or sugar. These include:: High-calorie sports drinks. Fruit juice that you did not add water to. Soda. Coffee or energy drinks. Avoid foods that are greasy or have a lot of fat or sugar. General instructions Take over-the-counter and prescription medicines only as told by your doctor. Do  not take sodium tablets. Doing that can make the salt level in your body get too high. Return to your normal activities as told by your doctor. Ask your doctor what activities are safe for you. Keep all follow-up visits. Your doctor may check and change your treatment. Contact a doctor if: You have pain in your belly (abdomen) and the pain: Gets worse. Stays in one place. You have a rash. You have a stiff neck. You get angry or annoyed more easily than normal. You are more tired or have a harder time waking than normal. You feel weak or dizzy. You feel very thirsty. Get help right away if: You have any symptoms of very bad dehydration. You vomit every time you eat or drink. Your vomiting gets worse, does not go away, or you vomit blood or green stuff. You are getting treatment, but symptoms are getting worse. You have a fever. You have a very bad headache. You have: Diarrhea that gets worse or does not go away. Blood in your poop (stool). This may cause poop to look black and tarry. No pee in 6-8 hours. Only a small amount of pee in 6-8 hours, and the pee is very dark. You have trouble breathing. These symptoms may be an emergency. Get help right away. Call 911. Do not wait to see if the symptoms will go away. Do not drive yourself to the hospital. This information is not intended to replace advice given to you by your health care provider. Make sure you discuss any questions you have with your health care provider. Document Revised: 10/07/2021 Document Reviewed: 10/07/2021 Elsevier Patient Education  [place].

## 2022-05-22 ENCOUNTER — Encounter: Payer: Self-pay | Admitting: Hematology and Oncology

## 2022-05-27 ENCOUNTER — Telehealth: Payer: Self-pay

## 2022-05-27 NOTE — Telephone Encounter (Signed)
Faxed to PCP

## 2022-05-27 NOTE — Telephone Encounter (Signed)
Patients care giver aware and voiced understanding. Care giver spoke with triage here earlier and care giver  will notifty PCP. See previous messages.

## 2022-05-27 NOTE — Telephone Encounter (Signed)
Received call from Julienne Kass, caregiver for pt. She states pt's blood pressure is low, 72/58, pulse 47. She has not given her Norvasc. She is asking if she needs more fluids. See PCP? please advise.

## 2022-05-27 NOTE — Telephone Encounter (Signed)
-----   Message from Belva Chimes, LPN sent at [id number] 11:17 AM EST ----- Regarding: FW: call  ----- Message ----- From: Derwood Kaplan, MD Sent: 05/23/2022   7:03 PM EST To: Belva Chimes, LPN Subject: call                                           Tell her labs look good but kidneys not working so well, rec increase fluids, send copy to PCP

## 2022-06-02 ENCOUNTER — Encounter: Payer: Self-pay | Admitting: Hematology and Oncology

## 2022-06-02 DIAGNOSIS — D519 Vitamin B12 deficiency anemia, unspecified: Secondary | ICD-10-CM | POA: Diagnosis not present

## 2022-06-16 DIAGNOSIS — M19011 Primary osteoarthritis, right shoulder: Secondary | ICD-10-CM | POA: Diagnosis not present

## 2022-07-03 DIAGNOSIS — D519 Vitamin B12 deficiency anemia, unspecified: Secondary | ICD-10-CM | POA: Diagnosis not present

## 2022-07-17 ENCOUNTER — Encounter: Payer: Self-pay | Admitting: Hematology and Oncology

## 2022-07-17 NOTE — Telephone Encounter (Signed)
Done

## 2022-07-30 DIAGNOSIS — E876 Hypokalemia: Secondary | ICD-10-CM | POA: Diagnosis present

## 2022-07-30 DIAGNOSIS — Z043 Encounter for examination and observation following other accident: Secondary | ICD-10-CM | POA: Diagnosis not present

## 2022-07-30 DIAGNOSIS — F05 Delirium due to known physiological condition: Secondary | ICD-10-CM | POA: Diagnosis not present

## 2022-07-30 DIAGNOSIS — I4891 Unspecified atrial fibrillation: Secondary | ICD-10-CM | POA: Diagnosis not present

## 2022-07-30 DIAGNOSIS — M25552 Pain in left hip: Secondary | ICD-10-CM | POA: Diagnosis not present

## 2022-07-30 DIAGNOSIS — Z880 Allergy status to penicillin: Secondary | ICD-10-CM | POA: Diagnosis not present

## 2022-07-30 DIAGNOSIS — Z66 Do not resuscitate: Secondary | ICD-10-CM | POA: Diagnosis present

## 2022-07-30 DIAGNOSIS — Z7902 Long term (current) use of antithrombotics/antiplatelets: Secondary | ICD-10-CM | POA: Diagnosis not present

## 2022-07-30 DIAGNOSIS — S72142A Displaced intertrochanteric fracture of left femur, initial encounter for closed fracture: Secondary | ICD-10-CM | POA: Diagnosis not present

## 2022-07-30 DIAGNOSIS — N179 Acute kidney failure, unspecified: Secondary | ICD-10-CM | POA: Diagnosis not present

## 2022-07-30 DIAGNOSIS — D62 Acute posthemorrhagic anemia: Secondary | ICD-10-CM | POA: Diagnosis not present

## 2022-07-30 DIAGNOSIS — S72002D Fracture of unspecified part of neck of left femur, subsequent encounter for closed fracture with routine healing: Secondary | ICD-10-CM | POA: Diagnosis not present

## 2022-07-30 DIAGNOSIS — E7800 Pure hypercholesterolemia, unspecified: Secondary | ICD-10-CM | POA: Diagnosis not present

## 2022-07-30 DIAGNOSIS — S72145A Nondisplaced intertrochanteric fracture of left femur, initial encounter for closed fracture: Secondary | ICD-10-CM | POA: Diagnosis not present

## 2022-07-30 DIAGNOSIS — Z8572 Personal history of non-Hodgkin lymphomas: Secondary | ICD-10-CM | POA: Diagnosis not present

## 2022-07-30 DIAGNOSIS — J690 Pneumonitis due to inhalation of food and vomit: Secondary | ICD-10-CM | POA: Diagnosis not present

## 2022-07-30 DIAGNOSIS — Z882 Allergy status to sulfonamides status: Secondary | ICD-10-CM | POA: Diagnosis not present

## 2022-07-30 DIAGNOSIS — Z79899 Other long term (current) drug therapy: Secondary | ICD-10-CM | POA: Diagnosis not present

## 2022-07-30 DIAGNOSIS — I491 Atrial premature depolarization: Secondary | ICD-10-CM | POA: Diagnosis not present

## 2022-07-30 DIAGNOSIS — I480 Paroxysmal atrial fibrillation: Secondary | ICD-10-CM | POA: Diagnosis not present

## 2022-07-30 DIAGNOSIS — Z9181 History of falling: Secondary | ICD-10-CM | POA: Diagnosis not present

## 2022-07-30 DIAGNOSIS — I10 Essential (primary) hypertension: Secondary | ICD-10-CM | POA: Diagnosis not present

## 2022-07-30 DIAGNOSIS — R9431 Abnormal electrocardiogram [ECG] [EKG]: Secondary | ICD-10-CM | POA: Diagnosis not present

## 2022-07-30 DIAGNOSIS — W19XXXA Unspecified fall, initial encounter: Secondary | ICD-10-CM | POA: Diagnosis not present

## 2022-07-30 DIAGNOSIS — G309 Alzheimer's disease, unspecified: Secondary | ICD-10-CM | POA: Diagnosis present

## 2022-07-30 DIAGNOSIS — M1990 Unspecified osteoarthritis, unspecified site: Secondary | ICD-10-CM | POA: Diagnosis not present

## 2022-07-30 DIAGNOSIS — R0602 Shortness of breath: Secondary | ICD-10-CM | POA: Diagnosis not present

## 2022-07-30 DIAGNOSIS — Z515 Encounter for palliative care: Secondary | ICD-10-CM | POA: Diagnosis not present

## 2022-07-30 DIAGNOSIS — J9811 Atelectasis: Secondary | ICD-10-CM | POA: Diagnosis not present

## 2022-07-30 DIAGNOSIS — M79652 Pain in left thigh: Secondary | ICD-10-CM | POA: Diagnosis not present

## 2022-07-30 DIAGNOSIS — G311 Senile degeneration of brain, not elsewhere classified: Secondary | ICD-10-CM | POA: Diagnosis not present

## 2022-07-30 DIAGNOSIS — K219 Gastro-esophageal reflux disease without esophagitis: Secondary | ICD-10-CM | POA: Diagnosis present

## 2022-07-30 DIAGNOSIS — R410 Disorientation, unspecified: Secondary | ICD-10-CM | POA: Diagnosis not present

## 2022-07-30 DIAGNOSIS — F0280 Dementia in other diseases classified elsewhere without behavioral disturbance: Secondary | ICD-10-CM | POA: Diagnosis not present

## 2022-08-03 DIAGNOSIS — I491 Atrial premature depolarization: Secondary | ICD-10-CM

## 2022-08-23 DEATH — deceased

## 2022-11-19 ENCOUNTER — Other Ambulatory Visit: Payer: Medicare Other

## 2022-11-19 ENCOUNTER — Ambulatory Visit: Payer: Medicare Other | Admitting: Oncology
